# Patient Record
Sex: Male | Born: 1935
Health system: Southern US, Community
[De-identification: ages and names within clinical notes are randomized; demographics above are authoritative.]

## PROBLEM LIST (undated history)

## (undated) DIAGNOSIS — I639 Cerebral infarction, unspecified: Secondary | ICD-10-CM

## (undated) DIAGNOSIS — I10 Essential (primary) hypertension: Secondary | ICD-10-CM

## (undated) DIAGNOSIS — G4730 Sleep apnea, unspecified: Secondary | ICD-10-CM

## (undated) DIAGNOSIS — N189 Chronic kidney disease, unspecified: Secondary | ICD-10-CM

## (undated) DIAGNOSIS — E785 Hyperlipidemia, unspecified: Secondary | ICD-10-CM

## (undated) DIAGNOSIS — M1990 Unspecified osteoarthritis, unspecified site: Secondary | ICD-10-CM

## (undated) DIAGNOSIS — I499 Cardiac arrhythmia, unspecified: Secondary | ICD-10-CM

## (undated) HISTORY — PX: OTHER SURGICAL HISTORY: SHX169

## (undated) HISTORY — DX: Cerebral infarction, unspecified: I63.9

## (undated) HISTORY — PX: BACK SURGERY: SHX140

---

## 2000-04-27 ENCOUNTER — Encounter: Payer: Self-pay | Admitting: Orthopedic Surgery

## 2000-04-27 ENCOUNTER — Ambulatory Visit (HOSPITAL_COMMUNITY): Admission: RE | Admit: 2000-04-27 | Discharge: 2000-04-27 | Payer: Self-pay | Admitting: Orthopedic Surgery

## 2000-06-04 ENCOUNTER — Ambulatory Visit (HOSPITAL_COMMUNITY): Admission: RE | Admit: 2000-06-04 | Discharge: 2000-06-04 | Payer: Self-pay | Admitting: *Deleted

## 2000-06-04 ENCOUNTER — Encounter: Payer: Self-pay | Admitting: *Deleted

## 2000-06-17 ENCOUNTER — Observation Stay (HOSPITAL_COMMUNITY): Admission: RE | Admit: 2000-06-17 | Discharge: 2000-06-18 | Payer: Self-pay | Admitting: Specialist

## 2000-06-17 ENCOUNTER — Encounter (INDEPENDENT_AMBULATORY_CARE_PROVIDER_SITE_OTHER): Payer: Self-pay | Admitting: Specialist

## 2000-06-17 ENCOUNTER — Encounter: Payer: Self-pay | Admitting: Specialist

## 2000-12-14 ENCOUNTER — Encounter: Payer: Self-pay | Admitting: Internal Medicine

## 2000-12-14 ENCOUNTER — Ambulatory Visit (HOSPITAL_COMMUNITY): Admission: RE | Admit: 2000-12-14 | Discharge: 2000-12-14 | Payer: Self-pay | Admitting: Internal Medicine

## 2004-04-30 ENCOUNTER — Ambulatory Visit (HOSPITAL_COMMUNITY): Admission: RE | Admit: 2004-04-30 | Discharge: 2004-04-30 | Payer: Self-pay | Admitting: Family Medicine

## 2004-06-25 ENCOUNTER — Ambulatory Visit: Payer: Self-pay | Admitting: Internal Medicine

## 2004-06-25 ENCOUNTER — Ambulatory Visit (HOSPITAL_COMMUNITY): Admission: RE | Admit: 2004-06-25 | Discharge: 2004-06-25 | Payer: Self-pay | Admitting: Internal Medicine

## 2004-10-29 ENCOUNTER — Emergency Department (HOSPITAL_COMMUNITY): Admission: EM | Admit: 2004-10-29 | Discharge: 2004-10-29 | Payer: Self-pay | Admitting: Emergency Medicine

## 2004-11-21 ENCOUNTER — Ambulatory Visit: Payer: Self-pay | Admitting: Orthopedic Surgery

## 2004-11-29 ENCOUNTER — Ambulatory Visit: Payer: Self-pay | Admitting: Orthopedic Surgery

## 2004-11-29 ENCOUNTER — Ambulatory Visit (HOSPITAL_COMMUNITY): Admission: RE | Admit: 2004-11-29 | Discharge: 2004-11-29 | Payer: Self-pay | Admitting: Orthopedic Surgery

## 2004-12-02 ENCOUNTER — Ambulatory Visit: Payer: Self-pay | Admitting: Orthopedic Surgery

## 2004-12-09 ENCOUNTER — Ambulatory Visit: Payer: Self-pay | Admitting: Orthopedic Surgery

## 2004-12-23 ENCOUNTER — Ambulatory Visit: Payer: Self-pay | Admitting: Orthopedic Surgery

## 2004-12-24 ENCOUNTER — Encounter (HOSPITAL_COMMUNITY): Admission: RE | Admit: 2004-12-24 | Discharge: 2005-01-08 | Payer: Self-pay | Admitting: Orthopedic Surgery

## 2005-01-15 ENCOUNTER — Encounter (HOSPITAL_COMMUNITY): Admission: RE | Admit: 2005-01-15 | Discharge: 2005-02-14 | Payer: Self-pay | Admitting: Orthopedic Surgery

## 2005-02-03 ENCOUNTER — Ambulatory Visit: Payer: Self-pay | Admitting: Orthopedic Surgery

## 2006-01-07 ENCOUNTER — Encounter: Admission: RE | Admit: 2006-01-07 | Discharge: 2006-01-07 | Payer: Self-pay | Admitting: Specialist

## 2006-03-11 ENCOUNTER — Ambulatory Visit (HOSPITAL_COMMUNITY): Admission: RE | Admit: 2006-03-11 | Discharge: 2006-03-12 | Payer: Self-pay | Admitting: Specialist

## 2009-11-15 ENCOUNTER — Ambulatory Visit (HOSPITAL_COMMUNITY): Admission: RE | Admit: 2009-11-15 | Discharge: 2009-11-15 | Payer: Self-pay | Admitting: Nephrology

## 2010-04-11 ENCOUNTER — Emergency Department (HOSPITAL_COMMUNITY): Payer: Medicare Other

## 2010-04-11 ENCOUNTER — Inpatient Hospital Stay (HOSPITAL_COMMUNITY): Payer: Medicare Other

## 2010-04-11 ENCOUNTER — Inpatient Hospital Stay (HOSPITAL_COMMUNITY)
Admission: EM | Admit: 2010-04-11 | Discharge: 2010-04-14 | DRG: 872 | Disposition: A | Discharge status: Home / Self Care | Payer: Medicare Other | Attending: Internal Medicine | Admitting: Internal Medicine

## 2010-04-11 DIAGNOSIS — W19XXXA Unspecified fall, initial encounter: Secondary | ICD-10-CM | POA: Diagnosis present

## 2010-04-11 DIAGNOSIS — E860 Dehydration: Secondary | ICD-10-CM | POA: Diagnosis present

## 2010-04-11 DIAGNOSIS — E039 Hypothyroidism, unspecified: Secondary | ICD-10-CM | POA: Diagnosis present

## 2010-04-11 DIAGNOSIS — S7000XA Contusion of unspecified hip, initial encounter: Secondary | ICD-10-CM | POA: Diagnosis present

## 2010-04-11 DIAGNOSIS — I129 Hypertensive chronic kidney disease with stage 1 through stage 4 chronic kidney disease, or unspecified chronic kidney disease: Secondary | ICD-10-CM | POA: Diagnosis present

## 2010-04-11 DIAGNOSIS — N390 Urinary tract infection, site not specified: Secondary | ICD-10-CM | POA: Diagnosis present

## 2010-04-11 DIAGNOSIS — A419 Sepsis, unspecified organism: Principal | ICD-10-CM | POA: Diagnosis present

## 2010-04-11 DIAGNOSIS — N179 Acute kidney failure, unspecified: Secondary | ICD-10-CM | POA: Diagnosis present

## 2010-04-11 DIAGNOSIS — N400 Enlarged prostate without lower urinary tract symptoms: Secondary | ICD-10-CM | POA: Diagnosis present

## 2010-04-11 DIAGNOSIS — E785 Hyperlipidemia, unspecified: Secondary | ICD-10-CM | POA: Diagnosis present

## 2010-04-11 DIAGNOSIS — G4700 Insomnia, unspecified: Secondary | ICD-10-CM | POA: Diagnosis present

## 2010-04-11 DIAGNOSIS — N189 Chronic kidney disease, unspecified: Secondary | ICD-10-CM | POA: Diagnosis present

## 2010-04-11 DIAGNOSIS — E119 Type 2 diabetes mellitus without complications: Secondary | ICD-10-CM | POA: Diagnosis present

## 2010-04-11 LAB — URINALYSIS, ROUTINE W REFLEX MICROSCOPIC
Bilirubin Urine: NEGATIVE
Glucose, UA: NEGATIVE mg/dL
Ketones, ur: NEGATIVE mg/dL
Nitrite: POSITIVE — AB
Specific Gravity, Urine: 1.01 (ref 1.005–1.030)
Urobilinogen, UA: 0.2 mg/dL (ref 0.0–1.0)

## 2010-04-11 LAB — CBC
HCT: 44.8 % (ref 39.0–52.0)
MCHC: 34.4 g/dL (ref 30.0–36.0)
MCV: 91.4 fL (ref 78.0–100.0)
RDW: 12.5 % (ref 11.5–15.5)

## 2010-04-11 LAB — DIFFERENTIAL
Eosinophils Absolute: 0 10*3/uL (ref 0.0–0.7)
Eosinophils Relative: 0 % (ref 0–5)
Lymphocytes Relative: 3 % — ABNORMAL LOW (ref 12–46)
Lymphs Abs: 0.5 10*3/uL — ABNORMAL LOW (ref 0.7–4.0)
Monocytes Absolute: 1.4 10*3/uL — ABNORMAL HIGH (ref 0.1–1.0)

## 2010-04-11 LAB — URINE MICROSCOPIC-ADD ON

## 2010-04-11 LAB — BASIC METABOLIC PANEL
BUN: 32 mg/dL — ABNORMAL HIGH (ref 6–23)
Calcium: 9.4 mg/dL (ref 8.4–10.5)
Creatinine, Ser: 3.08 mg/dL — ABNORMAL HIGH (ref 0.4–1.5)
GFR calc non Af Amer: 20 mL/min — ABNORMAL LOW (ref >60)
Glucose, Bld: 127 mg/dL — ABNORMAL HIGH (ref 70–99)

## 2010-04-12 LAB — HEMOGLOBIN A1C: Mean Plasma Glucose: 128 mg/dL — ABNORMAL HIGH (ref <117)

## 2010-04-12 LAB — COMPREHENSIVE METABOLIC PANEL
AST: 42 U/L — ABNORMAL HIGH (ref 0–37)
Albumin: 3.2 g/dL — ABNORMAL LOW (ref 3.5–5.2)
Chloride: 105 mEq/L (ref 96–112)
Creatinine, Ser: 2.84 mg/dL — ABNORMAL HIGH (ref 0.4–1.5)
GFR calc Af Amer: 27 mL/min — ABNORMAL LOW (ref >60)
Potassium: 3.7 mEq/L (ref 3.5–5.1)
Total Bilirubin: 1 mg/dL (ref 0.3–1.2)

## 2010-04-12 LAB — GLUCOSE, CAPILLARY: Glucose-Capillary: 44 mg/dL — CL (ref 70–99)

## 2010-04-12 LAB — MRSA PCR SCREENING: MRSA by PCR: NEGATIVE

## 2010-04-12 LAB — LACTIC ACID, PLASMA: Lactic Acid, Venous: 2.1 mmol/L (ref 0.5–2.2)

## 2010-04-12 LAB — TSH: TSH: 0.616 u[IU]/mL (ref 0.350–4.500)

## 2010-04-13 LAB — CBC
HCT: 37.6 % — ABNORMAL LOW (ref 39.0–52.0)
Hemoglobin: 12.7 g/dL — ABNORMAL LOW (ref 13.0–17.0)
MCHC: 33.8 g/dL (ref 30.0–36.0)
MCV: 91.7 fL (ref 78.0–100.0)
RDW: 12.8 % (ref 11.5–15.5)

## 2010-04-13 LAB — BASIC METABOLIC PANEL
CO2: 24 mEq/L (ref 19–32)
Chloride: 105 mEq/L (ref 96–112)
GFR calc Af Amer: 30 mL/min — ABNORMAL LOW (ref >60)
Glucose, Bld: 123 mg/dL — ABNORMAL HIGH (ref 70–99)
Potassium: 3.9 mEq/L (ref 3.5–5.1)
Sodium: 135 mEq/L (ref 135–145)

## 2010-04-13 LAB — GLUCOSE, CAPILLARY: Glucose-Capillary: 127 mg/dL — ABNORMAL HIGH (ref 70–99)

## 2010-04-13 LAB — DIFFERENTIAL
Eosinophils Relative: 0 % (ref 0–5)
Lymphocytes Relative: 7 % — ABNORMAL LOW (ref 12–46)
Lymphs Abs: 1 10*3/uL (ref 0.7–4.0)
Monocytes Absolute: 1.2 10*3/uL — ABNORMAL HIGH (ref 0.1–1.0)
Monocytes Relative: 8 % (ref 3–12)
Neutro Abs: 12.4 10*3/uL — ABNORMAL HIGH (ref 1.7–7.7)

## 2010-04-13 LAB — URINE CULTURE

## 2010-04-14 LAB — BASIC METABOLIC PANEL
CO2: 21 mEq/L (ref 19–32)
Calcium: 8.3 mg/dL — ABNORMAL LOW (ref 8.4–10.5)
Chloride: 104 mEq/L (ref 96–112)
Creatinine, Ser: 2.19 mg/dL — ABNORMAL HIGH (ref 0.4–1.5)
GFR calc Af Amer: 36 mL/min — ABNORMAL LOW (ref >60)
Glucose, Bld: 118 mg/dL — ABNORMAL HIGH (ref 70–99)
Sodium: 134 mEq/L — ABNORMAL LOW (ref 135–145)

## 2010-04-14 LAB — GLUCOSE, CAPILLARY: Glucose-Capillary: 123 mg/dL — ABNORMAL HIGH (ref 70–99)

## 2010-04-16 LAB — GLUCOSE, CAPILLARY
Glucose-Capillary: 130 mg/dL — ABNORMAL HIGH (ref 70–99)
Glucose-Capillary: 151 mg/dL — ABNORMAL HIGH (ref 70–99)
Glucose-Capillary: 115 mg/dL — ABNORMAL HIGH (ref 70–99)

## 2010-04-16 LAB — CULTURE, BLOOD (ROUTINE X 2): Culture: NO GROWTH

## 2010-05-01 NOTE — Discharge Summary (Signed)
NAME:  Curtis Jenkins, Curtis Jenkins              ACCOUNT NO.:  [id number]  MEDICAL RECORD NO.:  [id number]           PATIENT TYPE:  I  LOCATION:  A310                          FACILITY:  [facility]  PHYSICIAN:  Esther Bradstreet L. Conley Canal, MDDATE OF BIRTH:  1935-03-01  DATE OF ADMISSION:  04/11/2010 DATE OF DISCHARGE:  04/01/2012LH                              DISCHARGE SUMMARY   DISCHARGE DIAGNOSES: 1. Escherichia coli urinary tract infection. 2. Septic shock. 3. Hypertension. 4. Acute renal failure with probable chronic kidney disease. 5. Reported history of diabetes. 6. Recently diagnosed hypothyroidism. 7. Hyperlipidemia. 8. Hip contusion. 9. Chronic insomnia. 10.Reported history of benign prostatic hypertrophy. 11.Dehydration.  DISCHARGE MEDICATIONS: 1. Cephalexin 500 mg p.o. b.i.d. until gone to complete a 10-day     course of antibiotics total. 2. Stop Metformin. 3. Stop lisinopril/hydrochlorothiazide. 4. Stop Naprosyn. 5. Tylenol 650 mg p.o. q.4 h. p.r.n. pain or fever. 6. Hydrocodone/APAP 5/325 one p.o. q.6 h. p.r.n. pain, 20 were     dispensed with no refills. 7. Amitriptyline 100 mg p.o. nightly. 8. Lipitor 20 mg nightly. 9. Donepezil 10 mg nightly. 10.Metoprolol tartrate 100 mg a day. 11.Ambien 10 mg nightly. 12.Armour Thyroid 60 mg a day. 13.Fish oil capsules 1 capsule daily. 14.Vitamin D3 10,000 units daily. 15.Magnesium oxide 400 mg nightly. 16.Calcium 600 mg 2 tablets nightly. 17.Vitamin B12 1000 mcg daily. 18.Zinc 15 mg daily. 19.Fish oil daily. 20."Detox OTC" b.i.d. 21."Detox ultra OTC" b.i.d. 22.Probiotic daily. 23.Arimidex 24.Cherry extract OTC 2 tablets daily.  CONDITION:  Stable.  ACTIVITY:  Ad lib.  DIET:  Should be diabetic heart healthy with plenty of fluids.  CONSULTATIONS:  None.  PROCEDURES:  None.  FOLLOWUP:  With Dr. Orson Ape next week to check basic metabolic panel, blood pressure, and blood sugars.  CONSULTATIONS:  None.  PROCEDURES:   None.  LABORATORY DATA:  CBC on admission significant for a white blood cell count of 19,000 with 90% neutrophils, otherwise unremarkable.  Basic metabolic panel on admission significant for a BUN of 32, creatinine of 3.08.  At discharge, his BUN is 23, creatinine is 2.19.  Liver function tests significant for an SGOT of 42, total protein of 5.8, albumin 3.2, magnesium and phosphorus normal.  Hemoglobin A1c is 6.1.  Procalcitonin the day after admission was 8.72.  Lactic acid was ordered on admission, but was drawn after the day after admission which was 2.1.  TSH 0.616. Fractional excretion of sodium was less than 1%.  Urinalysis showed large blood, trace protein, positive nitrite, large leukocyte esterase, too numerous to count white cells, 11-20 red cells, many bacteria. Blood cultures preliminarily are negative.  Urine culture grew out greater than 100,000 colonies of pansensitive E. coli.  DIAGNOSTICS:  Left hip x-ray showed no acute osseous abnormalities.  CT of brain without contrast showed nothing acute.  Chest x-ray 1-view showed suboptimal inspiration, nothing acute.  EKG showed normal sinus rhythm with left axis deviation.  HISTORY AND HOSPITAL COURSE:  Please see H and P for details.  [name] is a 75 year old white male patient of Dr. Orson Ape who also gets some of his primary care from Carroll Kinds, nurse practitioner  in [place].  He presented with weakness.  He had a hard time getting out of the recliner.  He has a history of BPH according to him and he had been on some medication which he stopped on his own.  His temperature was 103.1 in the emergency room.  Initially, he was normotensive, but his blood pressure dropped into the 70s in the emergency room.  He was talkative and nontoxic appearing in the emergency room.  He had dry mucous membranes and an otherwise unremarkable exam.  He was fluid resuscitated and admitted to the emergency room.  He was started  on Rocephin.  Eventually, his blood pressure did improve with several liters of fluid.  His antihypertensives were held.  He was on an ACE inhibitor and thiazide combination prior to admission which was stopped. Also, he was on metformin which was stopped.  His creatinine certainly was elevated during this hospitalization, but in looking through e-chart he had a creatinine of 1.6-1.8 in 2008 and I suspect he has chronic kidney disease and is therefore not a good candidate for chronic metformin therapy.  He denies diabetes in fact, but his daughter reports that he was placed on metformin for what sounds like prediabetes.  He is on multiple herbal supplements and was also started on Armour Thyroid by this nurse practitioner as well.  It is unclear to me whether Ms. Worrell has also been in contact with the patient's primary care physician, Dr. Orson Ape.  The patient apparently had a near syncopal episode and fell and injured his hip.  He was able to bear weight, however, and had a negative x-ray.  His blood pressure improved.  His blood sugars remained in the 90 to low 100 range off any oral hypoglycemic agents.  His renal failure improved, but is not back to normal.  He also reported that he was taking Naprosyn at home which I have asked him to stop and I have given him a few Vicodin.  I have asked him to follow up with his primary care physician to see whether he can be restarted back on his ACE inhibitor, thiazide and/or metformin.  He has been requesting discharge since before he was even admitted and is currently medically stable.  He has stable vital signs and is feeling much better.  His urine culture is back and labs are improving.  Total time on the day of discharge is greater than 30 minutes.     Amariss Detamore L. Conley Canal, MD     CLS/MEDQ  D:  04/14/2010  T:  04/15/2010  Job:  [id number]  cc:   Leonides Grills, M.D. Fax: [phone]  Tammy A. Leota Jacobsen, NP [id number] [place], [place] [id number]  Electronically Signed by Doree Barthel MD on 05/01/2010 09:33:47 PM

## 2010-05-01 NOTE — H&P (Signed)
NAME:  Curtis Jenkins, Curtis Jenkins              ACCOUNT NO.:  [id number]  MEDICAL RECORD NO.:  [id number]           PATIENT TYPE:  I  LOCATION:  IC10                          FACILITY:  [facility]  PHYSICIAN:  Eastin Swing L. Conley Canal, MDDATE OF BIRTH:  1935/11/09  DATE OF ADMISSION:  04/11/2010 DATE OF DISCHARGE:  LH                             HISTORY & PHYSICAL   CHIEF COMPLAINT:  Weakness.  HISTORY OF PRESENT ILLNESS:  Curtis Jenkins is a 75 year old white male who presents to the emergency room with weakness.  He had a very difficult time getting out of his recliner this morning.  When he was urinating, he almost passed out in the bathroom and hit his hip.  The daughter reports that he seemed a little confused as well.  He denies any flank pain, abdominal pain, nausea, vomiting and diarrhea.  He has no cough. He denies fevers or chills, but had a temperature of 103.1 in the emergency room.  He reports chronic urinary incontinence and has a history of an enlarged prostate.  He is complaining of some left hip pain and initially he had a difficult time moving his left hip.  He initially had a blood pressure of 118/61, but it since dropped into the 70s and 80s.  I have asked the nurse to give a liter of saline and his blood pressure is currently 93/40 or so.  He has no other complaints.  PAST MEDICAL HISTORY:  Hyperlipidemia, hypertension, recently diagnosed hypothyroidism, recently diagnosed diabetes, reported elevated blood levels.  MEDICATIONS: 1. Amitriptyline 100 mg nightly. 2. Lipitor 20 mg nightly. 3. Lisinopril/hydrochlorothiazide 20/12.5 mg twice a day. 4. Donepezil 10 mg nightly. 5. Metoprolol 50 mg 2 tablets daily. 6. Ambien 10 mg nightly. 7. Metformin 750 mg nightly. 8. Vitamin D3 10,000 units every morning. 9. Arimidex 1 mg p.o. twice a week. 10.Diflucan. 11.Armour Thyroid 60 mg daily. 12.Cherry extract aloe tablets DHEA 25 mg 2 tablets in the morning. 13.Probiotic citrus  q.10. 14.Calcium 600 mg twice a day. 15.Fish oil capsules 2000 mg a day. 16.Magnesium nightly several other medications and herbal remedies     that I cannot read on the list. 17.Milk thistle sublingual. 18.B12 daily. 19.Zinc daily. 20.Vitamin C 1000 mg a day. 21.Amino acids nightly. 22.L-glutathione plus sublingually daily "chelation detox 2 tablets     with food twice a day "detox ultra between meals.  ALLERGIES:  No known drug allergies.  SOCIAL HISTORY:  The patient is married.  He is here with his daughter. He denies drinking drugs smoking.  FAMILY HISTORY:  His mother had unknown type of cancer.  He does not know his father's history.  REVIEW OF SYSTEMS:  Systems reviewed and as above otherwise negative.  PHYSICAL EXAMINATION:  VITAL SIGNS:  Temperature is 103.1, blood pressure initially 118/61 and dropped into the 70s currently about 93/45, pulse 95, respiratory rate 20, oxygen saturation 97% on room air. GENERAL:  The patient is nontoxic-appearing white male, in no acute distress who is talkative and mentating fine.HEENT:  Normocephalic, atraumatic.  Pupils equal, round, reactive to light.  Dry mucous membranes. NECK:  Supple.  No carotid bruits.  LUNGS:  Clear to auscultation bilaterally without wheezes, rhonchi or rales. CARDIOVASCULAR:  Regular rate and rhythm without murmurs, gallops or rubs. BACK:  No CVA tenderness. ABDOMEN:  Soft, nontender and nondistended. GU:  Deferred. RECTAL:  Deferred. EXTREMITIES:  No clubbing, cyanosis or edema.  His feet are warm. Pulses are intact. NEUROLOGIC:  Alert and oriented.  Cranial nerves and sensorimotor exam are intact. PSYCHIATRIC:  Normal affect.  LABORATORY DATA:  White blood cell count is 19,700 with 90% neutrophils. CBC is otherwise unremarkable.  Basic metabolic panel significant for a BUN of 32, creatinine 3.08.  Urinalysis shows large blood, trace protein, nitrates positive, large leukocyte esterase, too  numerous to count white cells, 11-20 red cells, many bacteria.  Urine cultures and blood cultures are pending.  His creatinine in 2008 was 1.63.  EKG shows normal sinus rhythm with left axis deviation.  CT of the brain shows nothing acute.  Chest x-ray shows suboptimal inspiration nothing acute.  ASSESSMENT AND PLAN: 1. Urinary tract infection with sepsis:  The patient will be admitted     to the Intensive Care Unit.  He is on his third liter of saline and     his blood pressures improved.  I will check a venous lactic acid     and procalcitonin level.  Despite low blood pressure, patient is     rather asymptomatic.  He has received Rocephin which I will     continue.  I will hold his antihypertensives and follow up culture     results. 2. Hypertension.  See above. 3. Acute renal failure with probable chronic kidney disease.  I have     no recent labs for comparison.  Hold his lisinopril,     hydrochlorothiazide and metformin. 4. Diabetes.  Check hemoglobin A1c and monitor blood glucoses off     metformin.  He may need to stay off metformin completely. 5. Recently diagnosed hypothyroidism.  I will check a TSH. 6. Hyperlipidemia. 7. Hip pain after a fall.  I will check an x-ray to rule out fracture.     He has full range of motion and no tenderness to palpation.  Total critical care time is 60 minutes.     Luz Burcher L. Conley Canal, MD     CLS/MEDQ  D:  04/11/2010  T:  04/12/2010  Job:  [id number]  cc:   Leonides Grills, M.D. Fax: [phone]  Carroll Kinds, N.P.  Electronically Signed by Doree Barthel MD on 05/01/2010 09:33:51 PM

## 2010-05-08 ENCOUNTER — Ambulatory Visit (INDEPENDENT_AMBULATORY_CARE_PROVIDER_SITE_OTHER): Payer: Medicare Other | Admitting: Urology

## 2010-05-08 DIAGNOSIS — R972 Elevated prostate specific antigen [PSA]: Secondary | ICD-10-CM

## 2010-05-31 NOTE — Op Note (Signed)
[facility]  Patient:    Curtis Jenkins, Curtis Jenkins                     MRN: [id number] Proc. Date: 06/17/00 Adm. Date:  [id number] Attending:  Tye Savoy                           Operative Report  PREOPERATIVE DIAGNOSES:  Spinal stenosis, herniated nucleus pulposus L5-S1.  POSTOPERATIVE DIAGNOSES:  Spinal stenosis, herniated nucleus pulposus L5-S1.  OPERATION PERFORMED:  Bilateral hemilaminotomy, microdiskectomy, bilateral foraminotomy L5-S1.  ANESTHESIA:  General.  ASSISTANT:  Dr. Gladstone Lighter.  BRIEF HISTORY:  This is a 75 year old male with bilateral radicular pain secondary to large H&P, L5-S1 migrating caudad behind the S1 vertebral body. The patient had a bilateral neuroforaminal stenosis at L5. Operative intervention was indicated for decompression of the S1  and 5 nerve roots bilaterally as well as S2 on the right. The patient had mild lateral recessed stenosis at 4-5 and a grade 1 listhesis at L3-4 which was considered chronic and had minimal symptoms related to that. Operative intervention was indicated for the above decompression. Risks and benefits discussed including bleeding, infection, damage to neurovascular structures, CSF leakage, epidural fibrosis, recurrent disk herniation, need for fusion in the future, etc.  TECHNIQUE:  The patient in supine position after an adequate level of GENERAL: anesthesia and 1 gm of Kefzol IV for antimicrobial prophylaxis. The patient was placed prone on the [place] frame, all bony prominences well padded. The lumbar region was prepped and draped in the usual sterile fashion. Two 18 gauge spinal needles were utilized to localize the L5-S1 interspace confirmed with x-ray. An incision was made from the spinous process of 5 to S1. The subcutaneous tissue was dissected, electrocautery was utilized to achieve hemostasis. The dorsolumbar fascia was identified and divided in line with the skin incision. The  paraspinous muscle was elevated from the lamina of 5 and S1 bilaterally. There was significant central disk herniation noted that was felt to proceed centrally here. The interspinous ligament was removed. Next hemilaminotomy was performed at L5 bilaterally. The ligamentum flavum was detached on the cephalad edge of S1. A wide hemilaminotomy of S1 bilaterally was performed with a 3 and a 4 mm Kerrison and S1 foraminotomies were performed as well. L5 foraminotomies were performed as well as stenosis noted left greater than right. Secondary to the superior articular facet of S1 and ligamentum flavum. The foraminotomies and the central decompression of the thecal sac was gently mobilized medially from the right. This disk herniation was not noted here at the disk space. It had migrated caudad. In the axilla of the S1 nerve root, the large HNP was noted. This was meticulously and gently manipulated from beneath the thecal sac and underneath the axilla the S1 nerve root had migrated down to behind the vertebral body of S1. The dura was not retracted across the midline or significantly at any point. The ligamentum flavum has been removed from the interspace as well. Three to four fragments were then mobilized here from the axilla and retrieved with the micropituitary. Next, on the contralateral side, the S1 nerve root was gently mobilized medially and a fragment was also retrieved from caudad to the disk space on the left side at L5-S1 again behind the vertebral body of S1. Prior to this, there was significant stenosis noted secondary to this large central disk herniation that migrated caudad. It was mainly  though central to the right. Following gentle manipulation of the fragment from the beneath the thecal sac, there was satisfactory decompression on both sides of the 5 and S1 roots. A hockey stick probe placed through the axilla bilaterally at S1 found to be widely patent also in the foramen of  S1 bilaterally and the foramen of 5. Bipolar electrocautery was utilized to achieve meticulous hemostasis. The wound was then copiously irrigated and inspection revealed pulsatile ______. No evidence of CSF leakage or active bleeding. Confirmatory radiograph was also obtained with a ______ on the spinous process of 5. Again the wound was copiously irrigated and this had been performed with the operating microscope which had been draped and brought onto the surgical field. Thrombin soaked Gelfoam was then placed over the laminotomy defect. The McCullough retractor was removed. The paraspinous muscles inspected with no evidence of active bleeding. The wound was copiously irrigated again. The dorsal lumbar fascia reapproximated with #1 Vicryl in figure-of-eight sutures. Subcutaneous tissue reapproximated with 2-0 Vicryl simple sutures, skin is reapproximated with staples. The wound is dressed sterilely. The patient is placed supine on the hospital bed, extubated without difficulty and transported to the recovery room in satisfactory condition.  The patient tolerated the procedure well with no operative complications.  ESTIMATED BLOOD LOSS:  100 cc. DD:  06/17/00 TD:  06/18/00 Job: [id number]

## 2010-05-31 NOTE — H&P (Signed)
[facility]  Patient:    Curtis Jenkins, Curtis Jenkins                       MRN: [id number] Adm. Date:  06/17/00 Attending:  Johnn Hai, M.D. Dictator:   Charlott Rakes, P.A.                         History and Physical  CHIEF COMPLAINT:  Bilateral lower extremity radicular pain.  HISTORY OF PRESENT ILLNESS:  The patient is a 75 year old white male who has had pain radiating into both lower extremities, right greater than left, since March of 2002.  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  He was initially seen by his family physician who obtained an MRI which demonstrated a large disk herniation at L5-S1 to the right of midline extending inferiorly and displacing the right S1 nerve root and abutting the S2 nerve root.  There was also lateral recess stenosis noted to L5-S1 on the left at this level.  Moderate lateral recess stenosis was noted at L4-5 and L3-4 grade 1 listhesis was noted.  The majority of his pain remains in his buttocks and posterior legs and thighs.  He has had no change in his bowel or bladder function.  Due to his continued symptoms of pain and dysfunction as well as his significant findings, it was felt he would require surgical intervention and is being admitted at this time to undergo bilateral microdiskectomy, L5-S1, with foraminotomy at L5.  ALLERGIES:  No known drug allergies.  CURRENT MEDICATIONS: 1. Bextra 10 mg daily. 2. Ambien 10 mg q.h.s. 3. Zestoretic 10/12.5 mg q.d.  PAST MEDICAL HISTORY:  Hypertension and insomnia.  Patient gives a remote history of spinal meningitis in 1957.  PAST SURGICAL HISTORY:  Patient has had no previous surgery.  SOCIAL HISTORY:  Patient is positive for chewing tobacco, however, does not smoke cigarettes.  He has no intake of alcohol.  He currently lives with his wife.  PRIMARY  CARE PHYSICIAN:  His family medical doctor is Dr. Ledon Snare, who has given him surgical clearance for the procedure.  FAMILY HISTORY:  Patients mother died with bone cancer.  Patients fathers medical history was unknown.  REVIEW OF SYSTEMS:  CNS:  Patient denies blurred vision, double vision, seizure disorder, headaches or paralysis.  CARDIORESPIRATORY:  No chest pain, shortness of breath, cough or sputum production or hemoptysis.  GU/GI:  No nausea, vomiting, diarrhea or constipation.  No dysuria, hematuria, melena or bloody stools.  MUSCULOSKELETAL:  As per history of present illness. HEMATOLOGIC:  Patient denies history of jaundice, hepatitis, anemia, bleeding tendencies or blood clots.  PHYSICAL EXAMINATION:  VITAL SIGNS:  Pulse 80 and regular, blood pressure 170/100 and repeated at 150/100.  GENERAL:  Patient is a well-developed, well-nourished white male, alert and oriented x 4, in no acute distress.  HEENT:  Normocephalic, atraumatic.  Pupils equal, round and reactive to light and accommodation.  Extraocular movements intact.  Nose without drainage. Oropharynx without edema or erythema.  NECK:  Supple.  No adenopathy or thyromegaly.  LUNGS:  Clear to auscultation.  HEART:  Regular rate and rhythm.  No murmur heard.  ABDOMEN:  Soft, nontender.  Bowel sounds present.  GENITAL:  Not performed, not pertinent to present illness.  RECTAL:  Not performed, not pertinent  to present illness.  BREASTS:  Not performed, not pertinent to present illness.  EXTREMITIES:  Patient has a slightly antalgic gait.  He is tender in the lumbosacral junction and PSIS.  He has some discomfort at end-extension as well as end-forward flexion.  Straight leg raise is positive on the right, reproducing buttock and posterior thigh and calf pain, exacerbated with dorsal augmentation maneuver.  Straight leg raise on the left produces buttock pain. Distally in the lower extremities,  pulses are +2 at dorsalis pedis bilaterally.  Sensation is intact.  He has diminished repetitive plantar reflex on the right.  Deep tendon reflexes are +1 at the knee and ankle bilaterally.  There is no cyanosis, clubbing or edema noted in the lower extremities.  IMPRESSION: 1. Herniated nucleus pulposus, L5-S1, central, with spinal stenosis. 2. Hypertension. 3. Insomnia.  PLAN:  Patient will be admitted to the hospital to undergo bilateral microdiskectomy, L5-S1, with foraminotomy at L5.  He has been seen by Dr. Maude Leriche of [facility] and cleared for his surgical intervention. DD:  06/16/00 TD:  06/17/00 Job: [id number]

## 2010-05-31 NOTE — Op Note (Signed)
NAME:  Curtis Jenkins, Curtis Jenkins NO.:  [id number]   MEDICAL RECORD NO.:  [id number]          PATIENT TYPE:  AMB   LOCATION:  DAY                          FACILITY:  [facility]   PHYSICIAN:  Susa Day, M.D.    DATE OF BIRTH:  [birth year]   DATE OF PROCEDURE:  03/11/2006  DATE OF DISCHARGE:                               OPERATIVE REPORT   PREOPERATIVE DIAGNOSIS:  Spinal stenosis, L4-5.   POSTOPERATIVE DIAGNOSIS:  Spinal stenosis, L4-5, stenosis at 5-1.   OPERATION PERFORMED:  1. Central decompression L4-5 with bilateral lateral recess      decompression.  2. Hemilaminectomy, L5 right.  3. Redo decompression, L5-S1, right with foraminotomy of S1.   SURGEON:  Susa Day, M.D.   ASSISTANT:  Rometta Emery, P.A.   ANESTHESIA:  General.   INDICATIONS FOR PROCEDURE:  75 year old refractory predominantly right  lower extremity radicular pain, L5 nerve root distribution.  Myelogram  indicating severe spinal stenosis at 3-4.  He had a grade 1  spondylolisthesis at 3-4 without evidence of neural compression.  He had  lateral recess stenosis at 5-1.  Indicated for decompression centrally  at 4-5 with foraminotomy of 5 to decompress the 5 root and possible  adjacent segment.  The risks and benefits discussed including bleeding,  infection, damage to neurovascular structures, CSF leakage, epidural  fibrosis, adjacent segment disease, need for fusion in future,  anesthetic complications, etc.   DESCRIPTION OF PROCEDURE:  Patient supine position after induction of  general anesthesia 1 g Kefzol.  He was placed prone on Andrews frame.  All bony prominences well padded.  Lumbar region was prepped and draped  in sterile fashion, two 18 gauge spinal needles were utilized to  localize the 4-5 interspace and confirmed with x-ray.  Incisions were  made from the spinous process of 4 to S1.  Subcutaneous tissue  dissected.  Electrocautery utilized to achieve hemostasis.  Dorsolumbar  fascia identified and divided in line with skin incision.  Paraspinous  muscle elevated from lamina of 4 and 5 and 5-1 on the right.  Cobra  retractor was placed.  Penfield 4 in the interlaminar space, confirmed  with x-ray.  Operating microscope was draped and brought into the  surgical field.  Performed central decompression with Leksell  removing  the portion of the spinous processes at 4 and 5 with Leksell rongeur and  then performed hemilaminotomy of the caudad edge of 4 utilizing a 2 and  3 mm Kerrisons to detach ligamentum flavum.  Hypertrophic ligamentum  flavum was noted centrally into the lateral recess.  It was detached  from the cephalad edge of 5 as well bilaterally.  With the neural  elements well protected, we decompressed the lateral recess bilaterally  to the medial border of the pedicles.  Significant compression was noted  on the right, particularly at medial and lateral recess compressing the  5 root.  Performed generous foraminotomy.  It was apparent that this  continued into the 5-1 space and therefore had to remove the hemilamina  of 5, encountered fibrosis from previous surgery, skeletonized the  lateral recess with  a curette, freed the S1 nerve root and performed a  foraminotomy of 5 utilizing operating microscope.  There was a hard disk  noted at 5-1.  After performing the foraminotomy of 5 and S1 and  centrally we examined the 4 root. The 4 foramen.  We performed a  foraminotomy of 4 on the right.  It was open on the left.  Following  this, and there was no disk herniation at 4-5 noted.  Bipolar  electrocautery utilized to achieve hemostasis.  Following this, there  was good mobility of the 5 root bilaterally and the S1 root on the  right.  Wound copiously irrigated.  Thrombin soaked Gelfoam placed in  laminotomy defect.  Inspection prior to that revealed no evidence of CSF  leakage or active bleeding.  Felt this was adequately decompressed and  cephalad  underneath the 4 lamina, we could probe up to the pedicle of  above 4 without compression noted.  Cobra retractor was removed.  Paraspinous muscles inspected.  Cautery was utilized to achieve  hemostasis.  Dorsolumbar fascia reapproximated with #1 Vicryl and  interrupted figure-of-eight sutures.  Subcutaneous tissue reapproximated  with 2-0 Vicryl simple sutures.  Skin was reapproximated with staples.  Wound was dressed sterilely.  She was placed supine on her hospital bed,  extubated without difficulty and transported to recovery in satisfactory  condition.   The patient tolerated the procedure well.  No complications.      Susa Day, M.D.  Electronically Signed     JB/MEDQ  D:  03/11/2006  T:  03/11/2006  Job:  [id number]

## 2010-05-31 NOTE — Op Note (Signed)
NAME:  Curtis Jenkins, Curtis Jenkins              ACCOUNT NO.:  [id number]   MEDICAL RECORD NO.:  [id number]          PATIENT TYPE:  AMB   LOCATION:  DAY                           FACILITY:  [facility]   PHYSICIAN:  R. Garfield Cornea, M.D. DATE OF BIRTH:  [birth year]   DATE OF PROCEDURE:  06/25/2004  DATE OF DISCHARGE:                                 OPERATIVE REPORT   PROCEDURE:  Colonoscopy.   INDICATIONS FOR PROCEDURE:  The patient is a 75 year old gentleman sent over  at the courtesy of Dr. Dillard Cannon for colorectal cancer screening. He is  devoid of any lower GI tract symptoms. No family history of colorectal  neoplasia. He has never had a colonoscopy. Colonoscopy is now being done as  a standard screening maneuver. Potential risks, benefits, and alternatives  have been reviewed and questions answered. He is agreeable. Please see  documentation in the medical record for more information.   PROCEDURE NOTE:  O2 saturation, blood pressure, pulse, and respirations were  monitored throughout the entire procedure. Conscious sedation with Versed 4  mg IV and Demerol 100 mg IV in divided doses.   INSTRUMENT:  Olympus video chip system.   FINDINGS:  Digital rectal exam revealed no abnormalities.   ENDOSCOPIC FINDINGS:  Prep was good.   Rectum:  Examination of the rectal mucosa including retroflexed view of the  anal verge revealed no abnormalities.   Colon:  Colonic mucosa was surveyed from the rectosigmoid junction through  the left, transverse, and right colon to the area of the appendiceal  orifice, ileocecal valve, and cecum. These structures were well seen and  photographed for the record. From this level, the scope was slowly  withdrawn, and all previously mentioned mucosal surfaces were again seen.  The colonic mucosa appeared entirely normal. The patient tolerated the  procedure well and was reactive to endoscopy.   IMPRESSION:  1.  Normal rectum.  2.  Normal colon.   RECOMMENDATIONS:   Repeat screening colonoscopy in 10 years.       RMR/MEDQ  D:  06/25/2004  T:  06/25/2004  Job:  [id number]   cc:   Leonides Grills, M.D.  [address] [place] [address]  Fax: [phone]

## 2010-05-31 NOTE — Op Note (Signed)
NAME:  Curtis Jenkins, Curtis Jenkins NO.:  [id number]   MEDICAL RECORD NO.:  [id number]          PATIENT TYPE:  AMB   LOCATION:  DAY                           FACILITY:  [facility]   PHYSICIAN:  Carole Civil, M.D.DATE OF BIRTH:  1935/06/18   DATE OF PROCEDURE:  11/29/2004  DATE OF DISCHARGE:                                 OPERATIVE REPORT   HISTORY:  This is a 75 year old male, injured his knee October of 2006, went  to the emergency room. He was placed in a brace, given some crutches. He did  not think he needed to follow-up and did not until early November. Clinical  exam revealed obvious quadriceps tendon rupture, left knee, and he was  scheduled for surgery.   PREOPERATIVE DIAGNOSIS:  Left quadriceps tendon rupture.   POSTOPERATIVE DIAGNOSIS:  Left quadriceps tendon rupture.   PROCEDURE:  Repair left quadriceps tendon.   OPERATIVE FINDINGS:  Torn left quadriceps tendon.   SURGEON:  Dr. Aline Brochure, no assistants.   ANESTHETIC:  Spinal.   SPECIMENS:  No specimens.   BLOOD LOSS:  Minimal.   COMPLICATIONS:  None. Counts were correct at end of procedure. The patient  went to PACU in stable condition.   This patient was identified as Curtis Jenkins. His left knee was marked as  the surgical site and countersigned by the surgeon. History and physical was  updated, and antibiotics were started. He was taken to the operating room  for spinal anesthetic. His left lower extremity was prepped and draped using  sterile technique using a DuraPrep solution   Time-out was taken and completed as required. Tourniquet was elevated to 300  mmHg where it stayed for 45 minutes. A straight incision was made and  centered over the patella, extended proximally. Subcutaneous tissue was  divided. Quadriceps defect was palpable as I easily visualized, palpated,  and extended from the medial to the lateral retinaculum.   Two #5 Tycron sutures were passed through the tendon and then  passed through  the patella and tied over the patellar tendon. The defect was then over  sewed with a #2 Ethibond suture. This gave excellent reapproximation of the  tendon to the patella, and no stress was placed on the suture line until  approximately 45 degrees of flexion.   The patient's wound was closed with 2-0 Monocryl and staples. Tourniquet was  released. Limb looked very good in terms of capillary refill. Sterile  dressings, CryoCuff and brace were applied. He was taken recovery in stable  condition.   POSTOPERATIVE PLAN:  I like to keep these patients in the brace for  approximately three weeks, then start range of motion 0 to 45, progress as  tolerated in a range of motion brace. He can weight bear as tolerated.  Follow with me on Monday. He is discharged on Lorcet Plus 1 q.4h. p.r.n. for  pain #60.      Carole Civil, M.D.  Electronically Signed     SEH/MEDQ  D:  11/29/2004  T:  11/29/2004  Job:  [id number]

## 2010-05-31 NOTE — H&P (Signed)
NAME:  Curtis Jenkins, Curtis Jenkins NO.:  [id number]   MEDICAL RECORD NO.:  [id number]          PATIENT TYPE:  AMB   LOCATION:  DAY                           FACILITY:  [facility]   PHYSICIAN:  Carole Civil, M.D.DATE OF BIRTH:  11/09/1935   DATE OF ADMISSION:  11/28/2004  DATE OF DISCHARGE:  LH                                HISTORY & PHYSICAL   CHIEF COMPLAINT:  Left knee pain.   HISTORY OF PRESENT ILLNESS:  This is a 75 year old who injured his knee in  October, approximately on the 17th.  He was placed in a brace and on  crutches and had an appointment to follow up here and did not think he  needed it.  Finally, he took his brace off and found that he could not walk  and came to the office on November 21, 2004.  He has a left quadriceps tendon  rupture and he will require surgery.  I explained to him that his surgery is  complicated by his delay in treatment.  He will need at least 12 weeks of  rehabilitation.  He will be in a brace.   REVIEW OF SYSTEMS:  MUSCULOSKELETAL:  Joint swelling, joint pain.  HEENT:  Ears ringing.   ALLERGIES:  No known drug allergies.   PAST MEDICAL HISTORY:  1.  Hypertension.  2.  Arthritis.  3.  Obesity.  4.  Previous surgery on his back.   MEDICATIONS:  Naproxen, Toprol, lisinopril, Lipitor, Aricept, amitriptyline  and Ambien.   FAMILY HISTORY:  Cancer.   PRIMARY CARE PHYSICIAN:  [facility].   SOCIAL HISTORY:  He is married.  He does renal maintenance.  He does not  smoke or drink.  Caffeine use described as 2 cups of coffee, 6-8 sodas.  Highest grade completed 8.   PHYSICAL EXAMINATION:  VITAL SIGNS:  Weight 260, pulse 84, respirations 18.  GENERAL:  Normal.  CARDIAC:  Mild varicose veins.  Good perfusion.  NEUROLOGIC:  Normal sensation.  No radicular symptoms or signs.  Reflexes  good.  Coordination good.  Left knee reflex deferred.  Alert, awake and  oriented x3.  SKIN:  Intact.  EXTREMITIES:  He has a palpable  defect to his patella.  It is tender and  painful.  He cannot do a straight leg raise.  He has painful range of motion  in the left knee.  He has not developed contracture at this point.  He may  have had some decreased motion secondary to osteoarthritis.  His other  extremities are normally developed and aligned.  No contractures,  subluxation, atrophy or tremor.   ASSESSMENT:  Left quadriceps tendon rupture.   PLAN:  Left quadriceps repair.      Carole Civil, M.D.  Electronically Signed     SEH/MEDQ  D:  11/28/2004  T:  11/28/2004  Job:  [id number]   cc:   [facility]

## 2010-11-22 ENCOUNTER — Ambulatory Visit (INDEPENDENT_AMBULATORY_CARE_PROVIDER_SITE_OTHER): Payer: Medicare Other | Admitting: Urology

## 2010-11-22 DIAGNOSIS — N401 Enlarged prostate with lower urinary tract symptoms: Secondary | ICD-10-CM

## 2010-11-22 DIAGNOSIS — R972 Elevated prostate specific antigen [PSA]: Secondary | ICD-10-CM

## 2011-02-28 ENCOUNTER — Ambulatory Visit (INDEPENDENT_AMBULATORY_CARE_PROVIDER_SITE_OTHER): Payer: Medicare Other | Admitting: Urology

## 2011-02-28 DIAGNOSIS — N401 Enlarged prostate with lower urinary tract symptoms: Secondary | ICD-10-CM

## 2011-02-28 DIAGNOSIS — R972 Elevated prostate specific antigen [PSA]: Secondary | ICD-10-CM

## 2011-03-04 ENCOUNTER — Ambulatory Visit: Payer: Medicare Other | Admitting: Urology

## 2013-09-16 ENCOUNTER — Ambulatory Visit (INDEPENDENT_AMBULATORY_CARE_PROVIDER_SITE_OTHER): Payer: Medicare Other | Admitting: Urology

## 2013-09-16 DIAGNOSIS — N401 Enlarged prostate with lower urinary tract symptoms: Secondary | ICD-10-CM

## 2013-09-16 DIAGNOSIS — R972 Elevated prostate specific antigen [PSA]: Secondary | ICD-10-CM

## 2013-10-11 ENCOUNTER — Other Ambulatory Visit: Payer: Self-pay | Admitting: Urology

## 2013-10-11 DIAGNOSIS — R972 Elevated prostate specific antigen [PSA]: Secondary | ICD-10-CM

## 2013-10-25 ENCOUNTER — Other Ambulatory Visit: Payer: Self-pay | Admitting: Urology

## 2013-10-25 DIAGNOSIS — R972 Elevated prostate specific antigen [PSA]: Secondary | ICD-10-CM

## 2013-11-04 ENCOUNTER — Ambulatory Visit (HOSPITAL_COMMUNITY)
Admission: RE | Admit: 2013-11-04 | Discharge: 2013-11-04 | Disposition: A | Discharge status: Home / Self Care | Payer: Medicare Other | Source: Ambulatory Visit | Attending: Urology | Admitting: Urology

## 2013-11-04 VITALS — BP 148/100 | HR 60 | Temp 99.1°F | Resp 16

## 2013-11-04 DIAGNOSIS — R972 Elevated prostate specific antigen [PSA]: Secondary | ICD-10-CM

## 2013-11-04 DIAGNOSIS — N423 Dysplasia of prostate: Secondary | ICD-10-CM | POA: Insufficient documentation

## 2013-11-04 DIAGNOSIS — C61 Malignant neoplasm of prostate: Secondary | ICD-10-CM | POA: Diagnosis not present

## 2013-11-04 MED ORDER — LIDOCAINE HCL (PF) 2 % IJ SOLN
INTRAMUSCULAR | Status: AC
  Administered 2013-11-04: 10 mL
  Filled 2013-11-04: qty 10

## 2013-11-04 MED ORDER — CEFTRIAXONE SODIUM 1 G IJ SOLR
INTRAMUSCULAR | Status: AC
  Filled 2013-11-04: qty 10

## 2013-11-04 MED ORDER — LIDOCAINE HCL (PF) 1 % IJ SOLN
INTRAMUSCULAR | Status: AC
  Administered 2013-11-04: 5 mL
  Filled 2013-11-04: qty 5

## 2013-11-04 MED ORDER — CEFTRIAXONE SODIUM 1 G IJ SOLR
1.0000 g | Freq: Once | INTRAMUSCULAR | Status: AC
  Administered 2013-11-04: 1 g via INTRAMUSCULAR

## 2013-11-04 NOTE — Discharge Instructions (Signed)
Transrectal Ultrasound-Guided Biopsy °A transrectal ultrasound-guided biopsy is a procedure to remove samples of tissue from your prostate using ultrasound images to guide the procedure. The procedure is usually done to evaluate the prostate gland of men who have an elevated prostate-specific antigen (PSA). PSA is a blood test to screen for prostate cancer. The biopsy samples are taken to check for prostate cancer.  °LET YOUR HEALTH CARE PROVIDER KNOW ABOUT: °· Any allergies you have. °· All medicines you are taking, including vitamins, herbs, eye drops, creams, and over-the-counter medicines. °· Previous problems you or members of your family have had with the use of anesthetics. °· Any blood disorders you have. °· Previous surgeries you have had. °· Medical conditions you have. °RISKS AND COMPLICATIONS °Generally, this is a safe procedure. However, as with any procedure, problems can occur. Possible problems include: °· Infection of your prostate. °· Bleeding from your rectum or blood in your urine. °· Difficulty urinating. °· Nerve damage (this is usually temporary). °· Damage to surrounding structures such as blood vessels, organs, and muscles, which would require other procedures. °BEFORE THE PROCEDURE °· Do not eat or drink anything after midnight on the night before the procedure or as directed by your health care provider. °· Take medicines only as directed by your health care provider. °· Your health care provider may have you stop taking certain medicines 5-7 days before the procedure. °· You will be given an enema before the procedure. During an enema, a liquid is injected into your rectum to clear out waste. °· You may have lab tests the day of your procedure.   °· Plan to have someone take you home after the procedure. °PROCEDURE  °· You will be given medicine to help you relax (sedative) before the procedure. An IV tube will be inserted into one of your veins and used to give fluids and  medicine. °· You will be given antibiotic medicine to reduce the risk of an infection. °· You will be placed on your side for the procedure. °· A probe with lubricated gel will be placed into your rectum, and images will be taken of your prostate and surrounding structures. °· Numbing medicine will be injected into the prostate before the biopsy samples are taken. °· A biopsy needle will then be inserted and guided to your prostate with the use of the ultrasound images. °· Samples of prostate tissue will be taken, and the needle will then be removed. °· The biopsy samples will be sent to a lab to be analyzed. Results are usually back in 2-3 days. °AFTER THE PROCEDURE °· You will be taken to a recovery area where you will be monitored. °· You may have some discomfort in the rectal area. You will be given pain medicines to control this. °· You may be allowed to go home the same day, or you may need to stay in the hospital overnight. °Document Released: 05/16/2013 Document Reviewed: 08/18/2012 °[employer]® Patient Information ©2015 [employer]. This information is not intended to replace advice given to you by your health care provider. Make sure you discuss any questions you have with your health care provider. ° °

## 2013-11-04 NOTE — Progress Notes (Deleted)
Paracentesis complete no signs of distress. 3800 ml amber colored ascites removed.

## 2013-11-18 ENCOUNTER — Institutional Professional Consult (permissible substitution) (INDEPENDENT_AMBULATORY_CARE_PROVIDER_SITE_OTHER): Payer: Medicare Other | Admitting: Urology

## 2013-11-18 DIAGNOSIS — R351 Nocturia: Secondary | ICD-10-CM

## 2013-11-18 DIAGNOSIS — C61 Malignant neoplasm of prostate: Secondary | ICD-10-CM

## 2013-11-18 DIAGNOSIS — N401 Enlarged prostate with lower urinary tract symptoms: Secondary | ICD-10-CM

## 2014-02-09 DIAGNOSIS — C61 Malignant neoplasm of prostate: Secondary | ICD-10-CM | POA: Diagnosis not present

## 2014-02-17 ENCOUNTER — Ambulatory Visit (INDEPENDENT_AMBULATORY_CARE_PROVIDER_SITE_OTHER): Payer: Medicare Other | Admitting: Urology

## 2014-02-17 DIAGNOSIS — R351 Nocturia: Secondary | ICD-10-CM | POA: Diagnosis not present

## 2014-02-17 DIAGNOSIS — N401 Enlarged prostate with lower urinary tract symptoms: Secondary | ICD-10-CM | POA: Diagnosis not present

## 2014-02-17 DIAGNOSIS — C61 Malignant neoplasm of prostate: Secondary | ICD-10-CM | POA: Diagnosis not present

## 2014-04-11 DIAGNOSIS — D631 Anemia in chronic kidney disease: Secondary | ICD-10-CM | POA: Diagnosis not present

## 2014-04-11 DIAGNOSIS — N183 Chronic kidney disease, stage 3 (moderate): Secondary | ICD-10-CM | POA: Diagnosis not present

## 2014-04-11 DIAGNOSIS — N2581 Secondary hyperparathyroidism of renal origin: Secondary | ICD-10-CM | POA: Diagnosis not present

## 2014-04-11 DIAGNOSIS — N189 Chronic kidney disease, unspecified: Secondary | ICD-10-CM | POA: Diagnosis not present

## 2014-04-11 DIAGNOSIS — E785 Hyperlipidemia, unspecified: Secondary | ICD-10-CM | POA: Diagnosis not present

## 2014-05-24 DIAGNOSIS — R972 Elevated prostate specific antigen [PSA]: Secondary | ICD-10-CM | POA: Diagnosis not present

## 2014-05-29 ENCOUNTER — Telehealth: Payer: Self-pay | Admitting: Internal Medicine

## 2014-05-29 NOTE — Telephone Encounter (Signed)
ON RECALL FOR TCS °

## 2014-05-30 NOTE — Telephone Encounter (Signed)
Letter mailed to pt.  

## 2014-06-09 ENCOUNTER — Ambulatory Visit (INDEPENDENT_AMBULATORY_CARE_PROVIDER_SITE_OTHER): Payer: Medicare Other | Admitting: Urology

## 2014-06-09 DIAGNOSIS — R972 Elevated prostate specific antigen [PSA]: Secondary | ICD-10-CM

## 2014-06-09 DIAGNOSIS — R351 Nocturia: Secondary | ICD-10-CM | POA: Diagnosis not present

## 2014-06-09 DIAGNOSIS — C61 Malignant neoplasm of prostate: Secondary | ICD-10-CM | POA: Diagnosis not present

## 2014-06-09 DIAGNOSIS — N401 Enlarged prostate with lower urinary tract symptoms: Secondary | ICD-10-CM | POA: Diagnosis not present

## 2014-07-19 DIAGNOSIS — Z1389 Encounter for screening for other disorder: Secondary | ICD-10-CM | POA: Diagnosis not present

## 2014-07-19 DIAGNOSIS — E782 Mixed hyperlipidemia: Secondary | ICD-10-CM | POA: Diagnosis not present

## 2014-07-19 DIAGNOSIS — R739 Hyperglycemia, unspecified: Secondary | ICD-10-CM | POA: Diagnosis not present

## 2014-07-19 DIAGNOSIS — E1129 Type 2 diabetes mellitus with other diabetic kidney complication: Secondary | ICD-10-CM | POA: Diagnosis not present

## 2014-07-19 DIAGNOSIS — Z0000 Encounter for general adult medical examination without abnormal findings: Secondary | ICD-10-CM | POA: Diagnosis not present

## 2014-08-23 DIAGNOSIS — C61 Malignant neoplasm of prostate: Secondary | ICD-10-CM | POA: Diagnosis not present

## 2014-09-01 ENCOUNTER — Ambulatory Visit (INDEPENDENT_AMBULATORY_CARE_PROVIDER_SITE_OTHER): Payer: Medicare Other | Admitting: Urology

## 2014-09-01 DIAGNOSIS — C61 Malignant neoplasm of prostate: Secondary | ICD-10-CM | POA: Diagnosis not present

## 2014-09-01 DIAGNOSIS — N401 Enlarged prostate with lower urinary tract symptoms: Secondary | ICD-10-CM | POA: Diagnosis not present

## 2014-09-01 DIAGNOSIS — R351 Nocturia: Secondary | ICD-10-CM

## 2014-10-25 DIAGNOSIS — Z23 Encounter for immunization: Secondary | ICD-10-CM | POA: Diagnosis not present

## 2014-10-25 DIAGNOSIS — Z1389 Encounter for screening for other disorder: Secondary | ICD-10-CM | POA: Diagnosis not present

## 2014-10-25 DIAGNOSIS — R7309 Other abnormal glucose: Secondary | ICD-10-CM | POA: Diagnosis not present

## 2014-10-25 DIAGNOSIS — I10 Essential (primary) hypertension: Secondary | ICD-10-CM | POA: Diagnosis not present

## 2014-11-22 DIAGNOSIS — C61 Malignant neoplasm of prostate: Secondary | ICD-10-CM | POA: Diagnosis not present

## 2014-12-01 ENCOUNTER — Ambulatory Visit (INDEPENDENT_AMBULATORY_CARE_PROVIDER_SITE_OTHER): Payer: Medicare Other | Admitting: Urology

## 2014-12-01 ENCOUNTER — Other Ambulatory Visit: Payer: Self-pay | Admitting: Urology

## 2014-12-01 DIAGNOSIS — R351 Nocturia: Secondary | ICD-10-CM | POA: Diagnosis not present

## 2014-12-01 DIAGNOSIS — C61 Malignant neoplasm of prostate: Secondary | ICD-10-CM

## 2014-12-01 DIAGNOSIS — R972 Elevated prostate specific antigen [PSA]: Secondary | ICD-10-CM | POA: Diagnosis not present

## 2014-12-01 DIAGNOSIS — N403 Nodular prostate with lower urinary tract symptoms: Secondary | ICD-10-CM | POA: Diagnosis not present

## 2014-12-20 ENCOUNTER — Other Ambulatory Visit: Payer: Self-pay | Admitting: Urology

## 2014-12-20 DIAGNOSIS — C61 Malignant neoplasm of prostate: Secondary | ICD-10-CM

## 2014-12-29 ENCOUNTER — Ambulatory Visit (HOSPITAL_COMMUNITY)
Admission: RE | Admit: 2014-12-29 | Discharge: 2014-12-29 | Disposition: A | Discharge status: Home / Self Care | Payer: Medicare Other | Source: Ambulatory Visit | Attending: Urology | Admitting: Urology

## 2014-12-29 DIAGNOSIS — C61 Malignant neoplasm of prostate: Secondary | ICD-10-CM | POA: Diagnosis not present

## 2014-12-29 MED ORDER — LIDOCAINE HCL (PF) 1 % IJ SOLN
INTRAMUSCULAR | Status: AC
  Administered 2014-12-29: 2.1 mL
  Filled 2014-12-29: qty 5

## 2014-12-29 MED ORDER — GENTAMICIN SULFATE 40 MG/ML IJ SOLN
INTRAMUSCULAR | Status: AC
  Filled 2014-12-29: qty 2

## 2014-12-29 MED ORDER — GENTAMICIN SULFATE 40 MG/ML IJ SOLN
INTRAMUSCULAR | Status: AC
  Filled 2014-12-29: qty 4

## 2014-12-29 MED ORDER — CEFTRIAXONE SODIUM 1 G IJ SOLR
INTRAMUSCULAR | Status: AC
  Administered 2014-12-29: 1000 mg
  Filled 2014-12-29: qty 10

## 2014-12-29 MED ORDER — LIDOCAINE HCL (PF) 2 % IJ SOLN
INTRAMUSCULAR | Status: AC
  Filled 2014-12-29: qty 10

## 2014-12-29 NOTE — Sedation Documentation (Signed)
Pt tolerated procedure without difficulty.

## 2014-12-29 NOTE — Sedation Documentation (Signed)
Dr. Jeffie Pollock aware of bp. Will continue re-assess and will hold pt after to watch bp. Pt denies any pain

## 2014-12-29 NOTE — Sedation Documentation (Signed)
Patient denies pain and is resting comfortably.  

## 2014-12-29 NOTE — Sedation Documentation (Signed)
MD at bedside. 

## 2015-02-02 ENCOUNTER — Institutional Professional Consult (permissible substitution) (INDEPENDENT_AMBULATORY_CARE_PROVIDER_SITE_OTHER): Payer: Medicare Other | Admitting: Urology

## 2015-02-02 DIAGNOSIS — C61 Malignant neoplasm of prostate: Secondary | ICD-10-CM | POA: Diagnosis not present

## 2015-02-14 DIAGNOSIS — E7800 Pure hypercholesterolemia, unspecified: Secondary | ICD-10-CM | POA: Diagnosis not present

## 2015-02-14 DIAGNOSIS — Z8639 Personal history of other endocrine, nutritional and metabolic disease: Secondary | ICD-10-CM | POA: Diagnosis not present

## 2015-02-14 DIAGNOSIS — I10 Essential (primary) hypertension: Secondary | ICD-10-CM | POA: Diagnosis not present

## 2015-02-14 DIAGNOSIS — M1990 Unspecified osteoarthritis, unspecified site: Secondary | ICD-10-CM | POA: Diagnosis not present

## 2015-02-14 DIAGNOSIS — Z79899 Other long term (current) drug therapy: Secondary | ICD-10-CM | POA: Diagnosis not present

## 2015-02-14 DIAGNOSIS — E119 Type 2 diabetes mellitus without complications: Secondary | ICD-10-CM | POA: Diagnosis not present

## 2015-02-14 DIAGNOSIS — Z808 Family history of malignant neoplasm of other organs or systems: Secondary | ICD-10-CM | POA: Diagnosis not present

## 2015-02-14 DIAGNOSIS — C61 Malignant neoplasm of prostate: Secondary | ICD-10-CM | POA: Diagnosis not present

## 2015-02-16 ENCOUNTER — Other Ambulatory Visit: Payer: Self-pay | Admitting: Urology

## 2015-02-16 DIAGNOSIS — C61 Malignant neoplasm of prostate: Secondary | ICD-10-CM

## 2015-02-27 ENCOUNTER — Other Ambulatory Visit: Payer: Self-pay | Admitting: Urology

## 2015-02-27 DIAGNOSIS — C61 Malignant neoplasm of prostate: Secondary | ICD-10-CM

## 2015-03-02 ENCOUNTER — Ambulatory Visit (HOSPITAL_COMMUNITY)
Admission: RE | Admit: 2015-03-02 | Discharge: 2015-03-02 | Disposition: A | Discharge status: Home / Self Care | Payer: Medicare Other | Source: Ambulatory Visit | Attending: Urology | Admitting: Urology

## 2015-03-02 DIAGNOSIS — C61 Malignant neoplasm of prostate: Secondary | ICD-10-CM | POA: Insufficient documentation

## 2015-03-02 MED ORDER — CEFTRIAXONE SODIUM 1 G IJ SOLR
1.0000 g | Freq: Once | INTRAMUSCULAR | Status: AC
  Administered 2015-03-02: 1 g via INTRAMUSCULAR

## 2015-03-02 MED ORDER — LIDOCAINE HCL (PF) 1 % IJ SOLN
INTRAMUSCULAR | Status: AC
  Administered 2015-03-02: 5 mL
  Filled 2015-03-02: qty 5

## 2015-03-02 MED ORDER — CEFTRIAXONE SODIUM 1 G IJ SOLR
INTRAMUSCULAR | Status: AC
  Administered 2015-03-02: 1 g via INTRAMUSCULAR
  Filled 2015-03-02: qty 10

## 2015-03-02 MED ORDER — LIDOCAINE HCL (PF) 1 % IJ SOLN
5.0000 mL | Freq: Once | INTRAMUSCULAR | Status: AC
  Administered 2015-03-02: 5 mL

## 2015-03-02 MED ORDER — LIDOCAINE HCL (PF) 2 % IJ SOLN: 10.0000 mL | Freq: Once | INTRAMUSCULAR | Status: DC

## 2015-03-02 MED ORDER — LIDOCAINE HCL (PF) 2 % IJ SOLN
INTRAMUSCULAR | Status: AC
  Filled 2015-03-02: qty 10

## 2015-03-07 DIAGNOSIS — Z79899 Other long term (current) drug therapy: Secondary | ICD-10-CM | POA: Diagnosis not present

## 2015-03-07 DIAGNOSIS — Z808 Family history of malignant neoplasm of other organs or systems: Secondary | ICD-10-CM | POA: Diagnosis not present

## 2015-03-07 DIAGNOSIS — I10 Essential (primary) hypertension: Secondary | ICD-10-CM | POA: Diagnosis not present

## 2015-03-07 DIAGNOSIS — C61 Malignant neoplasm of prostate: Secondary | ICD-10-CM | POA: Diagnosis not present

## 2015-03-07 DIAGNOSIS — Z8639 Personal history of other endocrine, nutritional and metabolic disease: Secondary | ICD-10-CM | POA: Diagnosis not present

## 2015-03-07 DIAGNOSIS — E7800 Pure hypercholesterolemia, unspecified: Secondary | ICD-10-CM | POA: Diagnosis not present

## 2015-03-07 DIAGNOSIS — E119 Type 2 diabetes mellitus without complications: Secondary | ICD-10-CM | POA: Diagnosis not present

## 2015-03-07 DIAGNOSIS — M1990 Unspecified osteoarthritis, unspecified site: Secondary | ICD-10-CM | POA: Diagnosis not present

## 2015-03-15 DIAGNOSIS — Z510 Encounter for antineoplastic radiation therapy: Secondary | ICD-10-CM | POA: Diagnosis not present

## 2015-03-15 DIAGNOSIS — C61 Malignant neoplasm of prostate: Secondary | ICD-10-CM | POA: Diagnosis not present

## 2015-03-19 DIAGNOSIS — Z510 Encounter for antineoplastic radiation therapy: Secondary | ICD-10-CM | POA: Diagnosis not present

## 2015-03-19 DIAGNOSIS — C61 Malignant neoplasm of prostate: Secondary | ICD-10-CM | POA: Diagnosis not present

## 2015-03-20 DIAGNOSIS — C61 Malignant neoplasm of prostate: Secondary | ICD-10-CM | POA: Diagnosis not present

## 2015-03-20 DIAGNOSIS — Z510 Encounter for antineoplastic radiation therapy: Secondary | ICD-10-CM | POA: Diagnosis not present

## 2015-03-21 DIAGNOSIS — C61 Malignant neoplasm of prostate: Secondary | ICD-10-CM | POA: Diagnosis not present

## 2015-03-21 DIAGNOSIS — Z510 Encounter for antineoplastic radiation therapy: Secondary | ICD-10-CM | POA: Diagnosis not present

## 2015-03-22 DIAGNOSIS — Z510 Encounter for antineoplastic radiation therapy: Secondary | ICD-10-CM | POA: Diagnosis not present

## 2015-03-22 DIAGNOSIS — C61 Malignant neoplasm of prostate: Secondary | ICD-10-CM | POA: Diagnosis not present

## 2015-03-23 DIAGNOSIS — Z510 Encounter for antineoplastic radiation therapy: Secondary | ICD-10-CM | POA: Diagnosis not present

## 2015-03-23 DIAGNOSIS — C61 Malignant neoplasm of prostate: Secondary | ICD-10-CM | POA: Diagnosis not present

## 2015-03-26 DIAGNOSIS — C61 Malignant neoplasm of prostate: Secondary | ICD-10-CM | POA: Diagnosis not present

## 2015-03-26 DIAGNOSIS — Z510 Encounter for antineoplastic radiation therapy: Secondary | ICD-10-CM | POA: Diagnosis not present

## 2015-03-27 DIAGNOSIS — Z510 Encounter for antineoplastic radiation therapy: Secondary | ICD-10-CM | POA: Diagnosis not present

## 2015-03-27 DIAGNOSIS — C61 Malignant neoplasm of prostate: Secondary | ICD-10-CM | POA: Diagnosis not present

## 2015-03-28 DIAGNOSIS — Z510 Encounter for antineoplastic radiation therapy: Secondary | ICD-10-CM | POA: Diagnosis not present

## 2015-03-28 DIAGNOSIS — C61 Malignant neoplasm of prostate: Secondary | ICD-10-CM | POA: Diagnosis not present

## 2015-03-29 DIAGNOSIS — C61 Malignant neoplasm of prostate: Secondary | ICD-10-CM | POA: Diagnosis not present

## 2015-03-29 DIAGNOSIS — Z510 Encounter for antineoplastic radiation therapy: Secondary | ICD-10-CM | POA: Diagnosis not present

## 2015-03-30 DIAGNOSIS — Z510 Encounter for antineoplastic radiation therapy: Secondary | ICD-10-CM | POA: Diagnosis not present

## 2015-03-30 DIAGNOSIS — C61 Malignant neoplasm of prostate: Secondary | ICD-10-CM | POA: Diagnosis not present

## 2015-04-02 DIAGNOSIS — Z510 Encounter for antineoplastic radiation therapy: Secondary | ICD-10-CM | POA: Diagnosis not present

## 2015-04-02 DIAGNOSIS — C61 Malignant neoplasm of prostate: Secondary | ICD-10-CM | POA: Diagnosis not present

## 2015-04-03 DIAGNOSIS — C61 Malignant neoplasm of prostate: Secondary | ICD-10-CM | POA: Diagnosis not present

## 2015-04-03 DIAGNOSIS — Z510 Encounter for antineoplastic radiation therapy: Secondary | ICD-10-CM | POA: Diagnosis not present

## 2015-04-04 DIAGNOSIS — Z510 Encounter for antineoplastic radiation therapy: Secondary | ICD-10-CM | POA: Diagnosis not present

## 2015-04-04 DIAGNOSIS — C61 Malignant neoplasm of prostate: Secondary | ICD-10-CM | POA: Diagnosis not present

## 2015-04-05 DIAGNOSIS — Z510 Encounter for antineoplastic radiation therapy: Secondary | ICD-10-CM | POA: Diagnosis not present

## 2015-04-05 DIAGNOSIS — C61 Malignant neoplasm of prostate: Secondary | ICD-10-CM | POA: Diagnosis not present

## 2015-04-06 DIAGNOSIS — Z510 Encounter for antineoplastic radiation therapy: Secondary | ICD-10-CM | POA: Diagnosis not present

## 2015-04-06 DIAGNOSIS — C61 Malignant neoplasm of prostate: Secondary | ICD-10-CM | POA: Diagnosis not present

## 2015-04-09 DIAGNOSIS — C61 Malignant neoplasm of prostate: Secondary | ICD-10-CM | POA: Diagnosis not present

## 2015-04-09 DIAGNOSIS — Z510 Encounter for antineoplastic radiation therapy: Secondary | ICD-10-CM | POA: Diagnosis not present

## 2015-04-10 DIAGNOSIS — C61 Malignant neoplasm of prostate: Secondary | ICD-10-CM | POA: Diagnosis not present

## 2015-04-10 DIAGNOSIS — Z510 Encounter for antineoplastic radiation therapy: Secondary | ICD-10-CM | POA: Diagnosis not present

## 2015-04-11 DIAGNOSIS — Z510 Encounter for antineoplastic radiation therapy: Secondary | ICD-10-CM | POA: Diagnosis not present

## 2015-04-11 DIAGNOSIS — C61 Malignant neoplasm of prostate: Secondary | ICD-10-CM | POA: Diagnosis not present

## 2015-04-12 DIAGNOSIS — Z510 Encounter for antineoplastic radiation therapy: Secondary | ICD-10-CM | POA: Diagnosis not present

## 2015-04-12 DIAGNOSIS — C61 Malignant neoplasm of prostate: Secondary | ICD-10-CM | POA: Diagnosis not present

## 2015-04-13 DIAGNOSIS — C61 Malignant neoplasm of prostate: Secondary | ICD-10-CM | POA: Diagnosis not present

## 2015-04-13 DIAGNOSIS — Z510 Encounter for antineoplastic radiation therapy: Secondary | ICD-10-CM | POA: Diagnosis not present

## 2015-04-16 DIAGNOSIS — C61 Malignant neoplasm of prostate: Secondary | ICD-10-CM | POA: Diagnosis not present

## 2015-04-16 DIAGNOSIS — Z510 Encounter for antineoplastic radiation therapy: Secondary | ICD-10-CM | POA: Diagnosis not present

## 2015-04-17 DIAGNOSIS — C61 Malignant neoplasm of prostate: Secondary | ICD-10-CM | POA: Diagnosis not present

## 2015-04-17 DIAGNOSIS — Z510 Encounter for antineoplastic radiation therapy: Secondary | ICD-10-CM | POA: Diagnosis not present

## 2015-04-18 DIAGNOSIS — C61 Malignant neoplasm of prostate: Secondary | ICD-10-CM | POA: Diagnosis not present

## 2015-04-18 DIAGNOSIS — Z510 Encounter for antineoplastic radiation therapy: Secondary | ICD-10-CM | POA: Diagnosis not present

## 2015-04-19 DIAGNOSIS — Z510 Encounter for antineoplastic radiation therapy: Secondary | ICD-10-CM | POA: Diagnosis not present

## 2015-04-19 DIAGNOSIS — C61 Malignant neoplasm of prostate: Secondary | ICD-10-CM | POA: Diagnosis not present

## 2015-04-20 DIAGNOSIS — M1990 Unspecified osteoarthritis, unspecified site: Secondary | ICD-10-CM | POA: Diagnosis not present

## 2015-04-20 DIAGNOSIS — Z1389 Encounter for screening for other disorder: Secondary | ICD-10-CM | POA: Diagnosis not present

## 2015-04-20 DIAGNOSIS — Z510 Encounter for antineoplastic radiation therapy: Secondary | ICD-10-CM | POA: Diagnosis not present

## 2015-04-20 DIAGNOSIS — I10 Essential (primary) hypertension: Secondary | ICD-10-CM | POA: Diagnosis not present

## 2015-04-20 DIAGNOSIS — C61 Malignant neoplasm of prostate: Secondary | ICD-10-CM | POA: Diagnosis not present

## 2015-04-23 DIAGNOSIS — C61 Malignant neoplasm of prostate: Secondary | ICD-10-CM | POA: Diagnosis not present

## 2015-04-23 DIAGNOSIS — Z510 Encounter for antineoplastic radiation therapy: Secondary | ICD-10-CM | POA: Diagnosis not present

## 2015-04-24 DIAGNOSIS — Z510 Encounter for antineoplastic radiation therapy: Secondary | ICD-10-CM | POA: Diagnosis not present

## 2015-04-24 DIAGNOSIS — C61 Malignant neoplasm of prostate: Secondary | ICD-10-CM | POA: Diagnosis not present

## 2015-04-25 DIAGNOSIS — C61 Malignant neoplasm of prostate: Secondary | ICD-10-CM | POA: Diagnosis not present

## 2015-04-25 DIAGNOSIS — Z510 Encounter for antineoplastic radiation therapy: Secondary | ICD-10-CM | POA: Diagnosis not present

## 2015-04-26 DIAGNOSIS — Z510 Encounter for antineoplastic radiation therapy: Secondary | ICD-10-CM | POA: Diagnosis not present

## 2015-04-26 DIAGNOSIS — C61 Malignant neoplasm of prostate: Secondary | ICD-10-CM | POA: Diagnosis not present

## 2015-04-30 DIAGNOSIS — Z510 Encounter for antineoplastic radiation therapy: Secondary | ICD-10-CM | POA: Diagnosis not present

## 2015-04-30 DIAGNOSIS — C61 Malignant neoplasm of prostate: Secondary | ICD-10-CM | POA: Diagnosis not present

## 2015-05-01 DIAGNOSIS — Z510 Encounter for antineoplastic radiation therapy: Secondary | ICD-10-CM | POA: Diagnosis not present

## 2015-05-01 DIAGNOSIS — C61 Malignant neoplasm of prostate: Secondary | ICD-10-CM | POA: Diagnosis not present

## 2015-05-02 DIAGNOSIS — C61 Malignant neoplasm of prostate: Secondary | ICD-10-CM | POA: Diagnosis not present

## 2015-05-02 DIAGNOSIS — Z510 Encounter for antineoplastic radiation therapy: Secondary | ICD-10-CM | POA: Diagnosis not present

## 2015-05-03 DIAGNOSIS — C61 Malignant neoplasm of prostate: Secondary | ICD-10-CM | POA: Diagnosis not present

## 2015-05-03 DIAGNOSIS — Z510 Encounter for antineoplastic radiation therapy: Secondary | ICD-10-CM | POA: Diagnosis not present

## 2015-05-04 DIAGNOSIS — C61 Malignant neoplasm of prostate: Secondary | ICD-10-CM | POA: Diagnosis not present

## 2015-05-04 DIAGNOSIS — Z510 Encounter for antineoplastic radiation therapy: Secondary | ICD-10-CM | POA: Diagnosis not present

## 2015-05-07 DIAGNOSIS — Z510 Encounter for antineoplastic radiation therapy: Secondary | ICD-10-CM | POA: Diagnosis not present

## 2015-05-07 DIAGNOSIS — C61 Malignant neoplasm of prostate: Secondary | ICD-10-CM | POA: Diagnosis not present

## 2015-05-08 DIAGNOSIS — C61 Malignant neoplasm of prostate: Secondary | ICD-10-CM | POA: Diagnosis not present

## 2015-05-08 DIAGNOSIS — Z510 Encounter for antineoplastic radiation therapy: Secondary | ICD-10-CM | POA: Diagnosis not present

## 2015-05-09 DIAGNOSIS — Z510 Encounter for antineoplastic radiation therapy: Secondary | ICD-10-CM | POA: Diagnosis not present

## 2015-05-09 DIAGNOSIS — C61 Malignant neoplasm of prostate: Secondary | ICD-10-CM | POA: Diagnosis not present

## 2015-05-10 DIAGNOSIS — Z510 Encounter for antineoplastic radiation therapy: Secondary | ICD-10-CM | POA: Diagnosis not present

## 2015-05-10 DIAGNOSIS — C61 Malignant neoplasm of prostate: Secondary | ICD-10-CM | POA: Diagnosis not present

## 2015-05-11 DIAGNOSIS — Z510 Encounter for antineoplastic radiation therapy: Secondary | ICD-10-CM | POA: Diagnosis not present

## 2015-05-11 DIAGNOSIS — C61 Malignant neoplasm of prostate: Secondary | ICD-10-CM | POA: Diagnosis not present

## 2015-05-14 DIAGNOSIS — Z510 Encounter for antineoplastic radiation therapy: Secondary | ICD-10-CM | POA: Diagnosis not present

## 2015-05-14 DIAGNOSIS — C61 Malignant neoplasm of prostate: Secondary | ICD-10-CM | POA: Diagnosis not present

## 2015-05-22 DIAGNOSIS — L570 Actinic keratosis: Secondary | ICD-10-CM | POA: Diagnosis not present

## 2015-05-22 DIAGNOSIS — C44319 Basal cell carcinoma of skin of other parts of face: Secondary | ICD-10-CM | POA: Diagnosis not present

## 2015-05-22 DIAGNOSIS — D485 Neoplasm of uncertain behavior of skin: Secondary | ICD-10-CM | POA: Diagnosis not present

## 2015-05-22 DIAGNOSIS — C44622 Squamous cell carcinoma of skin of right upper limb, including shoulder: Secondary | ICD-10-CM | POA: Diagnosis not present

## 2015-05-22 DIAGNOSIS — Z85828 Personal history of other malignant neoplasm of skin: Secondary | ICD-10-CM | POA: Diagnosis not present

## 2015-05-22 DIAGNOSIS — D0462 Carcinoma in situ of skin of left upper limb, including shoulder: Secondary | ICD-10-CM | POA: Diagnosis not present

## 2015-06-14 DIAGNOSIS — C44319 Basal cell carcinoma of skin of other parts of face: Secondary | ICD-10-CM | POA: Diagnosis not present

## 2015-06-14 DIAGNOSIS — C44629 Squamous cell carcinoma of skin of left upper limb, including shoulder: Secondary | ICD-10-CM | POA: Diagnosis not present

## 2015-06-20 DIAGNOSIS — C61 Malignant neoplasm of prostate: Secondary | ICD-10-CM | POA: Diagnosis not present

## 2015-06-20 DIAGNOSIS — Z923 Personal history of irradiation: Secondary | ICD-10-CM | POA: Diagnosis not present

## 2015-06-28 DIAGNOSIS — C44629 Squamous cell carcinoma of skin of left upper limb, including shoulder: Secondary | ICD-10-CM | POA: Diagnosis not present

## 2015-08-03 DIAGNOSIS — Z0001 Encounter for general adult medical examination with abnormal findings: Secondary | ICD-10-CM | POA: Diagnosis not present

## 2015-08-03 DIAGNOSIS — I10 Essential (primary) hypertension: Secondary | ICD-10-CM | POA: Diagnosis not present

## 2015-08-03 DIAGNOSIS — G4700 Insomnia, unspecified: Secondary | ICD-10-CM | POA: Diagnosis not present

## 2015-08-03 DIAGNOSIS — Z1389 Encounter for screening for other disorder: Secondary | ICD-10-CM | POA: Diagnosis not present

## 2015-08-07 DIAGNOSIS — C61 Malignant neoplasm of prostate: Secondary | ICD-10-CM | POA: Diagnosis not present

## 2015-08-10 ENCOUNTER — Ambulatory Visit (INDEPENDENT_AMBULATORY_CARE_PROVIDER_SITE_OTHER): Payer: Medicare Other | Admitting: Urology

## 2015-08-10 DIAGNOSIS — C61 Malignant neoplasm of prostate: Secondary | ICD-10-CM | POA: Diagnosis not present

## 2015-08-10 DIAGNOSIS — R351 Nocturia: Secondary | ICD-10-CM

## 2015-08-10 DIAGNOSIS — R350 Frequency of micturition: Secondary | ICD-10-CM

## 2015-10-23 DIAGNOSIS — Z85828 Personal history of other malignant neoplasm of skin: Secondary | ICD-10-CM | POA: Diagnosis not present

## 2015-10-23 DIAGNOSIS — L570 Actinic keratosis: Secondary | ICD-10-CM | POA: Diagnosis not present

## 2016-01-28 DIAGNOSIS — C61 Malignant neoplasm of prostate: Secondary | ICD-10-CM | POA: Diagnosis not present

## 2016-02-08 ENCOUNTER — Ambulatory Visit (INDEPENDENT_AMBULATORY_CARE_PROVIDER_SITE_OTHER): Payer: Medicare Other | Admitting: Urology

## 2016-02-08 DIAGNOSIS — Z8546 Personal history of malignant neoplasm of prostate: Secondary | ICD-10-CM

## 2016-02-08 DIAGNOSIS — R351 Nocturia: Secondary | ICD-10-CM

## 2016-02-08 DIAGNOSIS — N403 Nodular prostate with lower urinary tract symptoms: Secondary | ICD-10-CM | POA: Diagnosis not present

## 2016-02-12 DIAGNOSIS — E782 Mixed hyperlipidemia: Secondary | ICD-10-CM | POA: Diagnosis not present

## 2016-02-12 DIAGNOSIS — G4700 Insomnia, unspecified: Secondary | ICD-10-CM | POA: Diagnosis not present

## 2016-02-12 DIAGNOSIS — Z1389 Encounter for screening for other disorder: Secondary | ICD-10-CM | POA: Diagnosis not present

## 2016-02-12 DIAGNOSIS — E1129 Type 2 diabetes mellitus with other diabetic kidney complication: Secondary | ICD-10-CM | POA: Diagnosis not present

## 2016-02-12 DIAGNOSIS — I10 Essential (primary) hypertension: Secondary | ICD-10-CM | POA: Diagnosis not present

## 2016-04-22 DIAGNOSIS — L570 Actinic keratosis: Secondary | ICD-10-CM | POA: Diagnosis not present

## 2016-05-09 DIAGNOSIS — G4700 Insomnia, unspecified: Secondary | ICD-10-CM | POA: Diagnosis not present

## 2016-05-09 DIAGNOSIS — I10 Essential (primary) hypertension: Secondary | ICD-10-CM | POA: Diagnosis not present

## 2016-05-09 DIAGNOSIS — M1991 Primary osteoarthritis, unspecified site: Secondary | ICD-10-CM | POA: Diagnosis not present

## 2016-05-09 DIAGNOSIS — E119 Type 2 diabetes mellitus without complications: Secondary | ICD-10-CM | POA: Diagnosis not present

## 2016-05-09 DIAGNOSIS — E782 Mixed hyperlipidemia: Secondary | ICD-10-CM | POA: Diagnosis not present

## 2016-05-09 DIAGNOSIS — G894 Chronic pain syndrome: Secondary | ICD-10-CM | POA: Diagnosis not present

## 2016-09-05 ENCOUNTER — Ambulatory Visit (INDEPENDENT_AMBULATORY_CARE_PROVIDER_SITE_OTHER): Payer: Medicare Other | Admitting: Urology

## 2016-09-05 DIAGNOSIS — Z8546 Personal history of malignant neoplasm of prostate: Secondary | ICD-10-CM

## 2016-09-05 DIAGNOSIS — N403 Nodular prostate with lower urinary tract symptoms: Secondary | ICD-10-CM

## 2016-09-05 DIAGNOSIS — N3941 Urge incontinence: Secondary | ICD-10-CM | POA: Diagnosis not present

## 2016-09-08 DIAGNOSIS — M1990 Unspecified osteoarthritis, unspecified site: Secondary | ICD-10-CM | POA: Diagnosis not present

## 2016-09-08 DIAGNOSIS — Z79891 Long term (current) use of opiate analgesic: Secondary | ICD-10-CM | POA: Diagnosis not present

## 2016-09-08 DIAGNOSIS — G4700 Insomnia, unspecified: Secondary | ICD-10-CM | POA: Diagnosis not present

## 2016-10-20 DIAGNOSIS — D0461 Carcinoma in situ of skin of right upper limb, including shoulder: Secondary | ICD-10-CM | POA: Diagnosis not present

## 2016-10-20 DIAGNOSIS — L570 Actinic keratosis: Secondary | ICD-10-CM | POA: Diagnosis not present

## 2016-10-20 DIAGNOSIS — Z85828 Personal history of other malignant neoplasm of skin: Secondary | ICD-10-CM | POA: Diagnosis not present

## 2016-10-20 DIAGNOSIS — L309 Dermatitis, unspecified: Secondary | ICD-10-CM | POA: Diagnosis not present

## 2016-10-20 DIAGNOSIS — D485 Neoplasm of uncertain behavior of skin: Secondary | ICD-10-CM | POA: Diagnosis not present

## 2016-10-20 DIAGNOSIS — L820 Inflamed seborrheic keratosis: Secondary | ICD-10-CM | POA: Diagnosis not present

## 2016-11-13 DIAGNOSIS — C44622 Squamous cell carcinoma of skin of right upper limb, including shoulder: Secondary | ICD-10-CM | POA: Diagnosis not present

## 2016-12-10 DIAGNOSIS — H524 Presbyopia: Secondary | ICD-10-CM | POA: Diagnosis not present

## 2017-01-12 DIAGNOSIS — Z1389 Encounter for screening for other disorder: Secondary | ICD-10-CM | POA: Diagnosis not present

## 2017-01-12 DIAGNOSIS — G4700 Insomnia, unspecified: Secondary | ICD-10-CM | POA: Diagnosis not present

## 2017-03-20 ENCOUNTER — Ambulatory Visit: Payer: Medicare Other | Admitting: Urology

## 2017-03-20 DIAGNOSIS — N3941 Urge incontinence: Secondary | ICD-10-CM

## 2017-03-20 DIAGNOSIS — Z8546 Personal history of malignant neoplasm of prostate: Secondary | ICD-10-CM | POA: Diagnosis not present

## 2017-03-20 DIAGNOSIS — R351 Nocturia: Secondary | ICD-10-CM

## 2017-04-20 DIAGNOSIS — D485 Neoplasm of uncertain behavior of skin: Secondary | ICD-10-CM | POA: Diagnosis not present

## 2017-04-20 DIAGNOSIS — Z85828 Personal history of other malignant neoplasm of skin: Secondary | ICD-10-CM | POA: Diagnosis not present

## 2017-04-20 DIAGNOSIS — L570 Actinic keratosis: Secondary | ICD-10-CM | POA: Diagnosis not present

## 2017-05-08 DIAGNOSIS — I10 Essential (primary) hypertension: Secondary | ICD-10-CM | POA: Diagnosis not present

## 2017-05-08 DIAGNOSIS — G894 Chronic pain syndrome: Secondary | ICD-10-CM | POA: Diagnosis not present

## 2017-05-08 DIAGNOSIS — Z1389 Encounter for screening for other disorder: Secondary | ICD-10-CM | POA: Diagnosis not present

## 2017-05-08 DIAGNOSIS — N184 Chronic kidney disease, stage 4 (severe): Secondary | ICD-10-CM | POA: Diagnosis not present

## 2017-05-08 DIAGNOSIS — R7309 Other abnormal glucose: Secondary | ICD-10-CM | POA: Diagnosis not present

## 2017-05-08 DIAGNOSIS — E782 Mixed hyperlipidemia: Secondary | ICD-10-CM | POA: Diagnosis not present

## 2017-07-03 DIAGNOSIS — G4700 Insomnia, unspecified: Secondary | ICD-10-CM | POA: Diagnosis not present

## 2017-07-03 DIAGNOSIS — B356 Tinea cruris: Secondary | ICD-10-CM | POA: Diagnosis not present

## 2017-07-03 DIAGNOSIS — R7309 Other abnormal glucose: Secondary | ICD-10-CM | POA: Diagnosis not present

## 2017-07-03 DIAGNOSIS — Z1389 Encounter for screening for other disorder: Secondary | ICD-10-CM | POA: Diagnosis not present

## 2017-07-03 DIAGNOSIS — Z0001 Encounter for general adult medical examination with abnormal findings: Secondary | ICD-10-CM | POA: Diagnosis not present

## 2017-07-03 DIAGNOSIS — I10 Essential (primary) hypertension: Secondary | ICD-10-CM | POA: Diagnosis not present

## 2017-09-18 ENCOUNTER — Ambulatory Visit: Payer: Medicare Other | Admitting: Urology

## 2017-09-18 DIAGNOSIS — Z8546 Personal history of malignant neoplasm of prostate: Secondary | ICD-10-CM

## 2017-09-18 DIAGNOSIS — R351 Nocturia: Secondary | ICD-10-CM

## 2017-10-20 DIAGNOSIS — D485 Neoplasm of uncertain behavior of skin: Secondary | ICD-10-CM | POA: Diagnosis not present

## 2017-10-20 DIAGNOSIS — L570 Actinic keratosis: Secondary | ICD-10-CM | POA: Diagnosis not present

## 2017-12-03 DIAGNOSIS — E119 Type 2 diabetes mellitus without complications: Secondary | ICD-10-CM | POA: Diagnosis not present

## 2017-12-03 DIAGNOSIS — M1991 Primary osteoarthritis, unspecified site: Secondary | ICD-10-CM | POA: Diagnosis not present

## 2017-12-03 DIAGNOSIS — G894 Chronic pain syndrome: Secondary | ICD-10-CM | POA: Diagnosis not present

## 2017-12-03 DIAGNOSIS — G4709 Other insomnia: Secondary | ICD-10-CM | POA: Diagnosis not present

## 2018-03-26 ENCOUNTER — Ambulatory Visit: Payer: Medicare Other | Admitting: Urology

## 2018-03-26 DIAGNOSIS — C61 Malignant neoplasm of prostate: Secondary | ICD-10-CM

## 2018-03-26 DIAGNOSIS — R351 Nocturia: Secondary | ICD-10-CM | POA: Diagnosis not present

## 2018-03-26 DIAGNOSIS — R9721 Rising PSA following treatment for malignant neoplasm of prostate: Secondary | ICD-10-CM

## 2018-04-13 DIAGNOSIS — M1991 Primary osteoarthritis, unspecified site: Secondary | ICD-10-CM | POA: Diagnosis not present

## 2018-04-13 DIAGNOSIS — G4700 Insomnia, unspecified: Secondary | ICD-10-CM | POA: Diagnosis not present

## 2018-04-13 DIAGNOSIS — E119 Type 2 diabetes mellitus without complications: Secondary | ICD-10-CM | POA: Diagnosis not present

## 2018-05-14 DIAGNOSIS — Z1389 Encounter for screening for other disorder: Secondary | ICD-10-CM | POA: Diagnosis not present

## 2018-05-14 DIAGNOSIS — Z0001 Encounter for general adult medical examination with abnormal findings: Secondary | ICD-10-CM | POA: Diagnosis not present

## 2018-05-14 DIAGNOSIS — E119 Type 2 diabetes mellitus without complications: Secondary | ICD-10-CM | POA: Diagnosis not present

## 2018-05-14 DIAGNOSIS — N184 Chronic kidney disease, stage 4 (severe): Secondary | ICD-10-CM | POA: Diagnosis not present

## 2018-05-14 DIAGNOSIS — I10 Essential (primary) hypertension: Secondary | ICD-10-CM | POA: Diagnosis not present

## 2018-06-09 DIAGNOSIS — N184 Chronic kidney disease, stage 4 (severe): Secondary | ICD-10-CM | POA: Diagnosis not present

## 2018-06-09 DIAGNOSIS — Z139 Encounter for screening, unspecified: Secondary | ICD-10-CM | POA: Diagnosis not present

## 2018-06-09 DIAGNOSIS — R7309 Other abnormal glucose: Secondary | ICD-10-CM | POA: Diagnosis not present

## 2018-06-09 DIAGNOSIS — E7849 Other hyperlipidemia: Secondary | ICD-10-CM | POA: Diagnosis not present

## 2018-06-09 DIAGNOSIS — E119 Type 2 diabetes mellitus without complications: Secondary | ICD-10-CM | POA: Diagnosis not present

## 2018-06-09 DIAGNOSIS — E782 Mixed hyperlipidemia: Secondary | ICD-10-CM | POA: Diagnosis not present

## 2018-07-12 DIAGNOSIS — L570 Actinic keratosis: Secondary | ICD-10-CM | POA: Diagnosis not present

## 2018-08-10 DIAGNOSIS — G894 Chronic pain syndrome: Secondary | ICD-10-CM | POA: Diagnosis not present

## 2018-08-10 DIAGNOSIS — G4700 Insomnia, unspecified: Secondary | ICD-10-CM | POA: Diagnosis not present

## 2018-08-10 DIAGNOSIS — M1711 Unilateral primary osteoarthritis, right knee: Secondary | ICD-10-CM | POA: Diagnosis not present

## 2018-08-10 DIAGNOSIS — Z1389 Encounter for screening for other disorder: Secondary | ICD-10-CM | POA: Diagnosis not present

## 2018-10-01 ENCOUNTER — Ambulatory Visit: Payer: Medicare Other | Admitting: Urology

## 2018-10-01 ENCOUNTER — Other Ambulatory Visit: Payer: Self-pay

## 2018-10-01 DIAGNOSIS — Z8546 Personal history of malignant neoplasm of prostate: Secondary | ICD-10-CM

## 2018-12-13 DIAGNOSIS — I129 Hypertensive chronic kidney disease with stage 1 through stage 4 chronic kidney disease, or unspecified chronic kidney disease: Secondary | ICD-10-CM | POA: Diagnosis not present

## 2018-12-13 DIAGNOSIS — E1122 Type 2 diabetes mellitus with diabetic chronic kidney disease: Secondary | ICD-10-CM | POA: Diagnosis not present

## 2018-12-13 DIAGNOSIS — N184 Chronic kidney disease, stage 4 (severe): Secondary | ICD-10-CM | POA: Diagnosis not present

## 2018-12-13 DIAGNOSIS — E7849 Other hyperlipidemia: Secondary | ICD-10-CM | POA: Diagnosis not present

## 2018-12-31 ENCOUNTER — Other Ambulatory Visit: Payer: Self-pay

## 2018-12-31 DIAGNOSIS — Z8546 Personal history of malignant neoplasm of prostate: Secondary | ICD-10-CM

## 2019-02-22 DIAGNOSIS — M1711 Unilateral primary osteoarthritis, right knee: Secondary | ICD-10-CM | POA: Diagnosis not present

## 2019-02-22 DIAGNOSIS — G894 Chronic pain syndrome: Secondary | ICD-10-CM | POA: Diagnosis not present

## 2019-02-22 DIAGNOSIS — M2391 Unspecified internal derangement of right knee: Secondary | ICD-10-CM | POA: Diagnosis not present

## 2019-03-14 DIAGNOSIS — I4891 Unspecified atrial fibrillation: Secondary | ICD-10-CM

## 2019-03-14 DIAGNOSIS — I639 Cerebral infarction, unspecified: Secondary | ICD-10-CM

## 2019-03-14 HISTORY — DX: Unspecified atrial fibrillation: I48.91

## 2019-03-14 HISTORY — DX: Cerebral infarction, unspecified: I63.9

## 2019-04-03 ENCOUNTER — Encounter (HOSPITAL_COMMUNITY): Payer: Self-pay

## 2019-04-03 ENCOUNTER — Emergency Department (HOSPITAL_COMMUNITY): Payer: Medicare Other

## 2019-04-03 ENCOUNTER — Other Ambulatory Visit: Payer: Self-pay

## 2019-04-03 ENCOUNTER — Observation Stay (HOSPITAL_COMMUNITY)
Admission: EM | Admit: 2019-04-03 | Discharge: 2019-04-04 | Disposition: A | Discharge status: Home / Self Care | Payer: Medicare Other | Attending: Internal Medicine | Admitting: Internal Medicine

## 2019-04-03 DIAGNOSIS — R4182 Altered mental status, unspecified: Secondary | ICD-10-CM | POA: Diagnosis present

## 2019-04-03 DIAGNOSIS — Z79899 Other long term (current) drug therapy: Secondary | ICD-10-CM | POA: Diagnosis not present

## 2019-04-03 DIAGNOSIS — R9431 Abnormal electrocardiogram [ECG] [EKG]: Secondary | ICD-10-CM

## 2019-04-03 DIAGNOSIS — E785 Hyperlipidemia, unspecified: Secondary | ICD-10-CM | POA: Diagnosis not present

## 2019-04-03 DIAGNOSIS — Z20822 Contact with and (suspected) exposure to covid-19: Secondary | ICD-10-CM | POA: Diagnosis not present

## 2019-04-03 DIAGNOSIS — R202 Paresthesia of skin: Secondary | ICD-10-CM

## 2019-04-03 DIAGNOSIS — I4581 Long QT syndrome: Secondary | ICD-10-CM | POA: Diagnosis not present

## 2019-04-03 DIAGNOSIS — R531 Weakness: Secondary | ICD-10-CM | POA: Diagnosis not present

## 2019-04-03 DIAGNOSIS — Z03818 Encounter for observation for suspected exposure to other biological agents ruled out: Secondary | ICD-10-CM | POA: Diagnosis not present

## 2019-04-03 DIAGNOSIS — R29818 Other symptoms and signs involving the nervous system: Secondary | ICD-10-CM | POA: Diagnosis not present

## 2019-04-03 DIAGNOSIS — R42 Dizziness and giddiness: Secondary | ICD-10-CM | POA: Diagnosis not present

## 2019-04-03 DIAGNOSIS — I10 Essential (primary) hypertension: Secondary | ICD-10-CM | POA: Diagnosis not present

## 2019-04-03 DIAGNOSIS — R079 Chest pain, unspecified: Secondary | ICD-10-CM | POA: Diagnosis not present

## 2019-04-03 HISTORY — DX: Unspecified osteoarthritis, unspecified site: M19.90

## 2019-04-03 HISTORY — DX: Essential (primary) hypertension: I10

## 2019-04-03 HISTORY — DX: Hyperlipidemia, unspecified: E78.5

## 2019-04-03 LAB — URINALYSIS, ROUTINE W REFLEX MICROSCOPIC
Bacteria, UA: NONE SEEN
Bilirubin Urine: NEGATIVE
Glucose, UA: NEGATIVE mg/dL
Hgb urine dipstick: NEGATIVE
Ketones, ur: NEGATIVE mg/dL
Leukocytes,Ua: NEGATIVE
Nitrite: NEGATIVE
Protein, ur: 30 mg/dL — AB
Specific Gravity, Urine: 1.014 (ref 1.005–1.030)
pH: 7 (ref 5.0–8.0)

## 2019-04-03 LAB — CBC WITH DIFFERENTIAL/PLATELET
Abs Immature Granulocytes: 0.04 10*3/uL (ref 0.00–0.07)
Basophils Absolute: 0.1 10*3/uL (ref 0.0–0.1)
Basophils Relative: 1 %
Eosinophils Absolute: 0.2 10*3/uL (ref 0.0–0.5)
Eosinophils Relative: 2 %
HCT: 45.1 % (ref 39.0–52.0)
Hemoglobin: 14.7 g/dL (ref 13.0–17.0)
Immature Granulocytes: 0 %
Lymphocytes Relative: 15 %
Lymphs Abs: 1.6 10*3/uL (ref 0.7–4.0)
MCH: 31 pg (ref 26.0–34.0)
MCHC: 32.6 g/dL (ref 30.0–36.0)
MCV: 95.1 fL (ref 80.0–100.0)
Monocytes Absolute: 1.2 10*3/uL — ABNORMAL HIGH (ref 0.1–1.0)
Monocytes Relative: 12 %
Neutro Abs: 7.3 10*3/uL (ref 1.7–7.7)
Neutrophils Relative %: 70 %
Platelets: 242 10*3/uL (ref 150–400)
RBC: 4.74 MIL/uL (ref 4.22–5.81)
RDW: 12.6 % (ref 11.5–15.5)
WBC: 10.5 10*3/uL (ref 4.0–10.5)
nRBC: 0 % (ref 0.0–0.2)

## 2019-04-03 LAB — COMPREHENSIVE METABOLIC PANEL
ALT: 12 U/L (ref 0–44)
AST: 17 U/L (ref 15–41)
Albumin: 4.2 g/dL (ref 3.5–5.0)
Alkaline Phosphatase: 99 U/L (ref 38–126)
Anion gap: 13 (ref 5–15)
BUN: 19 mg/dL (ref 8–23)
CO2: 23 mmol/L (ref 22–32)
Calcium: 6.1 mg/dL — CL (ref 8.9–10.3)
Chloride: 101 mmol/L (ref 98–111)
Creatinine, Ser: 1.36 mg/dL — ABNORMAL HIGH (ref 0.61–1.24)
GFR calc Af Amer: 56 mL/min — ABNORMAL LOW (ref >60)
GFR calc non Af Amer: 48 mL/min — ABNORMAL LOW (ref >60)
Glucose, Bld: 150 mg/dL — ABNORMAL HIGH (ref 70–99)
Potassium: 4.2 mmol/L (ref 3.5–5.1)
Sodium: 137 mmol/L (ref 135–145)
Total Bilirubin: 1.1 mg/dL (ref 0.3–1.2)
Total Protein: 7.5 g/dL (ref 6.5–8.1)

## 2019-04-03 LAB — MAGNESIUM: Magnesium: 1.9 mg/dL (ref 1.7–2.4)

## 2019-04-03 LAB — TSH: TSH: 3.013 u[IU]/mL (ref 0.350–4.500)

## 2019-04-03 LAB — HEMOGLOBIN A1C
Hgb A1c MFr Bld: 6.3 % — ABNORMAL HIGH (ref 4.8–5.6)
Mean Plasma Glucose: 134.11 mg/dL

## 2019-04-03 LAB — VITAMIN D 25 HYDROXY (VIT D DEFICIENCY, FRACTURES): Vit D, 25-Hydroxy: 9.49 ng/mL — ABNORMAL LOW (ref 30–100)

## 2019-04-03 LAB — CBG MONITORING, ED: Glucose-Capillary: 143 mg/dL — ABNORMAL HIGH (ref 70–99)

## 2019-04-03 LAB — PHOSPHORUS: Phosphorus: 3.6 mg/dL (ref 2.5–4.6)

## 2019-04-03 LAB — TROPONIN I (HIGH SENSITIVITY)
Troponin I (High Sensitivity): 12 ng/L (ref <18)
Troponin I (High Sensitivity): 12 ng/L (ref <18)

## 2019-04-03 MED ORDER — CALCIUM GLUCONATE-NACL 2-0.675 GM/100ML-% IV SOLN: 2.0000 g | Freq: Once | INTRAVENOUS | Status: DC

## 2019-04-03 MED ORDER — AMLODIPINE BESYLATE 5 MG PO TABS
5.0000 mg | ORAL_TABLET | Freq: Every day | ORAL | Status: DC
  Administered 2019-04-04: 09:00:00 5 mg via ORAL
  Filled 2019-04-03: qty 1

## 2019-04-03 MED ORDER — ENOXAPARIN SODIUM 40 MG/0.4ML ~~LOC~~ SOLN
40.0000 mg | SUBCUTANEOUS | Status: DC
  Administered 2019-04-03 – 2019-04-04 (×2): 40 mg via SUBCUTANEOUS
  Filled 2019-04-03 (×2): qty 0.4

## 2019-04-03 MED ORDER — PROCHLORPERAZINE EDISYLATE 10 MG/2ML IJ SOLN: 10.0000 mg | Freq: Four times a day (QID) | INTRAMUSCULAR | Status: DC | PRN

## 2019-04-03 MED ORDER — ATORVASTATIN CALCIUM 20 MG PO TABS
20.0000 mg | ORAL_TABLET | Freq: Every day | ORAL | Status: DC
  Administered 2019-04-03: 20 mg via ORAL
  Filled 2019-04-03: qty 1

## 2019-04-03 MED ORDER — CALCIUM GLUCONATE-NACL 1-0.675 GM/50ML-% IV SOLN
1.0000 g | Freq: Once | INTRAVENOUS | Status: AC
  Administered 2019-04-03: 1000 mg via INTRAVENOUS
  Filled 2019-04-03: qty 50

## 2019-04-03 MED ORDER — SODIUM CHLORIDE 0.9 % IV SOLN: INTRAVENOUS | Status: AC

## 2019-04-03 MED ORDER — TRAMADOL HCL 50 MG PO TABS
50.0000 mg | ORAL_TABLET | Freq: Two times a day (BID) | ORAL | Status: DC | PRN
  Administered 2019-04-04: 50 mg via ORAL
  Filled 2019-04-03 (×2): qty 1

## 2019-04-03 MED ORDER — ZOLPIDEM TARTRATE 5 MG PO TABS
5.0000 mg | ORAL_TABLET | Freq: Every evening | ORAL | Status: DC | PRN
  Filled 2019-04-03: qty 1

## 2019-04-03 MED ORDER — ACETAMINOPHEN 650 MG RE SUPP: 650.0000 mg | Freq: Four times a day (QID) | RECTAL | Status: DC | PRN

## 2019-04-03 MED ORDER — CALCIUM CARBONATE-VITAMIN D 500-200 MG-UNIT PO TABS
1.0000 | ORAL_TABLET | Freq: Two times a day (BID) | ORAL | Status: DC
  Administered 2019-04-03 (×2): 1 via ORAL
  Filled 2019-04-03 (×6): qty 1

## 2019-04-03 MED ORDER — ACETAMINOPHEN 325 MG PO TABS: 650.0000 mg | ORAL_TABLET | Freq: Four times a day (QID) | ORAL | Status: DC | PRN

## 2019-04-03 NOTE — H&P (Signed)
History and Physical    Curtis Jenkins ION:[id number] DOB: [birth year] DOA: 04/03/2019  Referring MD/NP/PA: Dr. Langston Masker PCP: Redmond School, MD  Patient coming from: Home  Chief Complaint: Paresthesia, weakness, numbness/tingling  HPI: Curtis Jenkins is a 84 y.o. male with a past medical history significant for arthritis, hyperlipidemia, hypertension and insomnia; who presented to the hospital secondary to weakness, off balance, heavy/numbness in his feet and hands and facial paresthesia.  Patient reports symptoms have been present for the last 24 hours and worsening.  They were worse after waking up this morning and trigger taken patient to the hospital for further evaluation and management.  He reports no focal weakness, no chest pain, no shortness of breath, no nausea, no vomiting, no abdominal pain, no dysuria, no hematuria, no melena, no hematochezia, no headaches, no blurred vision, no cough, no rhinorrhea or any other complaints.  In the hospital extensive work-up demonstrated no acute or normalities on chest x-ray or CT head; normal potassium, renal function, magnesium and phosphorus level.  Significant abnormalities appreciated on his calcium level of 6.1 with EKG demonstrating prolongation of QT and telemetry in the ED with PVCs.  [facility] has been consulted to admit patient for further relation and management of symptomatic hypocalcemia.  Past Medical/Surgical History: Past Medical History:  Diagnosis Date  . Arthritis   . Hyperlipidemia   . Hypertension     History reviewed. No pertinent surgical history.  Social History:  reports that he has never smoked. His smokeless tobacco use includes chew. He reports that he does not drink alcohol. No history on file for drug.  Allergies: No Known Allergies  Family History:  Significant for hypertension; otherwise noncontributory as per patient reports.  Prior to Admission medications   Medication Sig Start Date End Date Taking?  Authorizing Provider  amLODipine (NORVASC) 5 MG tablet Take 5 mg by mouth daily. 01/11/19  Yes [provider]  atorvastatin (LIPITOR) 20 MG tablet Take 20 mg by mouth at bedtime. 01/25/19  Yes [provider]  traMADol (ULTRAM) 50 MG tablet Take 50 mg by mouth 3 (three) times daily as needed. 02/21/19  Yes [provider]  zolpidem (AMBIEN) 10 MG tablet Take 10 mg by mouth at bedtime as needed. 03/28/19  Yes [provider]    Review of Systems:  Negative except as otherwise mentioned in HPI.   Physical Exam: Vitals:   04/03/19 0953 04/03/19 0954 04/03/19 1000 04/03/19 1343  BP: (!) 177/114  (!) 154/90 139/82  Pulse: 87  80 86  Resp: (!) 21  (!) 21 18  Temp: 98.2 F (36.8 C)   98.8 F (37.1 C)  TempSrc: Oral   Oral  SpO2: 97%  97% 98%  Weight:  108.9 kg  101 kg  Height:  6' (1.829 m)  6' (1.829 m)    Constitutional: Reporting paresthesia and numbness in his face; no chest pain, no shortness of breath, no nausea or vomiting.  Cooperative with evaluation. Eyes: PERRL, lids and conjunctivae normal, no icterus, no nystagmus. ENMT: Mucous membranes are moist. Posterior pharynx clear of any exudate or lesions.Normal dentition.  Neck: normal, supple, no masses, no thyromegaly Respiratory: clear to auscultation bilaterally, no wheezing, no crackles. Normal respiratory effort. No accessory muscle use.  Cardiovascular: Regular rate and rhythm, no murmurs / rubs / gallops. No extremity edema. 2+ pedal pulses. No carotid bruits.  Abdomen: no tenderness, no masses palpated. No hepatosplenomegaly. Bowel sounds positive.  Musculoskeletal: no clubbing / cyanosis. No joint  deformity upper and lower extremities. Good ROM, no contractures. Normal muscle tone.  Skin: no rashes, lesions, ulcers. No induration Neurologic: CN 2-12 grossly intact. Sensation intact, DTR normal. Strength 4/5 in all 4 limbs secondary to poor effort.  Psychiatric: Normal judgment and insight.  Alert and oriented x 3. Normal mood.    Labs on Admission: I have personally reviewed the following labs and imaging studies  CBC: Recent Labs  Lab 04/03/19 0953  WBC 10.5  NEUTROABS 7.3  HGB 14.7  HCT 45.1  MCV 95.1  PLT 423   Basic Metabolic Panel: Recent Labs  Lab 04/03/19 0953 04/03/19 1133  NA 137  --   K 4.2  --   CL 101  --   CO2 23  --   GLUCOSE 150*  --   BUN 19  --   CREATININE 1.36*  --   CALCIUM 6.1*  --   MG  --  1.9  PHOS  --  3.6   GFR: Estimated Creatinine Clearance: 51.5 mL/min (A) (by C-G formula based on SCr of 1.36 mg/dL (H)).   Liver Function Tests: Recent Labs  Lab 04/03/19 0953  AST 17  ALT 12  ALKPHOS 99  BILITOT 1.1  PROT 7.5  ALBUMIN 4.2   CBG: Recent Labs  Lab 04/03/19 0947  GLUCAP 143*    Thyroid Function Tests: Recent Labs    04/03/19 1138  TSH 3.013   Urine analysis:    Component Value Date/Time   COLORURINE YELLOW 04/03/2019 1106   APPEARANCEUR CLEAR 04/03/2019 1106   LABSPEC 1.014 04/03/2019 1106   PHURINE 7.0 04/03/2019 1106   GLUCOSEU NEGATIVE 04/03/2019 1106   HGBUR NEGATIVE 04/03/2019 1106   [place] 04/03/2019 1106   KETONESUR NEGATIVE 04/03/2019 1106   PROTEINUR 30 (A) 04/03/2019 1106   UROBILINOGEN 0.2 04/11/2010 1329   NITRITE NEGATIVE 04/03/2019 1106   LEUKOCYTESUR NEGATIVE 04/03/2019 1106   Radiological Exams on Admission: CT Head Wo Contrast  Result Date: 04/03/2019 CLINICAL DATA:  Neuro deficits.  Subacute left hand tingling. EXAM: CT HEAD WITHOUT CONTRAST TECHNIQUE: Contiguous axial images were obtained from the base of the skull through the vertex without intravenous contrast. COMPARISON:  April 11, 2010 FINDINGS: Brain: No subdural, epidural, or subarachnoid hemorrhage. Cerebellum, brainstem, and basal cisterns are normal. Ventricles and sulci are mildly prominent. White matter changes are identified. No acute cortical ischemia or infarct. No mass effect or midline shift. Vascular:  Calcified atherosclerosis is seen in the intracranial carotids. Skull: Normal. Negative for fracture or focal lesion. Sinuses/Orbits: No acute finding. Other: None. IMPRESSION: 1. Chronic white matter changes. No acute intracranial abnormalities. Electronically Signed   By: Dorise Bullion III M.D   On: 04/03/2019 10:41   DG Chest Portable 1 View  Result Date: 04/03/2019 CLINICAL DATA:  Atypical chest pain eval EXAM: PORTABLE CHEST 1 VIEW COMPARISON:  Chest radiograph 04/11/2010 FINDINGS: The cardiac silhouette appears enlarged which may be accentuated by AP technique. Mediastinal contours likely within normal limits given patient rotation. The lungs are clear. No pneumothorax or significant pleural effusion. No acute finding in the visualized skeleton. IMPRESSION: Enlarged cardiac silhouette which may be accentuated by AP technique. No acute pulmonary finding. Electronically Signed   By: Audie Pinto M.D.   On: 04/03/2019 10:39    EKG: Independently reviewed.  Prolonged QT appreciated; normal axis and sinus rhythm.  No acute ischemic changes.  Assessment/Plan 1-symptomatic hypocalcemia -Patient with characteristic tingling, numbness and paresthesia symptoms affecting both feet hands face and  perioral area. -Also complaining of weakness and per family reports off balance. -CT head negative for acute abnormalities -Calcium of 6.1 found on work-up -Electrocardiogram demonstrated prolonged QT and telemetry has shown some PVCs with sinus rhythm. -Patient will be admitted to telemetry bed under observation for electrolyte repletion and to try to find the cause for his abnormal calcium level. -PTH, TSH, vitamin D level, phosphorus, magnesium and urine calcium will be checked. -Physical therapy has been contacted to assist patient ability and safety for position transition/ambulation. -No medications on his home regimen list associated with low calcium.  2-Prolonged QT interval -Most likely  associated with electrolyte abnormalities -Will minimize medications that can prolong QT -Monitor on telemetry.  3-HTN (hypertension) -Resume the use of amlodipine -Follow vital signs and adjust antihypertensive regimen as needed -Heart healthy diet has been ordered.  4-HLD (hyperlipidemia) -Continue Lipitor -Check lipid panel.  5-Weakness -As mentioned above physical therapy has been consulted for evaluation.  6-insomnia -Continue the use of Ambien nightly as needed.  7-arthritis -As needed Tylenol will be used. -Tramadol chronically prescribed will be placed on hold secondary to prolongation of QT.  DVT prophylaxis:  Lovenox Code Status: Full code. Family Communication: Daughter updated on admission. Disposition Plan: To be determined.  Anticipate discharge back home on 04/04/2019, if his abnormal electrolytes has been corrected. Consults called: None Admission status: Length of stay less than 2 midnights; telemetry bed, observation status.   Time Spent: 60 minutes.  Barton Dubois MD [facility] Pager [phone]  04/03/2019, 3:32 PM

## 2019-04-03 NOTE — ED Notes (Signed)
Date and time results received: 04/03/19 1047 (use smartphrase ".now" to insert current time)  Test: calcium Critical Value: 6.1  Name of Provider Notified: Dr. Langston Masker  Orders Received? Or Actions Taken?: n/a

## 2019-04-03 NOTE — ED Provider Notes (Signed)
[facility] EMERGENCY DEPARTMENT Provider Note   CSN: [id number] Arrival date & time: 04/03/19  4008     History CC: Numbness around mouth, left hand  Curtis Jenkins is a 84 y.o. male history of hypertension, high cholesterol, anxiety, presenting to the emergency department with lightheadedness and left hand tingling.  Patient reports he went to bed his usual state of health yesterday evening around 8 or 9 PM.  He woke up this morning around 8 or 9 AM and felt like he was lightheaded when standing up.  He said he felt like he was off balance.  He also reports that he had some tingling in his left hand and forearm.  He reports he felt some numbness and tingling around his lips.  He also reports a sensation of heaviness in both of his feet.  He has chronic peripheral neuropathy in his feet with poor sensation at baseline, but feels like they are "more numb and heavy" than usual.  He denies fevers, chills.  He denies CP or SOB.  He denies hx of MI or cardiac stents.  He denies hx of stroke.  He denies headache.    He does not smoke but chews tobacco.   HPI     Past Medical History:  Diagnosis Date  . Arthritis   . Hyperlipidemia   . Hypertension     Patient Active Problem List   Diagnosis Date Noted  . Hypocalcemia 04/03/2019  . Prolonged QT interval 04/03/2019  . HTN (hypertension) 04/03/2019  . HLD (hyperlipidemia) 04/03/2019  . Weakness 04/03/2019    History reviewed. No pertinent family history.  Social History   Tobacco Use  . Smoking status: Never Smoker  . Smokeless tobacco: Current User    Types: Chew  Substance Use Topics  . Alcohol use: Never  . Drug use: Not on file    Home Medications Prior to Admission medications   Medication Sig Start Date End Date Taking? Authorizing Provider  amLODipine (NORVASC) 5 MG tablet Take 5 mg by mouth daily. 01/11/19  Yes [provider]  atorvastatin (LIPITOR) 20 MG tablet Take 20 mg by mouth at bedtime. 01/25/19   Yes [provider]  traMADol (ULTRAM) 50 MG tablet Take 50 mg by mouth 3 (three) times daily as needed. 02/21/19  Yes [provider]  zolpidem (AMBIEN) 10 MG tablet Take 10 mg by mouth at bedtime as needed. 03/28/19  Yes [provider]    Allergies    Patient has no known allergies.  Review of Systems   Review of Systems  Constitutional: Negative for chills and fever.  Eyes: Negative for photophobia and visual disturbance.  Respiratory: Negative for cough and shortness of breath.   Cardiovascular: Negative for chest pain and palpitations.  Gastrointestinal: Negative for abdominal pain, nausea and vomiting.  Musculoskeletal: Negative for arthralgias and myalgias.  Neurological: Positive for weakness, light-headedness and numbness. Negative for syncope, facial asymmetry, speech difficulty and headaches.  All other systems reviewed and are negative.   Physical Exam Updated Vital Signs BP (!) 156/80 (BP Location: Left Arm)   Pulse 62   Temp 97.9 F (36.6 C) (Oral)   Resp 20   Ht 6' (1.829 m)   Wt 101 kg   SpO2 97%   BMI 30.20 kg/m   Physical Exam Vitals and nursing note reviewed.  Constitutional:      Appearance: He is well-developed.  HENT:     Head: Normocephalic and atraumatic.  Eyes:  Conjunctiva/sclera: Conjunctivae normal.  Cardiovascular:     Rate and Rhythm: Normal rate and regular rhythm.     Pulses: Normal pulses.  Pulmonary:     Effort: Pulmonary effort is normal. No respiratory distress.  Abdominal:     Palpations: Abdomen is soft.     Tenderness: There is no abdominal tenderness.  Musculoskeletal:     Cervical back: Neck supple.  Skin:    General: Skin is warm and dry.  Neurological:     Mental Status: He is alert.     GCS: GCS eye subscore is 4. GCS verbal subscore is 5. GCS motor subscore is 6.     Cranial Nerves: Cranial nerves are intact.     Sensory: Sensation is intact.     Motor: Motor function is intact. No  pronator drift.     Coordination: Coordination is intact.     Comments: NIHSS 0 Requires assistance to stand and transfer to bed  Psychiatric:        Mood and Affect: Mood normal.        Behavior: Behavior normal.     ED Results / Procedures / Treatments   Labs (all labs ordered are listed, but only abnormal results are displayed) Labs Reviewed  COMPREHENSIVE METABOLIC PANEL - Abnormal; Notable for the following components:      Result Value   Glucose, Bld 150 (*)    Creatinine, Ser 1.36 (*)    Calcium 6.1 (*)    GFR calc non Af Amer 48 (*)    GFR calc Af Amer 56 (*)    All other components within normal limits  CBC WITH DIFFERENTIAL/PLATELET - Abnormal; Notable for the following components:   Monocytes Absolute 1.2 (*)    All other components within normal limits  URINALYSIS, ROUTINE W REFLEX MICROSCOPIC - Abnormal; Notable for the following components:   Protein, ur 30 (*)    All other components within normal limits  CBG MONITORING, ED - Abnormal; Notable for the following components:   Glucose-Capillary 143 (*)    All other components within normal limits  SARS CORONAVIRUS 2 (TAT 6-24 HRS)  MAGNESIUM  TSH  PHOSPHORUS  PTH, INTACT AND CALCIUM  CALCIUM, IONIZED  VITAMIN D 25 HYDROXY (VIT D DEFICIENCY, FRACTURES)  HEMOGLOBIN A1C  LIPID PANEL  BASIC METABOLIC PANEL  CBC  TROPONIN I (HIGH SENSITIVITY)  TROPONIN I (HIGH SENSITIVITY)    EKG EKG Interpretation  Date/Time:  Sunday April 03 2019 10:09:48 EDT Ventricular Rate:  79 PR Interval:    QRS Duration: 113 QT Interval:  458 QTC Calculation: 526 R Axis:   -8 Text Interpretation: AV dissociation Incomplete left bundle branch block Prolonged QT interval No STEMI Confirmed by Octaviano Glow [phone]) on 04/03/2019 10:29:31 AM   Radiology CT Head Wo Contrast  Result Date: 04/03/2019 CLINICAL DATA:  Neuro deficits.  Subacute left hand tingling. EXAM: CT HEAD WITHOUT CONTRAST TECHNIQUE: Contiguous axial images  were obtained from the base of the skull through the vertex without intravenous contrast. COMPARISON:  April 11, 2010 FINDINGS: Brain: No subdural, epidural, or subarachnoid hemorrhage. Cerebellum, brainstem, and basal cisterns are normal. Ventricles and sulci are mildly prominent. White matter changes are identified. No acute cortical ischemia or infarct. No mass effect or midline shift. Vascular: Calcified atherosclerosis is seen in the intracranial carotids. Skull: Normal. Negative for fracture or focal lesion. Sinuses/Orbits: No acute finding. Other: None. IMPRESSION: 1. Chronic white matter changes. No acute intracranial abnormalities. Electronically Signed   By: Shanon Brow  Mee Hives M.D   On: 04/03/2019 10:41   DG Chest Portable 1 View  Result Date: 04/03/2019 CLINICAL DATA:  Atypical chest pain eval EXAM: PORTABLE CHEST 1 VIEW COMPARISON:  Chest radiograph 04/11/2010 FINDINGS: The cardiac silhouette appears enlarged which may be accentuated by AP technique. Mediastinal contours likely within normal limits given patient rotation. The lungs are clear. No pneumothorax or significant pleural effusion. No acute finding in the visualized skeleton. IMPRESSION: Enlarged cardiac silhouette which may be accentuated by AP technique. No acute pulmonary finding. Electronically Signed   By: Audie Pinto M.D.   On: 04/03/2019 10:39    Procedures Procedures (including critical care time)  Medications Ordered in ED Medications  calcium-vitamin D (OSCAL WITH D) 500-200 MG-UNIT per tablet 1 tablet (1 tablet Oral Given 04/03/19 1412)  enoxaparin (LOVENOX) injection 40 mg (40 mg Subcutaneous Given 04/03/19 1412)  0.9 %  sodium chloride infusion ( Intravenous New Bag/Given 04/03/19 1403)  amLODipine (NORVASC) tablet 5 mg (has no administration in time range)  atorvastatin (LIPITOR) tablet 20 mg (has no administration in time range)  acetaminophen (TYLENOL) tablet 650 mg (has no administration in time range)     Or  acetaminophen (TYLENOL) suppository 650 mg (has no administration in time range)  prochlorperazine (COMPAZINE) injection 10 mg (has no administration in time range)  zolpidem (AMBIEN) tablet 5 mg (has no administration in time range)  calcium gluconate 1 g/ 50 mL sodium chloride IVPB (0 g Intravenous Stopped 04/03/19 1311)  calcium gluconate 1 g/ 50 mL sodium chloride IVPB (1,000 mg Intravenous New Bag/Given 04/03/19 1638)    ED Course  I have reviewed the triage vital signs and the nursing notes.  Pertinent labs & imaging results that were available during my care of the patient were reviewed by me and considered in my medical decision making (see chart for details).  84 yo male presenting with 1 day of perioral tingling, left hand paresthesia, bilateral leg "heaviness" and lightheadedness this morning.  Benign presentation & neuro exam on arrival, with NIHSS 0.  Outside tpa window and no evidence of LVO - no stroke alert initiated.  ECG showed no ACS, and intial trop was 12, the patient had no chest pain.  Workup notable for hypocalcemia in the abscess of hypoalbuminemia or clear cause.  Patient recalling medical list from memory and tells me he is NOT on digoxin.    ECG with prolonged QTc ~ 520 with PVC's visible on telemetry monitor in the room.  He will need telemetry admission for calcium repletion and further diagnostic workup for underlying cause of hypocalcemia.  No constipation (he has regular BM), abdominal pain, or confusion at home per his daughter's report.  Clinical Course as of Apr 03 1850  Sun Apr 03, 2019  1025 Patient assessed on arrival with reports of left han paresthesia, bilateral leg heaviness, perioral tingling, and transient lightheadedness this morning.  He has an NIHSS 0 (controlling for his baseline foot neuropathy) with no evidence of LVO.  No stroke alert at this time.  We'll perform a cardio-metabolic workup in addition to [facility].   [MT]  1050 Calcium(!!): 6.1  [MT]  1050 Symptoms could certainly be consistent with hypocalcemia, particularly numbness around mouth.  Albumin normal here and Cr within baseline limits for him. Will add on magnesium, parathyroid levels.   No evidence of acute stroke on CTH, I am less suspicious of stroke at this time.   [MT]  1140 Signed out to hospitalist   [MT]  Clinical Course User Index [MT] Anahli Arvanitis, Carola Rhine, MD    Final Clinical Impression(s) / ED Diagnoses Final diagnoses:  Hypocalcemia  Paresthesia  Prolonged QT interval    Rx / DC Orders ED Discharge Orders    None       Wyvonnia Dusky, MD 04/03/19 [phone]

## 2019-04-03 NOTE — ED Triage Notes (Addendum)
Patient brought to ED by daughter. Patient c/o heaviness in feet bilaterally with tingling in left hand and face. Patient also reported to daughter this morning some dizziness. Patient has new onset of confusion/disorientation per daughter. Patient last well known at 9pm yesterday. Per daughter she was notified by her mother at around 8:45 of patient's complaints. Per patient all symptoms started this morning upon waking. Patient woke approx 8am. EDP in room to assess patient.

## 2019-04-04 DIAGNOSIS — R531 Weakness: Secondary | ICD-10-CM | POA: Diagnosis not present

## 2019-04-04 DIAGNOSIS — R9431 Abnormal electrocardiogram [ECG] [EKG]: Secondary | ICD-10-CM

## 2019-04-04 DIAGNOSIS — I10 Essential (primary) hypertension: Secondary | ICD-10-CM | POA: Diagnosis not present

## 2019-04-04 DIAGNOSIS — Z20822 Contact with and (suspected) exposure to covid-19: Secondary | ICD-10-CM | POA: Diagnosis not present

## 2019-04-04 DIAGNOSIS — E785 Hyperlipidemia, unspecified: Secondary | ICD-10-CM | POA: Diagnosis not present

## 2019-04-04 DIAGNOSIS — R202 Paresthesia of skin: Secondary | ICD-10-CM | POA: Diagnosis not present

## 2019-04-04 LAB — CBC
HCT: 42.8 % (ref 39.0–52.0)
Hemoglobin: 13.9 g/dL (ref 13.0–17.0)
MCH: 30.8 pg (ref 26.0–34.0)
MCHC: 32.5 g/dL (ref 30.0–36.0)
MCV: 94.7 fL (ref 80.0–100.0)
Platelets: 251 10*3/uL (ref 150–400)
RBC: 4.52 MIL/uL (ref 4.22–5.81)
RDW: 12.8 % (ref 11.5–15.5)
WBC: 9.8 10*3/uL (ref 4.0–10.5)
nRBC: 0 % (ref 0.0–0.2)

## 2019-04-04 LAB — LIPID PANEL
Cholesterol: 129 mg/dL (ref 0–200)
HDL: 36 mg/dL — ABNORMAL LOW (ref >40)
LDL Cholesterol: 77 mg/dL (ref 0–99)
Total CHOL/HDL Ratio: 3.6 RATIO
Triglycerides: 79 mg/dL (ref <150)
VLDL: 16 mg/dL (ref 0–40)

## 2019-04-04 LAB — PTH, INTACT AND CALCIUM
Calcium, Total (PTH): 6.1 mg/dL — CL (ref 8.6–10.2)
PTH: 31 pg/mL (ref 15–65)

## 2019-04-04 LAB — BASIC METABOLIC PANEL
Anion gap: 10 (ref 5–15)
BUN: 17 mg/dL (ref 8–23)
CO2: 27 mmol/L (ref 22–32)
Calcium: 7 mg/dL — ABNORMAL LOW (ref 8.9–10.3)
Chloride: 102 mmol/L (ref 98–111)
Creatinine, Ser: 1.19 mg/dL (ref 0.61–1.24)
GFR calc Af Amer: >60 mL/min (ref >60)
GFR calc non Af Amer: 57 mL/min — ABNORMAL LOW (ref >60)
Glucose, Bld: 123 mg/dL — ABNORMAL HIGH (ref 70–99)
Potassium: 3.9 mmol/L (ref 3.5–5.1)
Sodium: 139 mmol/L (ref 135–145)

## 2019-04-04 LAB — SARS CORONAVIRUS 2 (TAT 6-24 HRS): SARS Coronavirus 2: NEGATIVE

## 2019-04-04 MED ORDER — CALCIUM CARBONATE-VITAMIN D 500-200 MG-UNIT PO TABS: 1.0000 | ORAL_TABLET | Freq: Two times a day (BID) | ORAL | 2 refills | Status: DC

## 2019-04-04 MED ORDER — VITAMIN D (ERGOCALCIFEROL) 1.25 MG (50000 UNIT) PO CAPS
50000.0000 [IU] | ORAL_CAPSULE | ORAL | Status: DC
  Administered 2019-04-04: 09:00:00 50000 [IU] via ORAL
  Filled 2019-04-04: qty 1

## 2019-04-04 MED ORDER — CALCIUM CARBONATE-VITAMIN D 500-200 MG-UNIT PO TABS
2.0000 | ORAL_TABLET | Freq: Two times a day (BID) | ORAL | Status: DC
  Administered 2019-04-04: 2 via ORAL
  Filled 2019-04-04: qty 2

## 2019-04-04 MED ORDER — MAGNESIUM SULFATE 2 GM/50ML IV SOLN
2.0000 g | Freq: Once | INTRAVENOUS | Status: AC
  Administered 2019-04-04: 05:00:00 2 g via INTRAVENOUS
  Filled 2019-04-04: qty 50

## 2019-04-04 MED ORDER — CALCIUM GLUCONATE-NACL 1-0.675 GM/50ML-% IV SOLN
1.0000 g | Freq: Once | INTRAVENOUS | Status: AC
  Administered 2019-04-04: 1000 mg via INTRAVENOUS
  Filled 2019-04-04: qty 50

## 2019-04-04 MED ORDER — VITAMIN D (ERGOCALCIFEROL) 1.25 MG (50000 UNIT) PO CAPS: 50000.0000 [IU] | ORAL_CAPSULE | ORAL | 0 refills | Status: DC

## 2019-04-04 MED ORDER — METOPROLOL TARTRATE 5 MG/5ML IV SOLN
5.0000 mg | Freq: Once | INTRAVENOUS | Status: AC
  Administered 2019-04-04: 5 mg via INTRAVENOUS
  Filled 2019-04-04: qty 5

## 2019-04-04 NOTE — Clinical Social Work Note (Signed)
Spoke with granddaughter, Curtis Jenkins, she is agreeable to HHPT. Did not have [place] preference. [name] with [place] accepts referral for HHPT. Advised of discharge today.    [name], Clydene Pugh, LCSW

## 2019-04-04 NOTE — Progress Notes (Signed)
Received call from telemetry. Patient had 8 beat run of NSVT. Was in the room with patient during the episode. Denies SOB or chest pain. MD Olevia Bowens made aware via [place].

## 2019-04-04 NOTE — Progress Notes (Signed)
Discharge instructions given.. patient verbalized understanding for f/u appts and new medications. Verbalized understanding of getting new prescriptions at [facility]. No acute distress noted. Left unit via w/c.

## 2019-04-04 NOTE — Plan of Care (Signed)
  Problem: Acute Rehab PT Goals(only PT should resolve) Goal: Pt Will Go Supine/Side To Sit Outcome: Progressing Flowsheets (Taken 04/04/2019 1028) Pt will go Supine/Side to Sit:  Independently  with modified independence Goal: Patient Will Transfer Sit To/From Stand Outcome: Progressing Flowsheets (Taken 04/04/2019 1028) Patient will transfer sit to/from stand:  with min guard assist  with supervision Goal: Pt Will Transfer Bed To Chair/Chair To Bed Outcome: Progressing Flowsheets (Taken 04/04/2019 1028) Pt will Transfer Bed to Chair/Chair to Bed: min guard assist Goal: Pt Will Ambulate Outcome: Progressing Flowsheets (Taken 04/04/2019 1028) Pt will Ambulate:  50 feet  with min guard assist  with minimal assist Note: Pick up walker   10:29 AM, 04/04/19 Lonell Grandchild, MPT Physical Therapist with [facility] [phone] office [phone] mobile phone

## 2019-04-04 NOTE — Evaluation (Signed)
Physical Therapy Evaluation Patient Details Name: [name] MRN: [id number] DOB: Oct 08, 1935 Today's Date: 04/04/2019   History of Present Illness  Curtis Jenkins is a 84 y.o. male with a past medical history significant for arthritis, hyperlipidemia, hypertension and insomnia; who presented to the hospital secondary to weakness, off balance, heavy/numbness in his feet and hands and facial paresthesia.  Patient reports symptoms have been present for the last 24 hours and worsening.  They were worse after waking up this morning and trigger taken patient to the hospital for further evaluation and management.  He reports no focal weakness, no chest pain, no shortness of breath, no nausea, no vomiting, no abdominal pain, no dysuria, no hematuria, no melena, no hematochezia, no headaches, no blurred vision, no cough, no rhinorrhea or any other complaints.    Clinical Impression  Patient limited for functional mobility as stated below secondary to BLE weakness, fatigue and poor standing balance.  Patient unable to sit to stand without AD due to weakness, required use of RW, but tends to push RW to far in front requiring verbal cues to step closer to RW with fair carryover, no loss of balance and limited secondary to c/o fatigue.  Patient continued sitting up in chair with his daughters present after therapy.  Patient will benefit from continued physical therapy in hospital and recommended venue below to increase strength, balance, endurance for safe ADLs and gait.     Follow Up Recommendations Home health PT;Supervision for mobility/OOB;Supervision - Intermittent    Equipment Recommendations  None recommended by PT    Recommendations for Other Services       Precautions / Restrictions Precautions Precautions: Fall Restrictions Weight Bearing Restrictions: No      Mobility  Bed Mobility Overal bed mobility: Modified Independent             General bed mobility comments: increased  time  Transfers Overall transfer level: Needs assistance Equipment used: Rolling walker (2 wheeled) Transfers: Sit to/from [facility] Sit to Stand: Min guard;Min assist Stand pivot transfers: Min assist       General transfer comment: unable to complete sit to stand without AD secondary to weakness, required use of RW  Ambulation/Gait Ambulation/Gait assistance: Min assist Gait Distance (Feet): 40 Feet Assistive device: Rolling walker (2 wheeled) Gait Pattern/deviations: Decreased step length - right;Decreased step length - left;Decreased stride length Gait velocity: decreased   General Gait Details: labored cadence with tendency to push RW to fair in front requiring verbal/tactile cue to step closer to RW with fair carryover, limited secondary to c/o fatigue  Stairs            Wheelchair Mobility    Modified Rankin (Stroke Patients Only)       Balance Overall balance assessment: Needs assistance Sitting-balance support: Feet supported;No upper extremity supported Sitting balance-Leahy Scale: Good Sitting balance - Comments: seated at EOB   Standing balance support: During functional activity;No upper extremity supported Standing balance-Leahy Scale: Poor Standing balance comment: fair using RW                             Pertinent Vitals/Pain Pain Assessment: 0-10 Pain Score: 2  Pain Location: chronic low back pain, numbness in feet and lips Pain Descriptors / Indicators: Aching;Discomfort;Numbness Pain Intervention(s): Limited activity within patient's tolerance;Monitored during session    Home Living Family/patient expects to be discharged to:: Private residence Living Arrangements: Spouse/significant other Available Help at  Discharge: Family;Available PRN/intermittently Type of Home: House Home Access: Level entry     Home Layout: Multi-level;Able to live on main level with bedroom/bathroom Home Equipment: Walker -  standard;Bedside commode      Prior Function Level of Independence: Independent         Comments: household ambulator without AD, uses walking stick PRN when walking outside     Hand Dominance        Extremity/Trunk Assessment   Upper Extremity Assessment Upper Extremity Assessment: Overall WFL for tasks assessed    Lower Extremity Assessment Lower Extremity Assessment: Generalized weakness    Cervical / Trunk Assessment Cervical / Trunk Assessment: Kyphotic  Communication   Communication: No difficulties  Cognition Arousal/Alertness: Awake/alert Behavior During Therapy: WFL for tasks assessed/performed Overall Cognitive Status: Within Functional Limits for tasks assessed                                        General Comments      Exercises     Assessment/Plan    PT Assessment Patient needs continued PT services  PT Problem List Decreased strength;Decreased activity tolerance;Decreased balance;Decreased mobility       PT Treatment Interventions Gait training;Stair training;Functional mobility training;Therapeutic activities;Therapeutic exercise;Patient/family education;Balance training    PT Goals (Current goals can be found in the Care Plan section)  Acute Rehab PT Goals Patient Stated Goal: return home with family to assist PT Goal Formulation: With patient/family Time For Goal Achievement: 04/08/19 Potential to Achieve Goals: Good    Frequency Min 3X/week   Barriers to discharge        Co-evaluation               AM-PAC PT "6 Clicks" Mobility  Outcome Measure Help needed turning from your back to your side while in a flat bed without using bedrails?: None Help needed moving from lying on your back to sitting on the side of a flat bed without using bedrails?: None Help needed moving to and from a bed to a chair (including a wheelchair)?: A Little Help needed standing up from a chair using your arms (e.g., wheelchair or  bedside chair)?: A Little Help needed to walk in hospital room?: A Lot Help needed climbing 3-5 steps with a railing? : A Lot 6 Click Score: 18    End of Session   Activity Tolerance: Patient tolerated treatment well;Patient limited by fatigue Patient left: in chair;with call bell/phone within reach;with family/visitor present Nurse Communication: Mobility status PT Visit Diagnosis: Other abnormalities of gait and mobility (R26.89);Unsteadiness on feet (R26.81);Muscle weakness (generalized) (M62.81)    Time: 8916-9450 PT Time Calculation (min) (ACUTE ONLY): 20 min   Charges:   PT Evaluation $PT Eval Moderate Complexity: 1 Mod PT Treatments $Therapeutic Activity: 8-22 mins        10:26 AM, 04/04/19 Lonell Grandchild, MPT Physical Therapist with [facility] [phone] office [phone] mobile phone

## 2019-04-04 NOTE — Care Management Obs Status (Signed)
[facility] NOTIFICATION   Patient Details  Name: Curtis Jenkins MRN: [id number] Date of Birth: 04-09-1935   Medicare Observation Status Notification Given:  Yes    Tommy Medal 04/04/2019, 1:36 PM

## 2019-04-04 NOTE — Progress Notes (Signed)
[facility] night shift.  The staff reports that the patient had an 8 beat NSVT run.  He has been treated for hypocalcemia. His most recent vital signs temperature 98.3 F, pulse 78, respiration 19, blood pressure 165/85 mmHg and O2 sat 98% on room air.  His BP is trending high her during the shift. He takes amlodipine at home for hypertension.  I will order 5 mg of metoprolol IVP x1 and magnesium sulfate 2 g IVPB.  Tennis Must, MD

## 2019-04-04 NOTE — Discharge Summary (Signed)
Physician Discharge Summary  Curtis Jenkins AGT:[id number] DOB: 09-03-1935 DOA: 04/03/2019  PCP: Redmond School, MD  Admit date: 04/03/2019 Discharge date: 04/04/2019  Time spent: 35 minutes  Recommendations for Outpatient Follow-up:  1. Repeat basic metabolic panel to follow electrolytes trend 2. Repeat vitamin D level in 8-10 weeks.   Discharge Diagnoses:  Principal Problem:   Hypocalcemia Active Problems:   Prolonged QT interval   HTN (hypertension)   HLD (hyperlipidemia)   Weakness   Paresthesia   Discharge Condition: Stable and improved.  Discharged home with instruction to follow-up with PCP.  Home health PT arranged at time of discharge.  CODE STATUS: Full code  Diet recommendation: Healthy diet.  Filed Weights   04/03/19 0954 04/03/19 1343  Weight: 108.9 kg 101 kg    History of present illness:  84 y.o. male with a past medical history significant for arthritis, hyperlipidemia, hypertension and insomnia; who presented to the hospital secondary to weakness, off balance, heavy/numbness in his feet and hands and facial paresthesia.  Patient reports symptoms have been present for the last 24 hours and worsening.  They were worse after waking up this morning and trigger taken patient to the hospital for further evaluation and management.  He reports no focal weakness, no chest pain, no shortness of breath, no nausea, no vomiting, no abdominal pain, no dysuria, no hematuria, no melena, no hematochezia, no headaches, no blurred vision, no cough, no rhinorrhea or any other complaints.  In the hospital extensive work-up demonstrated no acute or normalities on chest x-ray or CT head; normal potassium, renal function, magnesium and phosphorus level.  Significant abnormalities appreciated on his calcium level of 6.1 with EKG demonstrating prolongation of QT and telemetry in the ED with PVCs.  [facility] has been consulted to admit patient for further relation and management of symptomatic  hypocalcemia.  Hospital Course:  1-symptomatic hypocalcemia/vitamin D deficiency. -Patient with characteristic tingling, numbness and paresthesia symptoms affecting both feet hands face and perioral area. -Also complaining of weakness and per family reports off balance. -CT head negative for acute abnormalities -Electrocardiogram demonstrated prolonged QT and telemetry has shown some PVCs with sinus rhythm. -Acute repletion with oral and IV calcium provided and patient discharged on Os-Cal 500 mg twice a day. -PTH, TSH phosphorus and magnesium within normal limits; vitamin D found to be low in the 9 range. -Vitamin D 50,000 units weekly has been prescribed at discharge. -No medications on his home regimen list associated with low calcium.  2-Prolonged QT interval -Most likely associated with electrolyte abnormalities -Medications that can prolonged QT were hold and avoided during hospitalization  3-HTN (hypertension) -Continue use of amlodipine -Heart healthy diet has been ordered. -Blood pressure stable and well controlled.  4-HLD (hyperlipidemia) -Continue Lipitor -Heart healthy diet has been encouraged.  5-Weakness -Physical therapy has recommended home health PT for conditioning and supervision  6-insomnia -Continue the use of Ambien nightly as needed.  7-arthritis -QT corrected at discharge in the 500 range; was around 526 on presentation. -No abnormalities on telemetry. -Will resume the use of as needed tramadol.    Procedures: See below for x-ray reports.  Consultations:  None  Discharge Exam: Vitals:   04/04/19 0747 04/04/19 0852  BP:  118/74  Pulse:  70  Resp:  20  Temp:    SpO2: 96% 99%    General: Afebrile, no chest pain, no nausea, no vomiting.  Still reporting some numbness in his feet and intermittent paresthesia; but no focal deficits and asking to  go home. Cardiovascular: S1 and S2, no rubs, no gallops, no JVD on exam. Respiratory: Clear  to auscultation bilaterally; good oxygen saturation on room air. Normal respiratory effort. Abdomen: Soft, nontender, nondistended, positive bowel sounds Extremities: No cyanosis, no clubbing.  Discharge Instructions   Discharge Instructions    Diet - low sodium heart healthy   Complete by: As directed    Discharge instructions   Complete by: As directed    Take medications as prescribed Maintain adequate hydration Arrange follow-up with PCP in 10 days Increase outdoor activities and sunshine exposure.     Allergies as of 04/04/2019   No Known Allergies     Medication List    TAKE these medications   amLODipine 5 MG tablet Commonly known as: NORVASC Take 5 mg by mouth daily.   atorvastatin 20 MG tablet Commonly known as: LIPITOR Take 20 mg by mouth at bedtime.   calcium-vitamin D 500-200 MG-UNIT tablet Commonly known as: OSCAL WITH D Take 1 tablet by mouth 2 (two) times daily.   traMADol 50 MG tablet Commonly known as: ULTRAM Take 50 mg by mouth 3 (three) times daily as needed.   Vitamin D (Ergocalciferol) 1.25 MG (50000 UNIT) Caps capsule Commonly known as: DRISDOL Take 1 capsule (50,000 Units total) by mouth every 7 (seven) days. Start taking on: April 11, 2019   zolpidem 10 MG tablet Commonly known as: AMBIEN Take 10 mg by mouth at bedtime as needed.      No Known Allergies Follow-up Information    Redmond School, MD. Schedule an appointment as soon as possible for a visit in 10 day(s).   Specialty: Internal Medicine Contact information: [address] [place] [address] [phone]           The results of significant diagnostics from this hospitalization (including imaging, microbiology, ancillary and laboratory) are listed below for reference.    Significant Diagnostic Studies: CT Head Wo Contrast  Result Date: 04/03/2019 CLINICAL DATA:  Neuro deficits.  Subacute left hand tingling. EXAM: CT HEAD WITHOUT CONTRAST TECHNIQUE:  Contiguous axial images were obtained from the base of the skull through the vertex without intravenous contrast. COMPARISON:  April 11, 2010 FINDINGS: Brain: No subdural, epidural, or subarachnoid hemorrhage. Cerebellum, brainstem, and basal cisterns are normal. Ventricles and sulci are mildly prominent. White matter changes are identified. No acute cortical ischemia or infarct. No mass effect or midline shift. Vascular: Calcified atherosclerosis is seen in the intracranial carotids. Skull: Normal. Negative for fracture or focal lesion. Sinuses/Orbits: No acute finding. Other: None. IMPRESSION: 1. Chronic white matter changes. No acute intracranial abnormalities. Electronically Signed   By: Dorise Bullion III M.D   On: 04/03/2019 10:41   DG Chest Portable 1 View  Result Date: 04/03/2019 CLINICAL DATA:  Atypical chest pain eval EXAM: PORTABLE CHEST 1 VIEW COMPARISON:  Chest radiograph 04/11/2010 FINDINGS: The cardiac silhouette appears enlarged which may be accentuated by AP technique. Mediastinal contours likely within normal limits given patient rotation. The lungs are clear. No pneumothorax or significant pleural effusion. No acute finding in the visualized skeleton. IMPRESSION: Enlarged cardiac silhouette which may be accentuated by AP technique. No acute pulmonary finding. Electronically Signed   By: Audie Pinto M.D.   On: 04/03/2019 10:39    Microbiology: Recent Results (from the past 240 hour(s))  SARS CORONAVIRUS 2 (TAT 6-24 HRS) Nasopharyngeal Nasopharyngeal Swab     Status: None   Collection Time: 04/03/19 11:42 AM   Specimen: Nasopharyngeal Swab  Result Value Ref  Range Status   SARS Coronavirus 2 NEGATIVE NEGATIVE Final    Comment: (NOTE) SARS-CoV-2 target nucleic acids are NOT DETECTED. The SARS-CoV-2 RNA is generally detectable in upper and lower respiratory specimens during the acute phase of infection. Negative results do not preclude SARS-CoV-2 infection, do not rule  out co-infections with other pathogens, and should not be used as the sole basis for treatment or other patient management decisions. Negative results must be combined with clinical observations, patient history, and epidemiological information. The expected result is Negative. Fact Sheet for Patients: SugarRoll.be Fact Sheet for Healthcare Providers: https://www.woods-mathews.com/ This test is not yet approved or cleared by the [place] FDA and  has been authorized for detection and/or diagnosis of SARS-CoV-2 by FDA under an Emergency Use Authorization (EUA). This EUA will remain  in effect (meaning this test can be used) for the duration of the COVID-19 declaration under Section 56 4(b)(1) of the Act, 21 U.S.C. section 360bbb-3(b)(1), unless the authorization is terminated or revoked sooner. Performed at [facility], [place] [address], [place], [place] [address]      Labs: Basic Metabolic Panel: Recent Labs  Lab 04/03/19 0953 04/03/19 1133 04/04/19 0501  NA 137  --  139  K 4.2  --  3.9  CL 101  --  102  CO2 23  --  27  GLUCOSE 150*  --  123*  BUN 19  --  17  CREATININE 1.36*  --  1.19  CALCIUM 6.1* 6.1* 7.0*  MG  --  1.9  --   PHOS  --  3.6  --    Liver Function Tests: Recent Labs  Lab 04/03/19 0953  AST 17  ALT 12  ALKPHOS 99  BILITOT 1.1  PROT 7.5  ALBUMIN 4.2   CBC: Recent Labs  Lab 04/03/19 0953 04/04/19 0501  WBC 10.5 9.8  NEUTROABS 7.3  --   HGB 14.7 13.9  HCT 45.1 42.8  MCV 95.1 94.7  PLT 242 251    CBG: Recent Labs  Lab 04/03/19 0947  GLUCAP 143*    Signed:  Barton Dubois MD.  [facility] 04/04/2019, 12:55 PM

## 2019-04-05 LAB — CALCIUM, IONIZED: Calcium, Ionized, Serum: 3.3 mg/dL — ABNORMAL LOW (ref 4.5–5.6)

## 2019-04-09 ENCOUNTER — Inpatient Hospital Stay (HOSPITAL_COMMUNITY)
Admission: EM | Admit: 2019-04-09 | Discharge: 2019-04-13 | DRG: 041 | Disposition: A | Discharge status: Home Health | Payer: Medicare Other | Attending: Internal Medicine | Admitting: Internal Medicine

## 2019-04-09 ENCOUNTER — Inpatient Hospital Stay (HOSPITAL_COMMUNITY): Payer: Medicare Other

## 2019-04-09 ENCOUNTER — Encounter (HOSPITAL_COMMUNITY): Payer: Self-pay | Admitting: Emergency Medicine

## 2019-04-09 ENCOUNTER — Other Ambulatory Visit (HOSPITAL_COMMUNITY): Payer: Medicare Other

## 2019-04-09 ENCOUNTER — Other Ambulatory Visit: Payer: Self-pay

## 2019-04-09 ENCOUNTER — Emergency Department (HOSPITAL_COMMUNITY): Payer: Medicare Other

## 2019-04-09 DIAGNOSIS — I639 Cerebral infarction, unspecified: Secondary | ICD-10-CM | POA: Diagnosis not present

## 2019-04-09 DIAGNOSIS — Z743 Need for continuous supervision: Secondary | ICD-10-CM | POA: Diagnosis not present

## 2019-04-09 DIAGNOSIS — M25461 Effusion, right knee: Secondary | ICD-10-CM | POA: Diagnosis not present

## 2019-04-09 DIAGNOSIS — Z79891 Long term (current) use of opiate analgesic: Secondary | ICD-10-CM

## 2019-04-09 DIAGNOSIS — F17200 Nicotine dependence, unspecified, uncomplicated: Secondary | ICD-10-CM | POA: Diagnosis present

## 2019-04-09 DIAGNOSIS — I480 Paroxysmal atrial fibrillation: Secondary | ICD-10-CM | POA: Diagnosis not present

## 2019-04-09 DIAGNOSIS — H53462 Homonymous bilateral field defects, left side: Secondary | ICD-10-CM | POA: Diagnosis not present

## 2019-04-09 DIAGNOSIS — E785 Hyperlipidemia, unspecified: Secondary | ICD-10-CM | POA: Diagnosis not present

## 2019-04-09 DIAGNOSIS — I129 Hypertensive chronic kidney disease with stage 1 through stage 4 chronic kidney disease, or unspecified chronic kidney disease: Secondary | ICD-10-CM | POA: Diagnosis not present

## 2019-04-09 DIAGNOSIS — I491 Atrial premature depolarization: Secondary | ICD-10-CM | POA: Diagnosis not present

## 2019-04-09 DIAGNOSIS — I63531 Cerebral infarction due to unspecified occlusion or stenosis of right posterior cerebral artery: Secondary | ICD-10-CM | POA: Diagnosis not present

## 2019-04-09 DIAGNOSIS — I63411 Cerebral infarction due to embolism of right middle cerebral artery: Principal | ICD-10-CM | POA: Diagnosis present

## 2019-04-09 DIAGNOSIS — G8929 Other chronic pain: Secondary | ICD-10-CM | POA: Diagnosis not present

## 2019-04-09 DIAGNOSIS — M549 Dorsalgia, unspecified: Secondary | ICD-10-CM | POA: Diagnosis present

## 2019-04-09 DIAGNOSIS — M25412 Effusion, left shoulder: Secondary | ICD-10-CM | POA: Diagnosis not present

## 2019-04-09 DIAGNOSIS — I4891 Unspecified atrial fibrillation: Secondary | ICD-10-CM | POA: Diagnosis not present

## 2019-04-09 DIAGNOSIS — S46012A Strain of muscle(s) and tendon(s) of the rotator cuff of left shoulder, initial encounter: Secondary | ICD-10-CM | POA: Diagnosis not present

## 2019-04-09 DIAGNOSIS — G8194 Hemiplegia, unspecified affecting left nondominant side: Secondary | ICD-10-CM | POA: Diagnosis not present

## 2019-04-09 DIAGNOSIS — R627 Adult failure to thrive: Secondary | ICD-10-CM | POA: Diagnosis not present

## 2019-04-09 DIAGNOSIS — M19012 Primary osteoarthritis, left shoulder: Secondary | ICD-10-CM | POA: Diagnosis not present

## 2019-04-09 DIAGNOSIS — R2681 Unsteadiness on feet: Secondary | ICD-10-CM | POA: Diagnosis present

## 2019-04-09 DIAGNOSIS — M170 Bilateral primary osteoarthritis of knee: Secondary | ICD-10-CM | POA: Diagnosis present

## 2019-04-09 DIAGNOSIS — R29710 NIHSS score 10: Secondary | ICD-10-CM | POA: Diagnosis present

## 2019-04-09 DIAGNOSIS — R1310 Dysphagia, unspecified: Secondary | ICD-10-CM | POA: Diagnosis not present

## 2019-04-09 DIAGNOSIS — E1122 Type 2 diabetes mellitus with diabetic chronic kidney disease: Secondary | ICD-10-CM | POA: Diagnosis not present

## 2019-04-09 DIAGNOSIS — Z0189 Encounter for other specified special examinations: Secondary | ICD-10-CM

## 2019-04-09 DIAGNOSIS — M1711 Unilateral primary osteoarthritis, right knee: Secondary | ICD-10-CM | POA: Diagnosis not present

## 2019-04-09 DIAGNOSIS — H547 Unspecified visual loss: Secondary | ICD-10-CM | POA: Diagnosis not present

## 2019-04-09 DIAGNOSIS — M25422 Effusion, left elbow: Secondary | ICD-10-CM | POA: Diagnosis present

## 2019-04-09 DIAGNOSIS — M25512 Pain in left shoulder: Secondary | ICD-10-CM | POA: Diagnosis present

## 2019-04-09 DIAGNOSIS — Z4659 Encounter for fitting and adjustment of other gastrointestinal appliance and device: Secondary | ICD-10-CM

## 2019-04-09 DIAGNOSIS — M2550 Pain in unspecified joint: Secondary | ICD-10-CM | POA: Diagnosis not present

## 2019-04-09 DIAGNOSIS — Z4682 Encounter for fitting and adjustment of non-vascular catheter: Secondary | ICD-10-CM | POA: Diagnosis not present

## 2019-04-09 DIAGNOSIS — Z720 Tobacco use: Secondary | ICD-10-CM | POA: Diagnosis present

## 2019-04-09 DIAGNOSIS — I499 Cardiac arrhythmia, unspecified: Secondary | ICD-10-CM | POA: Diagnosis not present

## 2019-04-09 DIAGNOSIS — Z9119 Patient's noncompliance with other medical treatment and regimen: Secondary | ICD-10-CM | POA: Diagnosis not present

## 2019-04-09 DIAGNOSIS — M25462 Effusion, left knee: Secondary | ICD-10-CM | POA: Diagnosis not present

## 2019-04-09 DIAGNOSIS — Z8249 Family history of ischemic heart disease and other diseases of the circulatory system: Secondary | ICD-10-CM

## 2019-04-09 DIAGNOSIS — I6389 Other cerebral infarction: Secondary | ICD-10-CM

## 2019-04-09 DIAGNOSIS — R29810 Facial weakness: Secondary | ICD-10-CM | POA: Diagnosis not present

## 2019-04-09 DIAGNOSIS — G4733 Obstructive sleep apnea (adult) (pediatric): Secondary | ICD-10-CM | POA: Diagnosis present

## 2019-04-09 DIAGNOSIS — Z20822 Contact with and (suspected) exposure to covid-19: Secondary | ICD-10-CM | POA: Diagnosis present

## 2019-04-09 DIAGNOSIS — N182 Chronic kidney disease, stage 2 (mild): Secondary | ICD-10-CM | POA: Diagnosis present

## 2019-04-09 DIAGNOSIS — N184 Chronic kidney disease, stage 4 (severe): Secondary | ICD-10-CM | POA: Diagnosis not present

## 2019-04-09 DIAGNOSIS — M25522 Pain in left elbow: Secondary | ICD-10-CM | POA: Diagnosis not present

## 2019-04-09 DIAGNOSIS — Z79899 Other long term (current) drug therapy: Secondary | ICD-10-CM

## 2019-04-09 DIAGNOSIS — R479 Unspecified speech disturbances: Secondary | ICD-10-CM | POA: Diagnosis present

## 2019-04-09 DIAGNOSIS — R296 Repeated falls: Secondary | ICD-10-CM | POA: Diagnosis not present

## 2019-04-09 DIAGNOSIS — R5381 Other malaise: Secondary | ICD-10-CM | POA: Diagnosis not present

## 2019-04-09 DIAGNOSIS — I10 Essential (primary) hypertension: Secondary | ICD-10-CM | POA: Diagnosis present

## 2019-04-09 DIAGNOSIS — Z6832 Body mass index (BMI) 32.0-32.9, adult: Secondary | ICD-10-CM

## 2019-04-09 DIAGNOSIS — R531 Weakness: Secondary | ICD-10-CM | POA: Diagnosis not present

## 2019-04-09 DIAGNOSIS — I69391 Dysphagia following cerebral infarction: Secondary | ICD-10-CM | POA: Diagnosis not present

## 2019-04-09 DIAGNOSIS — R471 Dysarthria and anarthria: Secondary | ICD-10-CM | POA: Diagnosis not present

## 2019-04-09 DIAGNOSIS — W19XXXA Unspecified fall, initial encounter: Secondary | ICD-10-CM

## 2019-04-09 DIAGNOSIS — Z7401 Bed confinement status: Secondary | ICD-10-CM | POA: Diagnosis not present

## 2019-04-09 LAB — DIFFERENTIAL
Abs Immature Granulocytes: 0.02 10*3/uL (ref 0.00–0.07)
Basophils Absolute: 0.1 10*3/uL (ref 0.0–0.1)
Basophils Relative: 1 %
Eosinophils Absolute: 0.3 10*3/uL (ref 0.0–0.5)
Eosinophils Relative: 3 %
Immature Granulocytes: 0 %
Lymphocytes Relative: 20 %
Lymphs Abs: 2.1 10*3/uL (ref 0.7–4.0)
Monocytes Absolute: 1.3 10*3/uL — ABNORMAL HIGH (ref 0.1–1.0)
Monocytes Relative: 12 %
Neutro Abs: 6.7 10*3/uL (ref 1.7–7.7)
Neutrophils Relative %: 64 %

## 2019-04-09 LAB — HEPATIC FUNCTION PANEL
ALT: 18 U/L (ref 0–44)
AST: 24 U/L (ref 15–41)
Albumin: 4 g/dL (ref 3.5–5.0)
Alkaline Phosphatase: 99 U/L (ref 38–126)
Bilirubin, Direct: 0.2 mg/dL (ref 0.0–0.2)
Indirect Bilirubin: 1 mg/dL — ABNORMAL HIGH (ref 0.3–0.9)
Total Bilirubin: 1.2 mg/dL (ref 0.3–1.2)
Total Protein: 7.3 g/dL (ref 6.5–8.1)

## 2019-04-09 LAB — URINALYSIS, ROUTINE W REFLEX MICROSCOPIC
Bacteria, UA: NONE SEEN
Bilirubin Urine: NEGATIVE
Glucose, UA: NEGATIVE mg/dL
Hgb urine dipstick: NEGATIVE
Ketones, ur: NEGATIVE mg/dL
Leukocytes,Ua: NEGATIVE
Nitrite: NEGATIVE
Protein, ur: 100 mg/dL — AB
Specific Gravity, Urine: 1.016 (ref 1.005–1.030)
pH: 7 (ref 5.0–8.0)

## 2019-04-09 LAB — ETHANOL: Alcohol, Ethyl (B): <10 mg/dL (ref <10)

## 2019-04-09 LAB — BASIC METABOLIC PANEL
Anion gap: 9 (ref 5–15)
BUN: 22 mg/dL (ref 8–23)
CO2: 25 mmol/L (ref 22–32)
Calcium: 7.3 mg/dL — ABNORMAL LOW (ref 8.9–10.3)
Chloride: 101 mmol/L (ref 98–111)
Creatinine, Ser: 1.38 mg/dL — ABNORMAL HIGH (ref 0.61–1.24)
GFR calc Af Amer: 55 mL/min — ABNORMAL LOW (ref >60)
GFR calc non Af Amer: 47 mL/min — ABNORMAL LOW (ref >60)
Glucose, Bld: 122 mg/dL — ABNORMAL HIGH (ref 70–99)
Potassium: 3.6 mmol/L (ref 3.5–5.1)
Sodium: 135 mmol/L (ref 135–145)

## 2019-04-09 LAB — CBC
HCT: 43.4 % (ref 39.0–52.0)
Hemoglobin: 14.5 g/dL (ref 13.0–17.0)
MCH: 31 pg (ref 26.0–34.0)
MCHC: 33.4 g/dL (ref 30.0–36.0)
MCV: 92.7 fL (ref 80.0–100.0)
Platelets: 245 10*3/uL (ref 150–400)
RBC: 4.68 MIL/uL (ref 4.22–5.81)
RDW: 12.4 % (ref 11.5–15.5)
WBC: 10.5 10*3/uL (ref 4.0–10.5)
nRBC: 0 % (ref 0.0–0.2)

## 2019-04-09 LAB — CBG MONITORING, ED: Glucose-Capillary: 104 mg/dL — ABNORMAL HIGH (ref 70–99)

## 2019-04-09 LAB — SARS CORONAVIRUS 2 (TAT 6-24 HRS): SARS Coronavirus 2: NEGATIVE

## 2019-04-09 LAB — RAPID URINE DRUG SCREEN, HOSP PERFORMED
Amphetamines: NOT DETECTED
Barbiturates: NOT DETECTED
Benzodiazepines: NOT DETECTED
Cocaine: NOT DETECTED
Opiates: NOT DETECTED
Tetrahydrocannabinol: NOT DETECTED

## 2019-04-09 LAB — LIPASE, BLOOD: Lipase: 25 U/L (ref 11–51)

## 2019-04-09 LAB — TROPONIN I (HIGH SENSITIVITY)
Troponin I (High Sensitivity): 19 ng/L — ABNORMAL HIGH (ref <18)
Troponin I (High Sensitivity): 19 ng/L — ABNORMAL HIGH (ref <18)

## 2019-04-09 LAB — PROTIME-INR
INR: 1.2 (ref 0.8–1.2)
Prothrombin Time: 14.7 seconds (ref 11.4–15.2)

## 2019-04-09 LAB — APTT: aPTT: 34 seconds (ref 24–36)

## 2019-04-09 MED ORDER — ASPIRIN 300 MG RE SUPP
300.0000 mg | Freq: Every day | RECTAL | Status: DC
  Administered 2019-04-10: 300 mg via RECTAL
  Filled 2019-04-09 (×2): qty 1

## 2019-04-09 MED ORDER — SODIUM CHLORIDE 0.9 % IV SOLN: 1.0000 g | Freq: Once | INTRAVENOUS | Status: DC

## 2019-04-09 MED ORDER — LABETALOL HCL 5 MG/ML IV SOLN: 10.0000 mg | INTRAVENOUS | Status: DC | PRN

## 2019-04-09 MED ORDER — ASPIRIN 325 MG PO TABS
325.0000 mg | ORAL_TABLET | Freq: Once | ORAL | Status: AC
  Administered 2019-04-09: 325 mg via ORAL
  Filled 2019-04-09: qty 1

## 2019-04-09 MED ORDER — SODIUM CHLORIDE 0.9% FLUSH: 3.0000 mL | INTRAVENOUS | Status: DC | PRN

## 2019-04-09 MED ORDER — ATORVASTATIN CALCIUM 40 MG PO TABS
80.0000 mg | ORAL_TABLET | Freq: Once | ORAL | Status: AC
  Administered 2019-04-09: 40 mg via ORAL
  Filled 2019-04-09: qty 2

## 2019-04-09 MED ORDER — VITAMIN D (ERGOCALCIFEROL) 1.25 MG (50000 UNIT) PO CAPS
50000.0000 [IU] | ORAL_CAPSULE | ORAL | Status: DC
  Administered 2019-04-11: 50000 [IU] via ORAL
  Filled 2019-04-09 (×2): qty 1

## 2019-04-09 MED ORDER — ENSURE ENLIVE PO LIQD
237.0000 mL | Freq: Three times a day (TID) | ORAL | Status: DC
  Administered 2019-04-10 – 2019-04-13 (×7): 237 mL via ORAL
  Filled 2019-04-09 (×7): qty 237

## 2019-04-09 MED ORDER — POLYETHYLENE GLYCOL 3350 17 G PO PACK: 17.0000 g | PACK | Freq: Every day | ORAL | Status: DC | PRN

## 2019-04-09 MED ORDER — ATORVASTATIN CALCIUM 80 MG PO TABS
80.0000 mg | ORAL_TABLET | Freq: Every day | ORAL | Status: DC
  Administered 2019-04-10 – 2019-04-12 (×3): 80 mg via ORAL
  Filled 2019-04-09 (×4): qty 1

## 2019-04-09 MED ORDER — IOHEXOL 350 MG/ML SOLN
75.0000 mL | Freq: Once | INTRAVENOUS | Status: AC | PRN
  Administered 2019-04-09: 75 mL via INTRAVENOUS

## 2019-04-09 MED ORDER — SODIUM CHLORIDE 0.9 % IV SOLN: 250.0000 mL | INTRAVENOUS | Status: DC | PRN

## 2019-04-09 MED ORDER — ACETAMINOPHEN 325 MG PO TABS
650.0000 mg | ORAL_TABLET | Freq: Four times a day (QID) | ORAL | Status: DC | PRN
  Administered 2019-04-11: 650 mg via ORAL
  Filled 2019-04-09: qty 2

## 2019-04-09 MED ORDER — HEPARIN SODIUM (PORCINE) 5000 UNIT/ML IJ SOLN
5000.0000 [IU] | Freq: Three times a day (TID) | INTRAMUSCULAR | Status: DC
  Administered 2019-04-09 – 2019-04-13 (×13): 5000 [IU] via SUBCUTANEOUS
  Filled 2019-04-09 (×13): qty 1

## 2019-04-09 MED ORDER — SODIUM CHLORIDE 0.9 % IV SOLN: INTRAVENOUS | Status: DC

## 2019-04-09 MED ORDER — TRAMADOL HCL 50 MG PO TABS
50.0000 mg | ORAL_TABLET | Freq: Three times a day (TID) | ORAL | Status: DC | PRN
  Administered 2019-04-10 – 2019-04-13 (×8): 50 mg via ORAL
  Filled 2019-04-09 (×8): qty 1

## 2019-04-09 MED ORDER — CALCIUM CARBONATE-VITAMIN D 500-200 MG-UNIT PO TABS
1.0000 | ORAL_TABLET | Freq: Two times a day (BID) | ORAL | Status: DC
  Administered 2019-04-10 – 2019-04-13 (×6): 1 via ORAL
  Filled 2019-04-09 (×10): qty 1

## 2019-04-09 MED ORDER — SODIUM CHLORIDE 0.9% FLUSH
3.0000 mL | Freq: Two times a day (BID) | INTRAVENOUS | Status: DC
  Administered 2019-04-09 – 2019-04-13 (×8): 3 mL via INTRAVENOUS

## 2019-04-09 MED ORDER — CALCIUM GLUCONATE-NACL 1-0.675 GM/50ML-% IV SOLN
1.0000 g | Freq: Once | INTRAVENOUS | Status: AC
  Administered 2019-04-09: 1000 mg via INTRAVENOUS
  Filled 2019-04-09: qty 50

## 2019-04-09 MED ORDER — ACETAMINOPHEN 650 MG RE SUPP: 650.0000 mg | Freq: Four times a day (QID) | RECTAL | Status: DC | PRN

## 2019-04-09 NOTE — ED Notes (Signed)
Dr Joesph Fillers. Hospitalist, in room assessing patient and talking to daughter about transfer.

## 2019-04-09 NOTE — ED Notes (Signed)
Spouse to bedside   She reports her husband has had swallowing issues "for months"  She also reports she wonders when her husband will be transferred  She is advised of awaiting bed availability and assignment   She reports she does not mind if we place an NG tube is necessary

## 2019-04-09 NOTE — ED Notes (Signed)
Provider requested status on CT Scan. RN was informed CT was running QA on the scanner and this is reason for delay in Scan on Pt at this time.

## 2019-04-09 NOTE — ED Provider Notes (Signed)
[facility] Provider Note   CSN: [id number] Arrival date & time: 04/09/19  0321     History Chief Complaint  Patient presents with  . Weakness    Curtis Jenkins is a 84 y.o. male.  84 yo M with a chief complaint of left-sided weakness.  Family states that he was in the hospital for hypocalcemia with some paresthesias and his face in the left hand.  States he went home and had some improvement initially and then started having worsening.  Got to the point where he is now much more weak on the left side.  The history is provided by the patient.  Weakness Severity:  Moderate Onset quality:  Gradual Duration:  1 week Timing:  Constant Progression:  Worsening Chronicity:  New Relieved by:  Nothing Worsened by:  Nothing Ineffective treatments:  None tried Associated symptoms: no abdominal pain, no arthralgias, no chest pain, no diarrhea, no fever, no headaches, no myalgias, no shortness of breath and no vomiting        Past Medical History:  Diagnosis Date  . Arthritis   . Hyperlipidemia   . Hypertension     Patient Active Problem List   Diagnosis Date Noted  . Paresthesia   . Hypocalcemia 04/03/2019  . Prolonged QT interval 04/03/2019  . HTN (hypertension) 04/03/2019  . HLD (hyperlipidemia) 04/03/2019  . Weakness 04/03/2019    History reviewed. No pertinent surgical history.     History reviewed. No pertinent family history.  Social History   Tobacco Use  . Smoking status: Never Smoker  . Smokeless tobacco: Current User    Types: Chew  Substance Use Topics  . Alcohol use: Never  . Drug use: Not on file    Home Medications Prior to Admission medications   Medication Sig Start Date End Date Taking? Authorizing Provider  amLODipine (NORVASC) 5 MG tablet Take 5 mg by mouth daily. 01/11/19   [provider]  atorvastatin (LIPITOR) 20 MG tablet Take 20 mg by mouth at bedtime. 01/25/19   [provider]    calcium-vitamin D (OSCAL WITH D) 500-200 MG-UNIT tablet Take 1 tablet by mouth 2 (two) times daily. 04/04/19   Barton Dubois, MD  traMADol (ULTRAM) 50 MG tablet Take 50 mg by mouth 3 (three) times daily as needed. 02/21/19   [provider]  Vitamin D, Ergocalciferol, (DRISDOL) 1.25 MG (50000 UNIT) CAPS capsule Take 1 capsule (50,000 Units total) by mouth every 7 (seven) days. 04/11/19   Barton Dubois, MD  zolpidem (AMBIEN) 10 MG tablet Take 10 mg by mouth at bedtime as needed. 03/28/19   [provider]    Allergies    Patient has no known allergies.  Review of Systems   Review of Systems  Constitutional: Negative for chills and fever.  HENT: Negative for congestion and facial swelling.   Eyes: Negative for discharge and visual disturbance.  Respiratory: Negative for shortness of breath.   Cardiovascular: Negative for chest pain and palpitations.  Gastrointestinal: Negative for abdominal pain, diarrhea and vomiting.  Musculoskeletal: Negative for arthralgias and myalgias.  Skin: Negative for color change and rash.  Neurological: Positive for weakness. Negative for tremors, syncope and headaches.  Psychiatric/Behavioral: Negative for confusion and dysphoric mood.    Physical Exam Updated Vital Signs BP (!) 147/86   Pulse (!) 46   Temp 98.8 F (37.1 C) (Oral)   Resp 20   Ht 6' (1.829 m)   Wt 108.9 kg   SpO2 97%  BMI 32.55 kg/m   Physical Exam Vitals and nursing note reviewed.  Constitutional:      Appearance: He is well-developed.  HENT:     Head: Normocephalic and atraumatic.  Eyes:     Pupils: Pupils are equal, round, and reactive to light.  Neck:     Vascular: No JVD.  Cardiovascular:     Rate and Rhythm: Normal rate and regular rhythm.     Heart sounds: No murmur. No friction rub. No gallop.   Pulmonary:     Effort: No respiratory distress.     Breath sounds: No wheezing.  Abdominal:     General: There is no distension.     Tenderness: There  is no guarding or rebound.  Musculoskeletal:        General: Normal range of motion.     Cervical back: Normal range of motion and neck supple.  Skin:    Coloration: Skin is not pale.     Findings: No rash.  Neurological:     Mental Status: He is alert and oriented to person, place, and time.     Comments: 3-5 strength the left upper extremity.  Trivial right-sided facial droop.  4 out of 5 strength of the left lower extremity.  Psychiatric:        Behavior: Behavior normal.     ED Results / Procedures / Treatments   Labs (all labs ordered are listed, but only abnormal results are displayed) Labs Reviewed  BASIC METABOLIC PANEL - Abnormal; Notable for the following components:      Result Value   Glucose, Bld 122 (*)    Creatinine, Ser 1.38 (*)    Calcium 7.3 (*)    GFR calc non Af Amer 47 (*)    GFR calc Af Amer 55 (*)    All other components within normal limits  DIFFERENTIAL - Abnormal; Notable for the following components:   Monocytes Absolute 1.3 (*)    All other components within normal limits  HEPATIC FUNCTION PANEL - Abnormal; Notable for the following components:   Indirect Bilirubin 1.0 (*)    All other components within normal limits  TROPONIN I (HIGH SENSITIVITY) - Abnormal; Notable for the following components:   Troponin I (High Sensitivity) 19 (*)    All other components within normal limits  TROPONIN I (HIGH SENSITIVITY) - Abnormal; Notable for the following components:   Troponin I (High Sensitivity) 19 (*)    All other components within normal limits  SARS CORONAVIRUS 2 (TAT 6-24 HRS)  CBC  LIPASE, BLOOD  ETHANOL  PROTIME-INR  APTT  RAPID URINE DRUG SCREEN, HOSP PERFORMED  URINALYSIS, ROUTINE W REFLEX MICROSCOPIC    EKG EKG Interpretation  Date/Time:  Saturday April 09 2019 03:52:11 EDT Ventricular Rate:  87 PR Interval:    QRS Duration: 117 QT Interval:  440 QTC Calculation: 472 R Axis:   -17 Text Interpretation: Normal sinus rhythm Paired  ventricular premature complexes Incomplete left bundle branch block Low voltage, precordial leads frequent pvcs Otherwise no significant change Confirmed by Deno Etienne [phone]) on 04/09/2019 4:31:31 AM   Radiology No results found.  Procedures Procedures (including critical care time)  Medications Ordered in ED Medications  calcium gluconate 1 g/ 50 mL sodium chloride IVPB (0 g Intravenous Stopped 04/09/19 0539)    ED Course  I have reviewed the triage vital signs and the nursing notes.  Pertinent labs & imaging results that were available during my care of the patient were reviewed by  me and considered in my medical decision making (see chart for details).    MDM Rules/Calculators/A&P                      84 yo M with a chief complaint of left-sided weakness.  Patient was just in the hospital a week ago for paresthesias it was thought to be due to hypocalcemia.  Has had worsening unilaterality over the week.  Most consistent with a stroke at this point.  Will obtain a CT. CT scan with a new subacute stroke as viewed by me.  I discussed this with the neurologist at [facility], Dr. Lorraine Lax.  He recommended hospitalist admission to [facility].  The patients results and plan were reviewed and discussed.   Any x-rays performed were independently reviewed by myself.   Differential diagnosis were considered with the presenting HPI.  Medications  calcium gluconate 1 g/ 50 mL sodium chloride IVPB (0 g Intravenous Stopped 04/09/19 0539)    Vitals:   04/09/19 0500 04/09/19 0515 04/09/19 0530 04/09/19 0630  BP: (!) 143/89  (!) 145/86 (!) 147/86  Pulse: 61 71 (!) 51 (!) 46  Resp:  18 17 20   Temp:      TempSrc:      SpO2: 98%  93% 97%  Weight:      Height:        Final diagnoses:  Stroke due to embolism of right middle cerebral artery (HCC)    Admission/ observation were discussed with the admitting physician, patient and/or family and they are comfortable with the plan.   Final Clinical  Impression(s) / ED Diagnoses Final diagnoses:  Stroke due to embolism of right middle cerebral artery [facility])    Rx / DC Orders ED Discharge Orders    None       Deno Etienne, DO 04/09/19 [phone]

## 2019-04-09 NOTE — ED Notes (Signed)
Call to [phone]  To give reports   Receiving RN will call me back

## 2019-04-09 NOTE — ED Notes (Signed)
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

## 2019-04-09 NOTE — Progress Notes (Signed)
Pt had NG present upon assessment. X-ray recommendation was to further advance. Nurse and Lannette Donath RN, advanced NG but was unsuccessful. Patient constantly coughs, was uncooperative with swallowing. Nurse tried again with a new NG tube, which was unsuccessful as well. NG goes down fine until patient starts coughing, tube ends up in patients mouth. NG tube pulled, MD notified.

## 2019-04-09 NOTE — Consult Note (Addendum)
NEURO HOSPITALIST  CONSULT   Requesting Physician: Dr. Denton Brick    Chief Complaint: left side weakness/ speech problems  History obtained from:  Patient  / Chart review  HPI:                                                                                                                                         Curtis Jenkins is an 84 y.o. male  With PMH HTN, HLD who presented to [facility] for left side weakness, speech problems and dysphagia. transferred to [facility]  Per patient he stated that he had some weakness and problems walking for about 1 week. Uses a walker to ambulate at baseline due to arthritis. Denies ETOH, smoking,drug use, vision changes. Does chew tobacco daily.  Endorses that he became weaker and that he had increased difficulty walking. Per chart review patient was seen 3/21 for unsteady gait, weakness, had tingling and numbness. CTH at that time showed no acute intracranial abnormalities. He was treated for hypocalcemia at that time and symptoms improved with administration of calcium.   Continued to have difficulty with left side of the body - with both sensory and motor system. Came in today to [facility].  CT head was done that showed a large right PCA territory infarct.  [facility]  Hospital course:  CTH: hypodensity of right occipital lobe. Acute/subacute infarct. BP: 138/82 BG: 104 creatinine 1.34 Transferred to [facility] ED for neurology   Modified Rankin: 3 NIHSS:10  Past Medical History:  Diagnosis Date  . Arthritis   . Hyperlipidemia   . Hypertension     History reviewed. No pertinent surgical history.  History reviewed. No pertinent family history.       Social History:  reports that he has never smoked. His smokeless tobacco use includes chew. He reports that he does not drink alcohol. No history on file for drug.  Allergies: No Known Allergies  Medications:  Scheduled: . aspirin  300 mg Rectal Daily  . atorvastatin  80 mg Oral QHS  . calcium-vitamin D  1 tablet Oral BID WC  . feeding supplement (ENSURE ENLIVE)  237 mL Oral TID PC  . heparin  5,000 Units Subcutaneous Q8H  . sodium chloride flush  3 mL Intravenous Q12H  . [START ON 04/11/2019] Vitamin D (Ergocalciferol)  50,000 Units Oral Q7 days   Continuous: . sodium chloride     QIW:LNLGXQ chloride, acetaminophen **OR** acetaminophen, labetalol, polyethylene glycol, sodium chloride flush   ROS:                                                                                                                                       ROS was performed and is negative except as noted in HPI    General Examination:                                                                                                      Blood pressure 101/88, pulse 76, temperature 98.6 F (37 C), temperature source Oral, resp. rate 17, height 6' (1.829 m), weight 108.9 kg, SpO2 98 %.  Physical Exam  Constitutional: Appears well-developed and well-nourished.  Psych: Affect appropriate to situation Eyes: Normal external eye and conjunctiva. HENT: Normocephalic, no lesions, without obvious abnormality.   Musculoskeletal- left foot with contractures Cardiovascular: Normal rate and regular rhythm.  Respiratory: Effort normal, non-labored breathing saturations WNL on RA GI: Soft.  No distension. There is no tenderness.  Skin: WDI  Neurological Examination Mental Status: Alert, oriented name/age/month/year, thought content appropriate.  Severely dysarthric. Speech fluent without evidence of aphasia.  Able to follow commands without difficulty. Cranial Nerves: JJ:HERD hemianopsia. III,IV, VI: ptosis not present, extra-ocular motions intact bilaterally, pupils equal, round, reactive to light and  accommodation V,VII: smile asymmetric left facial droop, facial light touch sensation normal bilaterally VIII: hearing normal bilaterally IX,X: uvula rises midline XI: bilateral shoulder shrug XII: midline tongue extension Motor: Right : Upper extremity   5/5 Left:     Upper extremity   1/5  Lower extremity   5/5   Lower extremity   1/5 Tone and bulk: Increased tone all over the left side Sensory:  light touch intact throughout, bilaterally Plantars: Right: chronically upgoing Left: chronically upgoing Cerebella Normal on the right Gait: deferred   Lab Results: Basic Metabolic Panel: Recent Labs  Lab 04/03/19 0953 04/03/19 1133 04/04/19 0501 04/09/19 0401  NA 137  --  139 135  K 4.2  --  3.9 3.6  CL 101  --  102 101  CO2 23  --  27 25  GLUCOSE 150*  --  123* 122*  BUN 19  --  17 22  CREATININE 1.36*  --  1.19 1.38*  CALCIUM 6.1* 6.1* 7.0* 7.3*  MG  --  1.9  --   --   PHOS  --  3.6  --   --     CBC: Recent Labs  Lab 04/03/19 0953 04/04/19 0501 04/09/19 0401  WBC 10.5 9.8 10.5  NEUTROABS 7.3  --  6.7  HGB 14.7 13.9 14.5  HCT 45.1 42.8 43.4  MCV 95.1 94.7 92.7  PLT 242 251 245    Lipid Panel: Recent Labs  Lab 04/04/19 0500  CHOL 129  TRIG 79  HDL 36*  CHOLHDL 3.6  VLDL 16  LDLCALC 77    CBG: Recent Labs  Lab 04/03/19 0947 04/09/19 1530  GLUCAP 143* 104*    Imaging: CT Head Wo Contrast  Result Date: 04/09/2019 CLINICAL DATA:  Altered mental status with left-sided weakness over 24 hours. EXAM: CT HEAD WITHOUT CONTRAST TECHNIQUE: Contiguous axial images were obtained from the base of the skull through the vertex without intravenous contrast. COMPARISON:  CT head 04/03/2019 FINDINGS: Brain: New area of hypodensity in the right occipital lobe compatible with acute to subacute infarct. This was not seen previously. No associated hemorrhage. Mild atrophy. Mild chronic microvascular ischemic changes in the white matter. Vascular: Negative for hyperdense  vessel Skull: The negative Sinuses/Orbits: Paranasal sinuses clear.  Bilateral ocular surgery. Other: None IMPRESSION: New area of hypodensity right occipital lobe compatible with acute/subacute PCA infarct. No associated hemorrhage Mild atrophy and mild chronic microvascular ischemia. These results were called by telephone at the time of interpretation on 04/09/2019 at 9:12 am to provider Sabra Heck, who verbally acknowledged these results. Electronically Signed   By: Franchot Gallo M.D.   On: 04/09/2019 09:13   DG Chest Portable 1 View  Result Date: 04/09/2019 CLINICAL DATA:  NG tube placement. EXAM: PORTABLE CHEST 1 VIEW COMPARISON:  April 09, 2019 FINDINGS: The heart size is mildly enlarged. The enteric tube tip projects over the gastric body. There is no pneumothorax. No large pleural effusion. No evidence for focal infiltrate. IMPRESSION: No significant interval change in positioning of the NG tube. Consider further advancement with repeat radiograph to follow. Electronically Signed   By: Constance Holster M.D.   On: 04/09/2019 15:34   DG Chest Portable 1 View  Result Date: 04/09/2019 CLINICAL DATA:  Nasogastric tube placement EXAM: PORTABLE CHEST 1 VIEW COMPARISON:  April 03, 2019 FINDINGS: Feeding tube is identified with distal tip probably at the GE junction. Advancement is recommended. The mediastinal contour and cardiac silhouette are stable. The lungs are clear. IMPRESSION: Feeding tube is identified with distal tip probably at the GE junction. Advancement is recommended. Electronically Signed   By: Abelardo Diesel M.D.   On: 04/09/2019 13:44       Laurey Morale, MSN, NP-C [facility] [phone]  04/09/2019, 4:39 PM   Attending physician note to follow with Assessment and plan .   Assessment: 84 y.o. male with   With PMH HTN, HLD who presented to [facility] for increased left side weakness, speech problems and dysphagia. transferred to [facility]. [place] revealed a right occipital lobe  hypodensity.MRI shows a right PCA infarct.. Initial NIHSS: 10. TPA was not offered d/t patient presenting outside of the window. Full stroke work up recommended.  Stroke Risk Factors - hyperlipidemia and hypertension   Recommendations: -- BP goal : normotensive --MRI Brain  --CTA head and neck --Echocardiogram -- Prophylactic therapy-Antiplatelet med asa 325 -- High intensity Statin if LDL > 70 -- HgbA1c, fasting lipid panel -- PT consult, OT consult, Speech consult --Telemetry monitoring --Frequent neuro checks --NPO until passes  swallow screen --continue NGT for medications  Discussed my plan in detail with the daughter and granddaughter at bedside.  --please page stroke NP  Or  PA  Or MD from 8am -4 pm  as this patient from this time will be  followed by the stroke.   You can look them up on [email or web address]  [place]  Attending Neurohospitalist Addendum Patient seen and examined with APP/Resident. Agree with the history and physical as documented above. Agree with the plan as documented, which I helped formulate. I have independently reviewed the chart, obtained history, review of systems and examined the patient.I have personally reviewed pertinent head/neck/spine imaging (CT/MRI).  MRI with right PCA stroke involving thalamus. Please feel free to call with any questions. --- Amie Portland, MD [facility] Pager: [phone]  If 7pm to 7am, please call on call as listed on AMION.

## 2019-04-09 NOTE — ED Notes (Signed)
Called [facility] with bed assignment and for transport to [facility]. 

## 2019-04-09 NOTE — ED Notes (Signed)
Report to [employer]

## 2019-04-09 NOTE — H&P (Addendum)
Patient Demographics:    Curtis Jenkins, is a 84 y.o. 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 Active Problems:   Hypocalcemia   HTN (hypertension)   Dysphagia following cerebrovascular accident   Tobacco abuse-Chews Tobacco   HLD (hyperlipidemia)   Speech disturbance due to Acute CVA  1)Right Occipital Lobe Acute/Subacute Stroke with left-sided hemiplegia as well as speech and swallowing disturbance-- - place on telemetry monitored unit, treat empirically with aspirin and  atorvastatin Rx, MRI of the brain pending, echocardiogram to rule out intracardiac thrombus  and to evaluate EF is pending, defer to in house neurohospitalist the decision on choice of CTA  Versus MRA Head/Neck to rule out or large Vessel occlusion or hemodynamically significant stenosis. Neurology consult pending.  PT/OT eval pending, speech eval pending, We will allow some permissive hypertension in view of  acute stroke, avoid precipitous drop in blood pressure. Use  Labetalol 10 mg iv every 4 hrs as needed for systolic blood pressure over 210 mmhg ,  -Hold amlodipine - #Please maintain euthermia. Tylenol prn for temp >100.4 # Frequent neuro checks # NPO until passes stroke swallow screen --On 04/03/2019 TSH was 3.0, A1c 6.3, LDL 77, HDL 36 -EDP discussed case with in-house neuro hospitalist at [facility] --Official neurohospitalist consult to be obtained when patient arrived at [facility]  2) hypocalcemia--- continue to replace calcium and vitamin D, recheck renal panel in a.m.  3)Dysphagia--- due to acute stroke, however patient's wife states that swallowing  difficulties have been present for a while and just got worse recently in the setting of acute stroke --Speech and swallow eval pending --Okay to give Ensure supplements and medications via NG tube  4)Social/Ethics--- discussed with patient's daughter [name] and patient's wife at bedside, patient is a full code  5) nicotine dependence----patient chews tobacco daily, after Tobacco strongly advised especially given stroke finding  With History of - Reviewed by me  Past Medical History:  Diagnosis Date  . Arthritis   . Hyperlipidemia   . Hypertension        Chief Complaint  Patient presents with  . Weakness      HPI:    Curtis Jenkins  is a 84 y.o. male  with a past medical history significant for HTN, HLD, and DJD as well as insomnia who presents to the ED on 04/09/2019 with worsening left-sided weakness, speech problems and worsening swallowing difficulties  --No falls, no chest pains no palpitations no dizziness -Additional hx obtained from family---discussed with the patient and daughter [name] at bedside and pt's wife by phone  -Patient was admitted on 04/03/2019 with unsteady gait, weakness, heavy/numbness in his feet and hands and facial paresthesia--- he was found to have hypocalcemia (had tingling, numbness  and paresthesia symptoms affecting both feet hands face and perioral area.) --His perioral numbness/tingling improved after placement of calcium at that time -His CT head was nonacute on 04/03/2019  -He was discharged on 04/04/2019 significantly improved after replacement of calcium -- He returns today with worsening neuro symptoms as outlined above--- with CT head showing acute/subacute right occipital lobe stroke-  In ED----chest x-ray without acute findings -Creatinine is 1.38 up from 1.2 about 5 days ago -UDS is negative and troponin is flat -EKG is sinus with QTC of 470 -LFTs WNL  --EDP discussed case with in-house neuro hospitalist at [facility] --Official  neurohospitalist consult to be obtained when patient arrived at [place] of systems:    In addition to the HPI above,   A full Review of  Systems was done, all other systems reviewed are negative except as noted above in HPI , .    Social History:  Reviewed by me   Social History   Tobacco Use  . Smoking status: Never Smoker  . Smokeless tobacco: Current User    Types: Chew  Substance Use Topics  . Alcohol use: Never     Family History :  Reviewed by me  HTN   Home Medications:   Prior to Admission medications   Medication Sig Start Date End Date Taking? Authorizing Provider  amLODipine (NORVASC) 5 MG tablet Take 5 mg by mouth daily. 01/11/19  Yes [provider]  calcium-vitamin D (OSCAL WITH D) 500-200 MG-UNIT tablet Take 1 tablet by mouth 2 (two) times daily. 04/04/19  Yes Barton Dubois, MD  traMADol (ULTRAM) 50 MG tablet Take 50 mg by mouth 3 (three) times daily as needed. 02/21/19  Yes [provider]  atorvastatin (LIPITOR) 20 MG tablet Take 20 mg by mouth at bedtime. 01/25/19   [provider]  Vitamin D, Ergocalciferol, (DRISDOL) 1.25 MG (50000 UNIT) CAPS capsule Take 1 capsule (50,000 Units total) by mouth every 7 (seven) days. 04/11/19   Barton Dubois, MD  zolpidem (AMBIEN) 10 MG tablet Take 10 mg by mouth at bedtime as needed. 03/28/19   [provider]     Allergies:    No Known Allergies   Physical Exam:   Vitals  Blood pressure 101/88, pulse 76, temperature 98.6 F (37 C), temperature source Oral, resp. rate 17, height 6' (1.829 m), weight 108.9 kg, SpO2 98 %.  Physical Examination: General appearance - alert, well appearing, and in no distress  Mental status - alert, oriented to person, place, and time,  Eyes - sclera anicteric Neck - supple, no JVD elevation , Chest - clear  to auscultation bilaterally, symmetrical air movement,  Heart - S1 and S2 normal, regular  Abdomen - soft, nontender,  nondistended, no masses or organomegaly Neurological - screening mental status exam normal, neck supple without rigidity, cranial nerves II through XII intact, DTR's normal and symmetric Extremities - no pedal edema noted, intact peripheral pulses Skin - warm, dry Neuro---- Neurologial Exam: Mental Status: Patient is less alert than usual, oriented to person, place, month, year, and situation. Patient is able to give a clear and coherent history.  No signs of  Neglect (garbled speech/speech disturbance noted) Cranial Nerves: II: Visual Fields are full. Pupils are equal, round, and reactive to light.   III,IV, VI: EOMI without ptosis or diploplia.  V: Facial sensation is diminished on the left VII: Facial movement is asymmetric (lt sided weakness)  VIII: hearing is intact to voice  X: Uvula elevates symmetrically XI: Shoulder shrug is symmetric. XII: tongue is midline without atrophy or fasciculations.  Motor: Tone is normal. Bulk is normal. 5/5 strength was present on the Right --- Patient with left-sided hemiparesis (LUE and LLE) with mild sensory deficits Sensory: Sensation -on the Lt with mild sensory deficits to pinprick and to touch Cerebellar: FNF without ataxia --I did not ambulate the patient due to significant left-sided weakness/hemiplegia    Data Review:    CBC Recent Labs  Lab 04/03/19 0953 04/04/19 0501 04/09/19 0401  WBC 10.5 9.8 10.5  HGB 14.7 13.9 14.5  HCT 45.1 42.8 43.4  PLT 242 251 245  MCV 95.1 94.7 92.7  MCH 31.0 30.8 31.0  MCHC 32.6 32.5 33.4  RDW 12.6 12.8 12.4  LYMPHSABS 1.6  --  2.1  MONOABS 1.2*  --  1.3*  EOSABS 0.2  --  0.3  BASOSABS 0.1  --  0.1   Chemistries  Recent Labs  Lab 04/03/19 0953 04/03/19 1133 04/04/19 0501 04/09/19 0401  NA 137  --  139 135  K 4.2  --  3.9 3.6  CL 101  --  102 101  CO2 23  --  27 25  GLUCOSE 150*  --  123* 122*  BUN 19  --  17 22  CREATININE 1.36*  --  1.19 1.38*  CALCIUM 6.1* 6.1* 7.0* 7.3*  MG   --  1.9  --   --   AST 17  --   --  24  ALT 12  --   --  18  ALKPHOS 99  --   --  99  BILITOT 1.1  --   --  1.2   ------------------------------------------------------------------------------------------------------------------ estimated creatinine clearance is 52.6 mL/min (A) (by C-G formula based on SCr of 1.38 mg/dL (H)). ------------------------------------------------------------------------------------------------------------------ No results for input(s): TSH, T4TOTAL, T3FREE, THYROIDAB in the last 72 hours.  Invalid input(s): FREET3   Coagulation profile Recent Labs  Lab 04/09/19 0401  INR 1.2   ------------------------------------------------------------------------------------------------------------------- No results for input(s): DDIMER in the last 72 hours. -------------------------------------------------------------------------------------------------------------------  Cardiac Enzymes No results for input(s): CKMB, TROPONINI, MYOGLOBIN in the last 168 hours.  Invalid input(s): CK ------------------------------------------------------------------------------------------------------------------ No results found for: BNP  Urinalysis    Component Value Date/Time   COLORURINE YELLOW 04/09/2019 0404   APPEARANCEUR CLEAR 04/09/2019 0404   LABSPEC 1.016 04/09/2019 0404   PHURINE 7.0 04/09/2019 0404   GLUCOSEU NEGATIVE 04/09/2019 0404   HGBUR NEGATIVE 04/09/2019 0404   BILIRUBINUR NEGATIVE 04/09/2019 0404   KETONESUR NEGATIVE 04/09/2019 0404   PROTEINUR 100 (A) 04/09/2019 0404   UROBILINOGEN 0.2 04/11/2010 1329   NITRITE NEGATIVE 04/09/2019 0404   LEUKOCYTESUR NEGATIVE 04/09/2019 0404    ----------------------------------------------------------------------------------------------------------------   Imaging Results:    CT Head Wo Contrast  Result Date: 04/09/2019 CLINICAL DATA:  Altered mental status with left-sided weakness over 24 hours. EXAM: CT HEAD  WITHOUT CONTRAST TECHNIQUE: Contiguous axial images were obtained from the base of the skull through the vertex without intravenous contrast. COMPARISON:  CT head 04/03/2019 FINDINGS: Brain: New area of hypodensity in the right occipital lobe compatible with acute to subacute infarct. This was not seen previously. No associated hemorrhage. Mild atrophy. Mild chronic microvascular ischemic changes in the white matter. Vascular: Negative for hyperdense vessel Skull: The negative Sinuses/Orbits: Paranasal sinuses clear.  Bilateral ocular surgery. Other: None IMPRESSION: New area of hypodensity right occipital lobe compatible with acute/subacute PCA infarct. No associated hemorrhage Mild atrophy and mild chronic microvascular ischemia. These  results were called by telephone at the time of interpretation on 04/09/2019 at 9:12 am to provider Sabra Heck, who verbally acknowledged these results. Electronically Signed   By: Franchot Gallo M.D.   On: 04/09/2019 09:13   DG Chest Portable 1 View  Result Date: 04/09/2019 CLINICAL DATA:  NG tube placement. EXAM: PORTABLE CHEST 1 VIEW COMPARISON:  April 09, 2019 FINDINGS: The heart size is mildly enlarged. The enteric tube tip projects over the gastric body. There is no pneumothorax. No large pleural effusion. No evidence for focal infiltrate. IMPRESSION: No significant interval change in positioning of the NG tube. Consider further advancement with repeat radiograph to follow. Electronically Signed   By: Constance Holster M.D.   On: 04/09/2019 15:34   DG Chest Portable 1 View  Result Date: 04/09/2019 CLINICAL DATA:  Nasogastric tube placement EXAM: PORTABLE CHEST 1 VIEW COMPARISON:  April 03, 2019 FINDINGS: Feeding tube is identified with distal tip probably at the GE junction. Advancement is recommended. The mediastinal contour and cardiac silhouette are stable. The lungs are clear. IMPRESSION: Feeding tube is identified with distal tip probably at the GE junction.  Advancement is recommended. Electronically Signed   By: Abelardo Diesel M.D.   On: 04/09/2019 13:44    Radiological Exams on Admission: CT Head Wo Contrast  Result Date: 04/09/2019 CLINICAL DATA:  Altered mental status with left-sided weakness over 24 hours. EXAM: CT HEAD WITHOUT CONTRAST TECHNIQUE: Contiguous axial images were obtained from the base of the skull through the vertex without intravenous contrast. COMPARISON:  CT head 04/03/2019 FINDINGS: Brain: New area of hypodensity in the right occipital lobe compatible with acute to subacute infarct. This was not seen previously. No associated hemorrhage. Mild atrophy. Mild chronic microvascular ischemic changes in the white matter. Vascular: Negative for hyperdense vessel Skull: The negative Sinuses/Orbits: Paranasal sinuses clear.  Bilateral ocular surgery. Other: None IMPRESSION: New area of hypodensity right occipital lobe compatible with acute/subacute PCA infarct. No associated hemorrhage Mild atrophy and mild chronic microvascular ischemia. These results were called by telephone at the time of interpretation on 04/09/2019 at 9:12 am to provider Sabra Heck, who verbally acknowledged these results. Electronically Signed   By: Franchot Gallo M.D.   On: 04/09/2019 09:13   DG Chest Portable 1 View  Result Date: 04/09/2019 CLINICAL DATA:  NG tube placement. EXAM: PORTABLE CHEST 1 VIEW COMPARISON:  April 09, 2019 FINDINGS: The heart size is mildly enlarged. The enteric tube tip projects over the gastric body. There is no pneumothorax. No large pleural effusion. No evidence for focal infiltrate. IMPRESSION: No significant interval change in positioning of the NG tube. Consider further advancement with repeat radiograph to follow. Electronically Signed   By: Constance Holster M.D.   On: 04/09/2019 15:34   DG Chest Portable 1 View  Result Date: 04/09/2019 CLINICAL DATA:  Nasogastric tube placement EXAM: PORTABLE CHEST 1 VIEW COMPARISON:  April 03, 2019  FINDINGS: Feeding tube is identified with distal tip probably at the GE junction. Advancement is recommended. The mediastinal contour and cardiac silhouette are stable. The lungs are clear. IMPRESSION: Feeding tube is identified with distal tip probably at the GE junction. Advancement is recommended. Electronically Signed   By: Abelardo Diesel M.D.   On: 04/09/2019 13:44    DVT Prophylaxis -SCD /Heparin AM Labs Ordered, also please review Full Orders  Family Communication: Admission, patients condition and plan of care including tests being ordered have been discussed with the patient and daughter [name] at bedside and pt's wife  by phone who indicate understanding and agree with the plan   Code Status - Full Code  Likely----anticipate likely DC  to SNF rehab  Condition stable  Roxan Hockey M.D on 04/09/2019 at 3:44 PM Go to [email or web address] -  for contact info  [facility] - Office  [phone]

## 2019-04-09 NOTE — ED Notes (Signed)
Patient given aspirin and 1/2 of Lipitor. Patient did not tolerate well. 2nd half of Lipitor held. Dr Joesph Fillers made aware. Patient to remain NPO.

## 2019-04-09 NOTE — ED Triage Notes (Signed)
Patient brought in by [facility]. Patient has had facial droop and left sided weakness for 24 plus hours according to family on scene. Patient was seen here about 1 week ago and diagnosed with calcium issues. Patient had COVID vaccine x 2 weeks ago.

## 2019-04-09 NOTE — ED Notes (Signed)
Pt and spouse given room number and informed [facility] is enroute   Pt appears to understand all information and speaks slowly Appears to have a bit of expressive aphasia versus speech impairment from deficit

## 2019-04-09 NOTE — ED Provider Notes (Signed)
Pt has been admitted to Dr. Denton Brick - for stroke w/u   Noemi Chapel, MD 04/09/19 [phone]

## 2019-04-09 NOTE — ED Notes (Signed)
Pt transported to CT Scan

## 2019-04-09 NOTE — ED Notes (Signed)
Awaiting bed assignment and transfere

## 2019-04-10 ENCOUNTER — Inpatient Hospital Stay (HOSPITAL_COMMUNITY): Payer: Medicare Other

## 2019-04-10 DIAGNOSIS — E785 Hyperlipidemia, unspecified: Secondary | ICD-10-CM

## 2019-04-10 DIAGNOSIS — I6389 Other cerebral infarction: Secondary | ICD-10-CM

## 2019-04-10 LAB — ECHOCARDIOGRAM COMPLETE
Height: 72 in
Weight: 3840 oz

## 2019-04-10 LAB — CBC
HCT: 45.8 % (ref 39.0–52.0)
Hemoglobin: 15.4 g/dL (ref 13.0–17.0)
MCH: 31.1 pg (ref 26.0–34.0)
MCHC: 33.6 g/dL (ref 30.0–36.0)
MCV: 92.5 fL (ref 80.0–100.0)
Platelets: 246 10*3/uL (ref 150–400)
RBC: 4.95 MIL/uL (ref 4.22–5.81)
RDW: 12.3 % (ref 11.5–15.5)
WBC: 12.6 10*3/uL — ABNORMAL HIGH (ref 4.0–10.5)
nRBC: 0 % (ref 0.0–0.2)

## 2019-04-10 LAB — RENAL FUNCTION PANEL
Albumin: 3.6 g/dL (ref 3.5–5.0)
Anion gap: 15 (ref 5–15)
BUN: 21 mg/dL (ref 8–23)
CO2: 22 mmol/L (ref 22–32)
Calcium: 7.5 mg/dL — ABNORMAL LOW (ref 8.9–10.3)
Chloride: 103 mmol/L (ref 98–111)
Creatinine, Ser: 1.35 mg/dL — ABNORMAL HIGH (ref 0.61–1.24)
GFR calc Af Amer: 56 mL/min — ABNORMAL LOW (ref >60)
GFR calc non Af Amer: 49 mL/min — ABNORMAL LOW (ref >60)
Glucose, Bld: 121 mg/dL — ABNORMAL HIGH (ref 70–99)
Phosphorus: 3.3 mg/dL (ref 2.5–4.6)
Potassium: 3.6 mmol/L (ref 3.5–5.1)
Sodium: 140 mmol/L (ref 135–145)

## 2019-04-10 MED ORDER — RESOURCE THICKENUP CLEAR PO POWD
ORAL | Status: DC | PRN
  Filled 2019-04-10: qty 125

## 2019-04-10 MED ORDER — MORPHINE SULFATE (PF) 2 MG/ML IV SOLN
2.0000 mg | Freq: Once | INTRAVENOUS | Status: AC
  Administered 2019-04-10: 2 mg via INTRAVENOUS
  Filled 2019-04-10: qty 1

## 2019-04-10 NOTE — Progress Notes (Signed)
PROGRESS NOTE    Curtis Jenkins  MOL:[id number] DOB: August 12, 1935 DOA: 04/09/2019 PCP: Redmond School, MD  Brief Narrative:  84 year old gentleman with prior history of hypertension, hyperlipidemia presented to ED at [facility] for left-sided PE weakness, dysphagia and speech abnormalities going on for about a week.  He was transferred to [facility] for further evaluation of stroke and MRI of the brain.  Neurology was consulted.   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 No associated hemorrhage.  MRI of the brain without contrast shows Large acute to early subacute right PCA infarct. Moderate chronic small vessel ischemic disease.  CT angiogram of the head and neck showed Proximal right PCA occlusion.  Widely patent carotid and vertebral arteries.  LDL, hemoglobin A1c is pending. Therapy evaluations are pending Neurology on board, recommending echocardiogram and to start the patient on aspirin 300 mg per rectal/325 mg orally.    Essential hypertension Blood pressure parameters suboptimally controlled, Permissive hypertension up to 220/120 mmHg  Stage II CKD Creatinine appears to be at baseline at 1.3.   Hyperlipidemia  Patient is currently on Lipitor 80 mg daily.  Dysphagia and speech abnormality SLP eval/cognitive language evaluation ordered.   DVT prophylaxis: (Heparin Code Status: Full code  family Communication: None at bedside Disposition Plan:  . Patient came from: Home           . Anticipated d/c place: CIR . Barriers to d/c OR  conditions which need to be met to effect a safe d/c: CIR evaluation pending   Consultants:   Neurology  Procedures: MRI of the brain  Antimicrobials: None   Subjective: Patient denies any chest pain or shortness of breath, no nausea or vomiting.  He denies any headache or dizziness.  Patient thinks that he is in his house  Objective: Vitals:   04/09/19 2030 04/09/19 2339 04/10/19 0300 04/10/19 0810  BP: (!) 158/97 139/87 (!) 141/87 (!) 148/83  Pulse: 78 (!) 42 79 67  Resp: _0 Temp: 98.3 F (36.8 C) 98.3 F (36.8 C) 98.6 F (37 C) 99 F (37.2 C)  TempSrc: Oral Oral Oral Oral  SpO2: 95% 98% 96% 99%  Weight:      Height:        Intake/Output Summary (Last 24 hours) at 04/10/2019 1115 Last data filed at 04/10/2019 1000 Gross per 24 hour  Intake 225.05 ml  Output 500 ml  Net -274.95 ml   Filed Weights   04/09/19 0330  Weight: 108.9 kg    Examination:  General exam: Appears calm and comfortable  Respiratory system: Clear to auscultation. Respiratory effort normal. Cardiovascular system: S1 & S2 heard, RRR. No JVD,No pedal edema. Gastrointestinal system: Abdomen is nondistended, soft and nontender. Normal bowel sounds heard. Central nervous system: Alert and confused Extremities: No pedal edema, cyanosis Skin: No rashes, lesions or ulcers Psychiatry:  Mood is appropriate   Data Reviewed: I have personally reviewed following labs and imaging studies  CBC: Recent Labs  Lab 04/04/19 0501 04/09/19 0401 04/10/19 0320  WBC 9.8 10.5 12.6*  NEUTROABS  --  6.7  --   HGB 13.9 14.5 15.4  HCT 42.8 43.4 45.8  MCV 94.7 92.7 92.5  PLT 251 245 233   Basic Metabolic Panel: Recent Labs  Lab 04/03/19 1133 04/04/19 0501 04/09/19 0401 04/10/19 0320  NA  --  139 135 140  K  --  3.9 3.6 3.6  CL  --  102 101 103  CO2  --  _0 GLUCOSE  --  123* 122* 121*  BUN  --  _1 CREATININE  --  1.19 1.38* 1.35*  CALCIUM 6.1* 7.0* 7.3* 7.5*  MG 1.9  --    --   --   PHOS 3.6  --   --  3.3   GFR: Estimated Creatinine Clearance: 53.8 mL/min (A) (by C-G formula based on SCr of 1.35 mg/dL (H)). Liver Function Tests: Recent Labs  Lab 04/09/19 0401 04/10/19 0320  AST 24  --   ALT 18  --   ALKPHOS 99  --   BILITOT 1.2  --   PROT 7.3  --   ALBUMIN 4.0 3.6   Recent Labs  Lab 04/09/19 0401  LIPASE 25   No results for input(s): AMMONIA in the last 168 hours. Coagulation Profile: Recent Labs  Lab 04/09/19 0401  INR 1.2   Cardiac Enzymes: No results for input(s): CKTOTAL, CKMB, CKMBINDEX, TROPONINI in the last 168 hours. BNP (last 3 results) No results for input(s): PROBNP in the last 8760 hours. HbA1C: No results for input(s): HGBA1C in the last 72 hours. CBG: Recent Labs  Lab 04/09/19 1530  GLUCAP 104*   Lipid Profile: No results for input(s): CHOL, HDL, LDLCALC, TRIG, CHOLHDL, LDLDIRECT in the last 72 hours. Thyroid Function Tests: No results for input(s): TSH, T4TOTAL, FREET4, T3FREE, THYROIDAB in the last 72 hours. Anemia Panel: No results for input(s): VITAMINB12, FOLATE, FERRITIN, TIBC, IRON, RETICCTPCT in the last 72 hours. Sepsis Labs: No results for input(s): PROCALCITON, LATICACIDVEN in the last 168 hours.  Recent Results (from the past 240 hour(s))  SARS CORONAVIRUS 2 (TAT 6-24 HRS) Nasopharyngeal Nasopharyngeal Swab     Status: None   Collection Time: 04/03/19 11:42 AM   Specimen: Nasopharyngeal Swab  Result Value Ref Range Status   SARS Coronavirus 2 NEGATIVE NEGATIVE Final    Comment: (NOTE) SARS-CoV-2 target nucleic acids are NOT DETECTED. The SARS-CoV-2 RNA is generally detectable in upper and lower respiratory specimens during the acute phase of infection. Negative results do not preclude SARS-CoV-2 infection, do not rule out co-infections with other pathogens, and should not be used as the sole basis for treatment or other patient management decisions. Negative results must be combined with clinical  observations, patient history, and epidemiological information. The expected result is Negative. Fact Sheet for Patients: SugarRoll.be Fact Sheet for Healthcare Providers: https://www.woods-mathews.com/ This test is not yet approved or cleared by the [place] FDA and  has been authorized for detection and/or diagnosis of SARS-CoV-2 by FDA under an Emergency Use Authorization (EUA). This EUA will remain  in effect (meaning this test can be used) for the duration of the COVID-19 declaration under Section 56 4(b)(1) of the Act, 21 U.S.C. section 360bbb-3(b)(1), unless the authorization is terminated or revoked sooner. Performed at [facility], [place] [address] [place], [place] [address]   SARS CORONAVIRUS 2 (TAT 6-24 HRS) Nasopharyngeal Nasopharyngeal Swab     Status: None   Collection Time: 04/09/19  7:25 AM   Specimen: Nasopharyngeal Swab  Result Value  Ref Range Status   SARS Coronavirus 2 NEGATIVE NEGATIVE Final    Comment: (NOTE) SARS-CoV-2 target nucleic acids are NOT DETECTED. The SARS-CoV-2 RNA is generally detectable in upper and lower respiratory specimens during the acute phase of infection. Negative results do not preclude SARS-CoV-2 infection, do not rule out co-infections with other pathogens, and should not be used as the sole basis for treatment or other patient management decisions. Negative results must be combined with clinical observations, patient history, and epidemiological information. The expected result is Negative. Fact Sheet for Patients: SugarRoll.be Fact Sheet for Healthcare Providers: https://www.woods-mathews.com/ This test is not yet approved or cleared by the [place] FDA and  has been authorized for detection and/or diagnosis of SARS-CoV-2 by FDA under an Emergency Use Authorization (EUA). This EUA will remain  in effect (meaning this test can be used)  for the duration of the COVID-19 declaration under Section 56 4(b)(1) of the Act, 21 U.S.C. section 360bbb-3(b)(1), unless the authorization is terminated or revoked sooner. Performed at [facility], [place] [address], [place], [place] [address]          Radiology Studies: CT ANGIO HEAD W OR WO CONTRAST  Result Date: 04/09/2019 CLINICAL DATA:  Stroke follow-up. Large right PCA infarct. EXAM: CT ANGIOGRAPHY HEAD AND NECK TECHNIQUE: Multidetector CT imaging of the head and neck was performed using the standard protocol during bolus administration of intravenous contrast. Multiplanar CT image reconstructions and MIPs were obtained to evaluate the vascular anatomy. Carotid stenosis measurements (when applicable) are obtained utilizing NASCET criteria, using the distal internal carotid diameter as the denominator. CONTRAST:  26m OMNIPAQUE IOHEXOL 350 MG/ML SOLN COMPARISON:  None. FINDINGS: CTA NECK FINDINGS Aortic arch: Standard 3 vessel aortic arch with mild atherosclerotic plaque. No arch vessel origin stenosis. Right carotid system: Patent with small volume soft plaque at the carotid bifurcation. No evidence of stenosis or dissection. Left carotid system: Patent with small volume soft plaque at the common carotid artery origin and carotid bifurcation. No evidence of stenosis or dissection. Tortuous mid cervical ICA. Vertebral arteries: Patent and codominant without evidence of significant stenosis or dissection. Skeleton: Advanced C1-2 arthropathy with multiple erosions in the dens. Advanced spondylosis with bulky anterior vertebral ossification extending from C2 into the included thoracic spine. Severe facet arthrosis at C7-T1 with grade 1 anterolisthesis. Severe multilevel neural foraminal stenosis. Other neck: No evidence of cervical lymphadenopathy or mass. Nasogastric tube forms a loop in the oropharynx. Upper chest: Clear lung apices. Review of the MIP images confirms the above findings CTA  HEAD FINDINGS Anterior circulation: The internal carotid arteries are patent from skull base to carotid termini with mild nonstenotic plaque bilaterally. ACAs and MCAs are patent without evidence of proximal branch occlusion or significant proximal stenosis. The left ACA is dominant. No aneurysm is identified. Posterior circulation: The intracranial vertebral arteries are patent to the basilar with atherosclerotic irregularity bilaterally but no significant stenosis. Patent left PICA, right AICA, and bilateral SCAs are visualized. The basilar artery is widely patent. There is occlusion of the right PCA at the P1 level. The left PCA is patent. No aneurysm is identified. Venous sinuses: As permitted by contrast timing, patent. Anatomic variants: None of significance. Review of the MIP images confirms the above findings IMPRESSION: 1. Proximal right PCA occlusion. 2. Widely patent carotid and vertebral arteries. 3. Aortic Atherosclerosis (ICD10-I70.0). Electronically Signed   By: ALogan BoresM.D.   On: 04/09/2019 21:47   DG Abd 1 View  Result Date:  04/09/2019 CLINICAL DATA:  NG tube placement EXAM: ABDOMEN - 1 VIEW COMPARISON:  April 09, 2019 FINDINGS: The NG tube again projects over the left upper quadrant with the side hole near the GE junction. The bowel gas pattern is nonobstructive and nonspecific. There are degenerative changes of the lumbar spine. IMPRESSION: NG tube is unchanged in positioning. If possible, further advancement into the stomach is recommended. Electronically Signed   By: Constance Holster M.D.   On: 04/09/2019 19:11   CT Head Wo Contrast  Result Date: 04/09/2019 CLINICAL DATA:  Altered mental status with left-sided weakness over 24 hours. EXAM: CT HEAD WITHOUT CONTRAST TECHNIQUE: Contiguous axial images were obtained from the base of the skull through the vertex without intravenous contrast. COMPARISON:  CT head 04/03/2019 FINDINGS: Brain: New area of hypodensity in the right occipital  lobe compatible with acute to subacute infarct. This was not seen previously. No associated hemorrhage. Mild atrophy. Mild chronic microvascular ischemic changes in the white matter. Vascular: Negative for hyperdense vessel Skull: The negative Sinuses/Orbits: Paranasal sinuses clear.  Bilateral ocular surgery. Other: None IMPRESSION: New area of hypodensity right occipital lobe compatible with acute/subacute PCA infarct. No associated hemorrhage Mild atrophy and mild chronic microvascular ischemia. These results were called by telephone at the time of interpretation on 04/09/2019 at 9:12 am to provider Sabra Heck, who verbally acknowledged these results. Electronically Signed   By: Franchot Gallo M.D.   On: 04/09/2019 09:13   CT ANGIO NECK W OR WO CONTRAST  Result Date: 04/09/2019 CLINICAL DATA:  Stroke follow-up. Large right PCA infarct. EXAM: CT ANGIOGRAPHY HEAD AND NECK TECHNIQUE: Multidetector CT imaging of the head and neck was performed using the standard protocol during bolus administration of intravenous contrast. Multiplanar CT image reconstructions and MIPs were obtained to evaluate the vascular anatomy. Carotid stenosis measurements (when applicable) are obtained utilizing NASCET criteria, using the distal internal carotid diameter as the denominator. CONTRAST:  71m OMNIPAQUE IOHEXOL 350 MG/ML SOLN COMPARISON:  None. FINDINGS: CTA NECK FINDINGS Aortic arch: Standard 3 vessel aortic arch with mild atherosclerotic plaque. No arch vessel origin stenosis. Right carotid system: Patent with small volume soft plaque at the carotid bifurcation. No evidence of stenosis or dissection. Left carotid system: Patent with small volume soft plaque at the common carotid artery origin and carotid bifurcation. No evidence of stenosis or dissection. Tortuous mid cervical ICA. Vertebral arteries: Patent and codominant without evidence of significant stenosis or dissection. Skeleton: Advanced C1-2 arthropathy with multiple  erosions in the dens. Advanced spondylosis with bulky anterior vertebral ossification extending from C2 into the included thoracic spine. Severe facet arthrosis at C7-T1 with grade 1 anterolisthesis. Severe multilevel neural foraminal stenosis. Other neck: No evidence of cervical lymphadenopathy or mass. Nasogastric tube forms a loop in the oropharynx. Upper chest: Clear lung apices. Review of the MIP images confirms the above findings CTA HEAD FINDINGS Anterior circulation: The internal carotid arteries are patent from skull base to carotid termini with mild nonstenotic plaque bilaterally. ACAs and MCAs are patent without evidence of proximal branch occlusion or significant proximal stenosis. The left ACA is dominant. No aneurysm is identified. Posterior circulation: The intracranial vertebral arteries are patent to the basilar with atherosclerotic irregularity bilaterally but no significant stenosis. Patent left PICA, right AICA, and bilateral SCAs are visualized. The basilar artery is widely patent. There is occlusion of the right PCA at the P1 level. The left PCA is patent. No aneurysm is identified. Venous sinuses: As permitted by contrast timing, patent. Anatomic  variants: None of significance. Review of the MIP images confirms the above findings IMPRESSION: 1. Proximal right PCA occlusion. 2. Widely patent carotid and vertebral arteries. 3. Aortic Atherosclerosis (ICD10-I70.0). Electronically Signed   By: Logan Bores M.D.   On: 04/09/2019 21:47   MR BRAIN WO CONTRAST  Result Date: 04/09/2019 CLINICAL DATA:  Right occipital infarct. EXAM: MRI HEAD WITHOUT CONTRAST TECHNIQUE: Multiplanar, multiecho pulse sequences of the brain and surrounding structures were obtained without intravenous contrast. COMPARISON:  Head CT 04/09/2019 FINDINGS: Multiple sequences are up to moderately motion degraded. Brain: As seen on today's earlier CT, there is a large acute to early subacute right PCA infarct involving much of  the right occipital lobe and portions of the posteromedial right temporal lobe and thalamus. There is cytotoxic edema with regional sulcal effacement and mild mass effect on the right lateral ventricle. No midline shift, intracranial hemorrhage, or extra-axial fluid collection is identified. Patchy T2 hyperintensities elsewhere in the cerebral white matter bilaterally are nonspecific but compatible with moderate chronic small vessel ischemic disease. Mild cerebral atrophy is within normal limits for age. Vascular: Major intracranial vascular flow voids are preserved. Skull and upper cervical spine: No suspicious marrow lesion. Cervical spondylosis. Sinuses/Orbits: Right cataract extraction. Paranasal sinuses and mastoid air cells are clear. Other: None. IMPRESSION: 1. Large acute to early subacute right PCA infarct. 2. Moderate chronic small vessel ischemic disease. Electronically Signed   By: Logan Bores M.D.   On: 04/09/2019 17:50   DG Chest Portable 1 View  Result Date: 04/09/2019 CLINICAL DATA:  NG tube placement. EXAM: PORTABLE CHEST 1 VIEW COMPARISON:  April 09, 2019 FINDINGS: The heart size is mildly enlarged. The enteric tube tip projects over the gastric body. There is no pneumothorax. No large pleural effusion. No evidence for focal infiltrate. IMPRESSION: No significant interval change in positioning of the NG tube. Consider further advancement with repeat radiograph to follow. Electronically Signed   By: Constance Holster M.D.   On: 04/09/2019 15:34   DG Chest Portable 1 View  Result Date: 04/09/2019 CLINICAL DATA:  Nasogastric tube placement EXAM: PORTABLE CHEST 1 VIEW COMPARISON:  April 03, 2019 FINDINGS: Feeding tube is identified with distal tip probably at the GE junction. Advancement is recommended. The mediastinal contour and cardiac silhouette are stable. The lungs are clear. IMPRESSION: Feeding tube is identified with distal tip probably at the GE junction. Advancement is recommended.  Electronically Signed   By: Abelardo Diesel M.D.   On: 04/09/2019 13:44        Scheduled Meds: . aspirin  300 mg Rectal Daily  . atorvastatin  80 mg Oral QHS  . calcium-vitamin D  1 tablet Oral BID WC  . feeding supplement (ENSURE ENLIVE)  237 mL Oral TID PC  . heparin  5,000 Units Subcutaneous Q8H  . sodium chloride flush  3 mL Intravenous Q12H  . [START ON 04/11/2019] Vitamin D (Ergocalciferol)  50,000 Units Oral Q7 days   Continuous Infusions: . sodium chloride    . sodium chloride 75 mL/hr at 04/10/19 0250     LOS: 1 day        Hosie Poisson, MD [facility]   To contact the attending provider between 7A-7P or the covering provider during after hours 7P-7A, please log into the web site [email or web address] and access using universal [place] password for that web site. If you do not have the password, please call the hospital operator.  04/10/2019, 11:15 AM

## 2019-04-10 NOTE — Evaluation (Signed)
Speech Language Pathology Evaluation Patient Details Name: Curtis Jenkins MRN: [id number] DOB: 1935-07-21 Today's Date: 04/10/2019 Time: 0383-3383 SLP Time Calculation (min) (ACUTE ONLY): 9 min  Problem List:  Patient Active Problem List   Diagnosis Date Noted  . Rt Occipital Lobe Stroke with Lt hemiparesis 04/09/2019  . Dysphagia following cerebrovascular accident 04/09/2019  . Speech disturbance due to Acute CVA 04/09/2019  . Tobacco abuse-Chews Tobacco 04/09/2019  . Paresthesia   . Hypocalcemia 04/03/2019  . Prolonged QT interval 04/03/2019  . HTN (hypertension) 04/03/2019  . HLD (hyperlipidemia) 04/03/2019  . Weakness 04/03/2019   Past Medical History:  Past Medical History:  Diagnosis Date  . Arthritis   . Hyperlipidemia   . Hypertension    Past Surgical History: History reviewed. No pertinent surgical history. HPI:  Pt is an 84 yo male who presented to [facility] for worsening L sided weakness and speech/swallowing problems. He was transferred to [facility] for further management. MRI shows a large R PCA infarct. PMH includes: HTN, HLD, and per esophagram in 2002 [facility] and schiatzki's ring   Assessment / Plan / Recommendation Clinical Impression  Pt has a moderate dysarthria as well as significant cognitive deficits. He is inattentive to the left side of his body, acknowledging with prompting that he has weakness in his LUE only but attributing this to a prior shoulder injury and insisting that he is at his baseline. He cannot problem solve how to self-feed when he can't hold items in his L hand and even when his RLE had fallen off the chair he could not problem solve how to get it back in place. Pt can recall the name of his physical therapist from earlier today but thinks that I am her despite repeated reminders and he is not able to tell me anything they did together. His carryover of new information within this evaluation is also very limited despite Max cues and repetitions. He needs  frequent cues to sustain his attention to simple tasks.     SLP Assessment  SLP Recommendation/Assessment: Patient needs continued Speech Lanaguage Pathology Services SLP Visit Diagnosis: Dysarthria and anarthria (R47.1);Cognitive communication deficit (R41.841)    Follow Up Recommendations  Inpatient Rehab    Frequency and Duration min 2x/week  2 weeks      SLP Evaluation Cognition  Overall Cognitive Status: Impaired/Different from baseline Arousal/Alertness: Awake/alert Attention: Sustained Sustained Attention: Impaired Sustained Attention Impairment: Verbal basic;Functional basic Memory: Impaired Memory Impairment: Retrieval deficit;Decreased recall of new information;Decreased short term memory Decreased Short Term Memory: Verbal basic;Functional basic Awareness: Impaired Awareness Impairment: Intellectual impairment;Emergent impairment;Anticipatory impairment Problem Solving: Impaired Problem Solving Impairment: Functional basic;Verbal basic Behaviors: Impulsive Safety/Judgment: Impaired       Comprehension  Auditory Comprehension Overall Auditory Comprehension: Appears within functional limits for tasks assessed    Expression Expression Primary Mode of Expression: Verbal Verbal Expression Overall Verbal Expression: Appears within functional limits for tasks assessed Written Expression Dominant Hand: Right   Oral / Motor  Oral Motor/Sensory Function Overall Oral Motor/Sensory Function: Mild impairment(mild-mod) Facial ROM: Reduced left;Suspected CN VII (facial) dysfunction Facial Symmetry: Abnormal symmetry left;Suspected CN VII (facial) dysfunction Facial Strength: Reduced left;Suspected CN VII (facial) dysfunction Lingual ROM: Within Functional Limits Lingual Symmetry: Within Functional Limits Lingual Strength: Within Functional Limits Motor Speech Overall Motor Speech: Impaired Respiration: Within functional limits Phonation: Normal Resonance: Within  functional limits Articulation: Impaired Level of Impairment: Phrase Intelligibility: Intelligibility reduced Phrase: 75-100% accurate   GO  Osie Bond., M.A. [facility] Pager [phone] Office [phone]  04/10/2019, 12:42 PM

## 2019-04-10 NOTE — Evaluation (Signed)
Physical Therapy Evaluation Patient Details Name: Curtis Jenkins MRN: [id number] DOB: 1935/06/09 Today's Date: 04/10/2019   History of Present Illness  84 y.o. male with   With PMH HTN, HLD who presented to [facility] for increased left side weakness, speech problems and dysphagia. transferred to [facility]. Coleman revealed a right occipital lobe hypodensity.MRI shows a right PCA infarct.  Clinical Impression   Pt admitted with above diagnosis. Comes from home; noted per previous PT eval done 3/22, pt was a roughly Modified independent and Supervision functional levels; Mr. Dougher tells [place] he lives with his wife in a split level home, with no difficulty managing walking and ADLs; Presents to PT with L hemiparesis/weakness, impaired tone LLE, impaired balance, and overall decr functional mobility, requiring +2 Max assist for mobility and transfers;  Pt currently with functional limitations due to the deficits listed below (see PT Problem List). Pt will benefit from skilled PT to increase their independence and safety with mobility to allow discharge to the venue listed below.       Follow Up Recommendations CIR    Equipment Recommendations  Rolling walker with 5" wheels;3in1 (PT);Wheelchair (measurements PT);Wheelchair cushion (measurements PT);Other (comment)(to be determined at next venue of care)    Recommendations for Other Services       Precautions / Restrictions Precautions Precautions: Fall Precaution Comments: L hemiparesis Restrictions Weight Bearing Restrictions: No      Mobility  Bed Mobility Overal bed mobility: Needs Assistance Bed Mobility: Supine to Sit     Supine to sit: Max assist;+2 for physical assistance;+2 for safety/equipment     General bed mobility comments: Multimodal cueing to initiate; heavy mod assist to help LEs clear EOB; Max assist t oelevate trunk to sit and stabilize  Transfers Overall transfer level: Needs assistance Equipment used: 2 person hand held  assist(and gait belt/bed pad) Transfers: Sit to/from [employer] Transfers Sit to Stand: Max assist;+2 physical assistance Stand pivot transfers: Max assist;+2 physical assistance       General transfer comment: Multimodal cues to initiate with anterior weight shift; Max assist to stand with close guard/block of L knee  Ambulation/Gait                Stairs            Wheelchair Mobility    Modified Rankin (Stroke Patients Only) Modified Rankin (Stroke Patients Only) Pre-Morbid Rankin Score: No significant disability Modified Rankin: Severe disability     Balance Overall balance assessment: Needs assistance Sitting-balance support: Feet supported;No upper extremity supported Sitting balance-Leahy Scale: Poor     Standing balance support: During functional activity Standing balance-Leahy Scale: Zero Standing balance comment: Heavy L lean in standing                             Pertinent Vitals/Pain Pain Assessment: Faces Faces Pain Scale: Hurts even more Pain Location: Pain in bilateral ankles/feet; also decr sensation bil feet; particularly notable grimace and possible flexor withdrawal with attemtps at ROM LLE Pain Descriptors / Indicators: Aching;Grimacing;Guarding;Spasm Pain Intervention(s): Monitored during session;Repositioned    Home Living Family/patient expects to be discharged to:: Private residence Living Arrangements: Spouse/significant other Available Help at Discharge: Family;Available PRN/intermittently Type of Home: House       Home Layout: Multi-level;Able to live on main level with bedroom/bathroom Home Equipment: Walker - standard;Bedside commode;Grab bars - tub/shower      Prior Function Level of Independence: Independent  Comments: Pt ambulates without an assistive device at baseline     Hand Dominance   Dominant Hand: Right    Extremity/Trunk Assessment   Upper Extremity Assessment Upper  Extremity Assessment: Defer to OT evaluation    Lower Extremity Assessment Lower Extremity Assessment: LLE deficits/detail LLE Deficits / Details: Tends to hold LLE in hip external rotation and knee flexion; noting spasm-like pull into hip and knee flexion with attemtps at ROM into hip and knee extension; resembles involuntary movement related to flexor tone and spasm, but hard to discern if involunatry or if a voluntary response; the movement is similatr to flexor withdrawal; Able to get to near hip and knee extension with slower movement and the cue to "push"    Cervical / Trunk Assessment Cervical / Trunk Assessment: Kyphotic  Communication   Communication: Other (comment)(at times, difficult to understand)  Cognition Arousal/Alertness: Awake/alert Behavior During Therapy: WFL for tasks assessed/performed Overall Cognitive Status: Impaired/Different from baseline Area of Impairment: Orientation;Attention;Safety/judgement;Awareness;Problem solving;Following commands                 Orientation Level: Disoriented to;Time Current Attention Level: Sustained   Following Commands: Follows one step commands with increased time Safety/Judgement: Decreased awareness of safety;Decreased awareness of deficits   Problem Solving: Slow processing;Difficulty sequencing;Requires verbal cues;Requires tactile cues General Comments: Noting L Inattention/neglect; moves RLE when cued to move LLE      General Comments General comments (skin integrity, edema, etc.): VSS on Room Air    Exercises     Assessment/Plan    PT Assessment Patient needs continued PT services  PT Problem List Decreased strength;Decreased range of motion;Decreased activity tolerance;Decreased balance;Decreased mobility;Decreased coordination;Decreased cognition;Decreased knowledge of use of DME;Decreased safety awareness;Decreased knowledge of precautions;Impaired sensation;Impaired tone;Pain       PT Treatment  Interventions DME instruction;Gait training;Functional mobility training;Therapeutic activities;Therapeutic exercise;Balance training;Neuromuscular re-education;Cognitive remediation;Patient/family education;Wheelchair mobility training    PT Goals (Current goals can be found in the Care Plan section)  Acute Rehab PT Goals Patient Stated Goal: to drink some orange juice PT Goal Formulation: Patient unable to participate in goal setting Time For Goal Achievement: 04/24/19 Potential to Achieve Goals: Fair    Frequency Min 4X/week   Barriers to discharge        Co-evaluation               AM-PAC PT "6 Clicks" Mobility  Outcome Measure Help needed turning from your back to your side while in a flat bed without using bedrails?: A Lot Help needed moving from lying on your back to sitting on the side of a flat bed without using bedrails?: A Lot Help needed moving to and from a bed to a chair (including a wheelchair)?: A Lot Help needed standing up from a chair using your arms (e.g., wheelchair or bedside chair)?: A Lot Help needed to walk in hospital room?: Total Help needed climbing 3-5 steps with a railing? : Total 6 Click Score: 10    End of Session Equipment Utilized During Treatment: Gait belt Activity Tolerance: Patient tolerated treatment well Patient left: in chair;with call bell/phone within reach;with chair alarm set Nurse Communication: Mobility status PT Visit Diagnosis: Other abnormalities of gait and mobility (R26.89);Hemiplegia and hemiparesis Hemiplegia - Right/Left: Left Hemiplegia - dominant/non-dominant: Non-dominant Hemiplegia - caused by: Cerebral infarction    Time: 0923-1000 PT Time Calculation (min) (ACUTE ONLY): 37 min   Charges:   PT Evaluation $PT Eval Moderate Complexity: 1 Mod  Roney [place], [place]  Acute Rehabilitation Services Pager [phone] Office [phone]   Colletta [place] 04/10/2019, 10:31 AM

## 2019-04-10 NOTE — Progress Notes (Signed)
  Echocardiogram 2D Echocardiogram has been performed.  Curtis Jenkins 04/10/2019, 2:47 PM

## 2019-04-10 NOTE — Progress Notes (Signed)
STROKE TEAM PROGRESS NOTE   HISTORY OF PRESENT ILLNESS (per record) Curtis Jenkins is an 84 y.o. male  With PMH HTN, HLD who presented to [facility] for left side weakness, speech problems and dysphagia. transferred to [facility] Per patient he stated that he had some weakness and problems walking for about 1 week. Uses a walker to ambulate at baseline due to arthritis. Denies ETOH, smoking,drug use, vision changes. Does chew tobacco daily.  Endorses that he became weaker and that he had increased difficulty walking. Per chart review patient was seen 3/21 for unsteady gait, weakness, had tingling and numbness. CTH at that time showed no acute intracranial abnormalities. He was treated for hypocalcemia at that time and symptoms improved with administration of calcium.  Continued to have difficulty with left side of the body - with both sensory and motor system. Came in today to [facility].  CT head was done that showed a large right PCA territory infarct.  [facility] Hospital course:  CTH: hypodensity of right occipital lobe. Acute/subacute infarct. BP: 138/82 BG: 104 creatinine 1.34 Transferred to [facility] ED for neurology  Modified Rankin: 3 NIHSS:10   INTERVAL HISTORY Prior to hospitalization he had a fall at home a couple of weeks ago causing left rotator cuff injury and difficulty and pain with moving the left arm.    He has some numbness in the left arm and leg.  No weakness.   He does not recognize visual disturbances.    MRI - acute right PCA infarct. CT angio - right proximal PCA stenosis  BP is slightly elevated.  LDL 77.  A1c 6.3.  TTE - EF 40-45% Tele NSR with PVCs so far.        OBJECTIVE Vitals:   04/09/19 1633 04/09/19 2030 04/09/19 2339 04/10/19 0300  BP: 138/82 (!) 158/97 139/87 (!) 141/87  Pulse: 72 78 (!) 42 79  Resp: 19 18 19 17   Temp: 98.2 F (36.8 C) 98.3 F (36.8 C) 98.3 F (36.8 C) 98.6 F (37 C)  TempSrc: Oral Oral Oral  Oral  SpO2: 99% 95% 98% 96%  Weight:      Height:        CBC:  Recent Labs  Lab 04/03/19 0953 04/04/19 0501 04/09/19 0401 04/10/19 0320  WBC 10.5   < > 10.5 12.6*  NEUTROABS 7.3  --  6.7  --   HGB 14.7   < > 14.5 15.4  HCT 45.1   < > 43.4 45.8  MCV 95.1   < > 92.7 92.5  PLT 242   < > 245 246   < > = values in this interval not displayed.    Basic Metabolic Panel:  Recent Labs  Lab 04/03/19 1133 04/04/19 0501 04/09/19 0401 04/10/19 0320  NA  --    < > 135 140  K  --    < > 3.6 3.6  CL  --    < > 101 103  CO2  --    < > 25 22  GLUCOSE  --    < > 122* 121*  BUN  --    < > 22 21  CREATININE  --    < > 1.38* 1.35*  CALCIUM 6.1*   < > 7.3* 7.5*  MG 1.9  --   --   --   PHOS 3.6  --   --  3.3   < > = values in this interval not displayed.  Lipid Panel:     Component Value Date/Time   CHOL 129 04/04/2019 0500   TRIG 79 04/04/2019 0500   HDL 36 (L) 04/04/2019 0500   CHOLHDL 3.6 04/04/2019 0500   VLDL 16 04/04/2019 0500   LDLCALC 77 04/04/2019 0500   HgbA1c:  Lab Results  Component Value Date   HGBA1C 6.3 (H) 04/03/2019   Urine Drug Screen:     Component Value Date/Time   LABOPIA NONE DETECTED 04/09/2019 0404   COCAINSCRNUR NONE DETECTED 04/09/2019 0404   LABBENZ NONE DETECTED 04/09/2019 0404   AMPHETMU NONE DETECTED 04/09/2019 0404   THCU NONE DETECTED 04/09/2019 0404   LABBARB NONE DETECTED 04/09/2019 0404    Alcohol Level     Component Value Date/Time   ETH <10 04/09/2019 0404    IMAGING  CT ANGIO HEAD W OR WO CONTRAST CT ANGIO NECK W OR WO CONTRAST 04/09/2019 IMPRESSION:  1. Proximal right PCA occlusion.  2. Widely patent carotid and vertebral arteries.  3. Aortic Atherosclerosis (ICD10-I70.0).   DG Abd 1 View 04/09/2019 IMPRESSION:  NG tube is unchanged in positioning. If possible, further advancement into the stomach is recommended.   CT Head Wo Contrast 04/09/2019 IMPRESSION:  New area of hypodensity right occipital lobe compatible  with acute/subacute PCA infarct. No associated hemorrhage Mild atrophy and mild chronic microvascular ischemia.   MR BRAIN WO CONTRAST 04/09/2019 IMPRESSION:  1. Large acute to early subacute right PCA infarct.  2. Moderate chronic small vessel ischemic disease.   DG Chest Portable 1 View 04/09/2019 IMPRESSION:  No significant interval change in positioning of the NG tube. Consider further advancement with repeat radiograph to follow.    DG Chest Portable 1 View  04/09/2019 IMPRESSION:  Feeding tube is identified with distal tip probably at the GE junction. Advancement is recommended.   Transthoracic Echocardiogram  00/00/2021 Pending  ECG - SR rate 87 BPM. Frequent PVCs (See cardiology reading for complete details)  PHYSICAL EXAM Blood pressure (!) 141/87, pulse 79, temperature 98.6 F (37 C), temperature source Oral, resp. rate 17, height 6' (1.829 m), weight 108.9 kg, SpO2 96 %.  Awake, alert, fully oriented. PERL. There is a left homonymous hemianopsia. Strength 5/5 BUE and BLE, although will not move LUE due to shoulder pain from rotator cuff. Sensory - decreased pinprick on the LUE and LLE.   Coord- intact on RUE FTN; unable to LUE due to pain.    ASSESSMENT/PLAN [name] is a 84 y.o. male with history of HTN, arthtritis and HLD who presented to [facility] for left side weakness, speech problems and dysphagia. He did not receive IV t-PA due to late presentation (>4.5 hours from time of onset)  Stroke: Large acute to early subacute right PCA infarct - embolic - source unknown.  Resultant  Left HHA and left numbness  Code Stroke CT Head - not ordered  CT head - New area of hypodensity right occipital lobe compatible with acute/subacute PCA infarct. No associated hemorrhage Mild atrophy and mild chronic microvascular ischemia.   MRI head - Large acute to early subacute right PCA infarct. Moderate chronic small vessel ischemic disease  MRA head - not  ordered  CTA H&N - Proximal right PCA occlusion. Widely patent carotid and vertebral arteries.   CT Perfusion - not ordered  Carotid Doppler - CTA neck performed - carotid dopplers not indicated.  2D Echo - pending  Lacey Jensen Virus 2 - negative  LDL - 77  HgbA1c - 6.3  UDS -  negative  VTE prophylaxis - [place]er Heparin Diet  Diet Order            Diet NPO time specified  Diet effective now               No antithrombotic prior to admission, now on aspirin 300 mg suppository daily  Patient counseled to be compliant with his antithrombotic medications  Ongoing aggressive stroke risk factor management  Therapy recommendations:  pending  Disposition:  Pending  Hypertension  Home BP meds: Norvasc  Current BP meds: none  Stable . Permissive hypertension (OK if < 220/120) but gradually normalize in 5-7 days . Long-term BP goal normotensive  Hyperlipidemia  Home Lipid lowering medication: Lipitor 20 mg daily  LDL 77, goal < 70  Current lipid lowering medication: Lipitor 80 mg daily   Continue statin at discharge  Other Stroke Risk Factors  Advanced age  Chews tobacco - advised to stop smoking  Obesity, Body mass index is 32.55 kg/m., recommend weight loss, diet and exercise as appropriate   Other Active Problems  Code status - Full code.  Aortic Atherosclerosis (ICD10-I70.0)  Leukocytosis - 12.6 (afebrile)  CKD - stage 3a - creatinine - 1.35  Hypocalcemia - 7.5  Hospital day # 1  Impression:  Acute right PCA territory ischemic infarct causing right thalamic injury and right medial temporal lobe injury resulting in left homonymous hemianopsia and left sided numbness.    A proximal right PCA stenosis has been identified.  This may have occurred as a result of localized atherothrombosis secondary to hypertension or it may have resulted from cardioembolism to the right PCA secondary  to mild cardiomyopathy related paroxysmal atrial fibrillation.    For now, he is on ASA for secondary stroke prevention and statin for goal LDL<70.  I recommend 30 day Holter monitor and possible implanted loop recorder as outpatient if atrial fibrillation is not detected while in the hospital as that will change management to anticoagulation.  Starting tomorrow, we can start being more aggressive with normalizing his BP.   He does not smoke and is non-diabetic.  Rogue Jury, MS, MD  To contact Stroke Continuity provider, please refer to [email or web address] After hours, contact General Neurology

## 2019-04-10 NOTE — Progress Notes (Signed)
Occupational Therapy Evaluation  Clinical Impression: PTA pt lived with his wife, independent in ADLs and mobility. Pt currently requires supervision to max assist x 2 for self-care and transfer tasks. Pt tolerated sitting EOB ~5 min with max assist, noting right lateral and retro lean unable to self-correct. Pt transferred to bedside chair with max assist x 2 and heavy left lateral lean. Pt engaged in self-care tasks while seated in bedside chair. Pt able to recognize and utilize grooming equipment appropriately. Pt required max cues to incorporate LUE into all tasks, noting left inattention with right eye gaze preference. Provided multimodal cues to encourage visual scanning and attention to left side throughout session. Pt demonstrates decreased cognition, ROM, FMC, GMC, strength, endurance, balance, sitting/standing tolerance, activity tolerance, and safety awareness impacting ability to complete self-care and functional transfer tasks. Recommend skilled OT services to address above deficits in order to promote function and prevent further decline. Recommend CIR placement for additional rehab prior to discharge home.       04/10/19 0929  OT Visit Information  Last OT Received On 04/10/19  Assistance Needed +2  PT/OT/SLP Co-Evaluation/Treatment Yes  Reason for Co-Treatment Complexity of the patient's impairments (multi-system involvement);For patient/therapist safety;To address functional/ADL transfers  OT goals addressed during session ADL's and self-care;Strengthening/ROM  History of Present Illness 84 y.o. male with   With PMH HTN, HLD who presented to [facility] for increased left side weakness, speech problems and dysphagia. transferred to [facility]. [place] revealed a right occipital lobe hypodensity.MRI shows a right PCA infarct.  Precautions  Precautions Fall;Other (comment)  Precaution Comments left hemiparesis  Restrictions  Weight Bearing Restrictions No  Home Living  Family/patient expects to be  discharged to: Private residence  Living Arrangements Spouse/significant other  Available Help at Discharge Family;Available PRN/intermittently  Type of Home House  Home Layout Multi-level;Able to live on main level with bedroom/bathroom  Alternate Level Stairs-Number of Steps 6 steps going up, 9 steps going down when inside house. 9 steps to enter back door.   Alternate Level Stairs-Rails Right (going down stairs into basement)  Bathroom Shower/Tub Walk-in shower  Home Equipment Walker - standard;BSC;Grab bars - tub/shower  Prior Function  Level of Independence Independent  Comments Pt ambulates without an assistive device at baseline. Pt independent with ADLs.   Communication  Communication Other (comment) (Difficult to understand at times. Mumbled and slurred.)  Pain Assessment  Pain Assessment Faces  Faces Pain Scale 6  Pain Location Pain in bilateral ankles and feet. Pt reports numbness in bilateral feet. Grimacing and quick withdrawal of LLE when attempting ROM.  Pain Descriptors / Indicators Aching;Discomfort;Numbness  Pain Intervention(s) Monitored during session;Repositioned  Cognition  Arousal/Alertness Awake/alert  Behavior During Therapy WFL for tasks assessed/performed  Overall Cognitive Status Impaired/Different from baseline  Area of Impairment Orientation;Attention;Following commands;Safety/judgement;Awareness;Problem solving  Orientation Level Disoriented to;Time (Able to state month and year)  Current Attention Level Sustained  Following Commands Follows one step commands inconsistently;Follows one step commands with increased time (Pt easily distracted)  Safety/Judgement Decreased awareness of safety;Decreased awareness of deficits  Awareness Intellectual  Problem Solving Slow processing;Difficulty sequencing;Requires verbal cues;Requires tactile cues  General Comments Pt pleasant and willing to participate in therapy tasks. Pt noted to have left inattention with  right eye gaze preference. Pt continually perseverated on wanting a drink, however pt had not been evaluated by SLP to clear pt for a diet. Pt required mod redirection back to tasks as pt is easily distracted by environment and personal conversation.  Upper Extremity Assessment  Upper Extremity Assessment LUE deficits/detail;Generalized weakness (RUE ROM WFL)  LUE Deficits / Details PROM WFL. Contraction noted in LUE with some movement. Unable to initiate or complete shld flex, however pt able to hold shld in ~70 degrees flex for ~3 sec. Pt initiates minimal elbow flex able to move ~5-10 degrees. Pt unable to complete supination/pronation or wrist flex/ext. Pt able to make fist, however unable to extend digits.   LUE Sensation WNL (per pt report)  LUE Coordination decreased fine motor;decreased gross motor  Lower Extremity Assessment  Lower Extremity Assessment Defer to PT evaluation  ADL  Overall ADL's  Needs assistance/impaired  Eating/Feeding NPO  Grooming Moderate assistance;Sitting  Grooming Details (indicate cue type and reason) While seated in bedside chair. Completes with RUE only at supervision level. When attempting to use left hand pt requires max hand over hand assist.  Upper Body Bathing Moderate assistance;Sitting  Lower Body Bathing Maximal assistance;Sitting/lateral leans  Upper Body Dressing  Moderate assistance;Sitting  Lower Body Dressing Total assistance;+2 for physical assistance;Sit to/from Control and instrumentation engineer for physical assistance;BSC;Stand-pivot  Toileting- Clothing Manipulation and Hygiene Maximal assistance;+2 for safety/equipment;Sit to/from stand  Functional mobility during ADLs Maximal assistance;+2 for physical assistance (stand pivot to bedside chair)  General ADL Comments Pt tolerated sitting EOB ~5 min with max assist. Pt required max assist x 2 to stand pivot to bedside chair  Vision- Assessment  Vision Assessment? Vision  impaired- to be further tested in functional context  Additional Comments Pt unable to follow visual testing instructions. Pt with left inattention.   Bed Mobility  Overal bed mobility Needs Assistance  Bed Mobility Supine to Sit  Supine to sit Max assist;+2 for physical assistance  General bed mobility comments Assist for BLEs and trunk. Cues for hand placement, body positioning, and safety.   Transfers  Overall transfer level Needs assistance  Equipment used 2 person hand held assist (gait belt)  Transfers Sit to/from Stand;Stand Pivot Transfers  Sit to Stand Max assist;+2 physical assistance  Stand pivot transfers Max assist;+2 physical assistance  General transfer comment Heavy left lateral lean in standing. Max assist x 2 to stand with left knee blocked to prevent buckling.  Balance  Overall balance assessment Needs assistance  Sitting-balance support Feet supported;No upper extremity supported  Sitting balance-Leahy Scale Poor  Sitting balance - Comments Pt tolerated sitting EOB ~5 min with max assist to maintain balance.  Postural control Right lateral lean;Posterior lean  Standing balance support During functional activity  Standing balance-Leahy Scale Zero  Standing balance comment Heavy left lateral lean in standing  General Comments  General comments (skin integrity, edema, etc.) VSS on room air  OT - End of Session  Equipment Utilized During Treatment Gait belt  Activity Tolerance Patient limited by pain  Patient left in chair;with call bell/phone within reach;with chair alarm set  Nurse Communication Mobility status  OT Assessment  OT Recommendation/Assessment Patient needs continued OT Services  OT Visit Diagnosis Unsteadiness on feet (R26.81);Other abnormalities of gait and mobility (R26.89);Muscle weakness (generalized) (M62.81);Hemiplegia and hemiparesis;Pain  Hemiplegia - Right/Left Left  Hemiplegia - dominant/non-dominant  (unknown)  Hemiplegia - caused by  Cerebral infarction  Pain - Right/Left  (bilateral)  Pain - part of body Knee;Ankle and joints of foot  OT Problem List Decreased strength;Decreased range of motion;Decreased activity tolerance;Impaired balance (sitting and/or standing);Impaired vision/perception;Decreased coordination;Decreased cognition;Decreased safety awareness;Pain;Impaired UE functional use  OT Plan  OT Frequency (ACUTE ONLY) Min 3X/week  OT Treatment/Interventions (  ACUTE ONLY) Self-care/ADL training;Therapeutic exercise;Neuromuscular education;Energy conservation;DME and/or AE instruction;Therapeutic activities;Patient/family education;Balance training;Visual/perceptual remediation/compensation;Cognitive remediation/compensation  AM-PAC OT "6 Clicks" Daily Activity Outcome Measure (Version 2)  Help from another person eating meals? 1  Help from another person taking care of personal grooming? 2  Help from another person toileting, which includes using toliet, bedpan, or urinal? 2  Help from another person bathing (including washing, rinsing, drying)? 2  Help from another person to put on and taking off regular upper body clothing? 2  Help from another person to put on and taking off regular lower body clothing? 2  6 Click Score 11  OT Recommendation  Follow Up Recommendations CIR;Supervision/Assistance - 24 hour  OT Equipment Other (comment) (TBD at next venue of care)  Acute Rehab OT Goals  Patient Stated Goal Pt agreeable to working with therapy  Time For Goal Achievement 04/24/19  Potential to Achieve Goals Good  OT Time Calculation  OT Start Time (ACUTE ONLY) 0923  OT Stop Time (ACUTE ONLY) 1000  OT Time Calculation (min) 37 min  OT General Charges  $OT Visit 1 Visit  OT Evaluation  $OT Eval Moderate Complexity 1 Mod  Written Expression  Dominant Hand Right    Mauri Brooklyn OTR/L [phone]

## 2019-04-10 NOTE — Progress Notes (Signed)
Rehab Admissions Coordinator Note:  Patient was screened by Michel Santee for appropriateness for an Inpatient Acute Rehab Consult.  At this time, we are recommending Inpatient Rehab consult. I will place an order per our protocol.   Michel Santee 04/10/2019, 11:02 AM  I can be reached at [phone].

## 2019-04-10 NOTE — Evaluation (Signed)
Clinical/Bedside Swallow Evaluation Patient Details  Name: Curtis Jenkins MRN: [id number] Date of Birth: Mar 22, 1935  Today's Date: 04/10/2019 Time: SLP Start Time (ACUTE ONLY): 7782 SLP Stop Time (ACUTE ONLY): 1059 SLP Time Calculation (min) (ACUTE ONLY): 10 min  Past Medical History:  Past Medical History:  Diagnosis Date  . Arthritis   . Hyperlipidemia   . Hypertension    Past Surgical History: History reviewed. No pertinent surgical history. HPI:  Pt is an 84 yo male who presented to [facility] for worsening L sided weakness and speech/swallowing problems. He was transferred to [facility] for further management. MRI shows a large R PCA infarct. PMH includes: HTN, HLD, and per esophagram in 2002 [facility] and schiatzki's ring   Assessment / Plan / Recommendation Clinical Impression  Pt presents with left-sided facial weakness and difficulty with mastication, noting that he does not have his dentures at the moment but that he says he does not wear them for eating at home. Coughing is noted frequently with thin liquids, mostly delayed after the swallow and increasing in frequency when trying to use thin liquids as a liquid wash to clear solids from his oral cavity. Nectar thick liquids are consumed without any overt s/s of aspiration. Recommend starting Dys 2 diet and nectar thick liquids for today with potential need for MBS if signs of dysphagia persist. SLP Visit Diagnosis: Dysphagia, oropharyngeal phase (R13.12)    Aspiration Risk  Moderate aspiration risk    Diet Recommendation Dysphagia 2 (Fine chop);Nectar-thick liquid   Liquid Administration via: Cup;Straw Medication Administration: Whole meds with puree Supervision: Staff to assist with self feeding;Full supervision/cueing for compensatory strategies Compensations: Slow rate;Small sips/bites;Minimize environmental distractions Postural Changes: Seated upright at 90 degrees;Remain upright for at least 30 minutes after po intake    Other   Recommendations Oral Care Recommendations: Oral care BID Other Recommendations: Order thickener from pharmacy;Prohibited food (jello, ice cream, thin soups);Remove water pitcher   Follow up Recommendations Inpatient Rehab      Frequency and Duration min 2x/week  2 weeks       Prognosis Prognosis for Safe Diet Advancement: Good Barriers to Reach Goals: Cognitive deficits      Swallow Study   General HPI: Pt is an 84 yo male who presented to [facility] for worsening L sided weakness and speech/swallowing problems. He was transferred to [facility] for further management. MRI shows a large R PCA infarct. PMH includes: HTN, HLD, and per esophagram in 2002 [facility] and schiatzki's ring Type of Study: Bedside Swallow Evaluation Previous Swallow Assessment: none in chart Diet Prior to this Study: NPO Temperature Spikes Noted: No Respiratory Status: Room air History of Recent Intubation: No Behavior/Cognition: Alert;Cooperative;Impulsive;Distractible;Requires cueing Oral Cavity Assessment: Within Functional Limits Oral Care Completed by SLP: No Oral Cavity - Dentition: Edentulous;Dentures, not available Vision: Functional for self-feeding Self-Feeding Abilities: Needs assist Patient Positioning: Upright in chair Baseline Vocal Quality: Normal Volitional Cough: Strong Volitional Swallow: Able to elicit    Oral/Motor/Sensory Function Overall Oral Motor/Sensory Function: Mild impairment Facial ROM: Reduced left;Suspected CN VII (facial) dysfunction Facial Symmetry: Abnormal symmetry left;Suspected CN VII (facial) dysfunction Facial Strength: Reduced left;Suspected CN VII (facial) dysfunction Lingual ROM: Within Functional Limits Lingual Symmetry: Within Functional Limits Lingual Strength: Within Functional Limits   Ice Chips Ice chips: Within functional limits Presentation: Spoon   Thin Liquid Thin Liquid: Impaired Presentation: Cup;Self Fed;Spoon;Straw Pharyngeal  Phase Impairments: Cough -  Immediate;Cough - Delayed    Nectar Thick Nectar Thick Liquid: Within functional limits Presentation: Cup;Self Fed;Straw  Honey Thick Honey Thick Liquid: Not tested   Puree Puree: Within functional limits Presentation: Self Fed;Spoon   Solid     Solid: Impaired Presentation: Self Fed Oral Phase Impairments: Impaired mastication       Osie Bond., M.A. [place] Pager [phone] Office [phone]  04/10/2019,12:21 PM

## 2019-04-11 ENCOUNTER — Inpatient Hospital Stay (HOSPITAL_COMMUNITY): Payer: Medicare Other

## 2019-04-11 DIAGNOSIS — I69391 Dysphagia following cerebral infarction: Secondary | ICD-10-CM

## 2019-04-11 DIAGNOSIS — I63531 Cerebral infarction due to unspecified occlusion or stenosis of right posterior cerebral artery: Secondary | ICD-10-CM

## 2019-04-11 MED ORDER — ASPIRIN EC 81 MG PO TBEC
81.0000 mg | DELAYED_RELEASE_TABLET | Freq: Every day | ORAL | Status: DC
  Administered 2019-04-11 – 2019-04-13 (×3): 81 mg via ORAL
  Filled 2019-04-11 (×3): qty 1

## 2019-04-11 MED ORDER — ZOLPIDEM TARTRATE 5 MG PO TABS
5.0000 mg | ORAL_TABLET | Freq: Every evening | ORAL | Status: DC | PRN
  Administered 2019-04-12: 5 mg via ORAL
  Filled 2019-04-11: qty 1

## 2019-04-11 MED ORDER — MORPHINE SULFATE (PF) 2 MG/ML IV SOLN
2.0000 mg | Freq: Once | INTRAVENOUS | Status: AC
  Administered 2019-04-11: 2 mg via INTRAVENOUS
  Filled 2019-04-11: qty 1

## 2019-04-11 MED ORDER — NAPHAZOLINE-GLYCERIN 0.012-0.2 % OP SOLN
1.0000 [drp] | Freq: Four times a day (QID) | OPHTHALMIC | Status: DC | PRN
  Administered 2019-04-12: 2 [drp] via OPHTHALMIC
  Filled 2019-04-11: qty 15

## 2019-04-11 MED ORDER — CLOPIDOGREL BISULFATE 75 MG PO TABS
75.0000 mg | ORAL_TABLET | Freq: Every day | ORAL | Status: DC
  Administered 2019-04-11 – 2019-04-13 (×3): 75 mg via ORAL
  Filled 2019-04-11 (×3): qty 1

## 2019-04-11 NOTE — Progress Notes (Signed)
PROGRESS NOTE    Curtis Jenkins  ERX:[id number] DOB: 06/17/1935 DOA: 04/09/2019 PCP: Redmond School, MD  Brief Narrative:  84 year old gentleman with prior history of hypertension, hyperlipidemia presented to ED at [facility] for left-sided PE weakness, dysphagia and speech abnormalities going on for about a week.  He was transferred to [facility] for further evaluation of stroke and MRI of the brain.  Neurology was consulted.   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 No associated hemorrhage.  MRI of the brain without contrast shows Large acute to early subacute right PCA infarct. Moderate chronic small vessel ischemic disease.  CT angiogram of the head and neck showed Proximal right PCA occlusion.  Widely patent carotid and vertebral arteries.  LDL IS 77, hemoglobin A1c is 6.3, patient was started on Lipitor 80 mg daily. Therapy evaluations recommending CIR, but pt is deconditioned and overall rehab prognosis is poor hence plan for SNF on discharge Neurology on board,   Echocardiogram ordered.  Started the patient on aspirin 81 mg and Plavix 75 mg, plan to continue both for 3 weeks followed by aspirin alone.  Patient is advised not to drive till his peripheral vision improves. Plan for loop recorder placement for evaluation of paroxysmal atrial fibrillation.    Essential hypertension Started the patient on Norvasc continue the same.  Stage II CKD Creatinine appears to be at baseline at 1.3.   Hyperlipidemia Patient is currently on Lipitor 80 mg  daily.  Dysphagia and speech abnormality SLP eval/cognitive language evaluation ordered. SLP eval recommending dysphagia 2 diet with nectar thick liquids.   History of chronic back pain as per history from the daughter at bedside Patient is on tramadol.  Patient had recent falls on to  the left side and currently is unable to move left arm. X-rays of bilateral knees and CT of the shoulder ordered for further evaluation. Pain control and physical therapy   DVT prophylaxis: Heparin Code Status: Full code  family Communication: None at bedside Disposition Plan:  . Patient came from: Home           . Anticipated d/c place: SNF  . Barriers to d/c OR conditions which need to be met to effect a safe d/c: TOC on board for SNF placement.    Consultants:   Neurology  Procedures: MRI of the brain  Antimicrobials: None   Subjective: Patient denies any chest pain or shortness of breath, no nausea or vomiting.  He denies any headache or dizziness.  Patient thinks that he is in his house  Objective: Vitals:   04/10/19 2346 04/11/19 0339 04/11/19 0739 04/11/19 1209  BP: 134/71 (!) 147/79 (!) 149/89 128/74  Pulse: 68 69 75 100  Resp: (!) 22 18 18 18   Temp: 98.5 F (36.9 C) 98.3 F (36.8 C) 98.7 F (37.1 C) 98.2 F (36.8 C)  TempSrc: Oral Oral Oral Oral  SpO2: 97% 97%  97%  Weight:      Height:        Intake/Output Summary (Last 24 hours) at 04/11/2019 1505 Last data filed at 04/11/2019 0900 Gross  per 24 hour  Intake 703.37 ml  Output --  Net 703.37 ml   Filed Weights   04/09/19 0330  Weight: 108.9 kg    Examination:  General exam: Alert and comfortable, not in any kind of distress Respiratory system: Clear to auscultation bilaterally, no wheezing or rhonchi. Cardiovascular system: S1-S2 heard, regular rate rhythm, no JVD Gastrointestinal system: Abdomen is soft, nontender nondistended bowel sounds normal. Central nervous system: Alert, able to answer questions  appropriately today , following commands Extremities: No cyanosis or clubbing, unable to move left upper extremity from pain Skin: No rashes seen Psychiatry: Mood is appropriate   Data Reviewed: I have personally reviewed following labs and imaging studies  CBC: Recent Labs  Lab 04/09/19 0401 04/10/19 0320  WBC 10.5 12.6*  NEUTROABS 6.7  --   HGB 14.5 15.4  HCT 43.4 45.8  MCV 92.7 92.5  PLT 245 342   Basic Metabolic Panel: Recent Labs  Lab 04/09/19 0401 04/10/19 0320  NA 135 140  K 3.6 3.6  CL 101 103  CO2 25 22  GLUCOSE 122* 121*  BUN 22 21  CREATININE 1.38* 1.35*  CALCIUM 7.3* 7.5*  PHOS  --  3.3   GFR: Estimated Creatinine Clearance: 53.8 mL/min (A) (by C-G formula based on SCr of 1.35 mg/dL (H)). Liver Function Tests: Recent Labs  Lab 04/09/19 0401 04/10/19 0320  AST 24  --   ALT 18  --   ALKPHOS 99  --   BILITOT 1.2  --   PROT 7.3  --   ALBUMIN 4.0 3.6   Recent Labs  Lab 04/09/19 0401  LIPASE 25   No results for input(s): AMMONIA in the last 168 hours. Coagulation Profile: Recent Labs  Lab 04/09/19 0401  INR 1.2   Cardiac Enzymes: No results for input(s): CKTOTAL, CKMB, CKMBINDEX, TROPONINI in the last 168 hours. BNP (last 3 results) No results for input(s): PROBNP in the last 8760 hours. HbA1C: No results for input(s): HGBA1C in the last 72 hours. CBG: Recent Labs  Lab 04/09/19 1530  GLUCAP 104*   Lipid Profile: No results for input(s): CHOL, HDL, LDLCALC, TRIG, CHOLHDL, LDLDIRECT in the last 72 hours. Thyroid Function Tests: No results for input(s): TSH, T4TOTAL, FREET4, T3FREE, THYROIDAB in the last 72 hours. Anemia Panel: No results for input(s): VITAMINB12, FOLATE, FERRITIN, TIBC, IRON, RETICCTPCT in the last 72 hours. Sepsis Labs: No results for input(s): PROCALCITON, LATICACIDVEN in the last 168 hours.  Recent Results (from the past 240 hour(s))  SARS CORONAVIRUS 2 (TAT 6-24 HRS) Nasopharyngeal Nasopharyngeal Swab      Status: None   Collection Time: 04/03/19 11:42 AM   Specimen: Nasopharyngeal Swab  Result Value Ref Range Status   SARS Coronavirus 2 NEGATIVE NEGATIVE Final    Comment: (NOTE) SARS-CoV-2 target nucleic acids are NOT DETECTED. The SARS-CoV-2 RNA is generally detectable in upper and lower respiratory specimens during the acute phase of infection. Negative results do not preclude SARS-CoV-2 infection, do not rule out co-infections with other pathogens, and should not be used as the sole basis for treatment or other patient management decisions. Negative results must be combined with clinical observations, patient history, and epidemiological information. The expected result is Negative. Fact Sheet for Patients: SugarRoll.be Fact Sheet for Healthcare Providers: https://www.woods-mathews.com/ This test is not yet approved or cleared by the [place] FDA and  has been authorized for detection and/or diagnosis of SARS-CoV-2 by FDA under an Emergency Use Authorization (EUA). This EUA will remain  in effect (meaning this test can be used) for the duration of the COVID-19 declaration under Section 56 4(b)(1) of the Act, 21 U.S.C. section 360bbb-3(b)(1), unless the authorization is terminated or revoked sooner. Performed at [facility], [place] [address], [place], [place] [address]   SARS CORONAVIRUS 2 (TAT 6-24 HRS) Nasopharyngeal Nasopharyngeal Swab     Status: None   Collection Time: 04/09/19  7:25 AM   Specimen: Nasopharyngeal Swab  Result Value Ref Range Status   SARS Coronavirus 2 NEGATIVE NEGATIVE Final    Comment: (NOTE) SARS-CoV-2 target nucleic acids are NOT DETECTED. The SARS-CoV-2 RNA is generally detectable in upper and lower respiratory specimens during the acute phase of infection. Negative results do not preclude SARS-CoV-2 infection, do not rule out co-infections with other pathogens, and should not be used as the sole  basis for treatment or other patient management decisions. Negative results must be combined with clinical observations, patient history, and epidemiological information. The expected result is Negative. Fact Sheet for Patients: SugarRoll.be Fact Sheet for Healthcare Providers: https://www.woods-mathews.com/ This test is not yet approved or cleared by the [place] FDA and  has been authorized for detection and/or diagnosis of SARS-CoV-2 by FDA under an Emergency Use Authorization (EUA). This EUA will remain  in effect (meaning this test can be used) for the duration of the COVID-19 declaration under Section 56 4(b)(1) of the Act, 21 U.S.C. section 360bbb-3(b)(1), unless the authorization is terminated or revoked sooner. Performed at [facility], [place] [address], [place], [place] [address]          Radiology Studies: CT ANGIO HEAD W OR WO CONTRAST  Result Date: 04/09/2019 CLINICAL DATA:  Stroke follow-up. Large right PCA infarct. EXAM: CT ANGIOGRAPHY HEAD AND NECK TECHNIQUE: Multidetector CT imaging of the head and neck was performed using the standard protocol during bolus administration of intravenous contrast. Multiplanar CT image reconstructions and MIPs were obtained to evaluate the vascular anatomy. Carotid stenosis measurements (when applicable) are obtained utilizing NASCET criteria, using the distal internal carotid diameter as the denominator. CONTRAST:  17m OMNIPAQUE IOHEXOL 350 MG/ML SOLN COMPARISON:  None. FINDINGS: CTA NECK FINDINGS Aortic arch: Standard 3 vessel aortic arch with mild atherosclerotic plaque. No arch vessel origin stenosis. Right carotid system: Patent with small volume soft plaque at the carotid bifurcation. No evidence of stenosis or dissection. Left carotid system: Patent with small volume soft plaque at the common carotid artery origin and carotid bifurcation. No evidence of stenosis or dissection.  Tortuous mid cervical ICA. Vertebral arteries: Patent and codominant without evidence of significant stenosis or dissection. Skeleton: Advanced C1-2 arthropathy with multiple erosions in the dens. Advanced spondylosis with bulky anterior vertebral ossification extending from C2 into the included thoracic spine. Severe facet arthrosis at C7-T1 with grade 1 anterolisthesis. Severe multilevel neural foraminal stenosis. Other neck: No evidence of cervical lymphadenopathy or mass. Nasogastric tube forms a loop in the oropharynx. Upper chest: Clear lung apices. Review of the MIP images confirms the above findings CTA HEAD FINDINGS Anterior circulation: The internal carotid arteries are patent from skull base to carotid termini with mild nonstenotic plaque bilaterally. ACAs and MCAs are patent without evidence of proximal branch occlusion or significant proximal stenosis. The left ACA is dominant. No aneurysm is identified. Posterior circulation: The intracranial vertebral arteries are patent to the basilar with atherosclerotic irregularity bilaterally but no significant stenosis. Patent left PICA, right AICA, and bilateral SCAs are visualized. The basilar artery is widely patent. There is  occlusion of the right PCA at the P1 level. The left PCA is patent. No aneurysm is identified. Venous sinuses: As permitted by contrast timing, patent. Anatomic variants: None of significance. Review of the MIP images confirms the above findings IMPRESSION: 1. Proximal right PCA occlusion. 2. Widely patent carotid and vertebral arteries. 3. Aortic Atherosclerosis (ICD10-I70.0). Electronically Signed   By: Logan Bores M.D.   On: 04/09/2019 21:47   DG Abd 1 View  Result Date: 04/09/2019 CLINICAL DATA:  NG tube placement EXAM: ABDOMEN - 1 VIEW COMPARISON:  April 09, 2019 FINDINGS: The NG tube again projects over the left upper quadrant with the side hole near the GE junction. The bowel gas pattern is nonobstructive and nonspecific.  There are degenerative changes of the lumbar spine. IMPRESSION: NG tube is unchanged in positioning. If possible, further advancement into the stomach is recommended. Electronically Signed   By: Constance Holster M.D.   On: 04/09/2019 19:11   CT ANGIO NECK W OR WO CONTRAST  Result Date: 04/09/2019 CLINICAL DATA:  Stroke follow-up. Large right PCA infarct. EXAM: CT ANGIOGRAPHY HEAD AND NECK TECHNIQUE: Multidetector CT imaging of the head and neck was performed using the standard protocol during bolus administration of intravenous contrast. Multiplanar CT image reconstructions and MIPs were obtained to evaluate the vascular anatomy. Carotid stenosis measurements (when applicable) are obtained utilizing NASCET criteria, using the distal internal carotid diameter as the denominator. CONTRAST:  46m OMNIPAQUE IOHEXOL 350 MG/ML SOLN COMPARISON:  None. FINDINGS: CTA NECK FINDINGS Aortic arch: Standard 3 vessel aortic arch with mild atherosclerotic plaque. No arch vessel origin stenosis. Right carotid system: Patent with small volume soft plaque at the carotid bifurcation. No evidence of stenosis or dissection. Left carotid system: Patent with small volume soft plaque at the common carotid artery origin and carotid bifurcation. No evidence of stenosis or dissection. Tortuous mid cervical ICA. Vertebral arteries: Patent and codominant without evidence of significant stenosis or dissection. Skeleton: Advanced C1-2 arthropathy with multiple erosions in the dens. Advanced spondylosis with bulky anterior vertebral ossification extending from C2 into the included thoracic spine. Severe facet arthrosis at C7-T1 with grade 1 anterolisthesis. Severe multilevel neural foraminal stenosis. Other neck: No evidence of cervical lymphadenopathy or mass. Nasogastric tube forms a loop in the oropharynx. Upper chest: Clear lung apices. Review of the MIP images confirms the above findings CTA HEAD FINDINGS Anterior circulation: The  internal carotid arteries are patent from skull base to carotid termini with mild nonstenotic plaque bilaterally. ACAs and MCAs are patent without evidence of proximal branch occlusion or significant proximal stenosis. The left ACA is dominant. No aneurysm is identified. Posterior circulation: The intracranial vertebral arteries are patent to the basilar with atherosclerotic irregularity bilaterally but no significant stenosis. Patent left PICA, right AICA, and bilateral SCAs are visualized. The basilar artery is widely patent. There is occlusion of the right PCA at the P1 level. The left PCA is patent. No aneurysm is identified. Venous sinuses: As permitted by contrast timing, patent. Anatomic variants: None of significance. Review of the MIP images confirms the above findings IMPRESSION: 1. Proximal right PCA occlusion. 2. Widely patent carotid and vertebral arteries. 3. Aortic Atherosclerosis (ICD10-I70.0). Electronically Signed   By: ALogan BoresM.D.   On: 04/09/2019 21:47   MR BRAIN WO CONTRAST  Result Date: 04/09/2019 CLINICAL DATA:  Right occipital infarct. EXAM: MRI HEAD WITHOUT CONTRAST TECHNIQUE: Multiplanar, multiecho pulse sequences of the brain and surrounding structures were obtained without intravenous contrast. COMPARISON:  Head CT  04/09/2019 FINDINGS: Multiple sequences are up to moderately motion degraded. Brain: As seen on today's earlier CT, there is a large acute to early subacute right PCA infarct involving much of the right occipital lobe and portions of the posteromedial right temporal lobe and thalamus. There is cytotoxic edema with regional sulcal effacement and mild mass effect on the right lateral ventricle. No midline shift, intracranial hemorrhage, or extra-axial fluid collection is identified. Patchy T2 hyperintensities elsewhere in the cerebral white matter bilaterally are nonspecific but compatible with moderate chronic small vessel ischemic disease. Mild cerebral atrophy is  within normal limits for age. Vascular: Major intracranial vascular flow voids are preserved. Skull and upper cervical spine: No suspicious marrow lesion. Cervical spondylosis. Sinuses/Orbits: Right cataract extraction. Paranasal sinuses and mastoid air cells are clear. Other: None. IMPRESSION: 1. Large acute to early subacute right PCA infarct. 2. Moderate chronic small vessel ischemic disease. Electronically Signed   By: Logan Bores M.D.   On: 04/09/2019 17:50   DG Chest Portable 1 View  Result Date: 04/09/2019 CLINICAL DATA:  NG tube placement. EXAM: PORTABLE CHEST 1 VIEW COMPARISON:  April 09, 2019 FINDINGS: The heart size is mildly enlarged. The enteric tube tip projects over the gastric body. There is no pneumothorax. No large pleural effusion. No evidence for focal infiltrate. IMPRESSION: No significant interval change in positioning of the NG tube. Consider further advancement with repeat radiograph to follow. Electronically Signed   By: Constance Holster M.D.   On: 04/09/2019 15:34   ECHOCARDIOGRAM COMPLETE  Result Date: 04/10/2019    ECHOCARDIOGRAM REPORT   Patient Name:   Curtis Jenkins Date of Exam: 04/10/2019 Medical Rec #:  [id number]        Height:       72.0 in Accession #:    [id number]       Weight:       240.0 lb Date of Birth:  Nov 10, 1935         BSA:          2.302 m Patient Age:    80 years         BP:           152/68 mmHg Patient Gender: M                HR:           68 bpm. Exam Location:  Inpatient Procedure: 2D Echo Indications:    stroke  History:        Patient has no prior history of Echocardiogram examinations.                 Risk Factors:Dyslipidemia and Hypertension.  Sonographer:    Johny Chess Referring Phys: [id number] COURAGE EMOKPAE  Sonographer Comments: Image acquisition challenging due to uncooperative patient. IMPRESSIONS  1. Left ventricular ejection fraction, by estimation, is 40 to 45%. The left ventricle has mildly decreased function. The left ventricle  demonstrates global hypokinesis. The left ventricular internal cavity size was mildly dilated. Left ventricular diastolic parameters are consistent with Grade I diastolic dysfunction (impaired relaxation).  2. Right ventricular systolic function is normal. The right ventricular size is normal.  3. The mitral valve is grossly normal. Trivial mitral valve regurgitation. No evidence of mitral stenosis.  4. The aortic valve is normal in structure. Aortic valve regurgitation is not visualized. No aortic stenosis is present. FINDINGS  Left Ventricle: Left ventricular ejection fraction, by estimation, is 40 to 45%. The left ventricle has mildly decreased  function. The left ventricle demonstrates global hypokinesis. The left ventricular internal cavity size was mildly dilated. There is  no left ventricular hypertrophy. Left ventricular diastolic parameters are consistent with Grade I diastolic dysfunction (impaired relaxation). Right Ventricle: The right ventricular size is normal. No increase in right ventricular wall thickness. Right ventricular systolic function is normal. Left Atrium: Left atrial size was normal in size. Right Atrium: Right atrial size was normal in size. Pericardium: There is no evidence of pericardial effusion. Mitral Valve: The mitral valve is grossly normal. Trivial mitral valve regurgitation. No evidence of mitral valve stenosis. Tricuspid Valve: The tricuspid valve is grossly normal. Tricuspid valve regurgitation is not demonstrated. No evidence of tricuspid stenosis. Aortic Valve: The aortic valve is normal in structure. Aortic valve regurgitation is not visualized. No aortic stenosis is present. Pulmonic Valve: The pulmonic valve was grossly normal. Pulmonic valve regurgitation is not visualized. No evidence of pulmonic stenosis. Aorta: The aortic root and ascending aorta are structurally normal, with no evidence of dilitation. IAS/Shunts: The atrial septum is grossly normal.  LEFT VENTRICLE  PLAX 2D LVIDd:         5.40 cm      Diastology LVIDs:         3.90 cm      LV e' lateral:   6.42 cm/s LV PW:         0.90 cm      LV E/e' lateral: 10.0 LV IVS:        1.00 cm      LV e' medial:    4.68 cm/s LVOT diam:     2.20 cm      LV E/e' medial:  13.7 LV SV:         67 LV SV Index:   29 LVOT Area:     3.80 cm  LV Volumes (MOD) LV vol d, MOD A2C: 129.0 ml LV vol d, MOD A4C: 122.0 ml LV vol s, MOD A2C: 69.2 ml LV vol s, MOD A4C: 73.6 ml LV SV MOD A2C:     59.8 ml LV SV MOD A4C:     122.0 ml LV SV MOD BP:      55.7 ml RIGHT VENTRICLE RV S prime:     16.20 cm/s TAPSE (M-mode): 2.2 cm LEFT ATRIUM             Index       RIGHT ATRIUM           Index LA diam:        3.10 cm 1.35 cm/m  RA Area:     14.20 cm LA Vol (A2C):   73.6 ml 31.97 ml/m RA Volume:   32.30 ml  14.03 ml/m LA Vol (A4C):   60.8 ml 26.41 ml/m LA Biplane Vol: 70.2 ml 30.49 ml/m  AORTIC VALVE LVOT Vmax:   92.70 cm/s LVOT Vmean:  60.200 cm/s LVOT VTI:    0.176 m MITRAL VALVE MV Area (PHT): 2.66 cm    SHUNTS MV Decel Time: 285 msec    Systemic VTI:  0.18 m MV E velocity: 64.30 cm/s  Systemic Diam: 2.20 cm MV A velocity: 87.40 cm/s MV E/A ratio:  0.74 Mertie Moores MD Electronically signed by Mertie Moores MD Signature Date/Time: 04/10/2019/3:12:04 PM    Final         Scheduled Meds: . aspirin EC  81 mg Oral Daily  . atorvastatin  80 mg Oral QHS  . calcium-vitamin D  1 tablet Oral BID  WC  . clopidogrel  75 mg Oral Daily  . feeding supplement (ENSURE ENLIVE)  237 mL Oral TID PC  . heparin  5,000 Units Subcutaneous Q8H  . sodium chloride flush  3 mL Intravenous Q12H  . Vitamin D (Ergocalciferol)  50,000 Units Oral Q7 days   Continuous Infusions: . sodium chloride    . sodium chloride 75 mL/hr at 04/11/19 0523     LOS: 2 days        Hosie Poisson, MD [facility]   To contact the attending provider between 7A-7P or the covering provider during after hours 7P-7A, please log into the web site [email or web address] and access  using universal [facility] password for that web site. If you do not have the password, please call the hospital operator.  04/11/2019, 3:05 PM

## 2019-04-11 NOTE — Progress Notes (Signed)
Inpatient Rehab Admissions:  Inpatient Rehab Consult received.  I met with patient and his grand daughter at the bedside for rehabilitation assessment and to discuss goals and expectations of an inpatient rehab admission, pt's daughter on the phone.  Discussed CIR MD's recommendation for SNF.  Family would like to discuss options SNF versus HHPT.  I let TOC team know to f/u.  Will sign off for CIR at this time.   Signed: Shann Medal, PT, DPT Admissions Coordinator [phone] 04/11/19  3:06 PM

## 2019-04-11 NOTE — Progress Notes (Addendum)
Physical Therapy Treatment Patient Details Name: Curtis Jenkins MRN: [id number] DOB: 07-17-1935 Today's Date: 04/11/2019    History of Present Illness 84 y.o. male with   With PMH HTN, HLD who presented to [facility] for increased left side weakness, speech problems and dysphagia. transferred to [facility]. Hope revealed a right occipital lobe hypodensity.MRI shows a right PCA infarct.    PT Comments    Pt supine in bed on arrival.  He is reluctant to mobilize due to fear of pain.  His L side is very sensitive to touch.  He is also concerned with arthritic pain throughout his joints.  He was able to mobilize to edge of bed and maintain sitting edge of bed.  He continues to have difficulty mobilizing into standing.  He currently requires +2 max assistance.  Pt remains appropriate for CIR therapies to improve strength and function before returning home.    Follow Up Recommendations  CIR     Equipment Recommendations  Rolling walker with 5" wheels;3in1 (PT);Wheelchair (measurements PT);Wheelchair cushion (measurements PT);Other (comment)    Recommendations for Other Services       Precautions / Restrictions Precautions Precautions: Fall Precaution Comments: L hemiparesis Restrictions Weight Bearing Restrictions: No    Mobility  Bed Mobility Overal bed mobility: Needs Assistance Bed Mobility: Supine to Sit;Sit to Supine     Supine to sit: Max assist;+2 for physical assistance;+2 for safety/equipment Sit to supine: Total assist;+2 for physical assistance   General bed mobility comments: Multimodal cueing to initiate; heavy mod assist to help LEs clear EOB; Max assist to elevate trunk to sit and stabilize.  Pt able to sit for nursing to wash his back and perform assessment.  Transfers Overall transfer level: Needs assistance Equipment used: None(Face to face to clear buttocks for linen change) Transfers: Sit to/from Stand Sit to Stand: Max assist;+2 physical assistance         General  transfer comment: Face to face with anterior weight shifting and blocking of B knees.  Pt unable to achieve extension into standing.  Ambulation/Gait Ambulation/Gait assistance: (NT)               Stairs             Wheelchair Mobility    Modified Rankin (Stroke Patients Only)       Balance Overall balance assessment: Needs assistance Sitting-balance support: Feet supported;No upper extremity supported Sitting balance-Leahy Scale: Poor Sitting balance - Comments: Pt tolerated sitting EOB ~5 min with max assist to maintain balance.     Standing balance-Leahy Scale: Zero                              Cognition Arousal/Alertness: Awake/alert Behavior During Therapy: WFL for tasks assessed/performed Overall Cognitive Status: Impaired/Different from baseline Area of Impairment: Orientation;Attention;Safety/judgement;Awareness;Problem solving;Following commands                 Orientation Level: Disoriented to;Time Current Attention Level: Sustained   Following Commands: Follows one step commands with increased time Safety/Judgement: Decreased awareness of safety;Decreased awareness of deficits Awareness: Intellectual Problem Solving: Slow processing;Difficulty sequencing;Requires verbal cues;Requires tactile cues        Exercises      General Comments        Pertinent Vitals/Pain Pain Assessment: Faces Faces Pain Scale: Hurts little more Pain Location: Back and LUE and LLE, also noted with cervical pain. Pain Descriptors / Indicators: Discomfort;Sore(arthritic pain in neck, back and  knees, nerve pain in LUE/LLE) Pain Intervention(s): Monitored during session;Repositioned    Home Living                      Prior Function            PT Goals (current goals can now be found in the care plan section) Acute Rehab PT Goals Patient Stated Goal: To get his bed changed and reduce pain Potential to Achieve Goals: Fair Progress  towards PT goals: Progressing toward goals    Frequency    Min 4X/week      PT Plan Current plan remains appropriate    Co-evaluation              AM-PAC PT "6 Clicks" Mobility   Outcome Measure  Help needed turning from your back to your side while in a flat bed without using bedrails?: Total Help needed moving from lying on your back to sitting on the side of a flat bed without using bedrails?: Total Help needed moving to and from a bed to a chair (including a wheelchair)?: Total Help needed standing up from a chair using your arms (e.g., wheelchair or bedside chair)?: Total Help needed to walk in hospital room?: Total Help needed climbing 3-5 steps with a railing? : Total 6 Click Score: 6    End of Session Equipment Utilized During Treatment: Gait belt Activity Tolerance: Patient tolerated treatment well Patient left: ;in bed;with bed alarm set(in chair position.)needs in reach Nurse Communication: Mobility status PT Visit Diagnosis: Other abnormalities of gait and mobility (R26.89);Hemiplegia and hemiparesis Hemiplegia - Right/Left: Left Hemiplegia - dominant/non-dominant: Non-dominant Hemiplegia - caused by: Cerebral infarction     Time: 9518-8416 PT Time Calculation (min) (ACUTE ONLY): 27 min  Charges:  $Therapeutic Activity: 23-37 mins                     Erasmo Leventhal , PTA Acute Rehabilitation Services Pager [phone] Office [phone]     Calistro Rauf Eli Hose 04/11/2019, 3:08 PM

## 2019-04-11 NOTE — Plan of Care (Signed)
  Problem: Activity: Goal: Risk for activity intolerance will decrease Outcome: Progressing  Patient able to participate in physical therapy today.  Problem: Education: Goal: Knowledge of General Education information will improve Description: Including pain rating scale, medication(s)/side effects and non-pharmacologic comfort measures Outcome: Not Progressing  Patient attempted to refuse food and fluid in order to avoid urination, refuses condom cath.  Problem: Clinical Measurements: Goal: Will remain free from infection Outcome: Not Progressing  Patient afebrile this shift.  Problem: Clinical Measurements: Goal: Respiratory complications will improve Outcome: Not Progressing  Patient without desaturations or dyspnea on getting up with PT to edge of bed.

## 2019-04-11 NOTE — Progress Notes (Signed)
STROKE TEAM PROGRESS NOTE      INTERVAL HISTORY I have personally reviewed history of presenting illness with the patient, electronic medical records and imaging films in PACS.  Presented initially last Sunday with left-sided numbness and some gait ataxia and subsequently got worse last Friday with left-sided weakness and suspect peripheral vision loss.  MRI confirmed right PCA infarct.  Echocardiogram shows ejection fraction of 40 to 45% but without definite clot.  Urine drug screen is negative.  LDL cholesterol 77 mg percent and hemoglobin A1c 6.3.  Patient does have obstructive sleep apnea but admits he is not compliant with using his CPAP every night      OBJECTIVE Vitals:   04/10/19 2346 04/11/19 0339 04/11/19 0739 04/11/19 1209  BP: 134/71 (!) 147/79 (!) 149/89 128/74  Pulse: 68 69 75 100  Resp: (!) 22 18 18 18   Temp: 98.5 F (36.9 C) 98.3 F (36.8 C) 98.7 F (37.1 C) 98.2 F (36.8 C)  TempSrc: Oral Oral Oral Oral  SpO2: 97% 97%  97%  Weight:      Height:        CBC:  Recent Labs  Lab 04/09/19 0401 04/10/19 0320  WBC 10.5 12.6*  NEUTROABS 6.7  --   HGB 14.5 15.4  HCT 43.4 45.8  MCV 92.7 92.5  PLT 245 299    Basic Metabolic Panel:  Recent Labs  Lab 04/09/19 0401 04/10/19 0320  NA 135 140  K 3.6 3.6  CL 101 103  CO2 25 22  GLUCOSE 122* 121*  BUN 22 21  CREATININE 1.38* 1.35*  CALCIUM 7.3* 7.5*  PHOS  --  3.3    Lipid Panel:     Component Value Date/Time   CHOL 129 04/04/2019 0500   TRIG 79 04/04/2019 0500   HDL 36 (L) 04/04/2019 0500   CHOLHDL 3.6 04/04/2019 0500   VLDL 16 04/04/2019 0500   LDLCALC 77 04/04/2019 0500   HgbA1c:  Lab Results  Component Value Date   HGBA1C 6.3 (H) 04/03/2019   Urine Drug Screen:     Component Value Date/Time   LABOPIA NONE DETECTED 04/09/2019 0404   COCAINSCRNUR NONE DETECTED 04/09/2019 0404   LABBENZ NONE DETECTED 04/09/2019 0404   AMPHETMU NONE DETECTED 04/09/2019 0404   THCU NONE DETECTED 04/09/2019  0404   LABBARB NONE DETECTED 04/09/2019 0404    Alcohol Level     Component Value Date/Time   ETH <10 04/09/2019 0404    IMAGING  CT ANGIO HEAD W OR WO CONTRAST CT ANGIO NECK W OR WO CONTRAST 04/09/2019 IMPRESSION:  1. Proximal right PCA occlusion.  2. Widely patent carotid and vertebral arteries.  3. Aortic Atherosclerosis (ICD10-I70.0).   DG Abd 1 View 04/09/2019 IMPRESSION:  NG tube is unchanged in positioning. If possible, further advancement into the stomach is recommended.   CT Head Wo Contrast 04/09/2019 IMPRESSION:  New area of hypodensity right occipital lobe compatible with acute/subacute PCA infarct. No associated hemorrhage Mild atrophy and mild chronic microvascular ischemia.   MR BRAIN WO CONTRAST 04/09/2019 IMPRESSION:  1. Large acute to early subacute right PCA infarct.  2. Moderate chronic small vessel ischemic disease.   DG Chest Portable 1 View 04/09/2019 IMPRESSION:  No significant interval change in positioning of the NG tube. Consider further advancement with repeat radiograph to follow.    DG Chest Portable 1 View  04/09/2019 IMPRESSION:  Feeding tube is identified with distal tip probably at the GE junction. Advancement is recommended.   Transthoracic Echocardiogram  00/00/2021 Pending  ECG - SR rate 87 BPM. Frequent PVCs (See cardiology reading for complete details)  PHYSICAL EXAM Blood pressure 128/74, pulse 100, temperature 98.2 F (36.8 C), temperature source Oral, resp. rate 18, height 6' (1.829 m), weight 108.9 kg, SpO2 97 %. Pleasant mildly obese elderly Caucasian male not in distress. . Afebrile. Head is nontraumatic. Neck is supple without bruit.    Cardiac exam no murmur or gallop. Lungs are clear to auscultation. Distal pulses are well felt. Neurological Exam : Awake alert oriented to time place and person.  Speech and language appear normal.  Extraocular movements are full range no nystagmus.  Left dense homonymous hemianopsia.   Face is symmetric without weakness.  Tongue midline.  Mild left upper extremity weakness secondary to shoulder pain.  Left body subjective diminished touch pinprick sensation.  Deep tendon reflexes are symmetric.  Plantars downgoing.  Gait not tested. ASSESSMENT/PLAN Mr. TITO AUSMUS is a 84 y.o. male with history of HTN, arthtritis and HLD who presented to [facility] for left side weakness, speech problems and dysphagia. He did not receive IV t-PA due to late presentation (>4.5 hours from time of onset)  Stroke: Large acute to early subacute right PCA infarct - embolic - source unknown.  Resultant  Left HHA and left numbness  Code Stroke CT Head - not ordered  CT head - New area of hypodensity right occipital lobe compatible with acute/subacute PCA infarct. No associated hemorrhage Mild atrophy and mild chronic microvascular ischemia.   MRI head - Large acute to early subacute right PCA infarct. Moderate chronic small vessel ischemic disease  MRA head - not ordered  CTA H&N - Proximal right PCA occlusion. Widely patent carotid and vertebral arteries.   CT Perfusion - not ordered  Carotid Doppler - CTA neck performed - carotid dopplers not indicated.  2D Echo - pending  Lacey Jensen Virus 2 - negative  LDL - 77  HgbA1c - 6.3  UDS - negative  VTE prophylaxis - [place] Heparin Diet  Diet Order            DIET DYS 2 Room service appropriate? Yes; Fluid consistency: Nectar Thick  Diet effective now              No antithrombotic prior to admission, now on aspirin 81 mg daily and Plavix 75 mg daily into 3 weeks followed by aspirin alone  Patient counseled to be compliant with his antithrombotic medications  Ongoing aggressive stroke risk factor management  Therapy recommendations: Inpatient rehab disposition:  Pending  Hypertension  Home BP meds: Norvasc  Current BP meds: none  Stable . Permissive hypertension (OK if < 220/120) but gradually normalize in 5-7  days . Long-term BP goal normotensive  Hyperlipidemia  Home Lipid lowering medication: Lipitor 20 mg daily  LDL 77, goal < 70  Current lipid lowering medication: Lipitor 80 mg daily   Continue statin at discharge  Other Stroke Risk Factors  Advanced age  Chews tobacco - advised to stop smoking  Obesity, Body mass index is 32.55 kg/m., recommend weight loss, diet and exercise as appropriate   Other Active Problems  Code status - Full code.  Aortic Atherosclerosis (ICD10-I70.0)  Leukocytosis - 12.6 (afebrile)  CKD - stage 3a - creatinine - 1.35  Hypocalcemia - 7.5  Hospital day # 2  He presented with subacute right PCA infarct likely of cryptogenic etiology.  Recommend further evaluation by checking loop recorder for paroxysmal A. fib.  Start aspirin 81 and  Plavix 75 mg daily for 3 weeks followed by aspirin alone.  Patient advised not to drive till his peripheral vision loss improves.  Aggressive risk factor modification.  Patient counseled to be compliant with using his CPAP. Greater than 50% time during this 25-minute visit was spent on counseling and coordination of care about his embolic stroke and answering questions. Antony Contras, MD To contact Stroke Continuity provider, please refer to [email or web address] After hours, contact General Neurology

## 2019-04-11 NOTE — Consult Note (Signed)
Physical Medicine and Rehabilitation Consult Reason for Consult: Left side weakness and dysarthria Referring Physician: [facility]   HPI: Curtis Jenkins is a 84 y.o. right-handed male with history of hypertension hyperlipidemia as well as report a recent fall several weeks ago with left shoulder pain..  Per chart review patient lives with spouse independent prior to admission.  Patient with recent admission to [facility] 04/04/2019 with weakness numbness in his feet and hands and facial paresthesia.  Cranial CT scan at that time was negative.  He did have some hypocalcemia provided with supplement.  EKG showed some prolonged QT interval felt to be related to electrolyte abnormality and patient was discharged home.  Presented 04/09/2019 with left-sided weakness and slurred speech.  Admission chemistries with glucose 122, creatinine 1.38, calcium 7.3 troponin high-sensitivity 19.  CT/MRI showed large acute to early subacute right PCA infarction.  CT angiogram of head and neck proximal right PCA occlusion.  Widely patent carotid and vertebral arteries.  Echocardiogram with ejection fraction of 45%.  Maintained on aspirin for CVA prophylaxis.  Subcutaneous heparin for DVT prophylaxis.  Dysphagia #2 nectar thick liquids.  Therapy evaluations completed with recommendations of physical medicine rehab consult.  Patient with severe knee osteoarthritis bilaterally with limited ambulation prior to CVA patient complains of a lot of pain in both knees, right greater than the left.   Review of Systems  Constitutional: Negative for chills and fever.  HENT: Negative for hearing loss.   Eyes: Negative for blurred vision and double vision.  Respiratory: Negative for shortness of breath.   Cardiovascular: Negative for chest pain and palpitations.  Gastrointestinal: Positive for constipation. Negative for heartburn, nausea and vomiting.  Genitourinary: Positive for urgency. Negative for dysuria, flank  pain and hematuria.  Musculoskeletal: Positive for myalgias.  Skin: Negative for rash.  All other systems reviewed and are negative.  Past Medical History:  Diagnosis Date  . Arthritis   . Hyperlipidemia   . Hypertension    History reviewed. No pertinent surgical history. History reviewed. No pertinent family history. Social History:  reports that he has never smoked. His smokeless tobacco use includes chew. He reports that he does not drink alcohol. No history on file for drug. Allergies: No Known Allergies Medications Prior to Admission  Medication Sig Dispense Refill  . amLODipine (NORVASC) 5 MG tablet Take 5 mg by mouth daily.    . calcium-vitamin D (OSCAL WITH D) 500-200 MG-UNIT tablet Take 1 tablet by mouth 2 (two) times daily. 60 tablet 2  . traMADol (ULTRAM) 50 MG tablet Take 50 mg by mouth 3 (three) times daily as needed.    Marland Kitchen atorvastatin (LIPITOR) 20 MG tablet Take 20 mg by mouth at bedtime.    . Vitamin D, Ergocalciferol, (DRISDOL) 1.25 MG (50000 UNIT) CAPS capsule Take 1 capsule (50,000 Units total) by mouth every 7 (seven) days. 12 capsule 0  . zolpidem (AMBIEN) 10 MG tablet Take 10 mg by mouth at bedtime as needed.      Home: Home Living Family/patient expects to be discharged to:: Private residence Living Arrangements: Spouse/significant other Available Help at Discharge: Family, Available PRN/intermittently Type of Home: House Home Layout: Multi-level, Able to live on main level with bedroom/bathroom Alternate Level Stairs-Number of Steps: 6 steps going up, 9 steps going down when inside house. 9 steps to enter back door.  Alternate Level Stairs-Rails: Right(going down stairs into basement) Bathroom Shower/Tub: Walk-in shower Home Equipment: Walker - standard, Bedside commode, Grab bars -  tub/shower  Functional History: Prior Function Level of Independence: Independent Comments: Pt ambulates without an assistive device at baseline Functional Status:    Mobility: Bed Mobility Overal bed mobility: Needs Assistance Bed Mobility: Supine to Sit Supine to sit: Max assist, +2 for physical assistance, +2 for safety/equipment General bed mobility comments: Multimodal cueing to initiate; heavy mod assist to help LEs clear EOB; Max assist t oelevate trunk to sit and stabilize Transfers Overall transfer level: Needs assistance Equipment used: 2 person hand held assist(and gait belt/bed pad) Transfers: Sit to/from Stand, Stand Pivot Transfers Sit to Stand: Max assist, +2 physical assistance Stand pivot transfers: Max assist, +2 physical assistance General transfer comment: Multimodal cues to initiate with anterior weight shift; Max assist to stand with close guard/block of L knee      ADL: ADL Overall ADL's : Needs assistance/impaired Eating/Feeding: NPO Grooming: Moderate assistance, Sitting Grooming Details (indicate cue type and reason): While seated in bedside chair. Completes with RUE only at supervision level. When attempting to use left hand pt requires max hand over hand assist. Upper Body Bathing: Moderate assistance, Sitting Lower Body Bathing: Maximal assistance, Sitting/lateral leans Upper Body Dressing : Moderate assistance, Sitting Lower Body Dressing: Total assistance, +2 for physical assistance, Sit to/from stand Toilet Transfer: Maximal assistance, +2 for physical assistance, BSC, Stand-pivot Toileting- Clothing Manipulation and Hygiene: Maximal assistance, +2 for safety/equipment, Sit to/from stand Functional mobility during ADLs: Maximal assistance, +2 for physical assistance(stand pivot to bedside chair) General ADL Comments: Pt tolerated sitting EOB ~5 min with max assist. Pt required max assist x 2 to stand pivot to bedside chair  Cognition: Cognition Overall Cognitive Status: Impaired/Different from baseline Arousal/Alertness: Awake/alert Orientation Level: Oriented X4 Attention: Sustained Sustained Attention:  Impaired Sustained Attention Impairment: Verbal basic, Functional basic Memory: Impaired Memory Impairment: Retrieval deficit, Decreased recall of new information, Decreased short term memory Decreased Short Term Memory: Verbal basic, Functional basic Awareness: Impaired Awareness Impairment: Intellectual impairment, Emergent impairment, Anticipatory impairment Problem Solving: Impaired Problem Solving Impairment: Functional basic, Verbal basic Behaviors: Impulsive Safety/Judgment: Impaired Cognition Arousal/Alertness: Awake/alert Behavior During Therapy: WFL for tasks assessed/performed Overall Cognitive Status: Impaired/Different from baseline Area of Impairment: Orientation, Attention, Safety/judgement, Awareness, Problem solving, Following commands Orientation Level: Disoriented to, Time Current Attention Level: Sustained Following Commands: Follows one step commands with increased time Safety/Judgement: Decreased awareness of safety, Decreased awareness of deficits Awareness: Intellectual Problem Solving: Slow processing, Difficulty sequencing, Requires verbal cues, Requires tactile cues General Comments: Noting L Inattention/neglect; moves RLE when cued to move LLE  Blood pressure (!) 147/79, pulse 69, temperature 98.3 F (36.8 C), temperature source Oral, resp. rate 18, height 6' (1.829 m), weight 108.9 kg, SpO2 97 %. Physical Exam  Nursing note and vitals reviewed. Constitutional: He is oriented to person, place, and time. He appears well-developed and well-nourished.  HENT:  Head: Normocephalic and atraumatic.  Eyes: Pupils are equal, round, and reactive to light. Conjunctivae and EOM are normal.  Neurological: He is alert and oriented to person, place, and time.  Patient is alert in no acute distress.  Made eye contact with examiner.  His speech was a bit dysarthric but intelligible.  Provides his name and age.  Psychiatric: He has a normal mood and affect.  Motor  strength is 5/5 in the right deltoid bicep tricep grip 3 - at the left t deltoid 2 minus at the elbow, 3 - at the left grip Lower extremity trace bilateral hip flexors and knee extensors limited by pain ankle dorsiflexors to  minus Musculoskeletal he has flexion contractures bilateral knees and also lacks full extension due to pain plus minus bony abnormalities.  He does have enlarged joints with some varus deformity Left elbow limited range of motion can flex around 20 degrees and extend around 10 degrees from a 90 degree positioning.  He has swelling on the lateral and posterior aspects of the left  of the left elbow  No results found for this or any previous visit (from the past 24 hour(s)). CT ANGIO HEAD W OR WO CONTRAST  Result Date: 04/09/2019 CLINICAL DATA:  Stroke follow-up. Large right PCA infarct. EXAM: CT ANGIOGRAPHY HEAD AND NECK TECHNIQUE: Multidetector CT imaging of the head and neck was performed using the standard protocol during bolus administration of intravenous contrast. Multiplanar CT image reconstructions and MIPs were obtained to evaluate the vascular anatomy. Carotid stenosis measurements (when applicable) are obtained utilizing NASCET criteria, using the distal internal carotid diameter as the denominator. CONTRAST:  31mL OMNIPAQUE IOHEXOL 350 MG/ML SOLN COMPARISON:  None. FINDINGS: CTA NECK FINDINGS Aortic arch: Standard 3 vessel aortic arch with mild atherosclerotic plaque. No arch vessel origin stenosis. Right carotid system: Patent with small volume soft plaque at the carotid bifurcation. No evidence of stenosis or dissection. Left carotid system: Patent with small volume soft plaque at the common carotid artery origin and carotid bifurcation. No evidence of stenosis or dissection. Tortuous mid cervical ICA. Vertebral arteries: Patent and codominant without evidence of significant stenosis or dissection. Skeleton: Advanced C1-2 arthropathy with multiple erosions in the dens.  Advanced spondylosis with bulky anterior vertebral ossification extending from C2 into the included thoracic spine. Severe facet arthrosis at C7-T1 with grade 1 anterolisthesis. Severe multilevel neural foraminal stenosis. Other neck: No evidence of cervical lymphadenopathy or mass. Nasogastric tube forms a loop in the oropharynx. Upper chest: Clear lung apices. Review of the MIP images confirms the above findings CTA HEAD FINDINGS Anterior circulation: The internal carotid arteries are patent from skull base to carotid termini with mild nonstenotic plaque bilaterally. ACAs and MCAs are patent without evidence of proximal branch occlusion or significant proximal stenosis. The left ACA is dominant. No aneurysm is identified. Posterior circulation: The intracranial vertebral arteries are patent to the basilar with atherosclerotic irregularity bilaterally but no significant stenosis. Patent left PICA, right AICA, and bilateral SCAs are visualized. The basilar artery is widely patent. There is occlusion of the right PCA at the P1 level. The left PCA is patent. No aneurysm is identified. Venous sinuses: As permitted by contrast timing, patent. Anatomic variants: None of significance. Review of the MIP images confirms the above findings IMPRESSION: 1. Proximal right PCA occlusion. 2. Widely patent carotid and vertebral arteries. 3. Aortic Atherosclerosis (ICD10-I70.0). Electronically Signed   By: Logan Bores M.D.   On: 04/09/2019 21:47   DG Abd 1 View  Result Date: 04/09/2019 CLINICAL DATA:  NG tube placement EXAM: ABDOMEN - 1 VIEW COMPARISON:  April 09, 2019 FINDINGS: The NG tube again projects over the left upper quadrant with the side hole near the GE junction. The bowel gas pattern is nonobstructive and nonspecific. There are degenerative changes of the lumbar spine. IMPRESSION: NG tube is unchanged in positioning. If possible, further advancement into the stomach is recommended. Electronically Signed   By:  Constance Holster M.D.   On: 04/09/2019 19:11   CT Head Wo Contrast  Result Date: 04/09/2019 CLINICAL DATA:  Altered mental status with left-sided weakness over 24 hours. EXAM: CT HEAD WITHOUT CONTRAST TECHNIQUE: Contiguous axial  images were obtained from the base of the skull through the vertex without intravenous contrast. COMPARISON:  CT head 04/03/2019 FINDINGS: Brain: New area of hypodensity in the right occipital lobe compatible with acute to subacute infarct. This was not seen previously. No associated hemorrhage. Mild atrophy. Mild chronic microvascular ischemic changes in the white matter. Vascular: Negative for hyperdense vessel Skull: The negative Sinuses/Orbits: Paranasal sinuses clear.  Bilateral ocular surgery. Other: None IMPRESSION: New area of hypodensity right occipital lobe compatible with acute/subacute PCA infarct. No associated hemorrhage Mild atrophy and mild chronic microvascular ischemia. These results were called by telephone at the time of interpretation on 04/09/2019 at 9:12 am to provider Sabra Heck, who verbally acknowledged these results. Electronically Signed   By: Franchot Gallo M.D.   On: 04/09/2019 09:13   CT ANGIO NECK W OR WO CONTRAST  Result Date: 04/09/2019 CLINICAL DATA:  Stroke follow-up. Large right PCA infarct. EXAM: CT ANGIOGRAPHY HEAD AND NECK TECHNIQUE: Multidetector CT imaging of the head and neck was performed using the standard protocol during bolus administration of intravenous contrast. Multiplanar CT image reconstructions and MIPs were obtained to evaluate the vascular anatomy. Carotid stenosis measurements (when applicable) are obtained utilizing NASCET criteria, using the distal internal carotid diameter as the denominator. CONTRAST:  42mL OMNIPAQUE IOHEXOL 350 MG/ML SOLN COMPARISON:  None. FINDINGS: CTA NECK FINDINGS Aortic arch: Standard 3 vessel aortic arch with mild atherosclerotic plaque. No arch vessel origin stenosis. Right carotid system: Patent with  small volume soft plaque at the carotid bifurcation. No evidence of stenosis or dissection. Left carotid system: Patent with small volume soft plaque at the common carotid artery origin and carotid bifurcation. No evidence of stenosis or dissection. Tortuous mid cervical ICA. Vertebral arteries: Patent and codominant without evidence of significant stenosis or dissection. Skeleton: Advanced C1-2 arthropathy with multiple erosions in the dens. Advanced spondylosis with bulky anterior vertebral ossification extending from C2 into the included thoracic spine. Severe facet arthrosis at C7-T1 with grade 1 anterolisthesis. Severe multilevel neural foraminal stenosis. Other neck: No evidence of cervical lymphadenopathy or mass. Nasogastric tube forms a loop in the oropharynx. Upper chest: Clear lung apices. Review of the MIP images confirms the above findings CTA HEAD FINDINGS Anterior circulation: The internal carotid arteries are patent from skull base to carotid termini with mild nonstenotic plaque bilaterally. ACAs and MCAs are patent without evidence of proximal branch occlusion or significant proximal stenosis. The left ACA is dominant. No aneurysm is identified. Posterior circulation: The intracranial vertebral arteries are patent to the basilar with atherosclerotic irregularity bilaterally but no significant stenosis. Patent left PICA, right AICA, and bilateral SCAs are visualized. The basilar artery is widely patent. There is occlusion of the right PCA at the P1 level. The left PCA is patent. No aneurysm is identified. Venous sinuses: As permitted by contrast timing, patent. Anatomic variants: None of significance. Review of the MIP images confirms the above findings IMPRESSION: 1. Proximal right PCA occlusion. 2. Widely patent carotid and vertebral arteries. 3. Aortic Atherosclerosis (ICD10-I70.0). Electronically Signed   By: Logan Bores M.D.   On: 04/09/2019 21:47   MR BRAIN WO CONTRAST  Result Date:  04/09/2019 CLINICAL DATA:  Right occipital infarct. EXAM: MRI HEAD WITHOUT CONTRAST TECHNIQUE: Multiplanar, multiecho pulse sequences of the brain and surrounding structures were obtained without intravenous contrast. COMPARISON:  Head CT 04/09/2019 FINDINGS: Multiple sequences are up to moderately motion degraded. Brain: As seen on today's earlier CT, there is a large acute to early subacute right PCA  infarct involving much of the right occipital lobe and portions of the posteromedial right temporal lobe and thalamus. There is cytotoxic edema with regional sulcal effacement and mild mass effect on the right lateral ventricle. No midline shift, intracranial hemorrhage, or extra-axial fluid collection is identified. Patchy T2 hyperintensities elsewhere in the cerebral white matter bilaterally are nonspecific but compatible with moderate chronic small vessel ischemic disease. Mild cerebral atrophy is within normal limits for age. Vascular: Major intracranial vascular flow voids are preserved. Skull and upper cervical spine: No suspicious marrow lesion. Cervical spondylosis. Sinuses/Orbits: Right cataract extraction. Paranasal sinuses and mastoid air cells are clear. Other: None. IMPRESSION: 1. Large acute to early subacute right PCA infarct. 2. Moderate chronic small vessel ischemic disease. Electronically Signed   By: Logan Bores M.D.   On: 04/09/2019 17:50   DG Chest Portable 1 View  Result Date: 04/09/2019 CLINICAL DATA:  NG tube placement. EXAM: PORTABLE CHEST 1 VIEW COMPARISON:  April 09, 2019 FINDINGS: The heart size is mildly enlarged. The enteric tube tip projects over the gastric body. There is no pneumothorax. No large pleural effusion. No evidence for focal infiltrate. IMPRESSION: No significant interval change in positioning of the NG tube. Consider further advancement with repeat radiograph to follow. Electronically Signed   By: Constance Holster M.D.   On: 04/09/2019 15:34   DG Chest Portable 1  View  Result Date: 04/09/2019 CLINICAL DATA:  Nasogastric tube placement EXAM: PORTABLE CHEST 1 VIEW COMPARISON:  April 03, 2019 FINDINGS: Feeding tube is identified with distal tip probably at the GE junction. Advancement is recommended. The mediastinal contour and cardiac silhouette are stable. The lungs are clear. IMPRESSION: Feeding tube is identified with distal tip probably at the GE junction. Advancement is recommended. Electronically Signed   By: Abelardo Diesel M.D.   On: 04/09/2019 13:44   ECHOCARDIOGRAM COMPLETE  Result Date: 04/10/2019    ECHOCARDIOGRAM REPORT   Patient Name:   TRELL SECRIST Date of Exam: 04/10/2019 Medical Rec #:  [id number]        Height:       72.0 in Accession #:    [id number]       Weight:       240.0 lb Date of Birth:  March 21, 1935         BSA:          2.302 m Patient Age:    74 years         BP:           152/68 mmHg Patient Gender: M                HR:           68 bpm. Exam Location:  Inpatient Procedure: 2D Echo Indications:    stroke  History:        Patient has no prior history of Echocardiogram examinations.                 Risk Factors:Dyslipidemia and Hypertension.  Sonographer:    Johny Chess Referring Phys: [id number] COURAGE EMOKPAE  Sonographer Comments: Image acquisition challenging due to uncooperative patient. IMPRESSIONS  1. Left ventricular ejection fraction, by estimation, is 40 to 45%. The left ventricle has mildly decreased function. The left ventricle demonstrates global hypokinesis. The left ventricular internal cavity size was mildly dilated. Left ventricular diastolic parameters are consistent with Grade I diastolic dysfunction (impaired relaxation).  2. Right ventricular systolic function is normal. The right ventricular size is normal.  3. The mitral valve is grossly normal. Trivial mitral valve regurgitation. No evidence of mitral stenosis.  4. The aortic valve is normal in structure. Aortic valve regurgitation is not visualized. No aortic stenosis  is present. FINDINGS  Left Ventricle: Left ventricular ejection fraction, by estimation, is 40 to 45%. The left ventricle has mildly decreased function. The left ventricle demonstrates global hypokinesis. The left ventricular internal cavity size was mildly dilated. There is  no left ventricular hypertrophy. Left ventricular diastolic parameters are consistent with Grade I diastolic dysfunction (impaired relaxation). Right Ventricle: The right ventricular size is normal. No increase in right ventricular wall thickness. Right ventricular systolic function is normal. Left Atrium: Left atrial size was normal in size. Right Atrium: Right atrial size was normal in size. Pericardium: There is no evidence of pericardial effusion. Mitral Valve: The mitral valve is grossly normal. Trivial mitral valve regurgitation. No evidence of mitral valve stenosis. Tricuspid Valve: The tricuspid valve is grossly normal. Tricuspid valve regurgitation is not demonstrated. No evidence of tricuspid stenosis. Aortic Valve: The aortic valve is normal in structure. Aortic valve regurgitation is not visualized. No aortic stenosis is present. Pulmonic Valve: The pulmonic valve was grossly normal. Pulmonic valve regurgitation is not visualized. No evidence of pulmonic stenosis. Aorta: The aortic root and ascending aorta are structurally normal, with no evidence of dilitation. IAS/Shunts: The atrial septum is grossly normal.  LEFT VENTRICLE PLAX 2D LVIDd:         5.40 cm      Diastology LVIDs:         3.90 cm      LV e' lateral:   6.42 cm/s LV PW:         0.90 cm      LV E/e' lateral: 10.0 LV IVS:        1.00 cm      LV e' medial:    4.68 cm/s LVOT diam:     2.20 cm      LV E/e' medial:  13.7 LV SV:         67 LV SV Index:   29 LVOT Area:     3.80 cm  LV Volumes (MOD) LV vol d, MOD A2C: 129.0 ml LV vol d, MOD A4C: 122.0 ml LV vol s, MOD A2C: 69.2 ml LV vol s, MOD A4C: 73.6 ml LV SV MOD A2C:     59.8 ml LV SV MOD A4C:     122.0 ml LV SV MOD BP:       55.7 ml RIGHT VENTRICLE RV S prime:     16.20 cm/s TAPSE (M-mode): 2.2 cm LEFT ATRIUM             Index       RIGHT ATRIUM           Index LA diam:        3.10 cm 1.35 cm/m  RA Area:     14.20 cm LA Vol (A2C):   73.6 ml 31.97 ml/m RA Volume:   32.30 ml  14.03 ml/m LA Vol (A4C):   60.8 ml 26.41 ml/m LA Biplane Vol: 70.2 ml 30.49 ml/m  AORTIC VALVE LVOT Vmax:   92.70 cm/s LVOT Vmean:  60.200 cm/s LVOT VTI:    0.176 m MITRAL VALVE MV Area (PHT): 2.66 cm    SHUNTS MV Decel Time: 285 msec    Systemic VTI:  0.18 m MV E velocity: 64.30 cm/s  Systemic Diam: 2.20 cm MV A velocity: 87.40 cm/s  MV E/A ratio:  0.74 Mertie Moores MD Electronically signed by Mertie Moores MD Signature Date/Time: 04/10/2019/3:12:04 PM    Final      Assessment/Plan: Diagnosis: Right PCA infarct with left homonymous hemianopsia and left hemiparesis. 1. Does the need for close, 24 hr/day medical supervision in concert with the patient's rehab needs make it unreasonable for this patient to be served in a less intensive setting? Potentially 2. Co-Morbidities requiring supervision/potential complications: Severe osteoarthritis of the knees, left elbow pain and swelling post fall 3. Due to bladder management, bowel management, safety, skin/wound care, disease management, medication administration, pain management and patient education, does the patient require 24 hr/day rehab nursing? Potentially 4. Does the patient require coordinated care of a physician, rehab nurse, therapy disciplines of PT, OT, speech to address physical and functional deficits in the context of the above medical diagnosis(es)? Potentially Addressing deficits in the following areas: balance, endurance, locomotion, strength, transferring, bowel/bladder control, bathing, dressing, toileting, swallowing and psychosocial support 5. Can the patient actively participate in an intensive therapy program of at least 3 hrs of therapy per day at least 5 days per week?  No 6. The potential for patient to make measurable gains while on inpatient rehab is poor 7. Anticipated functional outcomes upon discharge from inpatient rehab are n/a  with PT, n/a with OT, n/a with SLP. 8. Estimated rehab length of stay to reach the above functional goals is: NA 9. Anticipated discharge destination: SNF 10. Overall Rehab/Functional Prognosis: poor  RECOMMENDATIONS: This patient's condition is appropriate for continued rehabilitative care in the following setting: SNF Patient has agreed to participate in recommended program. N/A Note that insurance prior authorization may be required for reimbursement for recommended care.  Comment: Severe bilateral knee pain this is most likely due to his severe osteoarthritis of the knees however he did have a recent fall and x-rays may be helpful.  Also does have left elbow pain and swelling, would recommend left elbow x-rays.  Do not think patient can tolerate intensive rehab due to his chronic knee contractures, as well as his left elbow pain and left hemiparesis from his stroke  Cathlyn Parsons, PA-C 04/11/2019   "I have personally performed a face to face diagnostic evaluation of this patient.  Additionally, I have reviewed and concur with the physician assistant's documentation above." Charlett Blake M.D. [facility] FAAPM&R (Neuromuscular Med) Diplomate Am Board of Electrodiagnostic Med Fellow Am Board of Interventional Pain

## 2019-04-11 NOTE — Progress Notes (Signed)
  Speech Language Pathology Treatment: Dysphagia;Cognitive-Linquistic  Patient Details Name: Curtis Jenkins MRN: [id number] DOB: 08-25-1935 Today's Date: 04/11/2019 Time: 2094-7096 SLP Time Calculation (min) (ACUTE ONLY): 15 min  Assessment / Plan / Recommendation Clinical Impression  Pt was seen in bed today with daughter present. Cognitively he remains distractible, needing Min cues for working memory and sustained attention to verbal sequencing task.  He had declined his breakfast tray, saying he is particular about what he eats, and did not want to sit up in bed due to soreness in his back. Reverse trendelenburg was used to raise his [facility] for safety reasons and pt was agreeable to sips of thickened Coke and bites of applesauce only, and even still in small quantities. He had a single throat clear but otherwise no overt signs of difficulty. After initial education about positioning and Min cues for safety awareness, no further cues were needed during PO intake (although it should be noted that his daughter was assisting with feeding and therefore pacing). Will continue current diet for now but will try to provide skilled observation with more POs on next visit to see if he can advance or if instrumental testing is needed.    HPI HPI: Pt is an 84 yo male who presented to [facility] for worsening L sided weakness and speech/swallowing problems. He was transferred to [facility] for further management. MRI shows a large R PCA infarct. PMH includes: HTN, HLD, and per esophagram in 2002 [facility] and schiatzki's ring      SLP Plan  Continue with current plan of care       Recommendations  Diet recommendations: Dysphagia 2 (fine chop);Nectar-thick liquid Liquids provided via: Cup;Straw Medication Administration: Whole meds with puree Supervision: Staff to assist with self feeding;Full supervision/cueing for compensatory strategies Compensations: Slow rate;Small sips/bites;Minimize environmental distractions Postural  Changes and/or Swallow Maneuvers: Seated upright 90 degrees;Upright 30-60 min after meal                Oral Care Recommendations: Oral care BID Follow up Recommendations: Inpatient Rehab SLP Visit Diagnosis: Cognitive communication deficit (R41.841);Dysphagia, unspecified (R13.10) Plan: Continue with current plan of care       GO                 Osie Bond., M.A. [facility] Pager [phone] Office [phone]  04/11/2019, 9:50 AM

## 2019-04-12 ENCOUNTER — Encounter (HOSPITAL_COMMUNITY)
Admission: EM | Disposition: A | Discharge status: Home Health | Payer: Self-pay | Source: Home / Self Care | Attending: Internal Medicine

## 2019-04-12 ENCOUNTER — Encounter: Payer: Self-pay | Admitting: Cardiology

## 2019-04-12 DIAGNOSIS — G8929 Other chronic pain: Secondary | ICD-10-CM | POA: Diagnosis present

## 2019-04-12 DIAGNOSIS — I6389 Other cerebral infarction: Secondary | ICD-10-CM

## 2019-04-12 DIAGNOSIS — M25422 Effusion, left elbow: Secondary | ICD-10-CM | POA: Diagnosis present

## 2019-04-12 HISTORY — PX: LOOP RECORDER INSERTION: EP1214

## 2019-04-12 LAB — SYNOVIAL CELL COUNT + DIFF, W/ CRYSTALS
Crystals, Fluid: NONE SEEN
Eosinophils-Synovial: 0 % (ref 0–1)
Lymphocytes-Synovial Fld: 0 % (ref 0–20)
Monocyte-Macrophage-Synovial Fluid: 8 % — ABNORMAL LOW (ref 50–90)
Neutrophil, Synovial: 92 % — ABNORMAL HIGH (ref 0–25)
WBC, Synovial: 20625 /mm3 — ABNORMAL HIGH (ref 0–200)

## 2019-04-12 SURGERY — LOOP RECORDER INSERTION

## 2019-04-12 MED ORDER — METHYLPREDNISOLONE ACETATE 40 MG/ML IJ SUSP
40.0000 mg | Freq: Once | INTRAMUSCULAR | Status: AC
  Administered 2019-04-12: 40 mg via INTRA_ARTICULAR
  Filled 2019-04-12: qty 1

## 2019-04-12 MED ORDER — ACETAMINOPHEN 325 MG PO TABS: 325.0000 mg | ORAL_TABLET | ORAL | Status: DC | PRN

## 2019-04-12 MED ORDER — LIDOCAINE-EPINEPHRINE 1 %-1:100000 IJ SOLN
INTRAMUSCULAR | Status: AC
  Filled 2019-04-12: qty 1

## 2019-04-12 MED ORDER — BUPIVACAINE HCL (PF) 0.5 % IJ SOLN
20.0000 mL | Freq: Once | INTRAMUSCULAR | Status: AC
  Administered 2019-04-12: 20 mL
  Filled 2019-04-12: qty 20

## 2019-04-12 SURGICAL SUPPLY — 2 items
MONITOR REVEAL LINQ II (Prosthesis & Implant Heart) ×1 IMPLANT
PACK LOOP INSERTION (CUSTOM PROCEDURE TRAY) ×2 IMPLANT

## 2019-04-12 NOTE — Procedures (Signed)
Procedure: Right knee aspiration and injection and left shoulder injection  Indication: Right knee effusion(s) and left shoulder pain  Surgeon: Silvestre Gunner, PA-C  Assist: None  Anesthesia: Topical refrigerant  EBL: None  Complications: None  Findings: After risks/benefits explained patient desires to undergo procedure. Consent obtained and time out performed. The right knee was sterilely prepped and aspirated. 33ml clear yellow fluid obtained. 101ml 0.5% Marcaine and 40mg  depomedrol instilled. Pt repositioned and left shoulder was sterilely prepped and entered. Position confirmed by aspiration of synovial fluid. 3ml 0.5% Marcaine and 40mg  depomedrol instilled. Pt tolerated both procedures well.    Lisette Abu, PA-C Orthopedic Surgery [phone]

## 2019-04-12 NOTE — Progress Notes (Addendum)
Hospital bed:  Length of Need Lifetime   The above medical condition requires: Patient requires the ability to reposition frequently   Head must be elevated greater than: 30 degrees   Bed type Semi-electric   Hoyer Lift Yes   Support Surface: Low Air loss Mattress    Patient spends 80% of the time in bed. Problems with dysphagia causes frequent choking. Torso is required to be elevated at least 30 degrees to prevent aspiration. Bed wedges do not provide the adequate elevation to resolve the issues with aspiration. Choking requires frequent and immediate changes in body position which cannot be achieved with a normal bed.   Wheelchair:   Patient suffers from chronic Full-thickness retracted supraspinatus tendon tear which impairs their ability to perform daily activities in the home. A walking aid will not resolve  issue with performing activities of daily living. A wheelchair will allow patient to safely perform daily activities. Patient is not able to propel themselves in the home using a standard weight wheelchair due to weakness. Patient can self propel in the lightweight wheelchair. Length of need indefinite .  Accessories: elevating leg rests (ELRs), wheel locks, extensions and anti-tippers.

## 2019-04-12 NOTE — Progress Notes (Addendum)
PROGRESS NOTE    Curtis Jenkins  MWN:[id number] DOB: [birth year] DOA: 04/09/2019 PCP: Redmond School, MD  Brief Narrative:  84 year old gentleman with prior history of hypertension, hyperlipidemia presented to ED at [facility] for left-sided PE weakness, dysphagia and speech abnormalities going on for about a week.  He was transferred to [facility] for further evaluation of stroke and MRI of the brain.  Neurology was consulted recommended 3 weeks of aspirin and plavix followed by aspirin alone. Pt appears very deconditioned and PT evaluation recommended cir/ SNF, family wants to take the patient home with home health PT, OT. As per the daughter at bedside, pt had fallen about 4 to 6 weeks ago on to the left side and currently has pain in the left elbow and left shoulder. X rays of the elbow and CT of the shoulder revealed effusion of the left elbow and Full-thickness retracted supraspinatus tendon tear . Orthopedics consulted for further recommendations to see if he needs intra articular steroid injection and /or aspiration of the left knee.    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 No associated hemorrhage.  MRI of the brain without contrast shows Large acute to early subacute right PCA infarct. Moderate chronic small vessel ischemic disease.  CT angiogram of the head and neck showed Proximal right PCA occlusion.  Widely patent carotid and vertebral arteries.  LDL IS 77, hemoglobin A1c is 6.3, patient was started on Lipitor 80 mg daily. Therapy evaluations  recommending CIR, but pt is deconditioned and overall rehab prognosis is poor hence plan for SNF on discharge vs home health PT/OT, . Family is inclining towards home health.  Neurology on board, recommending aspirin and plavid for 3 weeks followed by aspirin alone.  Echocardiogram ordered showed Left ventricular ejection fraction, by estimation, is 40 to 45%. The left ventricle has mildly decreased function. The left ventricle demonstrates global hypokinesis. The left ventricular internal cavity size  was mildly dilated. Left ventricular diastolic parameters are consistent with Grade I diastolic dysfunction  (impaired relaxation). Started the patient on aspirin 81 mg and Plavix 75 mg, plan to continue both for 3 weeks followed by aspirin alone.  Patient is advised not to drive till his peripheral vision improves. EP consult loop recorder placement today  for evaluation of paroxysmal atrial fibrillation.    Essential hypertension Well controlled. Resume home meds.   Stage II CKD Creatinine appears to be at baseline at 1.3.   Hyperlipidemia Patient is currently on Lipitor 80 mg daily.  Dysphagia and speech abnormality SLP eval/cognitive language evaluation ordered. SLP eval recommending dysphagia 2 diet with nectar thick liquids.   History of chronic back pain as per history from the daughter at bedside Patient is on tramadol.  Patient had recent falls on to  the left side and currently is unable to move left arm. X-rays of bilateral knees , x rays of the elbow and CT of the shoulder ordered for further evaluation. CT of the shoulder Moderate glenohumeral and AC joint degenerative changes. Full-thickness retracted supraspinatus tendon tear, likely chronic.  x rays of the left  elbow Suspected elbow effusion with possible calcified loose body anteriorly. Prominent enthesophytes at the elbow joint raise possibility of idiopathic skeletal hyperostosis. X rays of the left knee Advanced  arthritis of the knee. Suprapatellar joint effusion with multiple calcified loose bodies Pain control and Physical Therapy.  Orthopedics consulted,  Plan for aspiration right knee and then inject same and left shoulder.   DVT prophylaxis: Heparin Code Status: Full code  family Communication: None at bedside, daughter at bedside.  Disposition Plan:  . Patient came from: Home           . Anticipated d/c place: Home health PT/OT.  Marland Kitchen Barriers to d/c OR conditions which need to be met to effect a safe d/c: TOC on board for DME and home health. Pending ortho work up.    Consultants:   Neurology  Orthopedics .  Procedures: MRI of the brain Echocardiogram.   Antimicrobials: None   Subjective: Pt reports persistent pain int he left elbow. No dizziness or chest pain.   Objective: Vitals:   04/11/19 1948 04/11/19 2355 04/12/19 0327 04/12/19 0742  BP: (!) 140/57 122/73 135/77 140/77  Pulse: (!) 58 67 71 (!) 56  Resp: 18 18 18 16   Temp: 98.7 F (37.1 C) 98.5 F (36.9 C) 98.3 F (36.8 C) 98.5 F (36.9 C)  TempSrc: Oral Oral Oral Oral  SpO2: 95% 96% 98% 99%  Weight:      Height:        Intake/Output Summary (Last 24 hours) at 04/12/2019 1123 Last data filed at 04/12/2019 0700 Gross per 24 hour  Intake --  Output 400 ml  Net -400 ml   Filed Weights   04/09/19 0330  Weight: 108.9 kg    Examination:  General exam: alert and comfortable, no distress noted Respiratory system: clear to auscultation, no wheezing or rhonchi.  Cardiovascular system: S1S2 heard, RRR, no JVD,  Gastrointestinal system: abdomen is soft, non tender non distended bowel sounds wnl.  Central nervous system: alert, answering questions appropriately, following commands.  Extremities: left elbow joint swollen, unable to move the left shoulder, left knee swollen and tender.  Skin: no ulcers seen.  Psychiatry: mood is appropriate.    Data Reviewed: I have personally reviewed following labs and imaging  studies  CBC: Recent Labs  Lab 04/09/19 0401 04/10/19 0320  WBC 10.5 12.6*  NEUTROABS 6.7  --   HGB 14.5 15.4  HCT 43.4 45.8  MCV 92.7 92.5  PLT 245 240   Basic Metabolic Panel: Recent Labs  Lab 04/09/19 0401 04/10/19 0320  NA 135 140  K 3.6 3.6  CL 101 103  CO2 25 22  GLUCOSE 122* 121*  BUN 22 21  CREATININE 1.38* 1.35*  CALCIUM 7.3* 7.5*  PHOS  --  3.3   GFR: Estimated Creatinine Clearance: 53.8 mL/min (A) (by C-G formula based on SCr of 1.35 mg/dL (H)). Liver Function Tests: Recent Labs  Lab 04/09/19 0401 04/10/19 0320  AST 24  --   ALT 18  --   ALKPHOS 99  --   BILITOT 1.2  --   PROT 7.3  --   ALBUMIN 4.0 3.6   Recent Labs  Lab 04/09/19 0401  LIPASE 25   No results for input(s): AMMONIA in the last 168 hours. Coagulation Profile: Recent Labs  Lab 04/09/19 0401  INR 1.2   Cardiac Enzymes: No results for input(s): CKTOTAL, CKMB, CKMBINDEX, TROPONINI in the last 168 hours. BNP (last 3 results) No results for input(s): PROBNP  in the last 8760 hours. HbA1C: No results for input(s): HGBA1C in the last 72 hours. CBG: Recent Labs  Lab 04/09/19 1530  GLUCAP 104*   Lipid Profile: No results for input(s): CHOL, HDL, LDLCALC, TRIG, CHOLHDL, LDLDIRECT in the last 72 hours. Thyroid Function Tests: No results for input(s): TSH, T4TOTAL, FREET4, T3FREE, THYROIDAB in the last 72 hours. Anemia Panel: No results for input(s): VITAMINB12, FOLATE, FERRITIN, TIBC, IRON, RETICCTPCT in the last 72 hours. Sepsis Labs: No results for input(s): PROCALCITON, LATICACIDVEN in the last 168 hours.  Recent Results (from the past 240 hour(s))  SARS CORONAVIRUS 2 (TAT 6-24 HRS) Nasopharyngeal Nasopharyngeal Swab     Status: None   Collection Time: 04/03/19 11:42 AM   Specimen: Nasopharyngeal Swab  Result Value Ref Range Status   SARS Coronavirus 2 NEGATIVE NEGATIVE Final    Comment: (NOTE) SARS-CoV-2 target nucleic acids are NOT DETECTED. The SARS-CoV-2 RNA is  generally detectable in upper and lower respiratory specimens during the acute phase of infection. Negative results do not preclude SARS-CoV-2 infection, do not rule out co-infections with other pathogens, and should not be used as the sole basis for treatment or other patient management decisions. Negative results must be combined with clinical observations, patient history, and epidemiological information. The expected result is Negative. Fact Sheet for Patients: SugarRoll.be Fact Sheet for Healthcare Providers: https://www.woods-mathews.com/ This test is not yet approved or cleared by the [place] FDA and  has been authorized for detection and/or diagnosis of SARS-CoV-2 by FDA under an Emergency Use Authorization (EUA). This EUA will remain  in effect (meaning this test can be used) for the duration of the COVID-19 declaration under Section 56 4(b)(1) of the Act, 21 U.S.C. section 360bbb-3(b)(1), unless the authorization is terminated or revoked sooner. Performed at [facility], [place] [address] [place], [place] [address]   SARS CORONAVIRUS 2 (TAT 6-24 HRS) Nasopharyngeal Nasopharyngeal Swab     Status: None   Collection Time: 04/09/19  7:25 AM   Specimen: Nasopharyngeal Swab  Result Value Ref Range Status   SARS Coronavirus 2 NEGATIVE NEGATIVE Final    Comment: (NOTE) SARS-CoV-2 target nucleic acids are NOT DETECTED. The SARS-CoV-2 RNA is generally detectable in upper and lower respiratory specimens during the acute phase of infection. Negative results do not preclude SARS-CoV-2 infection, do not rule out co-infections with other pathogens, and should not be used as the sole basis for treatment or other patient management decisions. Negative results must be combined with clinical observations, patient history, and epidemiological information. The expected result is Negative. Fact Sheet for  Patients: SugarRoll.be Fact Sheet for Healthcare Providers: https://www.woods-mathews.com/ This test is not yet approved or cleared by the [place] FDA and  has been authorized for detection and/or diagnosis of SARS-CoV-2 by FDA under an Emergency Use Authorization (EUA). This EUA will remain  in effect (meaning this test can be used) for the duration of the COVID-19 declaration under Section 56 4(b)(1) of the Act, 21 U.S.C. section 360bbb-3(b)(1), unless the authorization is terminated or revoked sooner. Performed at [facility], [place] [address], [place], [place] [address]          Radiology Studies: DG Elbow 2 Views Left  Result Date: 04/11/2019 CLINICAL DATA:  Pain with limited range of motion EXAM: LEFT ELBOW - 2 VIEW COMPARISON:  None. FINDINGS: No acute displaced fracture or malalignment. Prominent enthesophytes at the olecranon coronoid and trochlea. Prominent osteophytes at the radial head and neck. Suspected elbow effusion.  Possible calcified loose body at the anterior joint space. Bony protuberance at the distal metaphysis of the humerus on the ulnar side, questionable for osteochondroma. IMPRESSION: 1. No acute osseous abnormality. 2. Suspected elbow effusion with possible calcified loose body anteriorly. Prominent enthesophytes at the elbow joint raise possibility of idiopathic skeletal hyperostosis. 3. Bony protuberance on the ulnar side of distal humerus, question osteochondroma. Electronically Signed   By: Donavan Foil M.D.   On: 04/11/2019 16:14   DG Knee 1-2 Views Left  Result Date: 04/11/2019 CLINICAL DATA:  Pain with limited range of motion EXAM: LEFT KNEE - 1-2 VIEW COMPARISON:  None. FINDINGS: No fracture or malalignment. Mild vascular calcifications. Small knee effusion with multiple calcified suprapatellar loose bodies. Punctate opacity overlying the suprapatellar soft tissues may reflect a small foreign body.  Advanced arthritis involving the medial and patellofemoral compartments with joint space narrowing and prominent osteophytosis. Degenerative changes of the lateral joint space with compensatory widening and osteophyte. Mild joint space calcification. IMPRESSION: 1. No acute osseous abnormality. 2. Advanced arthritis of the knee. Suprapatellar joint effusion with multiple calcified loose bodies 3. Possible small foreign body overlying the suprapatellar soft tissues Electronically Signed   By: Donavan Foil M.D.   On: 04/11/2019 16:16   DG Knee 1-2 Views Right  Result Date: 04/11/2019 CLINICAL DATA:  Knee pain EXAM: RIGHT KNEE - 1-2 VIEW COMPARISON:  None. FINDINGS: No fracture or malalignment. Tricompartment arthritis of the knee, severe involving the patellofemoral and medial joint spaces. Mild joint space calcification. Small knee effusion with small calcified suprapatellar loose bodies. Vascular calcifications. IMPRESSION: 1. No acute osseous abnormality. 2. Advanced tricompartment arthritis of the knee. Small suprapatellar effusion with small calcified loose bodies 3. Joint space calcifications as may be seen with chondrocalcinosis. Electronically Signed   By: Donavan Foil M.D.   On: 04/11/2019 16:17   CT SHOULDER LEFT WO CONTRAST  Result Date: 04/12/2019 CLINICAL DATA:  [place]. Left shoulder pain. EXAM: CT OF THE UPPER LEFT EXTREMITY WITHOUT CONTRAST TECHNIQUE: Multidetector CT imaging of the upper left extremity was performed according to the standard protocol. COMPARISON:  None. FINDINGS: Moderate glenohumeral and AC joint degenerative changes. No acute fracture of the humeral head or neck. No dislocation. Marked narrowing of the humeroacromial space most consistent with a full-thickness retracted supraspinatus tendon tear, likely chronic. The visualized left ribs are intact. The visualized left lung is grossly clear. IMPRESSION: 1. No acute fracture or dislocation. 2. Moderate glenohumeral and AC joint  degenerative changes. 3. Full-thickness retracted supraspinatus tendon tear, likely chronic. Electronically Signed   By: Marijo Sanes M.D.   On: 04/12/2019 05:31   ECHOCARDIOGRAM COMPLETE  Result Date: 04/10/2019    ECHOCARDIOGRAM REPORT   Patient Name:   BARTT GONZAGA Date of Exam: 04/10/2019 Medical Rec #:  [id number]        Height:       72.0 in Accession #:    [id number]       Weight:       240.0 lb Date of Birth:  [birth year]         BSA:          2.302 m Patient Age:    10 years         BP:           152/68 mmHg Patient Gender: M                HR:  68 bpm. Exam Location:  Inpatient Procedure: 2D Echo Indications:    stroke  History:        Patient has no prior history of Echocardiogram examinations.                 Risk Factors:Dyslipidemia and Hypertension.  Sonographer:    Johny Chess Referring Phys: [id number] COURAGE EMOKPAE  Sonographer Comments: Image acquisition challenging due to uncooperative patient. IMPRESSIONS  1. Left ventricular ejection fraction, by estimation, is 40 to 45%. The left ventricle has mildly decreased function. The left ventricle demonstrates global hypokinesis. The left ventricular internal cavity size was mildly dilated. Left ventricular diastolic parameters are consistent with Grade I diastolic dysfunction (impaired relaxation).  2. Right ventricular systolic function is normal. The right ventricular size is normal.  3. The mitral valve is grossly normal. Trivial mitral valve regurgitation. No evidence of mitral stenosis.  4. The aortic valve is normal in structure. Aortic valve regurgitation is not visualized. No aortic stenosis is present. FINDINGS  Left Ventricle: Left ventricular ejection fraction, by estimation, is 40 to 45%. The left ventricle has mildly decreased function. The left ventricle demonstrates global hypokinesis. The left ventricular internal cavity size was mildly dilated. There is  no left ventricular hypertrophy. Left ventricular diastolic  parameters are consistent with Grade I diastolic dysfunction (impaired relaxation). Right Ventricle: The right ventricular size is normal. No increase in right ventricular wall thickness. Right ventricular systolic function is normal. Left Atrium: Left atrial size was normal in size. Right Atrium: Right atrial size was normal in size. Pericardium: There is no evidence of pericardial effusion. Mitral Valve: The mitral valve is grossly normal. Trivial mitral valve regurgitation. No evidence of mitral valve stenosis. Tricuspid Valve: The tricuspid valve is grossly normal. Tricuspid valve regurgitation is not demonstrated. No evidence of tricuspid stenosis. Aortic Valve: The aortic valve is normal in structure. Aortic valve regurgitation is not visualized. No aortic stenosis is present. Pulmonic Valve: The pulmonic valve was grossly normal. Pulmonic valve regurgitation is not visualized. No evidence of pulmonic stenosis. Aorta: The aortic root and ascending aorta are structurally normal, with no evidence of dilitation. IAS/Shunts: The atrial septum is grossly normal.  LEFT VENTRICLE PLAX 2D LVIDd:         5.40 cm      Diastology LVIDs:         3.90 cm      LV e' lateral:   6.42 cm/s LV PW:         0.90 cm      LV E/e' lateral: 10.0 LV IVS:        1.00 cm      LV e' medial:    4.68 cm/s LVOT diam:     2.20 cm      LV E/e' medial:  13.7 LV SV:         67 LV SV Index:   29 LVOT Area:     3.80 cm  LV Volumes (MOD) LV vol d, MOD A2C: 129.0 ml LV vol d, MOD A4C: 122.0 ml LV vol s, MOD A2C: 69.2 ml LV vol s, MOD A4C: 73.6 ml LV SV MOD A2C:     59.8 ml LV SV MOD A4C:     122.0 ml LV SV MOD BP:      55.7 ml RIGHT VENTRICLE RV S prime:     16.20 cm/s TAPSE (M-mode): 2.2 cm LEFT ATRIUM             Index  RIGHT ATRIUM           Index LA diam:        3.10 cm 1.35 cm/m  RA Area:     14.20 cm LA Vol (A2C):   73.6 ml 31.97 ml/m RA Volume:   32.30 ml  14.03 ml/m LA Vol (A4C):   60.8 ml 26.41 ml/m LA Biplane Vol: 70.2 ml 30.49  ml/m  AORTIC VALVE LVOT Vmax:   92.70 cm/s LVOT Vmean:  60.200 cm/s LVOT VTI:    0.176 m MITRAL VALVE MV Area (PHT): 2.66 cm    SHUNTS MV Decel Time: 285 msec    Systemic VTI:  0.18 m MV E velocity: 64.30 cm/s  Systemic Diam: 2.20 cm MV A velocity: 87.40 cm/s MV E/A ratio:  0.74 Mertie Moores MD Electronically signed by Mertie Moores MD Signature Date/Time: 04/10/2019/3:12:04 PM    Final         Scheduled Meds: . aspirin EC  81 mg Oral Daily  . atorvastatin  80 mg Oral QHS  . bupivacaine  20 mL Infiltration Once  . calcium-vitamin D  1 tablet Oral BID WC  . clopidogrel  75 mg Oral Daily  . feeding supplement (ENSURE ENLIVE)  237 mL Oral TID PC  . heparin  5,000 Units Subcutaneous Q8H  . methylPREDNISolone acetate  40 mg Intra-articular Once  . methylPREDNISolone acetate  40 mg Intra-articular Once  . sodium chloride flush  3 mL Intravenous Q12H  . Vitamin D (Ergocalciferol)  50,000 Units Oral Q7 days   Continuous Infusions: . sodium chloride    . sodium chloride 75 mL/hr at 04/12/19 0521     LOS: 3 days        Hosie Poisson, MD [facility]   To contact the attending provider between 7A-7P or the covering provider during after hours 7P-7A, please log into the web site [email or web address] and access using universal [place] password for that web site. If you do not have the password, please call the hospital operator.  04/12/2019, 11:23 AM

## 2019-04-12 NOTE — TOC Initial Note (Signed)
Transition of Care [facility]) - Initial/Assessment Note    Patient Details  Name: Curtis Jenkins MRN: [id number] Date of Birth: May 28, 1935  Transition of Care [facility]) CM/SW Contact:    Pollie Friar, RN Phone Number: 04/12/2019, 1:27 PM  Clinical Narrative:                 Family has decided they want to take the patient home with [facility] services when he is medically ready.  CM met with the patient and his daughter at the bedside. Daughter requesting: bed, hoyer and wheelchair for home. She would like to use [place] for the DME. CM has called [employer] and faxed them the DME orders.  Daughter provided choice for [facility] and [facility] selected. Cassie with [facility] accepted the referral.  Will f/u with delivery of DME and hopeful d/c tomorrow.  Expected Discharge Plan: [place] Barriers to Discharge: Continued Medical Work up   Patient Goals and CMS Choice   CMS Medicare.gov Compare Post Acute Care list provided to:: Patient Represenative (must comment) Choice offered to / list presented to : Adult Children  Expected Discharge Plan and Services Expected Discharge Plan: [place]   Discharge Planning Services: CM Consult Post Acute Care Choice: Home Health, Durable Medical Equipment Living arrangements for the past 2 months: [place]                 DME Arranged: Hospital bed, Wheelchair manual, Other see comment(hoyer) DME Agency: [facility] Date DME Agency Contacted: 04/12/19   Representative spoke with at DME Agency: Information faxed to [facility] for them to review.   [place] Agency: [facility] Date HH Agency Contacted: 04/12/19   Representative spoke with at [place] Arrangements/Services Living arrangements for the past 2 months: [place] with:: Adult Children Patient language and need for interpreter reviewed:: Yes Do you feel safe going back to  the place where you live?: Yes      Need for Family Participation in Patient Care: Yes (Comment) Care giver support system in place?: Yes (comment)(family)   Criminal Activity/Legal Involvement Pertinent to Current Situation/Hospitalization: No - Comment as needed  Activities of Daily Living Home Assistive Devices/Equipment: Gilford Rile (specify type) ADL Screening (condition at time of admission) Patient's cognitive ability adequate to safely complete daily activities?: Yes Is the patient deaf or have difficulty hearing?: No Does the patient have difficulty seeing, even when wearing glasses/contacts?: No Does the patient have difficulty concentrating, remembering, or making decisions?: No Patient able to express need for assistance with ADLs?: Yes Does the patient have difficulty dressing or bathing?: No Independently performs ADLs?: Yes (appropriate for developmental age) Does the patient have difficulty walking or climbing stairs?: No Weakness of Legs: Left Weakness of Arms/Hands: Left  Permission Sought/Granted                  Emotional Assessment Appearance:: Appears stated age   Affect (typically observed): Accepting Orientation: : Oriented to Self   Psych Involvement: No (comment)  Admission diagnosis:  Stroke (cerebrum) ([place]) [I63.9] Encounter for imaging study to confirm nasogastric (NG) tube placement [Z01.89] Stroke due to embolism of right middle cerebral artery ([place]) [I63.411] Patient Active Problem List   Diagnosis Date Noted  . Effusion of left elbow 04/12/2019  . Chronic left shoulder pain 04/12/2019  . Rt Occipital Lobe Stroke with Lt hemiparesis 04/09/2019  . Dysphagia following cerebrovascular accident 04/09/2019  .  Speech disturbance due to Acute CVA 04/09/2019  . Tobacco abuse-Chews Tobacco 04/09/2019  . Paresthesia   . Hypocalcemia 04/03/2019  . Prolonged QT interval 04/03/2019  . HTN (hypertension) 04/03/2019  . HLD (hyperlipidemia) 04/03/2019  .  Weakness 04/03/2019   PCP:  Redmond School, MD Pharmacy:   [facility] [address] [place] [address] Phone: [phone] Fax: [phone]     Social Determinants of Health (SDOH) Interventions    Readmission Risk Interventions No flowsheet data found.

## 2019-04-12 NOTE — Consult Note (Addendum)
ELECTROPHYSIOLOGY CONSULT NOTE  Patient ID: Curtis Jenkins MRN: [id number], DOB/AGE: June 20, 1935   Admit date: 04/09/2019 Date of Consult: 04/12/2019  Primary Physician: Redmond School, MD Primary Cardiologist: none Reason for Consultation: Cryptogenic stroke ; recommendations regarding Implantable Loop Recorder, requested by Dr. Leonie Man  History of Present Illness Curtis Jenkins was admitted on 04/09/2019 initially to [facility] with L sided weakness, difficult speech, dysphagia, found with stroke.    PMHx noted for HTN, HLD, arthritis  Neurology notes:  Large acute to early subacute right PCA infarct - embolic - source unknown.  he has undergone workup for stroke including echocardiogram and carotid dopplers.  The patient has been monitored on telemetry which has demonstrated sinus rhythm with no arrhythmias.  Inpatient stroke work-up is to be completed with a TEE.   Echocardiogram this admission demonstrated    IMPRESSIONS  1. Left ventricular ejection fraction, by estimation, is 40 to 45%. The  left ventricle has mildly decreased function. The left ventricle  demonstrates global hypokinesis. The left ventricular internal cavity size  was mildly dilated. Left ventricular  diastolic parameters are consistent with Grade I diastolic dysfunction  (impaired relaxation).  2. Right ventricular systolic function is normal. The right ventricular  size is normal.  3. The mitral valve is grossly normal. Trivial mitral valve  regurgitation. No evidence of mitral stenosis.  4. The aortic valve is normal in structure. Aortic valve regurgitation is  not visualized. No aortic stenosis is present.     Lab work is reviewed WBC 12.6, afebrile    Prior to admission, the patient denies chest pain, shortness of breath, dizziness, palpitations, or syncope.  They are recovering from their stroke with plans to rehab at discharge.   Past Medical History:  Diagnosis Date  . Arthritis   .  Hyperlipidemia   . Hypertension      Surgical History: History reviewed. No pertinent surgical history.   Medications Prior to Admission  Medication Sig Dispense Refill Last Dose  . amLODipine (NORVASC) 5 MG tablet Take 5 mg by mouth daily.   04/08/2019 at Unknown time  . calcium-vitamin D (OSCAL WITH D) 500-200 MG-UNIT tablet Take 1 tablet by mouth 2 (two) times daily. 60 tablet 2 04/08/2019 at Unknown time  . traMADol (ULTRAM) 50 MG tablet Take 50 mg by mouth 3 (three) times daily as needed.   04/08/2019 at Unknown time  . atorvastatin (LIPITOR) 20 MG tablet Take 20 mg by mouth at bedtime.   04/08/2019  . Vitamin D, Ergocalciferol, (DRISDOL) 1.25 MG (50000 UNIT) CAPS capsule Take 1 capsule (50,000 Units total) by mouth every 7 (seven) days. 12 capsule 0 04/04/2019  . zolpidem (AMBIEN) 10 MG tablet Take 10 mg by mouth at bedtime as needed.   04/08/2019    Inpatient Medications:  . aspirin EC  81 mg Oral Daily  . atorvastatin  80 mg Oral QHS  . bupivacaine  20 mL Infiltration Once  . calcium-vitamin D  1 tablet Oral BID WC  . clopidogrel  75 mg Oral Daily  . feeding supplement (ENSURE ENLIVE)  237 mL Oral TID PC  . heparin  5,000 Units Subcutaneous Q8H  . methylPREDNISolone acetate  40 mg Intra-articular Once  . methylPREDNISolone acetate  40 mg Intra-articular Once  . sodium chloride flush  3 mL Intravenous Q12H  . Vitamin D (Ergocalciferol)  50,000 Units Oral Q7 days    Allergies: No Known Allergies  Social History   Socioeconomic History  . Marital status:  Married    Spouse name: Not on file  . Number of children: Not on file  . Years of education: Not on file  . Highest education level: Not on file  Occupational History  . Not on file  Tobacco Use  . Smoking status: Never Smoker  . Smokeless tobacco: Current User    Types: Chew  Substance and Sexual Activity  . Alcohol use: Never  . Drug use: Not on file  . Sexual activity: Not on file  Other Topics Concern  . Not on  file  Social History Narrative  . Not on file   Social Determinants of Health   Financial Resource Strain:   . Difficulty of Paying Living Expenses:   Food Insecurity:   . Worried About Charity fundraiser in the Last Year:   . Arboriculturist in the Last Year:   Transportation Needs:   . Film/video editor (Medical):   Marland Kitchen Lack of Transportation (Non-Medical):   Physical Activity:   . Days of Exercise per Week:   . Minutes of Exercise per Session:   Stress:   . Feeling of Stress :   Social Connections:   . Frequency of Communication with Friends and Family:   . Frequency of Social Gatherings with Friends and Family:   . Attends Religious Services:   . Active Member of Clubs or Organizations:   . Attends Archivist Meetings:   Marland Kitchen Marital Status:   Intimate Partner Violence:   . Fear of Current or Ex-Partner:   . Emotionally Abused:   Marland Kitchen Physically Abused:   . Sexually Abused:      History reviewed. His Mom had bone cancer.   Review of Systems: All other systems reviewed and are otherwise negative except as noted above.  Physical Exam: Vitals:   04/11/19 1948 04/11/19 2355 04/12/19 0327 04/12/19 0742  BP: (!) 140/57 122/73 135/77 140/77  Pulse: (!) 58 67 71 (!) 56  Resp: 18 18 18 16   Temp: 98.7 F (37.1 C) 98.5 F (36.9 C) 98.3 F (36.8 C) 98.5 F (36.9 C)  TempSrc: Oral Oral Oral Oral  SpO2: 95% 96% 98% 99%  Weight:      Height:        GEN- The patient is well appearing, alert and oriented x 3 today.   Head- normocephalic, atraumatic Eyes-  Sclera clear, conjunctiva pink Ears- hearing intact Oropharynx- clear Neck- supple Lungs- CTA b/l, normal work of breathing Heart- RRR, no murmurs, rubs or gallops  GI- soft, NT, ND Extremities- no clubbing, cyanosis, or edema MS- no significant deformity or atrophy Skin- no rash or lesion Psych- euthymic mood, full affect   Labs:   Lab Results  Component Value Date   WBC 12.6 (H) 04/10/2019    HGB 15.4 04/10/2019   HCT 45.8 04/10/2019   MCV 92.5 04/10/2019   PLT 246 04/10/2019    Recent Labs  Lab 04/09/19 0401 04/09/19 0401 04/10/19 0320  NA 135   < > 140  K 3.6   < > 3.6  CL 101   < > 103  CO2 25   < > 22  BUN 22   < > 21  CREATININE 1.38*   < > 1.35*  CALCIUM 7.3*   < > 7.5*  PROT 7.3  --   --   BILITOT 1.2  --   --   ALKPHOS 99  --   --   ALT 18  --   --  AST 24  --   --   GLUCOSE 122*   < > 121*   < > = values in this interval not displayed.   No results found for: CKTOTAL, CKMB, CKMBINDEX, TROPONINI Lab Results  Component Value Date   CHOL 129 04/04/2019   Lab Results  Component Value Date   HDL 36 (L) 04/04/2019   Lab Results  Component Value Date   LDLCALC 77 04/04/2019   Lab Results  Component Value Date   TRIG 79 04/04/2019   Lab Results  Component Value Date   CHOLHDL 3.6 04/04/2019   No results found for: LDLDIRECT  No results found for: DDIMER   Radiology/Studies:   CT ANGIO HEAD W OR WO CONTRAST Result Date: 04/09/2019 CLINICAL DATA:  Stroke follow-up. Large right PCA infarct. EXAM: CT ANGIOGRAPHY HEAD AND NECK TECHNIQUE: Multidetector CT imaging of the head and neck was performed using the standard protocol during bolus administration of intravenous contrast. Multiplanar CT image reconstructions and MIPs were obtained to evaluate the vascular anatomy. Carotid stenosis measurements (when applicable) are obtained utilizing NASCET criteria, using the distal internal carotid diameter as the denominator. CONTRAST:  72mL OMNIPAQUE IOHEXOL 350 MG/ML SOLN COMPARISON:  None. FINDINGS: CTA NECK FINDINGS Aortic arch: Standard 3 vessel aortic arch with mild atherosclerotic plaque. No arch vessel origin stenosis. Right carotid system: Patent with small volume soft plaque at the carotid bifurcation. No evidence of stenosis or dissection. Left carotid system: Patent with small volume soft plaque at the common carotid artery origin and carotid  bifurcation. No evidence of stenosis or dissection. Tortuous mid cervical ICA. Vertebral arteries: Patent and codominant without evidence of significant stenosis or dissection. Skeleton: Advanced C1-2 arthropathy with multiple erosions in the dens. Advanced spondylosis with bulky anterior vertebral ossification extending from C2 into the included thoracic spine. Severe facet arthrosis at C7-T1 with grade 1 anterolisthesis. Severe multilevel neural foraminal stenosis. Other neck: No evidence of cervical lymphadenopathy or mass. Nasogastric tube forms a loop in the oropharynx. Upper chest: Clear lung apices. Review of the MIP images confirms the above findings CTA HEAD FINDINGS Anterior circulation: The internal carotid arteries are patent from skull base to carotid termini with mild nonstenotic plaque bilaterally. ACAs and MCAs are patent without evidence of proximal branch occlusion or significant proximal stenosis. The left ACA is dominant. No aneurysm is identified. Posterior circulation: The intracranial vertebral arteries are patent to the basilar with atherosclerotic irregularity bilaterally but no significant stenosis. Patent left PICA, right AICA, and bilateral SCAs are visualized. The basilar artery is widely patent. There is occlusion of the right PCA at the P1 level. The left PCA is patent. No aneurysm is identified. Venous sinuses: As permitted by contrast timing, patent. Anatomic variants: None of significance. Review of the MIP images confirms the above findings IMPRESSION: 1. Proximal right PCA occlusion. 2. Widely patent carotid and vertebral arteries. 3. Aortic Atherosclerosis (ICD10-I70.0). Electronically Signed   By: Logan Bores M.D.   On: 04/09/2019 21:47    DG Elbow 2 Views Left Result Date: 04/11/2019 CLINICAL DATA:  Pain with limited range of motion EXAM: LEFT ELBOW - 2 VIEW COMPARISON:  None. FINDINGS: No acute displaced fracture or malalignment. Prominent enthesophytes at the olecranon  coronoid and trochlea. Prominent osteophytes at the radial head and neck. Suspected elbow effusion. Possible calcified loose body at the anterior joint space. Bony protuberance at the distal metaphysis of the humerus on the ulnar side, questionable for osteochondroma. IMPRESSION: 1. No acute osseous abnormality. 2.  Suspected elbow effusion with possible calcified loose body anteriorly. Prominent enthesophytes at the elbow joint raise possibility of idiopathic skeletal hyperostosis. 3. Bony protuberance on the ulnar side of distal humerus, question osteochondroma. Electronically Signed   By: Donavan Foil M.D.   On: 04/11/2019 16:14    DG Knee 1-2 Views Left Result Date: 04/11/2019 CLINICAL DATA:  Pain with limited range of motion EXAM: LEFT KNEE - 1-2 VIEW COMPARISON:  None. FINDINGS: No fracture or malalignment. Mild vascular calcifications. Small knee effusion with multiple calcified suprapatellar loose bodies. Punctate opacity overlying the suprapatellar soft tissues may reflect a small foreign body. Advanced arthritis involving the medial and patellofemoral compartments with joint space narrowing and prominent osteophytosis. Degenerative changes of the lateral joint space with compensatory widening and osteophyte. Mild joint space calcification. IMPRESSION: 1. No acute osseous abnormality. 2. Advanced arthritis of the knee. Suprapatellar joint effusion with multiple calcified loose bodies 3. Possible small foreign body overlying the suprapatellar soft tissues Electronically Signed   By: Donavan Foil M.D.   On: 04/11/2019 16:16    CT Head Wo Contrast Result Date: 04/09/2019 CLINICAL DATA:  Altered mental status with left-sided weakness over 24 hours. EXAM: CT HEAD WITHOUT CONTRAST TECHNIQUE: Contiguous axial images were obtained from the base of the skull through the vertex without intravenous contrast. COMPARISON:  CT head 04/03/2019 FINDINGS: Brain: New area of hypodensity in the right occipital lobe  compatible with acute to subacute infarct. This was not seen previously. No associated hemorrhage. Mild atrophy. Mild chronic microvascular ischemic changes in the white matter. Vascular: Negative for hyperdense vessel Skull: The negative Sinuses/Orbits: Paranasal sinuses clear.  Bilateral ocular surgery. Other: None IMPRESSION: New area of hypodensity right occipital lobe compatible with acute/subacute PCA infarct. No associated hemorrhage Mild atrophy and mild chronic microvascular ischemia. These results were called by telephone at the time of interpretation on 04/09/2019 at 9:12 am to provider Sabra Heck, who verbally acknowledged these results. Electronically Signed   By: Franchot Gallo M.D.   On: 04/09/2019 09:13      MR BRAIN WO CONTRAST Result Date: 04/09/2019 CLINICAL DATA:  Right occipital infarct. EXAM: MRI HEAD WITHOUT CONTRAST TECHNIQUE: Multiplanar, multiecho pulse sequences of the brain and surrounding structures were obtained without intravenous contrast. COMPARISON:  Head CT 04/09/2019 FINDINGS: Multiple sequences are up to moderately motion degraded. Brain: As seen on today's earlier CT, there is a large acute to early subacute right PCA infarct involving much of the right occipital lobe and portions of the posteromedial right temporal lobe and thalamus. There is cytotoxic edema with regional sulcal effacement and mild mass effect on the right lateral ventricle. No midline shift, intracranial hemorrhage, or extra-axial fluid collection is identified. Patchy T2 hyperintensities elsewhere in the cerebral white matter bilaterally are nonspecific but compatible with moderate chronic small vessel ischemic disease. Mild cerebral atrophy is within normal limits for age. Vascular: Major intracranial vascular flow voids are preserved. Skull and upper cervical spine: No suspicious marrow lesion. Cervical spondylosis. Sinuses/Orbits: Right cataract extraction. Paranasal sinuses and mastoid air cells are  clear. Other: None. IMPRESSION: 1. Large acute to early subacute right PCA infarct. 2. Moderate chronic small vessel ischemic disease. Electronically Signed   By: Logan Bores M.D.   On: 04/09/2019 17:50     CT SHOULDER LEFT WO CONTRAST Result Date: 04/12/2019 CLINICAL DATA:  [place]. Left shoulder pain. EXAM: CT OF THE UPPER LEFT EXTREMITY WITHOUT CONTRAST TECHNIQUE: Multidetector CT imaging of the upper left extremity was performed according to the  standard protocol. COMPARISON:  None. FINDINGS: Moderate glenohumeral and AC joint degenerative changes. No acute fracture of the humeral head or neck. No dislocation. Marked narrowing of the humeroacromial space most consistent with a full-thickness retracted supraspinatus tendon tear, likely chronic. The visualized left ribs are intact. The visualized left lung is grossly clear. IMPRESSION: 1. No acute fracture or dislocation. 2. Moderate glenohumeral and AC joint degenerative changes. 3. Full-thickness retracted supraspinatus tendon tear, likely chronic. Electronically Signed   By: Marijo Sanes M.D.   On: 04/12/2019 05:31      12-lead ECG SR All prior EKG's in EPIC reviewed with no documented atrial fibrillation  Telemetry SR, intermittently with occ-frequent PVCs, infrequent brief PATs  Assessment and Plan:  1. Cryptogenic stroke The patient presents with cryptogenic stroke.  Dr. Lovena Le spoke at length with the patient and his daughter at bedside about monitoring for afib with either a 30 day event monitor or an implantable loop recorder.  Risks, benefits, and alteratives to implantable loop recorder were discussed with the patient today.   At this time, the patient they are very clear in the decision to proceed with implantable loop recorder.   Given CM, recommend once able to pursue BP normalization, betablocker and ACE or ARB therapy.  May benefit from general cardiology referral outpatient for further workup and management.  I will arrange  initially f/u with Dr. Lovena Le in the [facility] office   Wound care was reviewed with the patient (keep incision clean and dry for 3 days).  Wound check will be scheduled for the patient  Please call with questions.   Renee Dyane Dustman, PA-C 04/12/2019   EP Attending  Patient seen and examined. Agree with the findings as noted above. The patient has sustained a cryptogenic stroke. He had no LAA thrombus on TEE and he has mild/mod LV dysfunction. Tele did not show atrial fib. I have discussed the indications/risks/benefits/goals/expectations of ILR insertion and he wishes to proceed.  Mikle Bosworth.D.

## 2019-04-12 NOTE — Consult Note (Signed)
Reason for Consult:Joint pain Referring Physician: Exodus Kutzer is an 84 y.o. male.  HPI: Kylle was admitted for stroke 3d ago. While here he has c/o various joint pain but especially his left arm (shoulder and elbow) and bilateral knees. It was hampering his mobility enough that the hospitalist obtained x-rays and consulted orthopedic surgery to see if we could offer something. X-rays/CT showed fairly advanced OA. He notes the left arm especially is worse since he fell several weeks ago. He has had multiple joint injections which have generally helped in the past.  Past Medical History:  Diagnosis Date  . Arthritis   . Hyperlipidemia   . Hypertension     History reviewed. No pertinent surgical history.  History reviewed. No pertinent family history.  Social History:  reports that he has never smoked. His smokeless tobacco use includes chew. He reports that he does not drink alcohol. No history on file for drug.  Allergies: No Known Allergies  Medications: I have reviewed the patient's current medications.  No results found for this or any previous visit (from the past 48 hour(s)).  DG Elbow 2 Views Left  Result Date: 04/11/2019 CLINICAL DATA:  Pain with limited range of motion EXAM: LEFT ELBOW - 2 VIEW COMPARISON:  None. FINDINGS: No acute displaced fracture or malalignment. Prominent enthesophytes at the olecranon coronoid and trochlea. Prominent osteophytes at the radial head and neck. Suspected elbow effusion. Possible calcified loose body at the anterior joint space. Bony protuberance at the distal metaphysis of the humerus on the ulnar side, questionable for osteochondroma. IMPRESSION: 1. No acute osseous abnormality. 2. Suspected elbow effusion with possible calcified loose body anteriorly. Prominent enthesophytes at the elbow joint raise possibility of idiopathic skeletal hyperostosis. 3. Bony protuberance on the ulnar side of distal humerus, question osteochondroma.  Electronically Signed   By: Donavan Foil M.D.   On: 04/11/2019 16:14   DG Knee 1-2 Views Left  Result Date: 04/11/2019 CLINICAL DATA:  Pain with limited range of motion EXAM: LEFT KNEE - 1-2 VIEW COMPARISON:  None. FINDINGS: No fracture or malalignment. Mild vascular calcifications. Small knee effusion with multiple calcified suprapatellar loose bodies. Punctate opacity overlying the suprapatellar soft tissues may reflect a small foreign body. Advanced arthritis involving the medial and patellofemoral compartments with joint space narrowing and prominent osteophytosis. Degenerative changes of the lateral joint space with compensatory widening and osteophyte. Mild joint space calcification. IMPRESSION: 1. No acute osseous abnormality. 2. Advanced arthritis of the knee. Suprapatellar joint effusion with multiple calcified loose bodies 3. Possible small foreign body overlying the suprapatellar soft tissues Electronically Signed   By: Donavan Foil M.D.   On: 04/11/2019 16:16   DG Knee 1-2 Views Right  Result Date: 04/11/2019 CLINICAL DATA:  Knee pain EXAM: RIGHT KNEE - 1-2 VIEW COMPARISON:  None. FINDINGS: No fracture or malalignment. Tricompartment arthritis of the knee, severe involving the patellofemoral and medial joint spaces. Mild joint space calcification. Small knee effusion with small calcified suprapatellar loose bodies. Vascular calcifications. IMPRESSION: 1. No acute osseous abnormality. 2. Advanced tricompartment arthritis of the knee. Small suprapatellar effusion with small calcified loose bodies 3. Joint space calcifications as may be seen with chondrocalcinosis. Electronically Signed   By: Donavan Foil M.D.   On: 04/11/2019 16:17   CT SHOULDER LEFT WO CONTRAST  Result Date: 04/12/2019 CLINICAL DATA:  [place]. Left shoulder pain. EXAM: CT OF THE UPPER LEFT EXTREMITY WITHOUT CONTRAST TECHNIQUE: Multidetector CT imaging of the upper left extremity  was performed according to the standard  protocol. COMPARISON:  None. FINDINGS: Moderate glenohumeral and AC joint degenerative changes. No acute fracture of the humeral head or neck. No dislocation. Marked narrowing of the humeroacromial space most consistent with a full-thickness retracted supraspinatus tendon tear, likely chronic. The visualized left ribs are intact. The visualized left lung is grossly clear. IMPRESSION: 1. No acute fracture or dislocation. 2. Moderate glenohumeral and AC joint degenerative changes. 3. Full-thickness retracted supraspinatus tendon tear, likely chronic. Electronically Signed   By: Marijo Sanes M.D.   On: 04/12/2019 05:31   ECHOCARDIOGRAM COMPLETE  Result Date: 04/10/2019    ECHOCARDIOGRAM REPORT   Patient Name:   Curtis Jenkins Date of Exam: 04/10/2019 Medical Rec #:  [id number]        Height:       72.0 in Accession #:    [id number]       Weight:       240.0 lb Date of Birth:  Feb 21, 1935         BSA:          2.302 m Patient Age:    35 years         BP:           152/68 mmHg Patient Gender: M                HR:           68 bpm. Exam Location:  Inpatient Procedure: 2D Echo Indications:    stroke  History:        Patient has no prior history of Echocardiogram examinations.                 Risk Factors:Dyslipidemia and Hypertension.  Sonographer:    Johny Chess Referring Phys: [id number] COURAGE EMOKPAE  Sonographer Comments: Image acquisition challenging due to uncooperative patient. IMPRESSIONS  1. Left ventricular ejection fraction, by estimation, is 40 to 45%. The left ventricle has mildly decreased function. The left ventricle demonstrates global hypokinesis. The left ventricular internal cavity size was mildly dilated. Left ventricular diastolic parameters are consistent with Grade I diastolic dysfunction (impaired relaxation).  2. Right ventricular systolic function is normal. The right ventricular size is normal.  3. The mitral valve is grossly normal. Trivial mitral valve regurgitation. No evidence of  mitral stenosis.  4. The aortic valve is normal in structure. Aortic valve regurgitation is not visualized. No aortic stenosis is present. FINDINGS  Left Ventricle: Left ventricular ejection fraction, by estimation, is 40 to 45%. The left ventricle has mildly decreased function. The left ventricle demonstrates global hypokinesis. The left ventricular internal cavity size was mildly dilated. There is  no left ventricular hypertrophy. Left ventricular diastolic parameters are consistent with Grade I diastolic dysfunction (impaired relaxation). Right Ventricle: The right ventricular size is normal. No increase in right ventricular wall thickness. Right ventricular systolic function is normal. Left Atrium: Left atrial size was normal in size. Right Atrium: Right atrial size was normal in size. Pericardium: There is no evidence of pericardial effusion. Mitral Valve: The mitral valve is grossly normal. Trivial mitral valve regurgitation. No evidence of mitral valve stenosis. Tricuspid Valve: The tricuspid valve is grossly normal. Tricuspid valve regurgitation is not demonstrated. No evidence of tricuspid stenosis. Aortic Valve: The aortic valve is normal in structure. Aortic valve regurgitation is not visualized. No aortic stenosis is present. Pulmonic Valve: The pulmonic valve was grossly normal. Pulmonic valve regurgitation is not visualized. No evidence of pulmonic stenosis. Aorta:  The aortic root and ascending aorta are structurally normal, with no evidence of dilitation. IAS/Shunts: The atrial septum is grossly normal.  LEFT VENTRICLE PLAX 2D LVIDd:         5.40 cm      Diastology LVIDs:         3.90 cm      LV e' lateral:   6.42 cm/s LV PW:         0.90 cm      LV E/e' lateral: 10.0 LV IVS:        1.00 cm      LV e' medial:    4.68 cm/s LVOT diam:     2.20 cm      LV E/e' medial:  13.7 LV SV:         67 LV SV Index:   29 LVOT Area:     3.80 cm  LV Volumes (MOD) LV vol d, MOD A2C: 129.0 ml LV vol d, MOD A4C: 122.0  ml LV vol s, MOD A2C: 69.2 ml LV vol s, MOD A4C: 73.6 ml LV SV MOD A2C:     59.8 ml LV SV MOD A4C:     122.0 ml LV SV MOD BP:      55.7 ml RIGHT VENTRICLE RV S prime:     16.20 cm/s TAPSE (M-mode): 2.2 cm LEFT ATRIUM             Index       RIGHT ATRIUM           Index LA diam:        3.10 cm 1.35 cm/m  RA Area:     14.20 cm LA Vol (A2C):   73.6 ml 31.97 ml/m RA Volume:   32.30 ml  14.03 ml/m LA Vol (A4C):   60.8 ml 26.41 ml/m LA Biplane Vol: 70.2 ml 30.49 ml/m  AORTIC VALVE LVOT Vmax:   92.70 cm/s LVOT Vmean:  60.200 cm/s LVOT VTI:    0.176 m MITRAL VALVE MV Area (PHT): 2.66 cm    SHUNTS MV Decel Time: 285 msec    Systemic VTI:  0.18 m MV E velocity: 64.30 cm/s  Systemic Diam: 2.20 cm MV A velocity: 87.40 cm/s MV E/A ratio:  0.74 Mertie Moores MD Electronically signed by Mertie Moores MD Signature Date/Time: 04/10/2019/3:12:04 PM    Final     Review of Systems  HENT: Negative for ear discharge, ear pain, hearing loss and tinnitus.   Eyes: Negative for photophobia and pain.  Respiratory: Negative for cough and shortness of breath.   Cardiovascular: Negative for chest pain.  Gastrointestinal: Negative for abdominal pain, nausea and vomiting.  Genitourinary: Negative for dysuria, flank pain, frequency and urgency.  Musculoskeletal: Positive for arthralgias (Left shoulder, bilateral knees) and back pain. Negative for myalgias and neck pain.  Neurological: Negative for dizziness and headaches.  Hematological: Does not bruise/bleed easily.  Psychiatric/Behavioral: The patient is not nervous/anxious.    Blood pressure 140/77, pulse (!) 56, temperature 98.5 F (36.9 C), temperature source Oral, resp. rate 16, height 6' (1.829 m), weight 108.9 kg, SpO2 99 %. Physical Exam  Constitutional: He appears well-developed and well-nourished. No distress.  HENT:  Head: Normocephalic and atraumatic.  Eyes: Conjunctivae are normal. Right eye exhibits no discharge. Left eye exhibits no discharge. No scleral  icterus.  Cardiovascular: Normal rate and regular rhythm.  Respiratory: Effort normal. No respiratory distress.  Musculoskeletal:     Cervical back: Normal range of motion.  Comments: Left shoulder, elbow, wrist, digits- no skin wounds, anterior shoulder TTP, pain with resisted flexion, no pain with PROM, no instability, no blocks to motion  Sens  Ax/R/M/U intact  Mot   Ax/ R/ PIN/ M/ AIN/ U intact  Rad 2+  RLE No traumatic wounds, ecchymosis, or rash  Mild diffuse TTP  No ankle effusion, mod knee effusion  Knee stable to varus/ valgus and anterior/posterior stress but painful  Sens DPN, SPN, TN intact  Motor EHL, ext, flex, evers 5/5  DP 2+, No significant edema  Neurological: He is alert.  Skin: Skin is warm and dry. He is not diaphoretic.  Psychiatric: He has a normal mood and affect. His behavior is normal.    Assessment/Plan: OA -- Will aspirate right knee and then inject same and left shoulder.  Multiple medical problems including arthritis, hyperlipidemia, hypertension, OSA, CVA, and insomnia -- per primary team    Lisette Abu, PA-C Orthopedic Surgery [phone] 04/12/2019, 10:33 AM

## 2019-04-12 NOTE — Discharge Instructions (Signed)
Wound care instructions (heart monitor implant) Keep incision clean and dry for 3 days. You can remove outer dressing tomorrow. Leave steri-strips (little pieces of tape) on until seen in the office for wound check appointment. Call the office ([phone]) for redness, drainage, swelling, or fever.  

## 2019-04-12 NOTE — Progress Notes (Signed)
STROKE TEAM PROGRESS NOTE      INTERVAL HISTORY Patient remains neurologically unchanged.  He still has persistent peripheral vision loss and paresthesias.  His daughter is present at the bedside.  Patient is scheduled to undergo loop recorder later today and likely discharge home after that.  2D echo shows left ventricle ejection fraction of 40 to 45% with global hypokinesis.  No definite clot.      OBJECTIVE Vitals:   04/11/19 2355 04/12/19 0327 04/12/19 0742 04/12/19 1205  BP: 122/73 135/77 140/77 137/70  Pulse: 67 71 (!) 56 65  Resp: 18 18 16 16   Temp: 98.5 F (36.9 C) 98.3 F (36.8 C) 98.5 F (36.9 C) 98.7 F (37.1 C)  TempSrc: Oral Oral Oral Oral  SpO2: 96% 98% 99% 95%  Weight:      Height:        CBC:  Recent Labs  Lab 04/09/19 0401 04/10/19 0320  WBC 10.5 12.6*  NEUTROABS 6.7  --   HGB 14.5 15.4  HCT 43.4 45.8  MCV 92.7 92.5  PLT 245 400    Basic Metabolic Panel:  Recent Labs  Lab 04/09/19 0401 04/10/19 0320  NA 135 140  K 3.6 3.6  CL 101 103  CO2 25 22  GLUCOSE 122* 121*  BUN 22 21  CREATININE 1.38* 1.35*  CALCIUM 7.3* 7.5*  PHOS  --  3.3    Lipid Panel:     Component Value Date/Time   CHOL 129 04/04/2019 0500   TRIG 79 04/04/2019 0500   HDL 36 (L) 04/04/2019 0500   CHOLHDL 3.6 04/04/2019 0500   VLDL 16 04/04/2019 0500   LDLCALC 77 04/04/2019 0500   HgbA1c:  Lab Results  Component Value Date   HGBA1C 6.3 (H) 04/03/2019   Urine Drug Screen:     Component Value Date/Time   LABOPIA NONE DETECTED 04/09/2019 0404   COCAINSCRNUR NONE DETECTED 04/09/2019 0404   LABBENZ NONE DETECTED 04/09/2019 0404   AMPHETMU NONE DETECTED 04/09/2019 0404   THCU NONE DETECTED 04/09/2019 0404   LABBARB NONE DETECTED 04/09/2019 0404    Alcohol Level     Component Value Date/Time   ETH <10 04/09/2019 0404    IMAGING  CT ANGIO HEAD W OR WO CONTRAST CT ANGIO NECK W OR WO CONTRAST 04/09/2019 IMPRESSION:  1. Proximal right PCA occlusion.  2.  Widely patent carotid and vertebral arteries.  3. Aortic Atherosclerosis (ICD10-I70.0).   DG Abd 1 View 04/09/2019 IMPRESSION:  NG tube is unchanged in positioning. If possible, further advancement into the stomach is recommended.   CT Head Wo Contrast 04/09/2019 IMPRESSION:  New area of hypodensity right occipital lobe compatible with acute/subacute PCA infarct. No associated hemorrhage Mild atrophy and mild chronic microvascular ischemia.   MR BRAIN WO CONTRAST 04/09/2019 IMPRESSION:  1. Large acute to early subacute right PCA infarct.  2. Moderate chronic small vessel ischemic disease.   DG Chest Portable 1 View 04/09/2019 IMPRESSION:  No significant interval change in positioning of the NG tube. Consider further advancement with repeat radiograph to follow.    DG Chest Portable 1 View  04/09/2019 IMPRESSION:  Feeding tube is identified with distal tip probably at the GE junction. Advancement is recommended.   Transthoracic Echocardiogram  00/00/2021 Pending  ECG - SR rate 87 BPM. Frequent PVCs (See cardiology reading for complete details)  PHYSICAL EXAM Blood pressure 137/70, pulse 65, temperature 98.7 F (37.1 C), temperature source Oral, resp. rate 16, height 6' (1.829 m), weight 108.9 kg,  SpO2 95 %. Pleasant mildly obese elderly Caucasian male not in distress. . Afebrile. Head is nontraumatic. Neck is supple without bruit.    Cardiac exam no murmur or gallop. Lungs are clear to auscultation. Distal pulses are well felt. Neurological Exam : Awake alert oriented to time place and person.  Speech and language appear normal.  Extraocular movements are full range no nystagmus.  Left dense homonymous hemianopsia.  Face is symmetric without weakness.  Tongue midline.  Mild left upper extremity weakness secondary to shoulder pain.  Left body subjective diminished touch pinprick sensation.  Deep tendon reflexes are symmetric.  Plantars downgoing.  Gait not  tested. ASSESSMENT/PLAN [name] is a 84 y.o. male with history of HTN, arthtritis and HLD who presented to [facility] for left side weakness, speech problems and dysphagia. He did not receive IV t-PA due to late presentation (>4.5 hours from time of onset)  Stroke: Large acute to early subacute right PCA infarct - embolic - source unknown.  Resultant  Left HH and left numbness  Code Stroke CT Head - not ordered  CT head - New area of hypodensity right occipital lobe compatible with acute/subacute PCA infarct. No associated hemorrhage Mild atrophy and mild chronic microvascular ischemia.   MRI head - Large acute to early subacute right PCA infarct. Moderate chronic small vessel ischemic disease  MRA head - not ordered  CTA H&N - Proximal right PCA occlusion. Widely patent carotid and vertebral arteries.   CT Perfusion - not ordered  Carotid Doppler - CTA neck performed - carotid dopplers not indicated.  2D Echo -left ventricle ejection fraction of 40 to 45% with global hypokinesis.  No definite clot.    Sars Corona Virus 2 - negative  LDL - 77  HgbA1c - 6.3  UDS - negative  VTE prophylaxis - [place] Heparin Diet  Diet Order            DIET DYS 2 Room service appropriate? Yes; Fluid consistency: Nectar Thick  Diet effective now              No antithrombotic prior to admission, now on aspirin 81 mg daily and Plavix 75 mg daily into 3 weeks followed by aspirin alone  Patient counseled to be compliant with his antithrombotic medications  Ongoing aggressive stroke risk factor management  Therapy recommendations: Inpatient rehab disposition:  Pending  Hypertension  Home BP meds: Norvasc  Current BP meds: none  Stable . Permissive hypertension (OK if < 220/120) but gradually normalize in 5-7 days . Long-term BP goal normotensive  Hyperlipidemia  Home Lipid lowering medication: Lipitor 20 mg daily  LDL 77, goal < 70  Current lipid lowering medication:  Lipitor 80 mg daily   Continue statin at discharge  Other Stroke Risk Factors  Advanced age  Chews tobacco - advised to stop smoking  Obesity, Body mass index is 32.55 kg/m., recommend weight loss, diet and exercise as appropriate   Other Active Problems  Code status - Full code.  Aortic Atherosclerosis (ICD10-I70.0)  Leukocytosis - 12.6 (afebrile)  CKD - stage 3a - creatinine - 1.35  Hypocalcemia - 7.5  Hospital day # 3  He presented with subacute right PCA infarct likely of cryptogenic etiology.  Recommend inserting loop recorder for paroxysmal A. fib.  Continue aspirin 81 and Plavix 75 mg daily for 3 weeks followed by aspirin alone.  Patient advised not to drive till his peripheral vision loss improves.  Aggressive risk factor modification.  Patient counseled  to be compliant with using his CPAP.  Stroke team will sign off.  Follow-up as an outpatient stroke clinic in 6 weeks. Discussed with patient and daughter and answered questions Antony Contras, MD To contact Stroke Continuity provider, please refer to [email or web address] After hours, contact General Neurology

## 2019-04-12 NOTE — Progress Notes (Addendum)
PT Cancellation Note  Patient Details Name: Curtis Jenkins MRN: [id number] DOB: [birth year]   Cancelled Treatment:    Reason Eval/Treat Not Completed: (P) Medical issues which prohibited therapy;Patient at procedure or test/unavailable(Off unit for loop recorder placement, will f/u per POC.)   Jaqueline Uber Eli Hose 04/12/2019, 1:30 PM  Erasmo Leventhal , PTA Acute Rehabilitation Services Pager [phone] Office [phone]

## 2019-04-13 DIAGNOSIS — E1122 Type 2 diabetes mellitus with diabetic chronic kidney disease: Secondary | ICD-10-CM | POA: Diagnosis not present

## 2019-04-13 DIAGNOSIS — I129 Hypertensive chronic kidney disease with stage 1 through stage 4 chronic kidney disease, or unspecified chronic kidney disease: Secondary | ICD-10-CM | POA: Diagnosis not present

## 2019-04-13 DIAGNOSIS — I639 Cerebral infarction, unspecified: Secondary | ICD-10-CM | POA: Diagnosis not present

## 2019-04-13 DIAGNOSIS — N184 Chronic kidney disease, stage 4 (severe): Secondary | ICD-10-CM | POA: Diagnosis not present

## 2019-04-13 DIAGNOSIS — R1310 Dysphagia, unspecified: Secondary | ICD-10-CM | POA: Diagnosis not present

## 2019-04-13 DIAGNOSIS — M25512 Pain in left shoulder: Secondary | ICD-10-CM

## 2019-04-13 DIAGNOSIS — G8929 Other chronic pain: Secondary | ICD-10-CM

## 2019-04-13 DIAGNOSIS — R627 Adult failure to thrive: Secondary | ICD-10-CM

## 2019-04-13 MED ORDER — ENSURE ENLIVE PO LIQD: 237.0000 mL | Freq: Three times a day (TID) | ORAL | 12 refills | Status: DC

## 2019-04-13 MED ORDER — CLOPIDOGREL BISULFATE 75 MG PO TABS: 75.0000 mg | ORAL_TABLET | Freq: Every day | ORAL | 0 refills | Status: AC

## 2019-04-13 MED ORDER — ATORVASTATIN CALCIUM 80 MG PO TABS: 80.0000 mg | ORAL_TABLET | Freq: Every day | ORAL | 0 refills | Status: DC

## 2019-04-13 MED ORDER — ZOLPIDEM TARTRATE 5 MG PO TABS: 5.0000 mg | ORAL_TABLET | Freq: Every evening | ORAL | 0 refills | Status: DC | PRN

## 2019-04-13 MED ORDER — RESOURCE THICKENUP CLEAR PO POWD: ORAL | 3 refills | Status: DC

## 2019-04-13 MED ORDER — ASPIRIN 81 MG PO TBEC: 81.0000 mg | DELAYED_RELEASE_TABLET | Freq: Every day | ORAL | 3 refills | Status: DC

## 2019-04-13 NOTE — Progress Notes (Signed)
Occupational Therapy Treatment Patient Details Name: Curtis Jenkins MRN: [id number] DOB: 05-Sep-1935 Today's Date: 04/13/2019    History of present illness 84 y.o. male with   With PMH HTN, HLD who presented to [facility] for increased left side weakness, speech problems and dysphagia. transferred to [facility]. [place] revealed a right occipital lobe hypodensity.MRI shows a right PCA infarct.   OT comments  Patient continues to make steady progress towards goals in skilled OT session. Patient's session encompassed co treat with Pt in order to address deficits in ADLs, transfers, and functional mobility. Pt requires max verbal cues to sequence, however refused to comply to directions the majority of the time. Pt refused to incorporate RUE into ADLs at EOB despite education and max encouragement. Pt would continue to benefit from skilled services in order address current functional deficits; will continue to follow acutely.     Follow Up Recommendations  CIR;Supervision/Assistance - 24 hour    Equipment Recommendations  Other (comment)(TBD at next venue)    Recommendations for Other Services      Precautions / Restrictions Precautions Precautions: Fall Precaution Comments: L hemiparesis Restrictions Weight Bearing Restrictions: No       Mobility Bed Mobility Overal bed mobility: Needs Assistance Bed Mobility: Sit to Supine     Supine to sit: Max assist;+2 for physical assistance Sit to supine: Total assist;+2 for physical assistance   General bed mobility comments: Mod-Max A x2 in order to sit EOB, pt insistant on completing tasks in his own manner, not adhering to therapist's suggesitions or cues  Transfers Overall transfer level: Needs assistance Equipment used: Ambulation equipment used Transfers: Sit to/from Stand Sit to Stand: Max assist;+2 physical assistance         General transfer comment: Pt required assistance to move to edge of recliner to prepare for transfer back to bed.   He utilized the sara stedy and PTA and NT used bed pad to boost into standing.,    Balance Overall balance assessment: Needs assistance Sitting-balance support: Feet supported;No upper extremity supported Sitting balance-Leahy Scale: Fair     Standing balance support: During functional activity Standing balance-Leahy Scale: Poor Standing balance comment: Heavy L lean in standing                           ADL either performed or assessed with clinical judgement   ADL Overall ADL's : Needs assistance/impaired     Grooming: Sitting;Wash/dry face;Wash/dry hands;Brushing hair;Set up Grooming Details (indicate cue type and reason): Pt refused to incorporate RUE to complete tasks, set up with LUE                 Toilet Transfer: Maximal assistance;+2 for physical assistance;Stand-pivot Toilet Transfer Details (indicate cue type and reason): utilized stedy simulated to recliner         Functional mobility during ADLs: Maximal assistance;+2 for physical assistance General ADL Comments: Pt able to sit EOB to complete ADLs with min A for approximately 10 minutes, attempted to stand with RW but unable, utilized stedy to complete 2 sit to stands over to [employer]     Praxis      Cognition Arousal/Alertness: Awake/alert Behavior During Therapy: WFL for tasks assessed/performed Overall Cognitive Status: Impaired/Different from baseline Area of Impairment: Orientation;Attention;Safety/judgement;Awareness;Problem solving;Following commands                 Orientation Level: Disoriented to;Time  Current Attention Level: Sustained   Following Commands: Follows one step commands with increased time Safety/Judgement: Decreased awareness of safety;Decreased awareness of deficits Awareness: Intellectual Problem Solving: Slow processing;Difficulty sequencing;Requires verbal cues;Requires tactile cues General Comments: Pt very much wants  to be in control of his movements and required max coaxing to participate in a safe proper manner.        Exercises     Shoulder Instructions       General Comments      Pertinent Vitals/ Pain       Pain Assessment: Faces Faces Pain Scale: Hurts little more Pain Location: reports decreased pain in R knee and L shoulder but remains to complain of joint pain. Pain Descriptors / Indicators: Discomfort;Sore Pain Intervention(s): Limited activity within patient's tolerance;Monitored during session;Repositioned  Home Living                                          Prior Functioning/Environment              Frequency  Min 3X/week        Progress Toward Goals  OT Goals(current goals can now be found in the care plan section)  Progress towards OT goals: Progressing toward goals  Acute Rehab OT Goals Patient Stated Goal: To go home Time For Goal Achievement: 04/24/19 Potential to Achieve Goals: Good  Plan Discharge plan remains appropriate    Co-evaluation    PT/OT/SLP Co-Evaluation/Treatment: Yes Reason for Co-Treatment: Complexity of the patient's impairments (multi-system involvement)   OT goals addressed during session: ADL's and self-care      AM-PAC OT "6 Clicks" Daily Activity     Outcome Measure   Help from another person eating meals?: Total Help from another person taking care of personal grooming?: A Lot Help from another person toileting, which includes using toliet, bedpan, or urinal?: A Lot Help from another person bathing (including washing, rinsing, drying)?: A Lot Help from another person to put on and taking off regular upper body clothing?: A Lot Help from another person to put on and taking off regular lower body clothing?: A Lot 6 Click Score: 11    End of Session Equipment Utilized During Treatment: Gait belt;Other (comment)(Stedy)  OT Visit Diagnosis: Unsteadiness on feet (R26.81);Other abnormalities of gait and  mobility (R26.89);Muscle weakness (generalized) (M62.81);Hemiplegia and hemiparesis;Pain Hemiplegia - Right/Left: Left Hemiplegia - caused by: Cerebral infarction Pain - part of body: Knee;Ankle and joints of foot   Activity Tolerance Patient tolerated treatment well   Patient Left in chair;with call bell/phone within reach;with chair alarm set   Nurse Communication Mobility status        Time: 1017-5102 OT Time Calculation (min): 33 min  Charges: OT General Charges $OT Visit: 1 Visit OT Treatments $Self Care/Home Management : 8-22 mins  Corinne Ports E. Neylan Koroma, COTA/L [facility] [place] 04/13/2019, 2:46 PM

## 2019-04-13 NOTE — Progress Notes (Signed)
Patient left facility with PTAR via stretcher.  Daughter at bedside and will meet the patient and transport team at his home.  Patient alert and denies complaint at time of departure.

## 2019-04-13 NOTE — Progress Notes (Signed)
  Speech Language Pathology Treatment: Dysphagia  Patient Details Name: Curtis Jenkins MRN: [id number] DOB: Mar 17, 1935 Today's Date: 04/13/2019 Time: 7619-5093 SLP Time Calculation (min) (ACUTE ONLY): 10 min  Assessment / Plan / Recommendation Clinical Impression  Pt encountered in bed with RN assisting pt to bedpan. Pt on bedpan throughout the session, therefore only agreeable to drink liquids. Pt reported he has been drinking thin liquids over the past day, but stated he has not had any difficulties. Pt observed with thin liquids with x1 immediate throat clear. A delayed cough was also noted, but was well after consuming POs, likely not d/t aspiration. Pt stated he would like to continue with diet (Dys 2), d/t being edentulous, requiring his foods to be softer and chopped up. Recommend Dys 2 diet and thin liquids, ensuring the pt follow strict aspiration precautions.    HPI HPI: Pt is an 84 yo male who presented to [facility] for worsening L sided weakness and speech/swallowing problems. He was transferred to [facility] for further management. MRI shows a large R PCA infarct. PMH includes: HTN, HLD, and per esophagram in 2002 [facility] and [employer]      SLP Plan  Continue with current plan of care       Recommendations  Diet recommendations: Dysphagia 2 (fine chop);Thin liquid Liquids provided via: Cup;Straw Medication Administration: Whole meds with puree Supervision: Staff to assist with self feeding;Full supervision/cueing for compensatory strategies Compensations: Slow rate;Small sips/bites;Minimize environmental distractions Postural Changes and/or Swallow Maneuvers: Seated upright 90 degrees;Upright 30-60 min after meal                Oral Care Recommendations: Oral care BID Follow up Recommendations: Inpatient Rehab SLP Visit Diagnosis: Dysphagia, unspecified (R13.10) Plan: Continue with current plan of care       GO               Aline August, Student SLP Office:  [phone]  04/13/2019, 3:14 PM

## 2019-04-13 NOTE — Discharge Summary (Addendum)
Discharge Summary  Curtis Jenkins INO:[id number] DOB: [birth year]  PCP: Redmond School, MD  Admit date: 04/09/2019 Discharge date: 04/13/2019  Time spent: 45mins, more than 50% time spent on coordination of care.  Recommendations for Outpatient Follow-up:  1. F/u with PCP within a week  for hospital discharge follow up, repeat cbc/bmp at follow up 2. F/u with neurology 3. F/u with EP cardiology on 4/8 and 5/6 4. Follow-up with orthopedics 5. Family declined skilled nursing facility placement , elected to get home patient home, maximize home health service and DME.  Appreciate case manager assistance  Discharge Diagnoses:  Active Hospital Problems   Diagnosis Date Noted  . Rt Occipital Lobe Stroke with Lt hemiparesis 04/09/2019  . Effusion of left elbow 04/12/2019  . Chronic left shoulder pain 04/12/2019  . Dysphagia following cerebrovascular accident 04/09/2019  . Speech disturbance due to Acute CVA 04/09/2019  . Tobacco abuse-Chews Tobacco 04/09/2019  . HTN (hypertension) 04/03/2019  . HLD (hyperlipidemia) 04/03/2019  . Hypocalcemia 04/03/2019    Resolved Hospital Problems  No resolved problems to display.    Discharge Condition: stable  Diet recommendation: heart healthy Aspiration precaution Supervision: Staff to assist with self feeding;Full supervision/cueing for compensatory strategies Compensations: Slow rate;Small sips/bites;Minimize environmental distractions Postural Changes and/or Swallow Maneuvers: Seated upright 90 degrees;Upright 30-60 min after meal  Speech initially recommended: Dysphagia 2 (fine chop);Nectar-thick liquid He has improved repeat speech eval on March 31 recommended fine chop diet and thin liquid ( speech not entered late in the afternoon)    Filed Weights   04/09/19 0330  Weight: 108.9 kg    History of present illness: (Per admitting MD Dr. Denton Brick) Curtis Jenkins  is a 84 y.o. male with a past medical history significant for HTN,  HLD, and DJD as well as insomnia who presents to the ED on 04/09/2019 with worsening left-sided weakness, speech problems and worsening swallowing difficulties  --No falls, no chest pains no palpitations no dizziness -Additional hx obtained from family---discussed with the patient and daughter Curtis Jenkins at bedside and pt's wife by phone  -Patient was admitted on 04/03/2019 with unsteady gait, weakness, heavy/numbness in his feet and hands and facial paresthesia--- he was found to have hypocalcemia (had tingling, numbness and paresthesia symptoms affecting both feet hands face and perioral area.) --His perioral numbness/tingling improved after placement of calcium at that time -His CT head was nonacute on 04/03/2019  -He was discharged on 04/04/2019 significantly improved after replacement of calcium -- He returns today with worsening neuro symptoms as outlined above--- with CT head showing acute/subacute right occipital lobe stroke-  In ED----chest x-ray without acute findings -Creatinine is 1.38 up from 1.2 about 5 days ago -UDS is negative and troponin is flat -EKG is sinus with QTC of 470 -LFTs WNL  --EDP discussed case with in-house neuro hospitalist at [facility] --Official neurohospitalist consult to be obtained when patient arrived at [facility] Course:  Principal Problem:   Rt Occipital Lobe Stroke with Lt hemiparesis Active Problems:   Hypocalcemia   HTN (hypertension)   HLD (hyperlipidemia)   Dysphagia following cerebrovascular accident   Speech disturbance due to Acute CVA   Tobacco abuse-Chews Tobacco   Effusion of left elbow   Chronic left shoulder pain  Acute right occipital lobe stroke with left hemiparesis and Left dense homonymous hemianopsia Neurology stroke team consulted and recommended the following" He presented with subacute right PCA infarct likely of cryptogenic etiology. Recommend insertingloop recorder for paroxysmal A. fib. Continueaspirin  81  and Plavix 75 mg daily for 3 weeks followed by aspirin alone. Patient advised not to drive till his peripheral vision loss improves. Aggressive risk factor modification. Patient counseled to be compliant with using his CPAP." -He is on Lipitor 80 mg daily -he is S/p loop recorder placement on 3/30 by Dr. Viann Fish incision clean and dry for 3 days). Wound check will bescheduled forthe patient  Dysphagia and dysarthria -Speech eval for swallowing and cognitive language evaluation -Dysphagia 2 diet with nectar thick liquid recommended initially -He improved on repeat speech eval, diet  upgraded to heart healthy thin liquid  History of chronic back pain as per history from the daughter at bedside Patient is on tramadol.  Patient had recent falls on to  the left side and currently is unable to move left arm. X-rays of bilateral knees , x rays of the elbow and CT of the shoulder ordered for further evaluation. CT of the shoulder Moderate glenohumeral and AC joint degenerative changes. Full-thickness retracted supraspinatus tendon tear, likely chronic.  x rays of the left elbow Suspected elbow effusion with possible calcified loose body anteriorly. Prominent enthesophytes at the elbow joint raise possibility of idiopathic skeletal hyperostosis. X rays of the left knee Advanced arthritis of the knee. Suprapatellar joint effusion with multiple calcified loose bodies Pain control and Physical Therapy.  Orthopedics consulted as detailed in below  Right knee effusion left shoulder pain Orthopedic input appreciated S/p Rightknee aspiration and injection and left shoulder injection on March 30,  Right knee synovial joint cell count WBC 20,000 is 92% of neutrophil, no crystal seen ,culture no growth -Case discussed with Ortho over the phone who recommend outpatient follow-up with orthopedic   Essential hypertension Well controlled. Resume home meds.   Stage II CKD Creatinine appears to be  at baseline at 1.3.  Obesity/OSA on cpap Body mass index is 32.55 kg/m.   FTT: Patient was evaluated by CIR recommended skilled nursing facility placement, family elected to bring patient home with home health.  Case Primary school teacher appreciated.  Procedures:  Loop recorder implantation on March 30 th by Dr. Lovena Le  Right knee and left shoulder injection on March 30th by orthopedics  Consultations:  Neurology  EP cardiology  Orthopedics  CIR  Discharge Exam: BP 133/78 (BP Location: Right Arm)   Pulse 73   Temp 98.1 F (36.7 C) (Oral)   Resp 16   Ht 6' (1.829 m)   Wt 108.9 kg   SpO2 99%   BMI 32.55 kg/m   General: NAD, chronically ill-appearing, alert and interactive, he is happy that he can go home today so he can play with his dog Cardiovascular: RRR Respiratory: Clear to auscultation bilaterally  Discharge Instructions You were cared for by a hospitalist during your hospital stay. If you have any questions about your discharge medications or the care you received while you were in the hospital after you are discharged, you can call the unit and asked to speak with the hospitalist on call if the hospitalist that took care of you is not available. Once you are discharged, your primary care physician will handle any further medical issues. Please note that NO REFILLS for any discharge medications will be authorized once you are discharged, as it is imperative that you return to your primary care physician (or establish a relationship with a primary care physician if you do not have one) for your aftercare needs so that they can reassess your need for medications and monitor your  lab values.  Discharge Instructions    Ambulatory referral to Neurology   Complete by: As directed    An appointment is requested in approximately: 4 weeks subacute right PCA infarct   Diet - low sodium heart healthy   Complete by: As directed    Aspiration precaution Supervision: Staff to  assist with self feeding;Full supervision/cueing for compensatory strategies Compensations: Slow rate;Small sips/bites;Minimize environmental distractions Postural Changes and/or Swallow Maneuvers: Seated upright 90 degrees;Upright 30-60 min after meal   Discharge instructions   Complete by: As directed    not to drive till his peripheral vision loss improves Fall precaution   Increase activity slowly   Complete by: As directed      Allergies as of 04/13/2019   No Known Allergies     Medication List    TAKE these medications   amLODipine 5 MG tablet Commonly known as: NORVASC Take 5 mg by mouth daily.   aspirin 81 MG EC tablet Take 1 tablet (81 mg total) by mouth daily. Start taking on: April 14, 2019   atorvastatin 80 MG tablet Commonly known as: LIPITOR Take 1 tablet (80 mg total) by mouth at bedtime. What changed:   medication strength  how much to take   calcium-vitamin D 500-200 MG-UNIT tablet Commonly known as: OSCAL WITH D Take 1 tablet by mouth 2 (two) times daily.   clopidogrel 75 MG tablet Commonly known as: PLAVIX Take 1 tablet (75 mg total) by mouth daily for 18 days. Start taking on: April 14, 2019   feeding supplement (ENSURE ENLIVE) Liqd Take 237 mLs by mouth 3 (three) times daily after meals.   traMADol 50 MG tablet Commonly known as: ULTRAM Take 50 mg by mouth 3 (three) times daily as needed.   Vitamin D (Ergocalciferol) 1.25 MG (50000 UNIT) Caps capsule Commonly known as: DRISDOL Take 1 capsule (50,000 Units total) by mouth every 7 (seven) days.   zolpidem 5 MG tablet Commonly known as: AMBIEN Take 1 tablet (5 mg total) by mouth at bedtime as needed for sleep. What changed:   medication strength  how much to take  reasons to take this            Durable Medical Equipment  (From admission, onward)         Start     Ordered   04/12/19 1456  For home use only DME Hospital bed  Once    Question Answer Comment  Length of Need  Lifetime   The above medical condition requires: Patient requires the ability to reposition frequently   Head must be elevated greater than: 30 degrees   Bed type Semi-electric   Hoyer Lift Yes      04/12/19 1455   04/12/19 1123  For home use only DME lightweight manual wheelchair with seat cushion  Once    Comments: Patient suffers from chronic Full-thickness retracted supraspinatus tendon tear  which impairs their ability to perform daily activities  in the home.  A walking aid will not resolve  issue with performing activities of daily living. A wheelchair will allow patient to safely perform daily activities. Patient is not able to propel themselves in the home using a standard weight wheelchair due to weakness. Patient can self propel in the lightweight wheelchair. Length of need indefinite .  Accessories: elevating leg rests (ELRs), wheel locks, extensions and anti-tippers.   04/12/19 1123         No Known Allergies Follow-up Information    [facility] Follow up.   Specialty: Cardiology Why: 04/21/2019 @ 2:30PM, wound check visit (heart monitor implant site) Contact information: [address] [place]       Evans Lance, MD Follow up.   Specialty: Cardiology Why: 05/19/2019 @ 10:30AM, cardiology follow up Contact information: [place] [address] [phone]        Varkey, Dax T, MD Follow up.   Specialty: Orthopedic Surgery Contact information: [address] [place] [address] [phone]        [facility] Follow up.   Why: stroke clinic for subacute right PCA infarct  Contact information: [address]     [address] [place] [address] [phone]       Redmond School, MD Follow up in 1 week(s).   Specialty: Internal Medicine Why: Hospital discharge follow-up, please bring all your medication for your PCP  to review. PCP to repeat basic lab works including CBC and BMP at hospital discharge follow-up. Contact information: [address] [place] [address] [phone]            The results of significant diagnostics from this hospitalization (including imaging, microbiology, ancillary and laboratory) are listed below for reference.    Significant Diagnostic Studies: CT ANGIO HEAD W OR WO CONTRAST  Result Date: 04/09/2019 CLINICAL DATA:  Stroke follow-up. Large right PCA infarct. EXAM: CT ANGIOGRAPHY HEAD AND NECK TECHNIQUE: Multidetector CT imaging of the head and neck was performed using the standard protocol during bolus administration of intravenous contrast. Multiplanar CT image reconstructions and MIPs were obtained to evaluate the vascular anatomy. Carotid stenosis measurements (when applicable) are obtained utilizing NASCET criteria, using the distal internal carotid diameter as the denominator. CONTRAST:  28mL OMNIPAQUE IOHEXOL 350 MG/ML SOLN COMPARISON:  None. FINDINGS: CTA NECK FINDINGS Aortic arch: Standard 3 vessel aortic arch with mild atherosclerotic plaque. No arch vessel origin stenosis. Right carotid system: Patent with small volume soft plaque at the carotid bifurcation. No evidence of stenosis or dissection. Left carotid system: Patent with small volume soft plaque at the common carotid artery origin and carotid bifurcation. No evidence of stenosis or dissection. Tortuous mid cervical ICA. Vertebral arteries: Patent and codominant without evidence of significant stenosis or dissection. Skeleton: Advanced C1-2 arthropathy with multiple erosions in the dens. Advanced spondylosis with bulky anterior vertebral ossification extending from C2 into the included thoracic spine. Severe facet arthrosis at C7-T1 with grade 1 anterolisthesis. Severe multilevel neural foraminal stenosis. Other neck: No evidence of cervical lymphadenopathy or mass. Nasogastric tube forms a loop in the  oropharynx. Upper chest: Clear lung apices. Review of the MIP images confirms the above findings CTA HEAD FINDINGS Anterior circulation: The internal carotid arteries are patent from skull base to carotid termini with mild nonstenotic plaque bilaterally. ACAs and MCAs are patent without evidence of proximal branch occlusion or significant proximal stenosis. The left ACA is dominant. No aneurysm is identified. Posterior circulation: The intracranial vertebral arteries are patent to the basilar with atherosclerotic irregularity bilaterally but no significant stenosis. Patent left PICA, right AICA, and bilateral SCAs are visualized. The basilar artery is widely patent. There is occlusion of the right PCA at the P1 level. The left PCA is patent. No aneurysm is identified. Venous sinuses: As permitted by contrast timing, patent. Anatomic variants: None of significance. Review of the MIP images confirms the above findings IMPRESSION: 1. Proximal right PCA occlusion. 2. Widely  patent carotid and vertebral arteries. 3. Aortic Atherosclerosis (ICD10-I70.0). Electronically Signed   By: Logan Bores M.D.   On: 04/09/2019 21:47   DG Elbow 2 Views Left  Result Date: 04/11/2019 CLINICAL DATA:  Pain with limited range of motion EXAM: LEFT ELBOW - 2 VIEW COMPARISON:  None. FINDINGS: No acute displaced fracture or malalignment. Prominent enthesophytes at the olecranon coronoid and trochlea. Prominent osteophytes at the radial head and neck. Suspected elbow effusion. Possible calcified loose body at the anterior joint space. Bony protuberance at the distal metaphysis of the humerus on the ulnar side, questionable for osteochondroma. IMPRESSION: 1. No acute osseous abnormality. 2. Suspected elbow effusion with possible calcified loose body anteriorly. Prominent enthesophytes at the elbow joint raise possibility of idiopathic skeletal hyperostosis. 3. Bony protuberance on the ulnar side of distal humerus, question osteochondroma.  Electronically Signed   By: Donavan Foil M.D.   On: 04/11/2019 16:14   DG Knee 1-2 Views Left  Result Date: 04/11/2019 CLINICAL DATA:  Pain with limited range of motion EXAM: LEFT KNEE - 1-2 VIEW COMPARISON:  None. FINDINGS: No fracture or malalignment. Mild vascular calcifications. Small knee effusion with multiple calcified suprapatellar loose bodies. Punctate opacity overlying the suprapatellar soft tissues may reflect a small foreign body. Advanced arthritis involving the medial and patellofemoral compartments with joint space narrowing and prominent osteophytosis. Degenerative changes of the lateral joint space with compensatory widening and osteophyte. Mild joint space calcification. IMPRESSION: 1. No acute osseous abnormality. 2. Advanced arthritis of the knee. Suprapatellar joint effusion with multiple calcified loose bodies 3. Possible small foreign body overlying the suprapatellar soft tissues Electronically Signed   By: Donavan Foil M.D.   On: 04/11/2019 16:16   DG Knee 1-2 Views Right  Result Date: 04/11/2019 CLINICAL DATA:  Knee pain EXAM: RIGHT KNEE - 1-2 VIEW COMPARISON:  None. FINDINGS: No fracture or malalignment. Tricompartment arthritis of the knee, severe involving the patellofemoral and medial joint spaces. Mild joint space calcification. Small knee effusion with small calcified suprapatellar loose bodies. Vascular calcifications. IMPRESSION: 1. No acute osseous abnormality. 2. Advanced tricompartment arthritis of the knee. Small suprapatellar effusion with small calcified loose bodies 3. Joint space calcifications as may be seen with chondrocalcinosis. Electronically Signed   By: Donavan Foil M.D.   On: 04/11/2019 16:17   DG Abd 1 View  Result Date: 04/09/2019 CLINICAL DATA:  NG tube placement EXAM: ABDOMEN - 1 VIEW COMPARISON:  April 09, 2019 FINDINGS: The NG tube again projects over the left upper quadrant with the side hole near the GE junction. The bowel gas pattern is  nonobstructive and nonspecific. There are degenerative changes of the lumbar spine. IMPRESSION: NG tube is unchanged in positioning. If possible, further advancement into the stomach is recommended. Electronically Signed   By: Constance Holster M.D.   On: 04/09/2019 19:11   CT Head Wo Contrast  Result Date: 04/09/2019 CLINICAL DATA:  Altered mental status with left-sided weakness over 24 hours. EXAM: CT HEAD WITHOUT CONTRAST TECHNIQUE: Contiguous axial images were obtained from the base of the skull through the vertex without intravenous contrast. COMPARISON:  CT head 04/03/2019 FINDINGS: Brain: New area of hypodensity in the right occipital lobe compatible with acute to subacute infarct. This was not seen previously. No associated hemorrhage. Mild atrophy. Mild chronic microvascular ischemic changes in the white matter. Vascular: Negative for hyperdense vessel Skull: The negative Sinuses/Orbits: Paranasal sinuses clear.  Bilateral ocular surgery. Other: None IMPRESSION: New area of hypodensity right occipital lobe compatible  with acute/subacute PCA infarct. No associated hemorrhage Mild atrophy and mild chronic microvascular ischemia. These results were called by telephone at the time of interpretation on 04/09/2019 at 9:12 am to provider Sabra Heck, who verbally acknowledged these results. Electronically Signed   By: Franchot Gallo M.D.   On: 04/09/2019 09:13   CT Head Wo Contrast  Result Date: 04/03/2019 CLINICAL DATA:  Neuro deficits.  Subacute left hand tingling. EXAM: CT HEAD WITHOUT CONTRAST TECHNIQUE: Contiguous axial images were obtained from the base of the skull through the vertex without intravenous contrast. COMPARISON:  April 11, 2010 FINDINGS: Brain: No subdural, epidural, or subarachnoid hemorrhage. Cerebellum, brainstem, and basal cisterns are normal. Ventricles and sulci are mildly prominent. White matter changes are identified. No acute cortical ischemia or infarct. No mass effect or midline  shift. Vascular: Calcified atherosclerosis is seen in the intracranial carotids. Skull: Normal. Negative for fracture or focal lesion. Sinuses/Orbits: No acute finding. Other: None. IMPRESSION: 1. Chronic white matter changes. No acute intracranial abnormalities. Electronically Signed   By: Dorise Bullion III M.D   On: 04/03/2019 10:41   CT ANGIO NECK W OR WO CONTRAST  Result Date: 04/09/2019 CLINICAL DATA:  Stroke follow-up. Large right PCA infarct. EXAM: CT ANGIOGRAPHY HEAD AND NECK TECHNIQUE: Multidetector CT imaging of the head and neck was performed using the standard protocol during bolus administration of intravenous contrast. Multiplanar CT image reconstructions and MIPs were obtained to evaluate the vascular anatomy. Carotid stenosis measurements (when applicable) are obtained utilizing NASCET criteria, using the distal internal carotid diameter as the denominator. CONTRAST:  70mL OMNIPAQUE IOHEXOL 350 MG/ML SOLN COMPARISON:  None. FINDINGS: CTA NECK FINDINGS Aortic arch: Standard 3 vessel aortic arch with mild atherosclerotic plaque. No arch vessel origin stenosis. Right carotid system: Patent with small volume soft plaque at the carotid bifurcation. No evidence of stenosis or dissection. Left carotid system: Patent with small volume soft plaque at the common carotid artery origin and carotid bifurcation. No evidence of stenosis or dissection. Tortuous mid cervical ICA. Vertebral arteries: Patent and codominant without evidence of significant stenosis or dissection. Skeleton: Advanced C1-2 arthropathy with multiple erosions in the dens. Advanced spondylosis with bulky anterior vertebral ossification extending from C2 into the included thoracic spine. Severe facet arthrosis at C7-T1 with grade 1 anterolisthesis. Severe multilevel neural foraminal stenosis. Other neck: No evidence of cervical lymphadenopathy or mass. Nasogastric tube forms a loop in the oropharynx. Upper chest: Clear lung apices. Review  of the MIP images confirms the above findings CTA HEAD FINDINGS Anterior circulation: The internal carotid arteries are patent from skull base to carotid termini with mild nonstenotic plaque bilaterally. ACAs and MCAs are patent without evidence of proximal branch occlusion or significant proximal stenosis. The left ACA is dominant. No aneurysm is identified. Posterior circulation: The intracranial vertebral arteries are patent to the basilar with atherosclerotic irregularity bilaterally but no significant stenosis. Patent left PICA, right AICA, and bilateral SCAs are visualized. The basilar artery is widely patent. There is occlusion of the right PCA at the P1 level. The left PCA is patent. No aneurysm is identified. Venous sinuses: As permitted by contrast timing, patent. Anatomic variants: None of significance. Review of the MIP images confirms the above findings IMPRESSION: 1. Proximal right PCA occlusion. 2. Widely patent carotid and vertebral arteries. 3. Aortic Atherosclerosis (ICD10-I70.0). Electronically Signed   By: Logan Bores M.D.   On: 04/09/2019 21:47   MR BRAIN WO CONTRAST  Result Date: 04/09/2019 CLINICAL DATA:  Right occipital infarct. EXAM:  MRI HEAD WITHOUT CONTRAST TECHNIQUE: Multiplanar, multiecho pulse sequences of the brain and surrounding structures were obtained without intravenous contrast. COMPARISON:  Head CT 04/09/2019 FINDINGS: Multiple sequences are up to moderately motion degraded. Brain: As seen on today's earlier CT, there is a large acute to early subacute right PCA infarct involving much of the right occipital lobe and portions of the posteromedial right temporal lobe and thalamus. There is cytotoxic edema with regional sulcal effacement and mild mass effect on the right lateral ventricle. No midline shift, intracranial hemorrhage, or extra-axial fluid collection is identified. Patchy T2 hyperintensities elsewhere in the cerebral white matter bilaterally are nonspecific but  compatible with moderate chronic small vessel ischemic disease. Mild cerebral atrophy is within normal limits for age. Vascular: Major intracranial vascular flow voids are preserved. Skull and upper cervical spine: No suspicious marrow lesion. Cervical spondylosis. Sinuses/Orbits: Right cataract extraction. Paranasal sinuses and mastoid air cells are clear. Other: None. IMPRESSION: 1. Large acute to early subacute right PCA infarct. 2. Moderate chronic small vessel ischemic disease. Electronically Signed   By: Logan Bores M.D.   On: 04/09/2019 17:50   CT SHOULDER LEFT WO CONTRAST  Result Date: 04/12/2019 CLINICAL DATA:  [place]. Left shoulder pain. EXAM: CT OF THE UPPER LEFT EXTREMITY WITHOUT CONTRAST TECHNIQUE: Multidetector CT imaging of the upper left extremity was performed according to the standard protocol. COMPARISON:  None. FINDINGS: Moderate glenohumeral and AC joint degenerative changes. No acute fracture of the humeral head or neck. No dislocation. Marked narrowing of the humeroacromial space most consistent with a full-thickness retracted supraspinatus tendon tear, likely chronic. The visualized left ribs are intact. The visualized left lung is grossly clear. IMPRESSION: 1. No acute fracture or dislocation. 2. Moderate glenohumeral and AC joint degenerative changes. 3. Full-thickness retracted supraspinatus tendon tear, likely chronic. Electronically Signed   By: Marijo Sanes M.D.   On: 04/12/2019 05:31   EP PPM/ICD IMPLANT  Result Date: 04/12/2019 CONCLUSIONS:  1. Successful implantation of a [employer] Reveal LINQ implantable loop recorder for cryptogenic stroke  2. No early apparent complications. Cristopher Peru, MD 04/12/2019 2:05 PM   DG Chest Portable 1 View  Result Date: 04/09/2019 CLINICAL DATA:  NG tube placement. EXAM: PORTABLE CHEST 1 VIEW COMPARISON:  April 09, 2019 FINDINGS: The heart size is mildly enlarged. The enteric tube tip projects over the gastric body. There is no  pneumothorax. No large pleural effusion. No evidence for focal infiltrate. IMPRESSION: No significant interval change in positioning of the NG tube. Consider further advancement with repeat radiograph to follow. Electronically Signed   By: Constance Holster M.D.   On: 04/09/2019 15:34   DG Chest Portable 1 View  Result Date: 04/09/2019 CLINICAL DATA:  Nasogastric tube placement EXAM: PORTABLE CHEST 1 VIEW COMPARISON:  April 03, 2019 FINDINGS: Feeding tube is identified with distal tip probably at the GE junction. Advancement is recommended. The mediastinal contour and cardiac silhouette are stable. The lungs are clear. IMPRESSION: Feeding tube is identified with distal tip probably at the GE junction. Advancement is recommended. Electronically Signed   By: Abelardo Diesel M.D.   On: 04/09/2019 13:44   DG Chest Portable 1 View  Result Date: 04/03/2019 CLINICAL DATA:  Atypical chest pain eval EXAM: PORTABLE CHEST 1 VIEW COMPARISON:  Chest radiograph 04/11/2010 FINDINGS: The cardiac silhouette appears enlarged which may be accentuated by AP technique. Mediastinal contours likely within normal limits given patient rotation. The lungs are clear. No pneumothorax or significant pleural effusion. No acute finding in  the visualized skeleton. IMPRESSION: Enlarged cardiac silhouette which may be accentuated by AP technique. No acute pulmonary finding. Electronically Signed   By: Audie Pinto M.D.   On: 04/03/2019 10:39   ECHOCARDIOGRAM COMPLETE  Result Date: 04/10/2019    ECHOCARDIOGRAM REPORT   Patient Name:   MATHAN DARROCH Date of Exam: 04/10/2019 Medical Rec #:  [id number]        Height:       72.0 in Accession #:    [id number]       Weight:       240.0 lb Date of Birth:  1935-09-14         BSA:          2.302 m Patient Age:    84 years         BP:           152/68 mmHg Patient Gender: M                HR:           68 bpm. Exam Location:  Inpatient Procedure: 2D Echo Indications:    stroke  History:         Patient has no prior history of Echocardiogram examinations.                 Risk Factors:Dyslipidemia and Hypertension.  Sonographer:    Johny Chess Referring Phys: [id number] COURAGE EMOKPAE  Sonographer Comments: Image acquisition challenging due to uncooperative patient. IMPRESSIONS  1. Left ventricular ejection fraction, by estimation, is 40 to 45%. The left ventricle has mildly decreased function. The left ventricle demonstrates global hypokinesis. The left ventricular internal cavity size was mildly dilated. Left ventricular diastolic parameters are consistent with Grade I diastolic dysfunction (impaired relaxation).  2. Right ventricular systolic function is normal. The right ventricular size is normal.  3. The mitral valve is grossly normal. Trivial mitral valve regurgitation. No evidence of mitral stenosis.  4. The aortic valve is normal in structure. Aortic valve regurgitation is not visualized. No aortic stenosis is present. FINDINGS  Left Ventricle: Left ventricular ejection fraction, by estimation, is 40 to 45%. The left ventricle has mildly decreased function. The left ventricle demonstrates global hypokinesis. The left ventricular internal cavity size was mildly dilated. There is  no left ventricular hypertrophy. Left ventricular diastolic parameters are consistent with Grade I diastolic dysfunction (impaired relaxation). Right Ventricle: The right ventricular size is normal. No increase in right ventricular wall thickness. Right ventricular systolic function is normal. Left Atrium: Left atrial size was normal in size. Right Atrium: Right atrial size was normal in size. Pericardium: There is no evidence of pericardial effusion. Mitral Valve: The mitral valve is grossly normal. Trivial mitral valve regurgitation. No evidence of mitral valve stenosis. Tricuspid Valve: The tricuspid valve is grossly normal. Tricuspid valve regurgitation is not demonstrated. No evidence of tricuspid stenosis. Aortic  Valve: The aortic valve is normal in structure. Aortic valve regurgitation is not visualized. No aortic stenosis is present. Pulmonic Valve: The pulmonic valve was grossly normal. Pulmonic valve regurgitation is not visualized. No evidence of pulmonic stenosis. Aorta: The aortic root and ascending aorta are structurally normal, with no evidence of dilitation. IAS/Shunts: The atrial septum is grossly normal.  LEFT VENTRICLE PLAX 2D LVIDd:         5.40 cm      Diastology LVIDs:         3.90 cm      LV e' lateral:  6.42 cm/s LV PW:         0.90 cm      LV E/e' lateral: 10.0 LV IVS:        1.00 cm      LV e' medial:    4.68 cm/s LVOT diam:     2.20 cm      LV E/e' medial:  13.7 LV SV:         67 LV SV Index:   29 LVOT Area:     3.80 cm  LV Volumes (MOD) LV vol d, MOD A2C: 129.0 ml LV vol d, MOD A4C: 122.0 ml LV vol s, MOD A2C: 69.2 ml LV vol s, MOD A4C: 73.6 ml LV SV MOD A2C:     59.8 ml LV SV MOD A4C:     122.0 ml LV SV MOD BP:      55.7 ml RIGHT VENTRICLE RV S prime:     16.20 cm/s TAPSE (M-mode): 2.2 cm LEFT ATRIUM             Index       RIGHT ATRIUM           Index LA diam:        3.10 cm 1.35 cm/m  RA Area:     14.20 cm LA Vol (A2C):   73.6 ml 31.97 ml/m RA Volume:   32.30 ml  14.03 ml/m LA Vol (A4C):   60.8 ml 26.41 ml/m LA Biplane Vol: 70.2 ml 30.49 ml/m  AORTIC VALVE LVOT Vmax:   92.70 cm/s LVOT Vmean:  60.200 cm/s LVOT VTI:    0.176 m MITRAL VALVE MV Area (PHT): 2.66 cm    SHUNTS MV Decel Time: 285 msec    Systemic VTI:  0.18 m MV E velocity: 64.30 cm/s  Systemic Diam: 2.20 cm MV A velocity: 87.40 cm/s MV E/A ratio:  0.74 Mertie Moores MD Electronically signed by Mertie Moores MD Signature Date/Time: 04/10/2019/3:12:04 PM    Final     Microbiology: Recent Results (from the past 240 hour(s))  SARS CORONAVIRUS 2 (TAT 6-24 HRS) Nasopharyngeal Nasopharyngeal Swab     Status: None   Collection Time: 04/09/19  7:25 AM   Specimen: Nasopharyngeal Swab  Result Value Ref Range Status   SARS Coronavirus  2 NEGATIVE NEGATIVE Final    Comment: (NOTE) SARS-CoV-2 target nucleic acids are NOT DETECTED. The SARS-CoV-2 RNA is generally detectable in upper and lower respiratory specimens during the acute phase of infection. Negative results do not preclude SARS-CoV-2 infection, do not rule out co-infections with other pathogens, and should not be used as the sole basis for treatment or other patient management decisions. Negative results must be combined with clinical observations, patient history, and epidemiological information. The expected result is Negative. Fact Sheet for Patients: SugarRoll.be Fact Sheet for Healthcare Providers: https://www.woods-mathews.com/ This test is not yet approved or cleared by the [place] FDA and  has been authorized for detection and/or diagnosis of SARS-CoV-2 by FDA under an Emergency Use Authorization (EUA). This EUA will remain  in effect (meaning this test can be used) for the duration of the COVID-19 declaration under Section 56 4(b)(1) of the Act, 21 U.S.C. section 360bbb-3(b)(1), unless the authorization is terminated or revoked sooner. Performed at [facility], [place] [address] [place] [facility] [place], [place] [address]   Body fluid culture     Status: None (Preliminary result)   Collection Time: 04/12/19  1:24 PM   Specimen: Body Fluid  Result Value Ref  Range Status   Specimen Description FLUID SYNOVIAL RIGHT KNEE  Final   Special Requests NONE  Final   Gram Stain   Final    ABUNDANT WBC PRESENT,BOTH PMN AND MONONUCLEAR NO ORGANISMS SEEN    Culture   Final    NO GROWTH < 24 HOURS Performed at [facility], [place] [address], [place], [place] [address]    Report Status PENDING  Incomplete     Labs: Basic Metabolic Panel: Recent Labs  Lab 04/09/19 0401 04/10/19 0320  NA 135 140  K 3.6 3.6  CL 101 103  CO2 25 22  GLUCOSE 122* 121*  BUN 22 21  CREATININE 1.38* 1.35*  CALCIUM 7.3* 7.5*  PHOS   --  3.3   Liver Function Tests: Recent Labs  Lab 04/09/19 0401 04/10/19 0320  AST 24  --   ALT 18  --   ALKPHOS 99  --   BILITOT 1.2  --   PROT 7.3  --   ALBUMIN 4.0 3.6   Recent Labs  Lab 04/09/19 0401  LIPASE 25   No results for input(s): AMMONIA in the last 168 hours. CBC: Recent Labs  Lab 04/09/19 0401 04/10/19 0320  WBC 10.5 12.6*  NEUTROABS 6.7  --   HGB 14.5 15.4  HCT 43.4 45.8  MCV 92.7 92.5  PLT 245 246   Cardiac Enzymes: No results for input(s): CKTOTAL, CKMB, CKMBINDEX, TROPONINI in the last 168 hours. BNP: BNP (last 3 results) No results for input(s): BNP in the last 8760 hours.  ProBNP (last 3 results) No results for input(s): PROBNP in the last 8760 hours.  CBG: Recent Labs  Lab 04/09/19 1530  GLUCAP 104*       Signed:  Florencia Reasons MD, PhD, FACP  [facility] 04/13/2019, 3:20 PM

## 2019-04-13 NOTE — Progress Notes (Signed)
Physical Therapy Treatment Patient Details Name: Curtis Jenkins MRN: [id number] DOB: 11-27-1935 Today's Date: 04/13/2019    History of Present Illness 84 y.o. male with   With PMH HTN, HLD who presented to [facility] for increased left side weakness, speech problems and dysphagia. transferred to [facility]. [place] revealed a right occipital lobe hypodensity.MRI shows a right PCA infarct.    PT Comments    Pt supine in bed on arrival.  He reports he feels much better this session after ortho removed fluid from his R knee and L shoulder and placed shots for pain.  He performed x2 standing trials with flexed posture.  Based on CIR denial will update recommendations to SNF at this time.  Pt is refusing placement and will require HHPT and equipment listed below.  He will also require non-emergent EMS transport home.    Follow Up Recommendations  SNF;Supervision/Assistance - 24 hour(he is refusing SNF so he will require HHPT at d/c.)     Equipment Recommendations  Rolling walker with 5" wheels;3in1 (PT);Wheelchair (measurements PT);Wheelchair cushion (measurements PT);Other (comment);Hospital bed    Recommendations for Other Services       Precautions / Restrictions Precautions Precautions: Fall Precaution Comments: L hemiparesis Restrictions Weight Bearing Restrictions: No    Mobility  Bed Mobility Overal bed mobility: Needs Assistance Bed Mobility: Supine to Sit;Sit to Supine     Supine to sit: Max assist;+2 for physical assistance     General bed mobility comments: Max +2 to move B LEs to edge of bed and advance hips to edge of bed with bed pad.  Once lower half of body in place, +2 assistance to move trunk into a seated position.  Transfers Overall transfer level: Needs assistance Equipment used: Ambulation equipment used(sara stedy) Transfers: Sit to/from Stand Sit to Stand: +2 physical assistance;Max assist         General transfer comment: Pt required physical assistance for foot  placement and to move knee closer to mid line as he presents with a wide BOS.  He required +2 mas to rise into standing but lacks knee, hip and trunk extension.  Ambulation/Gait Ambulation/Gait assistance: (NT)               Stairs             Wheelchair Mobility    Modified Rankin (Stroke Patients Only)       Balance Overall balance assessment: Needs assistance Sitting-balance support: Feet supported;No upper extremity supported Sitting balance-Leahy Scale: Fair       Standing balance-Leahy Scale: Poor Standing balance comment: Heavy L lean in standing                            Cognition Arousal/Alertness: Awake/alert Behavior During Therapy: WFL for tasks assessed/performed Overall Cognitive Status: Impaired/Different from baseline Area of Impairment: Orientation;Attention;Safety/judgement;Awareness;Problem solving;Following commands                 Orientation Level: Disoriented to;Time Current Attention Level: Sustained   Following Commands: Follows one step commands with increased time Safety/Judgement: Decreased awareness of safety;Decreased awareness of deficits Awareness: Intellectual Problem Solving: Slow processing;Difficulty sequencing;Requires verbal cues;Requires tactile cues General Comments: Pt very much wants to be in control of his movements and required max coaxing to participate in a safe proper manner.      Exercises      General Comments        Pertinent Vitals/Pain Pain Assessment: Faces Faces Pain  Scale: Hurts little more Pain Location: reports decreased pain in R knee and L shoulder but remains to complain of joint pain. Pain Descriptors / Indicators: Discomfort;Sore Pain Intervention(s): Monitored during session;Repositioned    Home Living                      Prior Function            PT Goals (current goals can now be found in the care plan section) Acute Rehab PT Goals Patient Stated  Goal: To get his bed changed and reduce pain Potential to Achieve Goals: Fair Progress towards PT goals: Progressing toward goals    Frequency    Min 4X/week      PT Plan Discharge plan needs to be updated    Co-evaluation PT/OT/SLP Co-Evaluation/Treatment: Yes Reason for Co-Treatment: Complexity of the patient's impairments (multi-system involvement)   OT goals addressed during session: ADL's and self-care      AM-PAC PT "6 Clicks" Mobility   Outcome Measure  Help needed turning from your back to your side while in a flat bed without using bedrails?: Total Help needed moving from lying on your back to sitting on the side of a flat bed without using bedrails?: Total Help needed moving to and from a bed to a chair (including a wheelchair)?: Total Help needed standing up from a chair using your arms (e.g., wheelchair or bedside chair)?: Total Help needed to walk in hospital room?: Total Help needed climbing 3-5 steps with a railing? : Total 6 Click Score: 6    End of Session Equipment Utilized During Treatment: Gait belt Activity Tolerance: Patient tolerated treatment well Patient left: with chair alarm set;in bed;with bed alarm set(in chair position) Nurse Communication: Mobility status PT Visit Diagnosis: Other abnormalities of gait and mobility (R26.89);Hemiplegia and hemiparesis Hemiplegia - Right/Left: Left Hemiplegia - dominant/non-dominant: Non-dominant Hemiplegia - caused by: Cerebral infarction     Time: 1829-9371 PT Time Calculation (min) (ACUTE ONLY): 34 min  Charges:  $Therapeutic Activity: 8-22 mins                     Erasmo Leventhal , PTA Acute Rehabilitation Services Pager [phone] Office [phone]     Valla Pacey Eli Hose 04/13/2019, 12:55 PM

## 2019-04-13 NOTE — Progress Notes (Addendum)
Physical Therapy Treatment Patient Details Name: Curtis Jenkins MRN: [id number] DOB: [birth year] Today's Date: 04/13/2019    History of Present Illness 84 y.o. male with   With PMH HTN, HLD who presented to [facility] for increased left side weakness, speech problems and dysphagia. transferred to [facility]. [place] revealed a right occipital lobe hypodensity.MRI shows a right PCA infarct.    PT Comments    NT called PTA to room to assist with back to bed transfer.  He continues to require Max +2 to rise into standing with in sara stedy frame.  Pt continues to benefit from snf but is refusing placement and will require HHPT and equipment listed below.     Follow Up Recommendations  SNF;Supervision/Assistance - 24 hour     Equipment Recommendations  Rolling walker with 5" wheels;3in1 (PT);Wheelchair (measurements PT);Wheelchair cushion (measurements PT);Other (comment);Hospital bed    Recommendations for Other Services       Precautions / Restrictions Precautions Precautions: Fall Precaution Comments: L hemiparesis Restrictions Weight Bearing Restrictions: No    Mobility  Bed Mobility Overal bed mobility: Needs Assistance Bed Mobility: Sit to Supine      Sit to supine: Total assist;+2 for physical assistance   General bed mobility comments: Total +2 to move back to bed against gravity.  Performed rolling to R and L to place chux pads.  Transfers Overall transfer level: Needs assistance Equipment used: Ambulation equipment used(sara stedy) Transfers: Sit to/from Stand Sit to Stand: Max assist;+2 physical assistance         General transfer comment: Pt required assistance to move to edge of recliner to prepare for transfer back to bed.  He utilized the sara stedy and PTA and NT used bed pad to boost into standing.,  Ambulation/Gait Ambulation/Gait assistance: (NT)               Stairs             Wheelchair Mobility    Modified Rankin (Stroke Patients Only)       Balance Overall balance assessment: Needs assistance Sitting-balance support: Feet supported;No upper extremity supported Sitting balance-Leahy Scale: Fair       Standing balance-Leahy Scale: Poor Standing balance comment: Heavy L lean in standing                            Cognition Arousal/Alertness: Awake/alert Behavior During Therapy: WFL for tasks assessed/performed Overall Cognitive Status: Impaired/Different from baseline Area of Impairment: Orientation;Attention;Safety/judgement;Awareness;Problem solving;Following commands                 Orientation Level: Disoriented to;Time Current Attention Level: Sustained   Following Commands: Follows one step commands with increased time Safety/Judgement: Decreased awareness of safety;Decreased awareness of deficits Awareness: Intellectual Problem Solving: Slow processing;Difficulty sequencing;Requires verbal cues;Requires tactile cues General Comments: Pt very much wants to be in control of his movements and required max coaxing to participate in a safe proper manner.      Exercises      General Comments        Pertinent Vitals/Pain Pain Assessment: Faces Faces Pain Scale: Hurts little more Pain Location: reports decreased pain in R knee and L shoulder but remains to complain of joint pain. Pain Descriptors / Indicators: Discomfort;Sore Pain Intervention(s): Limited activity within patient's tolerance    Home Living                      Prior Function  PT Goals (current goals can now be found in the care plan section) Acute Rehab PT Goals Patient Stated Goal: To get back to bed Potential to Achieve Goals: Fair Progress towards PT goals: Progressing toward goals    Frequency    Min 4X/week      PT Plan Current plan remains appropriate    Co-evaluation PT/OT/SLP Co-Evaluation/Treatment: Yes Reason for Co-Treatment: Complexity of the patient's impairments  (multi-system involvement)   OT goals addressed during session: ADL's and self-care      AM-PAC PT "6 Clicks" Mobility   Outcome Measure  Help needed turning from your back to your side while in a flat bed without using bedrails?: Total Help needed moving from lying on your back to sitting on the side of a flat bed without using bedrails?: Total Help needed moving to and from a bed to a chair (including a wheelchair)?: Total Help needed standing up from a chair using your arms (e.g., wheelchair or bedside chair)?: Total Help needed to walk in hospital room?: Total Help needed climbing 3-5 steps with a railing? : Total 6 Click Score: 6    End of Session Equipment Utilized During Treatment: Gait belt Activity Tolerance: Patient tolerated treatment well Patient left: in bed;with bed alarm set;with call bell/phone within reach Nurse Communication: Mobility status PT Visit Diagnosis: Other abnormalities of gait and mobility (R26.89);Hemiplegia and hemiparesis Hemiplegia - Right/Left: Left Hemiplegia - dominant/non-dominant: Non-dominant Hemiplegia - caused by: Cerebral infarction     Time: 8329-1916 PT Time Calculation (min) (ACUTE ONLY): 11 min  Charges:  $Therapeutic Activity: 8-22 mins                     Erasmo Leventhal , PTA Acute Rehabilitation Services Pager [phone] Office [phone]     Edsel Shives Eli Hose 04/13/2019, 1:34 PM

## 2019-04-13 NOTE — TOC Transition Note (Signed)
Transition of Care [facility]) - CM/SW Discharge Note   Patient Details  Name: Curtis Jenkins MRN: [id number] Date of Birth: October 28, 1935  Transition of Care [facility]) CM/SW Contact:  Curtis Friar, RN Phone Number: 04/13/2019, 3:30 PM   Clinical Narrative:    CM spoke to Curtis Jenkins (daughter) over the phone. Curtis Jenkins states [facility] is on the way to deliver the DME. CM has arranged transport home via [facility] with a transport time of 4:30 pm.  Cassie with [facility] is aware of d/c.  D/c packet at the desk and bedside RN updated.    Final next level of care: Home w Home Health Services Barriers to Discharge: No Barriers Identified   Patient Goals and CMS Choice   CMS Medicare.gov Compare Post Acute Care list provided to:: Patient Represenative (must comment) Choice offered to / list presented to : Adult Children  Discharge Placement                  Name of family member notified: Curtis Jenkins    Discharge Plan and Services   Discharge Planning Services: CM Consult Post Acute Care Choice: Home Health, Durable Medical Equipment          DME Arranged: Hospital bed, Wheelchair manual, Other see comment(hoyer) DME Agency: [facility] Date DME Agency Contacted: 04/12/19   Representative spoke with at DME Agency: Information faxed to [facility] for them to review. HH Arranged: PT, OT, Speech Therapy HH Agency: [facility] Date [place]: 04/12/19   Representative spoke with at [place]: Cassie  Social Determinants of Health ([place]) Interventions     Readmission Risk Interventions No flowsheet data found.

## 2019-04-14 ENCOUNTER — Telehealth: Payer: Self-pay | Admitting: Emergency Medicine

## 2019-04-14 DIAGNOSIS — M6281 Muscle weakness (generalized): Secondary | ICD-10-CM | POA: Diagnosis not present

## 2019-04-14 DIAGNOSIS — R262 Difficulty in walking, not elsewhere classified: Secondary | ICD-10-CM | POA: Diagnosis not present

## 2019-04-14 DIAGNOSIS — I69391 Dysphagia following cerebral infarction: Secondary | ICD-10-CM | POA: Diagnosis not present

## 2019-04-14 DIAGNOSIS — M25422 Effusion, left elbow: Secondary | ICD-10-CM | POA: Diagnosis not present

## 2019-04-14 DIAGNOSIS — E785 Hyperlipidemia, unspecified: Secondary | ICD-10-CM | POA: Diagnosis not present

## 2019-04-14 DIAGNOSIS — N182 Chronic kidney disease, stage 2 (mild): Secondary | ICD-10-CM | POA: Diagnosis not present

## 2019-04-14 DIAGNOSIS — M159 Polyosteoarthritis, unspecified: Secondary | ICD-10-CM | POA: Diagnosis not present

## 2019-04-14 DIAGNOSIS — I69954 Hemiplegia and hemiparesis following unspecified cerebrovascular disease affecting left non-dominant side: Secondary | ICD-10-CM | POA: Diagnosis not present

## 2019-04-14 DIAGNOSIS — I10 Essential (primary) hypertension: Secondary | ICD-10-CM | POA: Diagnosis not present

## 2019-04-14 DIAGNOSIS — I63531 Cerebral infarction due to unspecified occlusion or stenosis of right posterior cerebral artery: Secondary | ICD-10-CM | POA: Diagnosis not present

## 2019-04-14 NOTE — Telephone Encounter (Addendum)
LINQ II placed on 04/12/19 after hospital admission for stroke. Alert received today for 10 AF episodes that occurred on 04/13/19, longest episode was 10 minutes. No OAC . Patient was discharged on 04/13/19 home. Wound check scheduled for 04/21/19. No DPR on file spoke to wife Edd Fabian with patient permission and patient reports no symptoms during the day 04/13/19. Patient unable to walk as result of stroke. AF Clinic may not be possible for patient due to difficulty getting to appointments.

## 2019-04-14 NOTE — Telephone Encounter (Signed)
I'd like to review these in person. GT

## 2019-04-15 DIAGNOSIS — M159 Polyosteoarthritis, unspecified: Secondary | ICD-10-CM | POA: Diagnosis not present

## 2019-04-15 DIAGNOSIS — M6281 Muscle weakness (generalized): Secondary | ICD-10-CM | POA: Diagnosis not present

## 2019-04-15 DIAGNOSIS — I69954 Hemiplegia and hemiparesis following unspecified cerebrovascular disease affecting left non-dominant side: Secondary | ICD-10-CM | POA: Diagnosis not present

## 2019-04-15 DIAGNOSIS — N182 Chronic kidney disease, stage 2 (mild): Secondary | ICD-10-CM | POA: Diagnosis not present

## 2019-04-15 DIAGNOSIS — R262 Difficulty in walking, not elsewhere classified: Secondary | ICD-10-CM | POA: Diagnosis not present

## 2019-04-15 DIAGNOSIS — M25422 Effusion, left elbow: Secondary | ICD-10-CM | POA: Diagnosis not present

## 2019-04-15 DIAGNOSIS — E785 Hyperlipidemia, unspecified: Secondary | ICD-10-CM | POA: Diagnosis not present

## 2019-04-15 DIAGNOSIS — I10 Essential (primary) hypertension: Secondary | ICD-10-CM | POA: Diagnosis not present

## 2019-04-15 DIAGNOSIS — I69391 Dysphagia following cerebral infarction: Secondary | ICD-10-CM | POA: Diagnosis not present

## 2019-04-15 DIAGNOSIS — I63531 Cerebral infarction due to unspecified occlusion or stenosis of right posterior cerebral artery: Secondary | ICD-10-CM | POA: Diagnosis not present

## 2019-04-15 LAB — BODY FLUID CULTURE: Culture: NO GROWTH

## 2019-04-15 NOTE — Telephone Encounter (Signed)
Called patient and informed him that per Dr. Lovena Le he noted transmission alert appears SR w/ ectopy. Continue taking ADA 81 mg and Plavix 75 mg. Patient advised if he has any concerns or questions to please give [place] a call at the DC. Direct phone number provided. Patient verbalized understanding.

## 2019-04-18 DIAGNOSIS — M159 Polyosteoarthritis, unspecified: Secondary | ICD-10-CM | POA: Diagnosis not present

## 2019-04-18 DIAGNOSIS — I69954 Hemiplegia and hemiparesis following unspecified cerebrovascular disease affecting left non-dominant side: Secondary | ICD-10-CM | POA: Diagnosis not present

## 2019-04-18 DIAGNOSIS — M25422 Effusion, left elbow: Secondary | ICD-10-CM | POA: Diagnosis not present

## 2019-04-18 DIAGNOSIS — N182 Chronic kidney disease, stage 2 (mild): Secondary | ICD-10-CM | POA: Diagnosis not present

## 2019-04-18 DIAGNOSIS — M6281 Muscle weakness (generalized): Secondary | ICD-10-CM | POA: Diagnosis not present

## 2019-04-18 DIAGNOSIS — I63531 Cerebral infarction due to unspecified occlusion or stenosis of right posterior cerebral artery: Secondary | ICD-10-CM | POA: Diagnosis not present

## 2019-04-18 DIAGNOSIS — I10 Essential (primary) hypertension: Secondary | ICD-10-CM | POA: Diagnosis not present

## 2019-04-18 DIAGNOSIS — R262 Difficulty in walking, not elsewhere classified: Secondary | ICD-10-CM | POA: Diagnosis not present

## 2019-04-18 DIAGNOSIS — I69391 Dysphagia following cerebral infarction: Secondary | ICD-10-CM | POA: Diagnosis not present

## 2019-04-18 DIAGNOSIS — E785 Hyperlipidemia, unspecified: Secondary | ICD-10-CM | POA: Diagnosis not present

## 2019-04-20 DIAGNOSIS — I69391 Dysphagia following cerebral infarction: Secondary | ICD-10-CM | POA: Diagnosis not present

## 2019-04-20 DIAGNOSIS — I63531 Cerebral infarction due to unspecified occlusion or stenosis of right posterior cerebral artery: Secondary | ICD-10-CM | POA: Diagnosis not present

## 2019-04-20 DIAGNOSIS — I69954 Hemiplegia and hemiparesis following unspecified cerebrovascular disease affecting left non-dominant side: Secondary | ICD-10-CM | POA: Diagnosis not present

## 2019-04-20 DIAGNOSIS — M25422 Effusion, left elbow: Secondary | ICD-10-CM | POA: Diagnosis not present

## 2019-04-20 DIAGNOSIS — R262 Difficulty in walking, not elsewhere classified: Secondary | ICD-10-CM | POA: Diagnosis not present

## 2019-04-20 DIAGNOSIS — M6281 Muscle weakness (generalized): Secondary | ICD-10-CM | POA: Diagnosis not present

## 2019-04-20 DIAGNOSIS — I10 Essential (primary) hypertension: Secondary | ICD-10-CM | POA: Diagnosis not present

## 2019-04-20 DIAGNOSIS — E785 Hyperlipidemia, unspecified: Secondary | ICD-10-CM | POA: Diagnosis not present

## 2019-04-20 DIAGNOSIS — N182 Chronic kidney disease, stage 2 (mild): Secondary | ICD-10-CM | POA: Diagnosis not present

## 2019-04-20 DIAGNOSIS — M159 Polyosteoarthritis, unspecified: Secondary | ICD-10-CM | POA: Diagnosis not present

## 2019-04-21 ENCOUNTER — Ambulatory Visit: Payer: Medicare Other

## 2019-04-21 DIAGNOSIS — I63531 Cerebral infarction due to unspecified occlusion or stenosis of right posterior cerebral artery: Secondary | ICD-10-CM | POA: Diagnosis not present

## 2019-04-21 DIAGNOSIS — E785 Hyperlipidemia, unspecified: Secondary | ICD-10-CM | POA: Diagnosis not present

## 2019-04-21 DIAGNOSIS — M6281 Muscle weakness (generalized): Secondary | ICD-10-CM | POA: Diagnosis not present

## 2019-04-21 DIAGNOSIS — I10 Essential (primary) hypertension: Secondary | ICD-10-CM | POA: Diagnosis not present

## 2019-04-21 DIAGNOSIS — M159 Polyosteoarthritis, unspecified: Secondary | ICD-10-CM | POA: Diagnosis not present

## 2019-04-21 DIAGNOSIS — I69391 Dysphagia following cerebral infarction: Secondary | ICD-10-CM | POA: Diagnosis not present

## 2019-04-21 DIAGNOSIS — N182 Chronic kidney disease, stage 2 (mild): Secondary | ICD-10-CM | POA: Diagnosis not present

## 2019-04-21 DIAGNOSIS — R262 Difficulty in walking, not elsewhere classified: Secondary | ICD-10-CM | POA: Diagnosis not present

## 2019-04-21 DIAGNOSIS — M25422 Effusion, left elbow: Secondary | ICD-10-CM | POA: Diagnosis not present

## 2019-04-21 DIAGNOSIS — I69954 Hemiplegia and hemiparesis following unspecified cerebrovascular disease affecting left non-dominant side: Secondary | ICD-10-CM | POA: Diagnosis not present

## 2019-04-22 DIAGNOSIS — M25422 Effusion, left elbow: Secondary | ICD-10-CM | POA: Diagnosis not present

## 2019-04-22 DIAGNOSIS — M159 Polyosteoarthritis, unspecified: Secondary | ICD-10-CM | POA: Diagnosis not present

## 2019-04-22 DIAGNOSIS — N182 Chronic kidney disease, stage 2 (mild): Secondary | ICD-10-CM | POA: Diagnosis not present

## 2019-04-22 DIAGNOSIS — E785 Hyperlipidemia, unspecified: Secondary | ICD-10-CM | POA: Diagnosis not present

## 2019-04-22 DIAGNOSIS — M6281 Muscle weakness (generalized): Secondary | ICD-10-CM | POA: Diagnosis not present

## 2019-04-22 DIAGNOSIS — R262 Difficulty in walking, not elsewhere classified: Secondary | ICD-10-CM | POA: Diagnosis not present

## 2019-04-22 DIAGNOSIS — I10 Essential (primary) hypertension: Secondary | ICD-10-CM | POA: Diagnosis not present

## 2019-04-22 DIAGNOSIS — I69391 Dysphagia following cerebral infarction: Secondary | ICD-10-CM | POA: Diagnosis not present

## 2019-04-22 DIAGNOSIS — I69954 Hemiplegia and hemiparesis following unspecified cerebrovascular disease affecting left non-dominant side: Secondary | ICD-10-CM | POA: Diagnosis not present

## 2019-04-22 DIAGNOSIS — I63531 Cerebral infarction due to unspecified occlusion or stenosis of right posterior cerebral artery: Secondary | ICD-10-CM | POA: Diagnosis not present

## 2019-04-25 DIAGNOSIS — L8991 Pressure ulcer of unspecified site, stage 1: Secondary | ICD-10-CM | POA: Diagnosis not present

## 2019-04-25 DIAGNOSIS — I63531 Cerebral infarction due to unspecified occlusion or stenosis of right posterior cerebral artery: Secondary | ICD-10-CM | POA: Diagnosis not present

## 2019-04-25 DIAGNOSIS — I639 Cerebral infarction, unspecified: Secondary | ICD-10-CM | POA: Diagnosis not present

## 2019-04-25 DIAGNOSIS — I10 Essential (primary) hypertension: Secondary | ICD-10-CM | POA: Diagnosis not present

## 2019-04-25 DIAGNOSIS — Z1389 Encounter for screening for other disorder: Secondary | ICD-10-CM | POA: Diagnosis not present

## 2019-04-25 DIAGNOSIS — E119 Type 2 diabetes mellitus without complications: Secondary | ICD-10-CM | POA: Diagnosis not present

## 2019-04-25 DIAGNOSIS — E785 Hyperlipidemia, unspecified: Secondary | ICD-10-CM | POA: Diagnosis not present

## 2019-04-25 DIAGNOSIS — M159 Polyosteoarthritis, unspecified: Secondary | ICD-10-CM | POA: Diagnosis not present

## 2019-04-25 DIAGNOSIS — Z0001 Encounter for general adult medical examination with abnormal findings: Secondary | ICD-10-CM | POA: Diagnosis not present

## 2019-04-25 DIAGNOSIS — M6281 Muscle weakness (generalized): Secondary | ICD-10-CM | POA: Diagnosis not present

## 2019-04-25 DIAGNOSIS — G589 Mononeuropathy, unspecified: Secondary | ICD-10-CM | POA: Diagnosis not present

## 2019-04-25 DIAGNOSIS — I69391 Dysphagia following cerebral infarction: Secondary | ICD-10-CM | POA: Diagnosis not present

## 2019-04-25 DIAGNOSIS — N182 Chronic kidney disease, stage 2 (mild): Secondary | ICD-10-CM | POA: Diagnosis not present

## 2019-04-25 DIAGNOSIS — R262 Difficulty in walking, not elsewhere classified: Secondary | ICD-10-CM | POA: Diagnosis not present

## 2019-04-25 DIAGNOSIS — M25422 Effusion, left elbow: Secondary | ICD-10-CM | POA: Diagnosis not present

## 2019-04-25 DIAGNOSIS — I69954 Hemiplegia and hemiparesis following unspecified cerebrovascular disease affecting left non-dominant side: Secondary | ICD-10-CM | POA: Diagnosis not present

## 2019-04-27 DIAGNOSIS — I10 Essential (primary) hypertension: Secondary | ICD-10-CM | POA: Diagnosis not present

## 2019-04-27 DIAGNOSIS — M6281 Muscle weakness (generalized): Secondary | ICD-10-CM | POA: Diagnosis not present

## 2019-04-27 DIAGNOSIS — M159 Polyosteoarthritis, unspecified: Secondary | ICD-10-CM | POA: Diagnosis not present

## 2019-04-27 DIAGNOSIS — N182 Chronic kidney disease, stage 2 (mild): Secondary | ICD-10-CM | POA: Diagnosis not present

## 2019-04-27 DIAGNOSIS — I63531 Cerebral infarction due to unspecified occlusion or stenosis of right posterior cerebral artery: Secondary | ICD-10-CM | POA: Diagnosis not present

## 2019-04-27 DIAGNOSIS — M25422 Effusion, left elbow: Secondary | ICD-10-CM | POA: Diagnosis not present

## 2019-04-27 DIAGNOSIS — R262 Difficulty in walking, not elsewhere classified: Secondary | ICD-10-CM | POA: Diagnosis not present

## 2019-04-27 DIAGNOSIS — E785 Hyperlipidemia, unspecified: Secondary | ICD-10-CM | POA: Diagnosis not present

## 2019-04-27 DIAGNOSIS — I69954 Hemiplegia and hemiparesis following unspecified cerebrovascular disease affecting left non-dominant side: Secondary | ICD-10-CM | POA: Diagnosis not present

## 2019-04-27 DIAGNOSIS — I69391 Dysphagia following cerebral infarction: Secondary | ICD-10-CM | POA: Diagnosis not present

## 2019-04-28 DIAGNOSIS — I69954 Hemiplegia and hemiparesis following unspecified cerebrovascular disease affecting left non-dominant side: Secondary | ICD-10-CM | POA: Diagnosis not present

## 2019-04-28 DIAGNOSIS — I10 Essential (primary) hypertension: Secondary | ICD-10-CM | POA: Diagnosis not present

## 2019-04-28 DIAGNOSIS — E785 Hyperlipidemia, unspecified: Secondary | ICD-10-CM | POA: Diagnosis not present

## 2019-04-28 DIAGNOSIS — M159 Polyosteoarthritis, unspecified: Secondary | ICD-10-CM | POA: Diagnosis not present

## 2019-04-29 DIAGNOSIS — I10 Essential (primary) hypertension: Secondary | ICD-10-CM | POA: Diagnosis not present

## 2019-04-29 DIAGNOSIS — M6281 Muscle weakness (generalized): Secondary | ICD-10-CM | POA: Diagnosis not present

## 2019-04-29 DIAGNOSIS — M25422 Effusion, left elbow: Secondary | ICD-10-CM | POA: Diagnosis not present

## 2019-04-29 DIAGNOSIS — E785 Hyperlipidemia, unspecified: Secondary | ICD-10-CM | POA: Diagnosis not present

## 2019-04-29 DIAGNOSIS — I63531 Cerebral infarction due to unspecified occlusion or stenosis of right posterior cerebral artery: Secondary | ICD-10-CM | POA: Diagnosis not present

## 2019-04-29 DIAGNOSIS — M159 Polyosteoarthritis, unspecified: Secondary | ICD-10-CM | POA: Diagnosis not present

## 2019-04-29 DIAGNOSIS — I69391 Dysphagia following cerebral infarction: Secondary | ICD-10-CM | POA: Diagnosis not present

## 2019-04-29 DIAGNOSIS — R262 Difficulty in walking, not elsewhere classified: Secondary | ICD-10-CM | POA: Diagnosis not present

## 2019-04-29 DIAGNOSIS — N182 Chronic kidney disease, stage 2 (mild): Secondary | ICD-10-CM | POA: Diagnosis not present

## 2019-04-29 DIAGNOSIS — I69954 Hemiplegia and hemiparesis following unspecified cerebrovascular disease affecting left non-dominant side: Secondary | ICD-10-CM | POA: Diagnosis not present

## 2019-05-02 DIAGNOSIS — M25422 Effusion, left elbow: Secondary | ICD-10-CM | POA: Diagnosis not present

## 2019-05-02 DIAGNOSIS — R262 Difficulty in walking, not elsewhere classified: Secondary | ICD-10-CM | POA: Diagnosis not present

## 2019-05-02 DIAGNOSIS — I69391 Dysphagia following cerebral infarction: Secondary | ICD-10-CM | POA: Diagnosis not present

## 2019-05-02 DIAGNOSIS — M159 Polyosteoarthritis, unspecified: Secondary | ICD-10-CM | POA: Diagnosis not present

## 2019-05-02 DIAGNOSIS — M6281 Muscle weakness (generalized): Secondary | ICD-10-CM | POA: Diagnosis not present

## 2019-05-02 DIAGNOSIS — I10 Essential (primary) hypertension: Secondary | ICD-10-CM | POA: Diagnosis not present

## 2019-05-02 DIAGNOSIS — E785 Hyperlipidemia, unspecified: Secondary | ICD-10-CM | POA: Diagnosis not present

## 2019-05-02 DIAGNOSIS — I69954 Hemiplegia and hemiparesis following unspecified cerebrovascular disease affecting left non-dominant side: Secondary | ICD-10-CM | POA: Diagnosis not present

## 2019-05-02 DIAGNOSIS — N182 Chronic kidney disease, stage 2 (mild): Secondary | ICD-10-CM | POA: Diagnosis not present

## 2019-05-02 DIAGNOSIS — I63531 Cerebral infarction due to unspecified occlusion or stenosis of right posterior cerebral artery: Secondary | ICD-10-CM | POA: Diagnosis not present

## 2019-05-04 DIAGNOSIS — L8990 Pressure ulcer of unspecified site, unspecified stage: Secondary | ICD-10-CM | POA: Diagnosis not present

## 2019-05-04 DIAGNOSIS — I10 Essential (primary) hypertension: Secondary | ICD-10-CM | POA: Diagnosis not present

## 2019-05-04 DIAGNOSIS — K625 Hemorrhage of anus and rectum: Secondary | ICD-10-CM | POA: Diagnosis not present

## 2019-05-04 DIAGNOSIS — L0390 Cellulitis, unspecified: Secondary | ICD-10-CM | POA: Diagnosis not present

## 2019-05-04 DIAGNOSIS — E785 Hyperlipidemia, unspecified: Secondary | ICD-10-CM | POA: Diagnosis not present

## 2019-05-04 DIAGNOSIS — K5900 Constipation, unspecified: Secondary | ICD-10-CM | POA: Diagnosis not present

## 2019-05-04 DIAGNOSIS — M6281 Muscle weakness (generalized): Secondary | ICD-10-CM | POA: Diagnosis not present

## 2019-05-04 DIAGNOSIS — R262 Difficulty in walking, not elsewhere classified: Secondary | ICD-10-CM | POA: Diagnosis not present

## 2019-05-04 DIAGNOSIS — I69391 Dysphagia following cerebral infarction: Secondary | ICD-10-CM | POA: Diagnosis not present

## 2019-05-04 DIAGNOSIS — I63531 Cerebral infarction due to unspecified occlusion or stenosis of right posterior cerebral artery: Secondary | ICD-10-CM | POA: Diagnosis not present

## 2019-05-04 DIAGNOSIS — G8312 Monoplegia of lower limb affecting left dominant side: Secondary | ICD-10-CM | POA: Diagnosis not present

## 2019-05-04 DIAGNOSIS — M25422 Effusion, left elbow: Secondary | ICD-10-CM | POA: Diagnosis not present

## 2019-05-04 DIAGNOSIS — M159 Polyosteoarthritis, unspecified: Secondary | ICD-10-CM | POA: Diagnosis not present

## 2019-05-04 DIAGNOSIS — I69954 Hemiplegia and hemiparesis following unspecified cerebrovascular disease affecting left non-dominant side: Secondary | ICD-10-CM | POA: Diagnosis not present

## 2019-05-04 DIAGNOSIS — N182 Chronic kidney disease, stage 2 (mild): Secondary | ICD-10-CM | POA: Diagnosis not present

## 2019-05-04 DIAGNOSIS — N390 Urinary tract infection, site not specified: Secondary | ICD-10-CM | POA: Diagnosis not present

## 2019-05-05 ENCOUNTER — Telehealth (INDEPENDENT_AMBULATORY_CARE_PROVIDER_SITE_OTHER): Payer: Self-pay | Admitting: Student

## 2019-05-05 ENCOUNTER — Other Ambulatory Visit: Payer: Self-pay

## 2019-05-05 ENCOUNTER — Ambulatory Visit: Payer: Medicare Other

## 2019-05-05 DIAGNOSIS — I480 Paroxysmal atrial fibrillation: Secondary | ICD-10-CM | POA: Insufficient documentation

## 2019-05-05 DIAGNOSIS — I63531 Cerebral infarction due to unspecified occlusion or stenosis of right posterior cerebral artery: Secondary | ICD-10-CM

## 2019-05-05 LAB — CUP PACEART INCLINIC DEVICE CHECK
Date Time Interrogation Session: 20210422103303
Implantable Pulse Generator Implant Date: 20210330

## 2019-05-05 MED ORDER — APIXABAN 5 MG PO TABS: 5.0000 mg | ORAL_TABLET | Freq: Two times a day (BID) | ORAL | 6 refills | Status: DC

## 2019-05-05 NOTE — Progress Notes (Signed)
Virtual ILR wound check. Steri strips removed. Wound well healed. Home monitor with orange blinking light. Unplugged for 30 seconds and green light. Transmission received.   10 additional AF episodes. No other episode types. EGMs appear NSR with frequent ectopy. One episode 1 hr 38 minutes with rates 120-160s, shows true AF. All others 2-4 minutes and appear NSR with ectopy.   Will start on Eliquis 5 mg BID and keep 2 week in office follow up for repeat blood work.   Poor resolution on screenshot, but no drainage or bleeding, wound closed and well healed per daughter and this provider's visualization during video call.

## 2019-05-06 ENCOUNTER — Emergency Department (HOSPITAL_COMMUNITY)
Admission: EM | Admit: 2019-05-06 | Discharge: 2019-05-06 | Disposition: A | Discharge status: Home / Self Care | Payer: Medicare Other | Attending: Emergency Medicine | Admitting: Emergency Medicine

## 2019-05-06 ENCOUNTER — Other Ambulatory Visit: Payer: Self-pay

## 2019-05-06 ENCOUNTER — Emergency Department (HOSPITAL_COMMUNITY): Payer: Medicare Other

## 2019-05-06 ENCOUNTER — Encounter (HOSPITAL_COMMUNITY): Payer: Self-pay | Admitting: *Deleted

## 2019-05-06 DIAGNOSIS — N3289 Other specified disorders of bladder: Secondary | ICD-10-CM | POA: Diagnosis not present

## 2019-05-06 DIAGNOSIS — Z79899 Other long term (current) drug therapy: Secondary | ICD-10-CM | POA: Insufficient documentation

## 2019-05-06 DIAGNOSIS — I639 Cerebral infarction, unspecified: Secondary | ICD-10-CM | POA: Diagnosis not present

## 2019-05-06 DIAGNOSIS — R9389 Abnormal findings on diagnostic imaging of other specified body structures: Secondary | ICD-10-CM | POA: Insufficient documentation

## 2019-05-06 DIAGNOSIS — I10 Essential (primary) hypertension: Secondary | ICD-10-CM | POA: Insufficient documentation

## 2019-05-06 DIAGNOSIS — R319 Hematuria, unspecified: Secondary | ICD-10-CM | POA: Insufficient documentation

## 2019-05-06 DIAGNOSIS — R310 Gross hematuria: Secondary | ICD-10-CM

## 2019-05-06 LAB — CBC WITH DIFFERENTIAL/PLATELET
Abs Immature Granulocytes: 0.02 10*3/uL (ref 0.00–0.07)
Basophils Absolute: 0 10*3/uL (ref 0.0–0.1)
Basophils Relative: 1 %
Eosinophils Absolute: 0.5 10*3/uL (ref 0.0–0.5)
Eosinophils Relative: 6 %
HCT: 43.4 % (ref 39.0–52.0)
Hemoglobin: 13.9 g/dL (ref 13.0–17.0)
Immature Granulocytes: 0 %
Lymphocytes Relative: 14 %
Lymphs Abs: 1.1 10*3/uL (ref 0.7–4.0)
MCH: 30.5 pg (ref 26.0–34.0)
MCHC: 32 g/dL (ref 30.0–36.0)
MCV: 95.4 fL (ref 80.0–100.0)
Monocytes Absolute: 1.1 10*3/uL — ABNORMAL HIGH (ref 0.1–1.0)
Monocytes Relative: 13 %
Neutro Abs: 5.5 10*3/uL (ref 1.7–7.7)
Neutrophils Relative %: 66 %
Platelets: 253 10*3/uL (ref 150–400)
RBC: 4.55 MIL/uL (ref 4.22–5.81)
RDW: 12.8 % (ref 11.5–15.5)
WBC: 8.3 10*3/uL (ref 4.0–10.5)
nRBC: 0 % (ref 0.0–0.2)

## 2019-05-06 LAB — URINALYSIS, ROUTINE W REFLEX MICROSCOPIC
Bilirubin Urine: NEGATIVE
Glucose, UA: NEGATIVE mg/dL
Ketones, ur: NEGATIVE mg/dL
Nitrite: NEGATIVE
Protein, ur: 100 mg/dL — AB
RBC / HPF: >50 RBC/hpf — ABNORMAL HIGH (ref 0–5)
Specific Gravity, Urine: 1.02 (ref 1.005–1.030)
WBC, UA: >50 WBC/hpf — ABNORMAL HIGH (ref 0–5)
pH: 6 (ref 5.0–8.0)

## 2019-05-06 LAB — COMPREHENSIVE METABOLIC PANEL
ALT: 17 U/L (ref 0–44)
AST: 19 U/L (ref 15–41)
Albumin: 3.3 g/dL — ABNORMAL LOW (ref 3.5–5.0)
Alkaline Phosphatase: 87 U/L (ref 38–126)
Anion gap: 12 (ref 5–15)
BUN: 41 mg/dL — ABNORMAL HIGH (ref 8–23)
CO2: 25 mmol/L (ref 22–32)
Calcium: 8.1 mg/dL — ABNORMAL LOW (ref 8.9–10.3)
Chloride: 98 mmol/L (ref 98–111)
Creatinine, Ser: 1.79 mg/dL — ABNORMAL HIGH (ref 0.61–1.24)
GFR calc Af Amer: 40 mL/min — ABNORMAL LOW (ref >60)
GFR calc non Af Amer: 35 mL/min — ABNORMAL LOW (ref >60)
Glucose, Bld: 120 mg/dL — ABNORMAL HIGH (ref 70–99)
Potassium: 4.1 mmol/L (ref 3.5–5.1)
Sodium: 135 mmol/L (ref 135–145)
Total Bilirubin: 1 mg/dL (ref 0.3–1.2)
Total Protein: 6.7 g/dL (ref 6.5–8.1)

## 2019-05-06 MED ORDER — SODIUM CHLORIDE 0.9 % IV BOLUS
500.0000 mL | Freq: Once | INTRAVENOUS | Status: AC
  Administered 2019-05-06: 500 mL via INTRAVENOUS

## 2019-05-06 NOTE — ED Provider Notes (Signed)
Medical screening examination/treatment/procedure(s) were conducted as a shared visit with non-physician practitioner(s) and myself.  I personally evaluated the patient during the encounter.  Clinical Impression:   Final diagnoses:  Gross hematuria  Abnormal CT scan    This patient is an 84 year old male, prior history of a stroke leaving him with left-sided weakness, cared for by his daughter at home.  Switched from Plavix to Eliquis yesterday after he was found to be in atrial fibrillation as he has an implantable monitor in his left chest.  He has been on Eliquis for 2 doses, has noted blood in his urine overnight as well as some pain on the left flank.  He does not have any increased weakness at this time compared to prior stroke and the daughter corroborates this.  His family doctor wanted him to come to the hospital for further evaluation to make sure he was not having any increasing development of stroke, hemorrhage or intra-abdominal pathology.  On my exam the patient has a soft nontender abdomen, clear heart and lung sounds, slight arrhythmia or ectopy, no significant edema.  He has weak on the left compared to the right.  Mental status is clear, able to speak in full sentences.  CT renal, CT head, anticipate discharge if all is negative, urine culture will be sent, he is on doxycycline  Pt and familiy informed that there will need to be urology and specialty f/u given abnormal CT scan findings - they expressed their understanding   Noemi Chapel, MD 05/07/19 [phone]

## 2019-05-06 NOTE — ED Provider Notes (Signed)
[facility] Provider Note   CSN: [id number] Arrival date & time: 05/06/19  1209     History Chief Complaint  Patient presents with  . Hematuria    LINARD DAFT is a 84 y.o. male with pertinent past medical history of hyperlipidemia, hypertension, arthritis, recent right occipital lobe stroke with left hemiparesis, patient is on Eliquis (was discharged about a month ago from [facility]) that presents to the emergency room today for hematuria.  Patient is present with daughter who is able to provide most of the history.  She states that he was diagnosed with a UTI from home nurse yesterday and put on doxycycline.  He has taken 2 doses of this so far.  She states that this morning he was complaining of some lower back pain and dysuria, she noticed frank blood in the urinal. He denies any abdominal pain, nausea, vomiting, fevers, chills, night sweats.  He is still complaining of dysuria.  He has not had a UTI in several years.  He states that his back pain is apparent only when he moves and bilaterally his lower back.  They called their PCP, Dr. Donnald Garre, who advised him to come here.  In addition, she told her PCP that she has noticed her father more weak on the left side in the past 2 days.  Dr. Donnald Garre recommended coming to the ED for another CT in case he had another stroke.  Daughter states that he did have some left-sided residual hemiparesis due to his stroke 1 month ago, however for the past 2 days she has noticed that he has become more weak and needed more assistance.  Denies any new vision changes, speech difficulties, headache, facial droop, new paresthesias, syncope, lightheadedness, dizziness, falls weight changes.. Patient states that he did have prostate cancer 3 years and got multiple rounds of radiation therapy.  He is following a urologist for this, he states that his last appointment was last September.  HPI     Past Medical History:  Diagnosis Date  . Arthritis    . Hyperlipidemia   . Hypertension     Patient Active Problem List   Diagnosis Date Noted  . Effusion of left elbow 04/12/2019  . Chronic left shoulder pain 04/12/2019  . Rt Occipital Lobe Stroke with Lt hemiparesis 04/09/2019  . Dysphagia following cerebrovascular accident 04/09/2019  . Speech disturbance due to Acute CVA 04/09/2019  . Tobacco abuse-Chews Tobacco 04/09/2019  . Paresthesia   . Hypocalcemia 04/03/2019  . Prolonged QT interval 04/03/2019  . HTN (hypertension) 04/03/2019  . HLD (hyperlipidemia) 04/03/2019  . Weakness 04/03/2019    Past Surgical History:  Procedure Laterality Date  . LOOP RECORDER INSERTION N/A 04/12/2019   Procedure: LOOP RECORDER INSERTION;  Surgeon: Evans Lance, MD;  Location: [facility];  Service: Cardiovascular;  Laterality: N/A;       History reviewed. No pertinent family history.  Social History   Tobacco Use  . Smoking status: Never Smoker  . Smokeless tobacco: Current User    Types: Chew  Substance Use Topics  . Alcohol use: Never  . Drug use: Not on file    Home Medications Prior to Admission medications   Medication Sig Start Date End Date Taking? Authorizing Provider  amLODipine (NORVASC) 5 MG tablet Take 5 mg by mouth daily. 01/11/19  Yes [provider]  apixaban (ELIQUIS) 5 MG TABS tablet Take 1 tablet (5 mg total) by mouth 2 (two) times daily. STOP ASA AND  PLAVIX. Ok to start evening of 4/22. 05/05/19  Yes Tillery, Satira Mccallum, PA-C  atorvastatin (LIPITOR) 80 MG tablet Take 1 tablet (80 mg total) by mouth at bedtime. 04/13/19  Yes Florencia Reasons, MD  calcium-vitamin D (OSCAL WITH D) 500-200 MG-UNIT tablet Take 1 tablet by mouth 2 (two) times daily. 04/04/19  Yes Barton Dubois, MD  clotrimazole-betamethasone (LOTRISONE) cream Apply 1 application topically 2 (two) times daily. 04/29/19  Yes [provider]  doxycycline (VIBRAMYCIN) 100 MG capsule Take 100 mg by mouth 2 (two) times daily. 05/04/19  Yes  [provider]  feeding supplement, ENSURE ENLIVE, (ENSURE ENLIVE) LIQD Take 237 mLs by mouth 3 (three) times daily after meals. 04/13/19  Yes Florencia Reasons, MD  rosuvastatin (CRESTOR) 20 MG tablet Take 20 mg by mouth daily. 05/04/19  Yes [provider]  traMADol (ULTRAM) 50 MG tablet Take 50 mg by mouth 3 (three) times daily as needed. 02/21/19  Yes [provider]  Vitamin D, Ergocalciferol, (DRISDOL) 1.25 MG (50000 UNIT) CAPS capsule Take 1 capsule (50,000 Units total) by mouth every 7 (seven) days. 04/11/19  Yes Barton Dubois, MD  zolpidem (AMBIEN) 10 MG tablet Take 10 mg by mouth at bedtime as needed for sleep. 05/02/19  Yes [provider]    Allergies    Patient has no known allergies.  Review of Systems   Review of Systems  Constitutional: Negative for chills, fatigue and fever.  HENT: Negative for facial swelling, sinus pain and sore throat.   Eyes: Negative for pain.  Respiratory: Negative for chest tightness and shortness of breath.   Cardiovascular: Negative for chest pain.  Gastrointestinal: Negative for abdominal pain, diarrhea, nausea and vomiting.  Genitourinary: Positive for dysuria and hematuria. Negative for flank pain.  Musculoskeletal: Positive for back pain. Negative for joint swelling, neck pain and neck stiffness.  Skin: Negative for pallor.  Neurological: Positive for weakness. Negative for syncope, facial asymmetry, speech difficulty, numbness and headaches.  Psychiatric/Behavioral: Negative for behavioral problems, confusion and decreased concentration.    Physical Exam Updated Vital Signs BP 109/66 (BP Location: Left Arm)   Pulse 60   Temp 98.6 F (37 C) (Oral)   Resp 17   Ht 6' (1.829 m)   Wt 108.9 kg   SpO2 97%   BMI 32.55 kg/m   Physical Exam   .PE: Constitutional: well-developed, well-nourished, no apparent distress HENT: normocephalic, atraumatic Cardiovascular: normal rate and rhythm, distal pulses  intact Pulmonary/Chest: effort normal; breath sounds clear and equal bilaterally; no wheezes or rales Abdominal: soft and nontender Musculoskeletal: no midline tenderness on cervical, thoracic, lumbar spine. No CVA tenderness.  Patient is unable to move left side. Lymphadenopathy: no cervical adenopathy Neuro: Alert. Clear speech. No facial droop. EOMS normal. PERRLA. CNIII-XII grossly intact, except CN XI (unable to shoulder shrug on left side due to previous stroke). Sensation normal bilaterally. 5/5 strength on right side, left side strength 0/5 on left lower, 1/5 on left upper.Negative pronator drift, unable to access on left side.  Skin: warm and dry, no rash, no diaphoresis Psychiatric: normal mood and affect, normal behavior   ED Results / Procedures / Treatments   Labs (all labs ordered are listed, but only abnormal results are displayed) Labs Reviewed  URINALYSIS, ROUTINE W REFLEX MICROSCOPIC - Abnormal; Notable for the following components:      Result Value   Color, Urine AMBER (*)    APPearance CLOUDY (*)    Hgb urine dipstick LARGE (*)  Protein, ur 100 (*)    Leukocytes,Ua MODERATE (*)    RBC / HPF >50 (*)    WBC, UA >50 (*)    Bacteria, UA RARE (*)    Non Squamous Epithelial 0-5 (*)    All other components within normal limits  CBC WITH DIFFERENTIAL/PLATELET - Abnormal; Notable for the following components:   Monocytes Absolute 1.1 (*)    All other components within normal limits  COMPREHENSIVE METABOLIC PANEL - Abnormal; Notable for the following components:   Glucose, Bld 120 (*)    BUN 41 (*)    Creatinine, Ser 1.79 (*)    Calcium 8.1 (*)    Albumin 3.3 (*)    GFR calc non Af Amer 35 (*)    GFR calc Af Amer 40 (*)    All other components within normal limits  URINE CULTURE    EKG None  Radiology CT Head Wo Contrast  Result Date: 05/06/2019 CLINICAL DATA:  Left-sided weakness.  Question acute stroke. EXAM: CT HEAD WITHOUT CONTRAST TECHNIQUE: Contiguous  axial images were obtained from the base of the skull through the vertex without intravenous contrast. COMPARISON:  04/09/2019 FINDINGS: Brain: No abnormality seen affecting the brainstem or cerebellum. Subacute infarction in the right PCA territory affecting the posteromedial temporal lobe and occipital lobe continues to show low-density with mild swelling but there is no sign of hemorrhagic transformation. Mild subacute ischemic changes also evident in the right thalamus. Chronic small-vessel change of the hemispheric white matter elsewhere. No sign of new infarction, mass lesion, hemorrhage, hydrocephalus or extra-axial collection. Vascular: There is atherosclerotic calcification of the major vessels at the base of the brain. Skull: Negative Sinuses/Orbits: Clear/normal Other: None IMPRESSION: No new or acute finding. Subacute infarction in the right PCA territory affecting the posteromedial temporal lobe, occipital lobe and right thalamus as seen acutely in late March. No hemorrhagic transformation. Electronically Signed   By: Nelson Chimes M.D.   On: 05/06/2019 17:17   CT Renal Stone Study  Result Date: 05/06/2019 CLINICAL DATA:  Hematuria. EXAM: CT ABDOMEN AND PELVIS WITHOUT CONTRAST TECHNIQUE: Multidetector CT imaging of the abdomen and pelvis was performed following the standard protocol without IV contrast. COMPARISON:  Remote renal ultrasound 11/15/2009 FINDINGS: Lower chest: Mild elevation of left hemidiaphragm with adjacent atelectasis/scarring. Small calcifications in the posterior right costophrenic sulcus may be calcified granuloma. Hepatobiliary: No evidence of focal hepatic abnormality on noncontrast exam. Layering hyperdensity in the gallbladder. No pericholecystic inflammation. No biliary dilatation. Pancreas: Parenchymal atrophy. No ductal dilatation or inflammation. Spleen: Normal in size without focal abnormality. Adrenals/Urinary Tract: Normal adrenal glands. No renal calculi. There is  bilateral renal parenchymal thinning. Complex cyst versus adjacent clustered cysts in the posterior mid left kidney measure 3.2 x 2.9 cm. No hydronephrosis. No significant perinephric edema. No obvious solid mass on noncontrast exam. With ureters are decompressed without stones along the course. Circumferential bladder wall thickening. There is focal nodular thickening of the posterior right urinary trigone, series 2, image 67. Mass effect on the bladder base from enlarged prostate gland. Generalized perivesicular stranding. Stomach/Bowel: Nondistended stomach. No small bowel obstruction or inflammation. Normal appendix. Moderate stool burden throughout the colon. There is colonic tortuosity particularly involving the sigmoid. Mild sigmoid diverticulosis without diverticulitis. No colonic wall thickening or inflammation. Vascular/Lymphatic: Aorto bi-iliac atherosclerosis. No aortic aneurysm. There are small periportal and retroperitoneal lymph nodes are not enlarged by size criteria, nonspecific. Reproductive: Mildly enlarged prostate gland causes mass effect on the bladder base. Surgical clips within  the prostate posteriorly. Other: Mild presacral edema. No ascites. No free air or focal fluid collection. No significant body wall hernia. Musculoskeletal: Advanced degenerative disc disease and facet hypertrophy in the lumbar spine. 8 mm anterolisthesis of L3 on L4 with chronic L3 bilateral pars interarticularis defects. No evidence of focal bone lesion. IMPRESSION: 1. Circumferential bladder wall thickening with perivesicular stranding, cystitis versus chronic in the setting of enlarged prostate gland. 2. Focal nodular thickening of the posterior right urinary trigone. Recommend further evaluation with cystoscopy. 3. No renal stones or obstructive uropathy. Bilateral renal parenchymal thinning. Complex cyst versus adjacent clustered cysts in the posterior mid left kidney measuring 3.2 x 2.9 cm. A 2.8 cm hypoechoic  lesion with seen on remote renal ultrasound 11/15/2009. Lack of significant change in size over the course of 10 years favors a benign etiology, however this is technically indeterminate on noncontrast CT. 4. Mildly enlarged prostate gland causes mass effect on the bladder base. 5. Advanced degenerative disc disease and facet hypertrophy in the lumbar spine. 8 mm anterolisthesis of L3 on L4 with chronic L3 bilateral pars interarticularis defects. 6. Layering hyperdensity in the gallbladder may represent sludge or stones. No pericholecystic inflammation. Aortic Atherosclerosis (ICD10-I70.0). Electronically Signed   By: Keith Rake M.D.   On: 05/06/2019 17:21   CUP PACEART INCLINIC DEVICE CHECK  Result Date: 05/05/2019 Virtual ILR wound check. Steri strips removed. Wound well healed. Home monitor with orange blinking light. Unplugged for 30 seconds and green light. Transmission received. 10 additional AF episodes. No other episode types. EGMs appear NSR with frequent ectopy. One episode 1 hr 38 minutes with rates 120-160s, highly suspicious of true AF. All others 2-4 minutes and appear NSR with ectopy. Will review further with MD for plan.   Procedures Procedures (including critical care time)  Medications Ordered in ED Medications  sodium chloride 0.9 % bolus 500 mL (has no administration in time range)    ED Course  I have reviewed the triage vital signs and the nursing notes.  Pertinent labs & imaging results that were available during my care of the patient were reviewed by me and considered in my medical decision making (see chart for details).    MDM Rules/Calculators/A&P                      MALACAI GRANTZ is a 84 y.o. male with pertinent past medical history of hyperlipidemia, hypertension, arthritis, recent right occipital lobe stroke with left hemiparesis, patient is on Eliquis (was discharged about a month ago from [facility]) that presents to the emergency room today for hematuria.   Patient was recently diagnosed with UTI yesterday and has taken 2 doses of doxycycline.  Patient does not have any CVA tenderness, nausea, vomiting, fevers.  Work-up to include CT renal, CBC, CMP, urinalysis, urine culture.  Patient's daughter also states that he is having some new left-sided weakness.  He did have his previous stroke 1 month ago.  PCP wanted another CT to rule out another stroke.  CT head ordered, MRI not indicated at this time due to patient's implantable monitor in chest. Patient's creatinine 1.79, increase from 1.353 weeks ago.  Patient most likely dehydrated, gave IV fluid in the emergency room today. CBC and CMP stable.  Patient's hematuria could be due to UTI since it has only been for 1 day, however prostate cancer is on the differential.  CT renal study showed no stones, however did show bladder wall thickening, radiologist recommended a  cystoscopy.  CT head with no evidence of acute stroke without any evidence of hemorrhagic transformation..  Patient's vitals are stable and patient is ready for discharge.  Follow-up recommended with Dr. Lovena Neighbours, urology for abnormal renal CT scan.  Strong ER return precautions given -nausea, vomiting, fevers, tachycardia, continuous hematuria, dizziness, headache, vision changes, syncope. CT findings were explained in depth to daughter who will follow up with urologist. Seems reliable.   I discussed this case with my attending physician, Dr. Sabra Heck, who cosigned this note including patient's presenting symptoms, physical exam, and planned diagnostics and interventions. Attending physician stated agreement with plan or made changes to plan which were implemented.   Attending physician assessed patient at bedside.  Final Clinical Impression(s) / ED Diagnoses Final diagnoses:  None    Rx / DC Orders ED Discharge Orders    None       Alfredia Client, PA-C 05/06/19 Raynald Blend, MD 05/07/19 [phone]

## 2019-05-06 NOTE — ED Triage Notes (Signed)
Patient comes to the ED accompanied by daughter.  Per home health nurse patient has blood in urine for one day.  Daughter reports frank blood in urine today.

## 2019-05-06 NOTE — Discharge Instructions (Addendum)
You were seen today for blood in your urine. Your CT of your kidneys did not show a kidney stone, but it did show some thickening of your bladder.  I want you to follow-up with Dr. Lovena Neighbours about this to evaluate if this is cancer.  Your scan of your head did not show any new strokes. Follow up PCP. Continue taking her doxycycline.  Come back to the emergency room if you start developing any vomiting, fevers, chest pain, dizziness, headache, continuous blood in your urine, vision changes, passing out, worsening weakness, facial droop.  A urine culture was ordered today and the results will be shared in a couple of days.   CT Renal Stone Study  Result Date: 05/06/2019 CLINICAL DATA:  Hematuria. EXAM: CT ABDOMEN AND PELVIS WITHOUT CONTRAST TECHNIQUE: Multidetector CT imaging of the abdomen and pelvis was performed following the standard protocol without IV contrast. COMPARISON:  Remote renal ultrasound 11/15/2009 FINDINGS: Lower chest: Mild elevation of left hemidiaphragm with adjacent atelectasis/scarring. Small calcifications in the posterior right costophrenic sulcus may be calcified granuloma. Hepatobiliary: No evidence of focal hepatic abnormality on noncontrast exam. Layering hyperdensity in the gallbladder. No pericholecystic inflammation. No biliary dilatation. Pancreas: Parenchymal atrophy. No ductal dilatation or inflammation. Spleen: Normal in size without focal abnormality. Adrenals/Urinary Tract: Normal adrenal glands. No renal calculi. There is bilateral renal parenchymal thinning. Complex cyst versus adjacent clustered cysts in the posterior mid left kidney measure 3.2 x 2.9 cm. No hydronephrosis. No significant perinephric edema. No obvious solid mass on noncontrast exam. With ureters are decompressed without stones along the course. Circumferential bladder wall thickening. There is focal nodular thickening of the posterior right urinary trigone, series 2, image 67. Mass effect on the bladder base  from enlarged prostate gland. Generalized perivesicular stranding. Stomach/Bowel: Nondistended stomach. No small bowel obstruction or inflammation. Normal appendix. Moderate stool burden throughout the colon. There is colonic tortuosity particularly involving the sigmoid. Mild sigmoid diverticulosis without diverticulitis. No colonic wall thickening or inflammation. Vascular/Lymphatic: Aorto bi-iliac atherosclerosis. No aortic aneurysm. There are small periportal and retroperitoneal lymph nodes are not enlarged by size criteria, nonspecific. Reproductive: Mildly enlarged prostate gland causes mass effect on the bladder base. Surgical clips within the prostate posteriorly. Other: Mild presacral edema. No ascites. No free air or focal fluid collection. No significant body wall hernia. Musculoskeletal: Advanced degenerative disc disease and facet hypertrophy in the lumbar spine. 8 mm anterolisthesis of L3 on L4 with chronic L3 bilateral pars interarticularis defects. No evidence of focal bone lesion. IMPRESSION: 1. Circumferential bladder wall thickening with perivesicular stranding, cystitis versus chronic in the setting of enlarged prostate gland. 2. Focal nodular thickening of the posterior right urinary trigone. Recommend further evaluation with cystoscopy. 3. No renal stones or obstructive uropathy. Bilateral renal parenchymal thinning. Complex cyst versus adjacent clustered cysts in the posterior mid left kidney measuring 3.2 x 2.9 cm. A 2.8 cm hypoechoic lesion with seen on remote renal ultrasound 11/15/2009. Lack of significant change in size over the course of 10 years favors a benign etiology, however this is technically indeterminate on noncontrast CT. 4. Mildly enlarged prostate gland causes mass effect on the bladder base. 5. Advanced degenerative disc disease and facet hypertrophy in the lumbar spine. 8 mm anterolisthesis of L3 on L4 with chronic L3 bilateral pars interarticularis defects. 6. Layering  hyperdensity in the gallbladder may represent sludge or stones. No pericholecystic inflammation. Aortic Atherosclerosis (ICD10-I70.0). Electronically Signed   By: Keith Rake M.D.   On: 05/06/2019 17:21

## 2019-05-08 LAB — URINE CULTURE: Culture: NO GROWTH

## 2019-05-09 ENCOUNTER — Telehealth: Payer: Self-pay | Admitting: Internal Medicine

## 2019-05-09 DIAGNOSIS — L89321 Pressure ulcer of left buttock, stage 1: Secondary | ICD-10-CM | POA: Diagnosis not present

## 2019-05-09 DIAGNOSIS — I69391 Dysphagia following cerebral infarction: Secondary | ICD-10-CM | POA: Diagnosis not present

## 2019-05-09 DIAGNOSIS — I69954 Hemiplegia and hemiparesis following unspecified cerebrovascular disease affecting left non-dominant side: Secondary | ICD-10-CM | POA: Diagnosis not present

## 2019-05-09 DIAGNOSIS — N182 Chronic kidney disease, stage 2 (mild): Secondary | ICD-10-CM | POA: Diagnosis not present

## 2019-05-09 DIAGNOSIS — L89620 Pressure ulcer of left heel, unstageable: Secondary | ICD-10-CM | POA: Diagnosis not present

## 2019-05-09 DIAGNOSIS — R262 Difficulty in walking, not elsewhere classified: Secondary | ICD-10-CM | POA: Diagnosis not present

## 2019-05-09 DIAGNOSIS — M159 Polyosteoarthritis, unspecified: Secondary | ICD-10-CM | POA: Diagnosis not present

## 2019-05-09 DIAGNOSIS — M25422 Effusion, left elbow: Secondary | ICD-10-CM | POA: Diagnosis not present

## 2019-05-09 DIAGNOSIS — I63531 Cerebral infarction due to unspecified occlusion or stenosis of right posterior cerebral artery: Secondary | ICD-10-CM | POA: Diagnosis not present

## 2019-05-09 DIAGNOSIS — I10 Essential (primary) hypertension: Secondary | ICD-10-CM | POA: Diagnosis not present

## 2019-05-09 DIAGNOSIS — M6281 Muscle weakness (generalized): Secondary | ICD-10-CM | POA: Diagnosis not present

## 2019-05-09 DIAGNOSIS — E785 Hyperlipidemia, unspecified: Secondary | ICD-10-CM | POA: Diagnosis not present

## 2019-05-09 NOTE — Telephone Encounter (Signed)
Pt c/o medication issue:  1. Name of Medication: apixaban (ELIQUIS) 5 MG TABS tablet  2. How are you currently taking this medication (dosage and times per day)? As directed  3. Are you having a reaction (difficulty breathing--STAT)? yes  4. What is your medication issue? Per [facility]N , when patient urinates it is bright red, almost as if it is pure blood. Please advise the daughter as to what to do next.  The patient is scheduled to se the Urologist 05-20-19. The [facility] made the appointment and when she spoke with the Urologist she was advised to call Dr. Lovena Le to let him know what is going on

## 2019-05-09 NOTE — Telephone Encounter (Signed)
Left message for daughter advising would speak with GT in the morning and call her back.  Dr. Lovena Le is in surgery all day today.

## 2019-05-10 DIAGNOSIS — R1310 Dysphagia, unspecified: Secondary | ICD-10-CM | POA: Diagnosis not present

## 2019-05-10 DIAGNOSIS — I639 Cerebral infarction, unspecified: Secondary | ICD-10-CM | POA: Diagnosis not present

## 2019-05-10 NOTE — Telephone Encounter (Signed)
Call placed to Pt's daughter.  Advised per Dr. Chuck Hint until Pt sees urologist.   Notified daughter.  Daughter indicates understanding.  Advised to keep [place] posted of Pt's urology appt.

## 2019-05-11 DIAGNOSIS — R262 Difficulty in walking, not elsewhere classified: Secondary | ICD-10-CM | POA: Diagnosis not present

## 2019-05-11 DIAGNOSIS — I639 Cerebral infarction, unspecified: Secondary | ICD-10-CM | POA: Diagnosis not present

## 2019-05-11 DIAGNOSIS — I69954 Hemiplegia and hemiparesis following unspecified cerebrovascular disease affecting left non-dominant side: Secondary | ICD-10-CM | POA: Diagnosis not present

## 2019-05-11 DIAGNOSIS — I69391 Dysphagia following cerebral infarction: Secondary | ICD-10-CM | POA: Diagnosis not present

## 2019-05-11 DIAGNOSIS — M6281 Muscle weakness (generalized): Secondary | ICD-10-CM | POA: Diagnosis not present

## 2019-05-11 DIAGNOSIS — I63531 Cerebral infarction due to unspecified occlusion or stenosis of right posterior cerebral artery: Secondary | ICD-10-CM | POA: Diagnosis not present

## 2019-05-11 DIAGNOSIS — M159 Polyosteoarthritis, unspecified: Secondary | ICD-10-CM | POA: Diagnosis not present

## 2019-05-11 DIAGNOSIS — N182 Chronic kidney disease, stage 2 (mild): Secondary | ICD-10-CM | POA: Diagnosis not present

## 2019-05-11 DIAGNOSIS — E785 Hyperlipidemia, unspecified: Secondary | ICD-10-CM | POA: Diagnosis not present

## 2019-05-11 DIAGNOSIS — R1310 Dysphagia, unspecified: Secondary | ICD-10-CM | POA: Diagnosis not present

## 2019-05-11 DIAGNOSIS — L8990 Pressure ulcer of unspecified site, unspecified stage: Secondary | ICD-10-CM | POA: Diagnosis not present

## 2019-05-11 DIAGNOSIS — I10 Essential (primary) hypertension: Secondary | ICD-10-CM | POA: Diagnosis not present

## 2019-05-11 DIAGNOSIS — M25422 Effusion, left elbow: Secondary | ICD-10-CM | POA: Diagnosis not present

## 2019-05-13 DIAGNOSIS — M25422 Effusion, left elbow: Secondary | ICD-10-CM | POA: Diagnosis not present

## 2019-05-13 DIAGNOSIS — N184 Chronic kidney disease, stage 4 (severe): Secondary | ICD-10-CM | POA: Diagnosis not present

## 2019-05-13 DIAGNOSIS — M159 Polyosteoarthritis, unspecified: Secondary | ICD-10-CM | POA: Diagnosis not present

## 2019-05-13 DIAGNOSIS — I63531 Cerebral infarction due to unspecified occlusion or stenosis of right posterior cerebral artery: Secondary | ICD-10-CM | POA: Diagnosis not present

## 2019-05-13 DIAGNOSIS — I69391 Dysphagia following cerebral infarction: Secondary | ICD-10-CM | POA: Diagnosis not present

## 2019-05-13 DIAGNOSIS — M6281 Muscle weakness (generalized): Secondary | ICD-10-CM | POA: Diagnosis not present

## 2019-05-13 DIAGNOSIS — I129 Hypertensive chronic kidney disease with stage 1 through stage 4 chronic kidney disease, or unspecified chronic kidney disease: Secondary | ICD-10-CM | POA: Diagnosis not present

## 2019-05-13 DIAGNOSIS — R262 Difficulty in walking, not elsewhere classified: Secondary | ICD-10-CM | POA: Diagnosis not present

## 2019-05-13 DIAGNOSIS — R1310 Dysphagia, unspecified: Secondary | ICD-10-CM | POA: Diagnosis not present

## 2019-05-13 DIAGNOSIS — E1122 Type 2 diabetes mellitus with diabetic chronic kidney disease: Secondary | ICD-10-CM | POA: Diagnosis not present

## 2019-05-13 DIAGNOSIS — E785 Hyperlipidemia, unspecified: Secondary | ICD-10-CM | POA: Diagnosis not present

## 2019-05-13 DIAGNOSIS — I10 Essential (primary) hypertension: Secondary | ICD-10-CM | POA: Diagnosis not present

## 2019-05-13 DIAGNOSIS — I69954 Hemiplegia and hemiparesis following unspecified cerebrovascular disease affecting left non-dominant side: Secondary | ICD-10-CM | POA: Diagnosis not present

## 2019-05-13 DIAGNOSIS — L8990 Pressure ulcer of unspecified site, unspecified stage: Secondary | ICD-10-CM | POA: Diagnosis not present

## 2019-05-13 DIAGNOSIS — N182 Chronic kidney disease, stage 2 (mild): Secondary | ICD-10-CM | POA: Diagnosis not present

## 2019-05-13 DIAGNOSIS — I639 Cerebral infarction, unspecified: Secondary | ICD-10-CM | POA: Diagnosis not present

## 2019-05-16 ENCOUNTER — Ambulatory Visit (INDEPENDENT_AMBULATORY_CARE_PROVIDER_SITE_OTHER): Payer: Medicare Other | Admitting: *Deleted

## 2019-05-16 DIAGNOSIS — I63531 Cerebral infarction due to unspecified occlusion or stenosis of right posterior cerebral artery: Secondary | ICD-10-CM | POA: Diagnosis not present

## 2019-05-16 DIAGNOSIS — M159 Polyosteoarthritis, unspecified: Secondary | ICD-10-CM | POA: Diagnosis not present

## 2019-05-16 DIAGNOSIS — N182 Chronic kidney disease, stage 2 (mild): Secondary | ICD-10-CM | POA: Diagnosis not present

## 2019-05-16 DIAGNOSIS — I69391 Dysphagia following cerebral infarction: Secondary | ICD-10-CM | POA: Diagnosis not present

## 2019-05-16 DIAGNOSIS — M25422 Effusion, left elbow: Secondary | ICD-10-CM | POA: Diagnosis not present

## 2019-05-16 DIAGNOSIS — I10 Essential (primary) hypertension: Secondary | ICD-10-CM | POA: Diagnosis not present

## 2019-05-16 DIAGNOSIS — M6281 Muscle weakness (generalized): Secondary | ICD-10-CM | POA: Diagnosis not present

## 2019-05-16 DIAGNOSIS — E785 Hyperlipidemia, unspecified: Secondary | ICD-10-CM | POA: Diagnosis not present

## 2019-05-16 DIAGNOSIS — I69954 Hemiplegia and hemiparesis following unspecified cerebrovascular disease affecting left non-dominant side: Secondary | ICD-10-CM | POA: Diagnosis not present

## 2019-05-16 DIAGNOSIS — R262 Difficulty in walking, not elsewhere classified: Secondary | ICD-10-CM | POA: Diagnosis not present

## 2019-05-16 LAB — CUP PACEART REMOTE DEVICE CHECK
Date Time Interrogation Session: 20210501221831
Implantable Pulse Generator Implant Date: 20210330

## 2019-05-17 ENCOUNTER — Other Ambulatory Visit: Payer: Self-pay

## 2019-05-17 ENCOUNTER — Ambulatory Visit: Payer: Medicare Other | Admitting: Neurology

## 2019-05-17 ENCOUNTER — Encounter: Payer: Self-pay | Admitting: Neurology

## 2019-05-17 VITALS — BP 121/72 | HR 74 | Temp 97.4°F | Ht 72.0 in

## 2019-05-17 DIAGNOSIS — E785 Hyperlipidemia, unspecified: Secondary | ICD-10-CM | POA: Diagnosis not present

## 2019-05-17 DIAGNOSIS — M6281 Muscle weakness (generalized): Secondary | ICD-10-CM | POA: Diagnosis not present

## 2019-05-17 DIAGNOSIS — I69954 Hemiplegia and hemiparesis following unspecified cerebrovascular disease affecting left non-dominant side: Secondary | ICD-10-CM | POA: Diagnosis not present

## 2019-05-17 DIAGNOSIS — N182 Chronic kidney disease, stage 2 (mild): Secondary | ICD-10-CM | POA: Diagnosis not present

## 2019-05-17 DIAGNOSIS — I63531 Cerebral infarction due to unspecified occlusion or stenosis of right posterior cerebral artery: Secondary | ICD-10-CM | POA: Diagnosis not present

## 2019-05-17 DIAGNOSIS — I6621 Occlusion and stenosis of right posterior cerebral artery: Secondary | ICD-10-CM | POA: Diagnosis not present

## 2019-05-17 DIAGNOSIS — I480 Paroxysmal atrial fibrillation: Secondary | ICD-10-CM

## 2019-05-17 DIAGNOSIS — I10 Essential (primary) hypertension: Secondary | ICD-10-CM | POA: Diagnosis not present

## 2019-05-17 DIAGNOSIS — H53461 Homonymous bilateral field defects, right side: Secondary | ICD-10-CM

## 2019-05-17 DIAGNOSIS — M25422 Effusion, left elbow: Secondary | ICD-10-CM | POA: Diagnosis not present

## 2019-05-17 DIAGNOSIS — M159 Polyosteoarthritis, unspecified: Secondary | ICD-10-CM | POA: Diagnosis not present

## 2019-05-17 DIAGNOSIS — R262 Difficulty in walking, not elsewhere classified: Secondary | ICD-10-CM | POA: Diagnosis not present

## 2019-05-17 DIAGNOSIS — I69391 Dysphagia following cerebral infarction: Secondary | ICD-10-CM | POA: Diagnosis not present

## 2019-05-17 NOTE — Progress Notes (Signed)
[facility] Summary Report / Loop Recorder 

## 2019-05-17 NOTE — Progress Notes (Signed)
[facility] [address] [place]. [place] [address] [phone]       OFFICE FOLLOW-UP NOTE  [name] Date of Birth:  [birth year] Medical Record Number:  [id number]   HPI: Curtis Jenkins is a 84 year old Caucasian male seen today for initial office follow-up visit following hospital admission for stroke in March 2021.  History is obtained from the patient as well as his daughter is accompanying her and review of electronic medical records and have personally reviewed imaging films in PACS.  He has past medical history of hypertension, hyperlipidemia who presented to [facility] sudden onset left-sided weakness, speech and swallowing difficulties and was transferred to [facility] on 04/09/2019.  He stated he had problems going on for about a week and at baseline uses a walker to ambulate due to arthritis.  He had previously had a CT scan done a week prior for unsteady gait and weakness but that was unremarkable.  He continues to have difficulties on his left side and hence went back to the hospital and repeat CT scan showed a large right PCA infarct.  His NIH HSS was 10 and had dense left, MS hemianopsia and mild left hemiparesis with subjective diminished left hemibody sensation.  MRI scan confirmed a large subacute right PCA infarct with moderate chronic small vessel disease changes.  CT angiogram of the brain showed chronic right PCA occlusion with widely patent carotid and vertebral arteries.  2D echo showed slightly diminished ejection fraction of 40 to 45% with global hypokinesis.  No definite clot was noted.  LDL cholesterol 77 mg percent.  Hemoglobin A1c was 6.3.  Urine drug screen was negative.  He was started on aspirin and Plavix and had a loop recorder placed.  Patient had paroxysmal A. fib diagnosed on 05/05/2019.  Patient was started on Eliquis after discussion with Dr. Crissie Sickles.  Patient took it only for few days and developed hematuria and was  asked to stop it.  He does have history of chronic prostate problems and sees Dr. Eulas Post from [facility] urology and has an upcoming appointment to see him on Friday.  He is also since developed left heel pressure ulcer and not been walking a lot because of that.  He has been getting home physical and occupational therapy and has been working with them with transfers and standing only.  His left-sided peripheral vision loss persists but he feels it may be improving.  Left-sided numbness is a lot better.  Patient on inquiry also admits of mild short-term memory difficulties there is noticed and the daughter state that she has to repeat herself and give him the same information more than once. ROS:   14 system review of systems is positive for vision loss, memory loss, weakness, gait difficulty, heel ulcer, pain and all other systems negative  PMH:  Past Medical History:  Diagnosis Date  . Arthritis   . Hyperlipidemia   . Hypertension   . Stroke [facility])     Social History:  Social History   Socioeconomic History  . Marital status: Married    Spouse name: Not on file  . Number of children: Not on file  . Years of education: Not on file  . Highest education level: Not on file  Occupational History  . Not on file  Tobacco Use  . Smoking status: Never Smoker  . Smokeless tobacco: Current User    Types: Chew  Substance and Sexual Activity  . Alcohol use: Never  .  Drug use: Not on file  . Sexual activity: Not on file  Other Topics Concern  . Not on file  Social History Narrative  . Not on file   Social Determinants of Health   Financial Resource Strain:   . Difficulty of Paying Living Expenses:   Food Insecurity:   . Worried About Charity fundraiser in the Last Year:   . Arboriculturist in the Last Year:   Transportation Needs:   . Film/video editor (Medical):   Marland Kitchen Lack of Transportation (Non-Medical):   Physical Activity:   . Days of Exercise per Week:   . Minutes of  Exercise per Session:   Stress:   . Feeling of Stress :   Social Connections:   . Frequency of Communication with Friends and Family:   . Frequency of Social Gatherings with Friends and Family:   . Attends Religious Services:   . Active Member of Clubs or Organizations:   . Attends Archivist Meetings:   Marland Kitchen Marital Status:   Intimate Partner Violence:   . Fear of Current or Ex-Partner:   . Emotionally Abused:   Marland Kitchen Physically Abused:   . Sexually Abused:     Medications:   Current Outpatient Medications on File Prior to Visit  Medication Sig Dispense Refill  . amLODipine (NORVASC) 5 MG tablet Take 5 mg by mouth daily.    Marland Kitchen atorvastatin (LIPITOR) 80 MG tablet Take 1 tablet (80 mg total) by mouth at bedtime. 30 tablet 0  . calcium-vitamin D (OSCAL WITH D) 500-200 MG-UNIT tablet Take 1 tablet by mouth 2 (two) times daily. 60 tablet 2  . clotrimazole-betamethasone (LOTRISONE) cream Apply 1 application topically 2 (two) times daily.    . feeding supplement, ENSURE ENLIVE, (ENSURE ENLIVE) LIQD Take 237 mLs by mouth 3 (three) times daily after meals. 237 mL 12  . FIBER PO Take by mouth in the morning and at bedtime.    . Magnesium 200 MG TABS Take by mouth in the morning and at bedtime.    . rosuvastatin (CRESTOR) 20 MG tablet Take 20 mg by mouth daily.    . traMADol (ULTRAM) 50 MG tablet Take 50 mg by mouth 3 (three) times daily as needed.    . Vitamin D, Ergocalciferol, (DRISDOL) 1.25 MG (50000 UNIT) CAPS capsule Take 1 capsule (50,000 Units total) by mouth every 7 (seven) days. 12 capsule 0  . zolpidem (AMBIEN) 10 MG tablet Take 10 mg by mouth at bedtime as needed for sleep.     No current facility-administered medications on file prior to visit.    Allergies:  No Known Allergies  Physical Exam General: well developed, well nourished, seated, in no evident distress Head: head normocephalic and atraumatic.  Neck: supple with no carotid or supraclavicular  bruits Cardiovascular: regular rate and rhythm, no murmurs Musculoskeletal: no deformity.  Left shoulder elevation limited due to pain. Skin:  no rash/petichiae.  Left heel pressure ulcer wearing pressure bandage. Vascular:  Normal pulses all extremities Vitals:   05/17/19 1417  BP: 121/72  Pulse: 74  Temp: (!) 97.4 F (36.3 C)   Neurologic Exam Mental Status: Awake and fully alert. Oriented to place and time. Recent and remote memory intact. Attention span, concentration and fund of knowledge appropriate. Mood and affect appropriate.  Cranial Nerves: Fundoscopic exam reveals sharp disc margins. Pupils equal, briskly reactive to light. Extraocular movements full without nystagmus. Visual fields show dense left homonymous hemianopsia to confrontation. Hearing intact.  Facial sensation intact.  Mild left lower facial asymmetry., tongue, palate moves normally and symmetrically.  Motor: Normal bulk and tone,l strength in right extremity muscles.  Left hemiparesis with 3/5 left upper extremity and 4/5 left lower extremity strength.  Weakness of left grip and intrinsic hand muscles.  Left shoulder elevation limited due to pain Sensory.: intact to touch ,pinprick .position and vibratory sensation.  Coordination: Rapid alternating movements normal in all extremities. Finger-to-nose and heel-to-shin performed accurately bilaterally. Gait and Station: Arises from chair without difficulty. Stance is normal. Gait demonstrates normal stride length and balance . Able to heel, toe and tandem walk without difficulty.  Reflexes: 1+ and symmetric. Toes downgoing.   NIHSS  5 Modified Rankin  4   ASSESSMENT: 84 year old Caucasian male with embolic right posterior cerebral artery infarct and March 2021 secondary to paroxysmal atrial fibrillation which was diagnosed later on loop recorder.  He has significant residual left homonymous hemianopsia and left hemiparesis and mild cognitive impairment.  Vascular risk  factors of hyperlipidemia, hypertension, paroxysmal A. fib and age     PLAN: I had a long d/w patient and her daughter about his recent embolic stroke, new diagnosis of paroxysmal atrial fibrillation, risk for recurrent stroke/TIAs, personally independently reviewed imaging studies and stroke evaluation results and answered questions.Continue to hold Eliquis for now due to hematuria and when it is safe to resume it  for secondary stroke prevention and maintain strict control of hypertension with blood pressure goal below 130/90, diabetes with hemoglobin A1c goal below 6.5% and lipids with LDL cholesterol goal below 70 mg/dL. I also advised the patient to eat a healthy diet with plenty of whole grains, cereals, fruits and vegetables, exercise regularly and maintain ideal body weight .continue ongoing home physical occupational therapy.  Resume Eliquis once cleared by urology.  Greater than 50% time during the 30-minute follow-up visit was spent in counseling and coordination of care about his embolic stroke and atrial fibrillation and discussion of risk benefit of anticoagulation with Eliquis and answering questions followup in the future with my nurse practitioner Janett Billow in 3 months or call earlier if necessary. Greater than 50% of time during this 25 minute visit was spent on counseling,explanation of diagnosis, planning of further management, discussion with patient and family and coordination of care Antony Contras, MD  [facility] [address] [place], [place] [address]  Phone [phone] Fax [phone] Note: This document was prepared with digital dictation and possible smart phrase technology. Any transcriptional errors that result from this process are unintentional

## 2019-05-17 NOTE — Patient Instructions (Signed)
I had a long d/w patient and her daughter about his recent embolic stroke, new diagnosis of paroxysmal atrial fibrillation, risk for recurrent stroke/TIAs, personally independently reviewed imaging studies and stroke evaluation results and answered questions.Continue to hold Eliquis for now due to hematuria and when it is safe to resume it  for secondary stroke prevention and maintain strict control of hypertension with blood pressure goal below 130/90, diabetes with hemoglobin A1c goal below 6.5% and lipids with LDL cholesterol goal below 70 mg/dL. I also advised the patient to eat a healthy diet with plenty of whole grains, cereals, fruits and vegetables, exercise regularly and maintain ideal body weight .continue ongoing home physical occupational therapy.  Resume Eliquis once cleared by urology.  Followup in the future with my nurse practitioner Janett Billow in 3 months or call earlier if necessary.  Stroke Prevention Some medical conditions and behaviors are associated with a higher chance of having a stroke. You can help prevent a stroke by making nutrition, lifestyle, and other changes, including managing any medical conditions you may have. What nutrition changes can be made?   Eat healthy foods. You can do this by: ? Choosing foods high in fiber, such as fresh fruits and vegetables and whole grains. ? Eating at least 5 or more servings of fruits and vegetables a day. Try to fill half of your plate at each meal with fruits and vegetables. ? Choosing lean protein foods, such as lean cuts of meat, poultry without skin, fish, tofu, beans, and nuts. ? Eating low-fat dairy products. ? Avoiding foods that are high in salt (sodium). This can help lower blood pressure. ? Avoiding foods that have saturated fat, trans fat, and cholesterol. This can help prevent high cholesterol. ? Avoiding processed and premade foods.  Follow your health care provider's specific guidelines for losing weight, controlling  high blood pressure (hypertension), lowering high cholesterol, and managing diabetes. These may include: ? Reducing your daily calorie intake. ? Limiting your daily sodium intake to 1,500 milligrams (mg). ? Using only healthy fats for cooking, such as olive oil, canola oil, or sunflower oil. ? Counting your daily carbohydrate intake. What lifestyle changes can be made?  Maintain a healthy weight. Talk to your health care provider about your ideal weight.  Get at least 30 minutes of moderate physical activity at least 5 days a week. Moderate activity includes brisk walking, biking, and swimming.  Do not use any products that contain nicotine or tobacco, such as cigarettes and e-cigarettes. If you need help quitting, ask your health care provider. It may also be helpful to avoid exposure to secondhand smoke.  Limit alcohol intake to no more than 1 drink a day for nonpregnant women and 2 drinks a day for men. One drink equals 12 oz of beer, 5 oz of wine, or 1 oz of hard liquor.  Stop any illegal drug use.  Avoid taking birth control pills. Talk to your health care provider about the risks of taking birth control pills if: ? You are over 66 years old. ? You smoke. ? You get migraines. ? You have ever had a blood clot. What other changes can be made?  Manage your cholesterol levels. ? Eating a healthy diet is important for preventing high cholesterol. If cholesterol cannot be managed through diet alone, you may also need to take medicines. ? Take any prescribed medicines to control your cholesterol as told by your health care provider.  Manage your diabetes. ? Eating a healthy diet and exercising  regularly are important parts of managing your blood sugar. If your blood sugar cannot be managed through diet and exercise, you may need to take medicines. ? Take any prescribed medicines to control your diabetes as told by your health care provider.  Control your hypertension. ? To reduce your  risk of stroke, try to keep your blood pressure below 130/80. ? Eating a healthy diet and exercising regularly are an important part of controlling your blood pressure. If your blood pressure cannot be managed through diet and exercise, you may need to take medicines. ? Take any prescribed medicines to control hypertension as told by your health care provider. ? Ask your health care provider if you should monitor your blood pressure at home. ? Have your blood pressure checked every year, even if your blood pressure is normal. Blood pressure increases with age and some medical conditions.  Get evaluated for sleep disorders (sleep apnea). Talk to your health care provider about getting a sleep evaluation if you snore a lot or have excessive sleepiness.  Take over-the-counter and prescription medicines only as told by your health care provider. Aspirin or blood thinners (antiplatelets or anticoagulants) may be recommended to reduce your risk of forming blood clots that can lead to stroke.  Make sure that any other medical conditions you have, such as atrial fibrillation or atherosclerosis, are managed. What are the warning signs of a stroke? The warning signs of a stroke can be easily remembered as BEFAST.  B is for balance. Signs include: ? Dizziness. ? Loss of balance or coordination. ? Sudden trouble walking.  E is for eyes. Signs include: ? A sudden change in vision. ? Trouble seeing.  F is for face. Signs include: ? Sudden weakness or numbness of the face. ? The face or eyelid drooping to one side.  A is for arms. Signs include: ? Sudden weakness or numbness of the arm, usually on one side of the body.  S is for speech. Signs include: ? Trouble speaking (aphasia). ? Trouble understanding.  T is for time. ? These symptoms may represent a serious problem that is an emergency. Do not wait to see if the symptoms will go away. Get medical help right away. Call your local emergency  services (911 in the [place] Do not drive yourself to the hospital.  Other signs of stroke may include: ? A sudden, severe headache with no known cause. ? Nausea or vomiting. ? Seizure. Where to find more information For more information, visit:  American Stroke Association: www.strokeassociation.org  National Stroke Association: www.stroke.org Summary  You can prevent a stroke by eating healthy, exercising, not smoking, limiting alcohol intake, and managing any medical conditions you may have.  Do not use any products that contain nicotine or tobacco, such as cigarettes and e-cigarettes. If you need help quitting, ask your health care provider. It may also be helpful to avoid exposure to secondhand smoke.  Remember BEFAST for warning signs of stroke. Get help right away if you or a loved one has any of these signs. This information is not intended to replace advice given to you by your health care provider. Make sure you discuss any questions you have with your health care provider. Document Revised: 12/12/2016 Document Reviewed: 02/05/2016 Elsevier Patient Education  2020 [employer].

## 2019-05-19 ENCOUNTER — Ambulatory Visit: Payer: Self-pay | Admitting: Neurology

## 2019-05-19 ENCOUNTER — Ambulatory Visit (INDEPENDENT_AMBULATORY_CARE_PROVIDER_SITE_OTHER): Payer: Medicare Other | Admitting: Internal Medicine

## 2019-05-19 ENCOUNTER — Encounter: Payer: Self-pay | Admitting: Internal Medicine

## 2019-05-19 ENCOUNTER — Other Ambulatory Visit: Payer: Self-pay

## 2019-05-19 VITALS — BP 106/58 | HR 66 | Temp 98.0°F | Ht 71.0 in | Wt 240.0 lb

## 2019-05-19 DIAGNOSIS — I63531 Cerebral infarction due to unspecified occlusion or stenosis of right posterior cerebral artery: Secondary | ICD-10-CM | POA: Diagnosis not present

## 2019-05-19 DIAGNOSIS — N182 Chronic kidney disease, stage 2 (mild): Secondary | ICD-10-CM | POA: Diagnosis not present

## 2019-05-19 DIAGNOSIS — M25422 Effusion, left elbow: Secondary | ICD-10-CM | POA: Diagnosis not present

## 2019-05-19 DIAGNOSIS — I480 Paroxysmal atrial fibrillation: Secondary | ICD-10-CM

## 2019-05-19 DIAGNOSIS — I69391 Dysphagia following cerebral infarction: Secondary | ICD-10-CM | POA: Diagnosis not present

## 2019-05-19 DIAGNOSIS — M159 Polyosteoarthritis, unspecified: Secondary | ICD-10-CM | POA: Diagnosis not present

## 2019-05-19 DIAGNOSIS — M6281 Muscle weakness (generalized): Secondary | ICD-10-CM | POA: Diagnosis not present

## 2019-05-19 DIAGNOSIS — R262 Difficulty in walking, not elsewhere classified: Secondary | ICD-10-CM | POA: Diagnosis not present

## 2019-05-19 DIAGNOSIS — E785 Hyperlipidemia, unspecified: Secondary | ICD-10-CM | POA: Diagnosis not present

## 2019-05-19 DIAGNOSIS — I69954 Hemiplegia and hemiparesis following unspecified cerebrovascular disease affecting left non-dominant side: Secondary | ICD-10-CM | POA: Diagnosis not present

## 2019-05-19 DIAGNOSIS — I10 Essential (primary) hypertension: Secondary | ICD-10-CM | POA: Diagnosis not present

## 2019-05-19 NOTE — Progress Notes (Signed)
Subjective:  1. History of prostate cancer   2. Hemorrhagic cystitis   3. Complex renal cyst      I have prostate cancer. HPI: Curtis Jenkins is a 84 year-old male established patient who is here evaluation for treatment of prostate cancer.  His prostate cancer was diagnosed 11/08/2013. His most recent PSA is 0.7.   He has undergone External Beam Radiation Therapy for treatment in 2017.   History of GS 7(4+3) prostate cancer. He completed radiation in early June 2017.  He had his initial biopsy in 2015 and had 4 cores in the right prostate that were positive. 3 had GS 3+3 and one had GS 7(3+4). His PSA has remained stable over the past year at about 6 and his repeat biopsy in 12/16 showed 3 cores positive on the right with 1 core Gleason 6, 1 core GS 7(3+4) and 1 core GS 7(4+3).   9.18.2020: Most recent PSA 0.7 on 9.16.2020. He denies any blood per urine or stool. He has had no new aches or bony pain aside from arthritis. His appetite remains strong. He denies any significant changes or complaints.   05/20/19: Mr. Haen had a CVA in 3/21.  He was started on Eliquis.  He was found to have a UTI in mid-April and a culture was positive and he was given doxycycline but had progressive hematuria and was seen in the ER with the hematuria and some left flank pain.  A culture in the ER was negative but he had >50 WBC and RBC's.  The blood and pain have resolved but he had CT in ER that showed no stones but there was a possible mass at the base of the bladder.   He has not had a PSA for f/u of his prostate cancer since 9/20.    He is off of the Eliquis temporarily.        ROS:  ROS:  A complete review of systems was performed.  All systems are negative except for pertinent findings as noted.   Review of Systems  Neurological: Positive for weakness (left hemiparesis and difficulty walking. ).    No Known Allergies  Outpatient Encounter Medications as of 05/20/2019  Medication Sig  .  amLODipine (NORVASC) 5 MG tablet Take 5 mg by mouth daily.  Marland Kitchen atorvastatin (LIPITOR) 80 MG tablet Take 1 tablet (80 mg total) by mouth at bedtime.  . calcium-vitamin D (OSCAL WITH D) 500-200 MG-UNIT tablet Take 1 tablet by mouth 2 (two) times daily.  . clotrimazole-betamethasone (LOTRISONE) cream Apply 1 application topically 2 (two) times daily.  . feeding supplement, ENSURE ENLIVE, (ENSURE ENLIVE) LIQD Take 237 mLs by mouth 3 (three) times daily after meals.  Marland Kitchen FIBER PO Take by mouth in the morning and at bedtime.  . Magnesium 200 MG TABS Take by mouth in the morning and at bedtime.  . rosuvastatin (CRESTOR) 20 MG tablet Take 20 mg by mouth daily.  . traMADol (ULTRAM) 50 MG tablet Take 50 mg by mouth 3 (three) times daily as needed.  . Vitamin D, Ergocalciferol, (DRISDOL) 1.25 MG (50000 UNIT) CAPS capsule Take 1 capsule (50,000 Units total) by mouth every 7 (seven) days.  Marland Kitchen zolpidem (AMBIEN) 10 MG tablet Take 10 mg by mouth at bedtime as needed for sleep.   No facility-administered encounter medications on file as of 05/20/2019.    Past Medical History:  Diagnosis Date  . Arthritis   . Hyperlipidemia   . Hypertension   . Stroke [facility])  Past Surgical History:  Procedure Laterality Date  . LOOP RECORDER INSERTION N/A 04/12/2019   Procedure: LOOP RECORDER INSERTION;  Surgeon: Evans Lance, MD;  Location: [place] [facility];  Service: Cardiovascular;  Laterality: N/A;    Social History   Socioeconomic History  . Marital status: Married    Spouse name: Not on file  . Number of children: Not on file  . Years of education: Not on file  . Highest education level: Not on file  Occupational History  . Not on file  Tobacco Use  . Smoking status: Never Smoker  . Smokeless tobacco: Current User    Types: Chew  Substance and Sexual Activity  . Alcohol use: Never  . Drug use: Not on file  . Sexual activity: Not on file  Other Topics Concern  . Not on file  Social History  Narrative  . Not on file   Social Determinants of Health   Financial Resource Strain:   . Difficulty of Paying Living Expenses:   Food Insecurity:   . Worried About Charity fundraiser in the Last Year:   . Arboriculturist in the Last Year:   Transportation Needs:   . Film/video editor (Medical):   Marland Kitchen Lack of Transportation (Non-Medical):   Physical Activity:   . Days of Exercise per Week:   . Minutes of Exercise per Session:   Stress:   . Feeling of Stress :   Social Connections:   . Frequency of Communication with Friends and Family:   . Frequency of Social Gatherings with Friends and Family:   . Attends Religious Services:   . Active Member of Clubs or Organizations:   . Attends Archivist Meetings:   Marland Kitchen Marital Status:   Intimate Partner Violence:   . Fear of Current or Ex-Partner:   . Emotionally Abused:   Marland Kitchen Physically Abused:   . Sexually Abused:     History reviewed. No pertinent family history.     Objective: Vitals:   05/20/19 1104  BP: 107/68  Pulse: 65  Temp: (!) 97.3 F (36.3 C)     Physical Exam Vitals reviewed.  Constitutional:      Appearance: Normal appearance.  Neurological:     Mental Status: He is alert.     Comments: Left hemiparesis.      Lab Results:  Results for orders placed or performed in visit on 05/20/19 (from the past 24 hour(s))  POCT urinalysis dipstick     Status: Abnormal   Collection Time: 05/20/19 11:37 AM  Result Value Ref Range   Color, UA yellow    Clarity, UA clear    Glucose, UA Negative Negative   Bilirubin, UA negative    Ketones, UA negative    Spec Grav, UA     Blood, UA positive    pH, UA     Protein, UA Positive (A) Negative   Urobilinogen, UA 0.2 0.2 or 1.0 E.U./dL   Nitrite, UA negative    Leukocytes, UA Trace (A) Negative   Appearance     Odor      BMET No results for input(s): NA, K, CL, CO2, GLUCOSE, BUN, CREATININE, CALCIUM in the last 72 hours. PSA No results found for:  PSA No results found for: TESTOSTERONE    Studies/Results: I have reviewed the CT films and his hospital notes and labs.   His Cr on 4/23 was 1.79 which is up from 1.35 previously.   CLINICAL DATA:  Hematuria.  EXAM: CT ABDOMEN AND PELVIS WITHOUT CONTRAST  TECHNIQUE: Multidetector CT imaging of the abdomen and pelvis was performed following the standard protocol without IV contrast.  COMPARISON:  Remote renal ultrasound 11/15/2009  FINDINGS: Lower chest: Mild elevation of left hemidiaphragm with adjacent atelectasis/scarring. Small calcifications in the posterior right costophrenic sulcus may be calcified granuloma.  Hepatobiliary: No evidence of focal hepatic abnormality on noncontrast exam. Layering hyperdensity in the gallbladder. No pericholecystic inflammation. No biliary dilatation.  Pancreas: Parenchymal atrophy. No ductal dilatation or inflammation.  Spleen: Normal in size without focal abnormality.  Adrenals/Urinary Tract: Normal adrenal glands. No renal calculi. There is bilateral renal parenchymal thinning. Complex cyst versus adjacent clustered cysts in the posterior mid left kidney measure 3.2 x 2.9 cm. No hydronephrosis. No significant perinephric edema. No obvious solid mass on noncontrast exam. With ureters are decompressed without stones along the course.  Circumferential bladder wall thickening. There is focal nodular thickening of the posterior right urinary trigone, series 2, image 67. Mass effect on the bladder base from enlarged prostate gland. Generalized perivesicular stranding.  Stomach/Bowel: Nondistended stomach. No small bowel obstruction or inflammation. Normal appendix. Moderate stool burden throughout the colon. There is colonic tortuosity particularly involving the sigmoid. Mild sigmoid diverticulosis without diverticulitis. No colonic wall thickening or inflammation.  Vascular/Lymphatic: Aorto bi-iliac atherosclerosis. No  aortic aneurysm. There are small periportal and retroperitoneal lymph nodes are not enlarged by size criteria, nonspecific.  Reproductive: Mildly enlarged prostate gland causes mass effect on the bladder base. Surgical clips within the prostate posteriorly.  Other: Mild presacral edema. No ascites. No free air or focal fluid collection. No significant body wall hernia.  Musculoskeletal: Advanced degenerative disc disease and facet hypertrophy in the lumbar spine. 8 mm anterolisthesis of L3 on L4 with chronic L3 bilateral pars interarticularis defects. No evidence of focal bone lesion.  IMPRESSION: 1. Circumferential bladder wall thickening with perivesicular stranding, cystitis versus chronic in the setting of enlarged prostate gland. 2. Focal nodular thickening of the posterior right urinary trigone. Recommend further evaluation with cystoscopy. 3. No renal stones or obstructive uropathy. Bilateral renal parenchymal thinning. Complex cyst versus adjacent clustered cysts in the posterior mid left kidney measuring 3.2 x 2.9 cm. A 2.8 cm hypoechoic lesion with seen on remote renal ultrasound 11/15/2009. Lack of significant change in size over the course of 10 years favors a benign etiology, however this is technically indeterminate on noncontrast CT. 4. Mildly enlarged prostate gland causes mass effect on the bladder base. 5. Advanced degenerative disc disease and facet hypertrophy in the lumbar spine. 8 mm anterolisthesis of L3 on L4 with chronic L3 bilateral pars interarticularis defects. 6. Layering hyperdensity in the gallbladder may represent sludge or stones. No pericholecystic inflammation.  Aortic Atherosclerosis (ICD10-I70.0).   Electronically Signed   By: Keith Rake M.D.   On: 05/06/2019 17:21    Assessment & Plan: Hemorrhagic cystitis with possible bladder mass.  I will have him return for cystoscopy.  History of prostate cancer.   PSA will be  obtained today.  Left renal cyst.  This is likely benign and I don't think further imaging is needed at this time.    No orders of the defined types were placed in this encounter.    Orders Placed This Encounter  Procedures  . PSA    Standing Status:   Future    Standing Expiration Date:   05/19/2020  . Urinalysis, Routine w reflex microscopic    Standing Status:   Future  Standing Expiration Date:   05/19/2020  . POCT urinalysis dipstick      Return in about 2 weeks (around 06/03/2019) for For cystoscopy..   CC: Redmond School, MD      Irine Seal 05/20/2019

## 2019-05-19 NOTE — Progress Notes (Signed)
HPI Curtis Jenkins returns today for followup of a cryptogenic stroke. He is a pleasant 84 yo man with HTN who sustained a stroke and had an ILR placed. He was found to have PAF. He was started on eliquis and presented with gross hematuria. He was diagnosed with a UTI/Prostatitis and placed on anti-biotics. His eliquis was stopped and he has had resolution of his hematuria. He is pending a visit with Dr. Jeffie Pollock. He denies palpitations.  No Known Allergies   Current Outpatient Medications  Medication Sig Dispense Refill  . amLODipine (NORVASC) 5 MG tablet Take 5 mg by mouth daily.    Marland Kitchen atorvastatin (LIPITOR) 80 MG tablet Take 1 tablet (80 mg total) by mouth at bedtime. 30 tablet 0  . calcium-vitamin D (OSCAL WITH D) 500-200 MG-UNIT tablet Take 1 tablet by mouth 2 (two) times daily. 60 tablet 2  . clotrimazole-betamethasone (LOTRISONE) cream Apply 1 application topically 2 (two) times daily.    . feeding supplement, ENSURE ENLIVE, (ENSURE ENLIVE) LIQD Take 237 mLs by mouth 3 (three) times daily after meals. 237 mL 12  . FIBER PO Take by mouth in the morning and at bedtime.    . Magnesium 200 MG TABS Take by mouth in the morning and at bedtime.    . rosuvastatin (CRESTOR) 20 MG tablet Take 20 mg by mouth daily.    . traMADol (ULTRAM) 50 MG tablet Take 50 mg by mouth 3 (three) times daily as needed.    . Vitamin D, Ergocalciferol, (DRISDOL) 1.25 MG (50000 UNIT) CAPS capsule Take 1 capsule (50,000 Units total) by mouth every 7 (seven) days. 12 capsule 0  . zolpidem (AMBIEN) 10 MG tablet Take 10 mg by mouth at bedtime as needed for sleep.     No current facility-administered medications for this visit.     Past Medical History:  Diagnosis Date  . Arthritis   . Hyperlipidemia   . Hypertension   . Stroke ([place])     ROS:   All systems reviewed and negative except as noted in the HPI.   Past Surgical History:  Procedure Laterality Date  . LOOP RECORDER INSERTION N/A 04/12/2019   Procedure: LOOP RECORDER INSERTION;  Surgeon: Evans Lance, MD;  Location: [place] [facility];  Service: Cardiovascular;  Laterality: N/A;     No family history on file.   Social History   Socioeconomic History  . Marital status: Married    Spouse name: Not on file  . Number of children: Not on file  . Years of education: Not on file  . Highest education level: Not on file  Occupational History  . Not on file  Tobacco Use  . Smoking status: Never Smoker  . Smokeless tobacco: Current User    Types: Chew  Substance and Sexual Activity  . Alcohol use: Never  . Drug use: Not on file  . Sexual activity: Not on file  Other Topics Concern  . Not on file  Social History Narrative  . Not on file   Social Determinants of Health   Financial Resource Strain:   . Difficulty of Paying Living Expenses:   Food Insecurity:   . Worried About Charity fundraiser in the Last Year:   . Arboriculturist in the Last Year:   Transportation Needs:   . Film/video editor (Medical):   Marland Kitchen Lack of Transportation (Non-Medical):   Physical Activity:   . Days of Exercise per Week:   .  Minutes of Exercise per Session:   Stress:   . Feeling of Stress :   Social Connections:   . Frequency of Communication with Friends and Family:   . Frequency of Social Gatherings with Friends and Family:   . Attends Religious Services:   . Active Member of Clubs or Organizations:   . Attends Archivist Meetings:   Marland Kitchen Marital Status:   Intimate Partner Violence:   . Fear of Current or Ex-Partner:   . Emotionally Abused:   Marland Kitchen Physically Abused:   . Sexually Abused:      BP (!) 106/58   Pulse 66   Temp 98 F (36.7 C)   Ht 5\' 11"  (1.803 m)   Wt 240 lb (108.9 kg)   SpO2 93%   BMI 33.47 kg/m   Physical Exam:  Well appearing NAD HEENT: Unremarkable Neck:  No JVD, no thyromegally Lymphatics:  No adenopathy Back:  No CVA tenderness Lungs:  Clear with no wheezes HEART:  Regular rate  rhythm, no murmurs, no rubs, no clicks Abd:  soft, positive bowel sounds, no organomegally, no rebound, no guarding Ext:  2 plus pulses, no edema, no cyanosis, no clubbing Skin:  No rashes no nodules Neuro:  CN II through XII intact, motor grossly intact   DEVICE  Normal device function.  See PaceArt for details. ILR demonstrates 99.5% NSR and 0.5 % atrial fib.   Assess/Plan: 1. Cryptogenic stroke - he has been found to have atrial fib. I recommend restarting the eliquis 5 mg bid if ok with Dr. Jeffie Pollock. If he has more hematuria, we would consider either decreasing the dose of trying coumadin. 2. PAF - he is asymptomatic. He is maintaining NSR 99.5%.  3. HTN - his bp is controlled. We will follow. 4. ILR - his device is interrogated and working normally.   His implantable loop recorder is MRI compatible. He may undergo MRI scanning as needed.  Curtis Jenkins.D.

## 2019-05-19 NOTE — Patient Instructions (Addendum)
Medication Instructions:  Your physician recommends that you continue on your current medications as directed. Please refer to the Current Medication list given to you today. May start Eliquis after seeing Dr. Jeffie Pollock   Your device is MRI compatible   *If you need a refill on your cardiac medications before your next appointment, please call your pharmacy*   Lab Work:  NONE   If you have labs (blood work) drawn today and your tests are completely normal, you will receive your results only by: Marland Kitchen MyChart Message (if you have MyChart) OR . A paper copy in the mail If you have any lab test that is abnormal or we need to change your treatment, we will call you to review the results.   Testing/Procedures: NONE    Follow-Up: At [facility], you and your health needs are our priority.  As part of our continuing mission to provide you with exceptional heart care, we have created designated Provider Care Teams.  These Care Teams include your primary Cardiologist (physician) and Advanced Practice Providers (APPs -  Physician Assistants and Nurse Practitioners) who all work together to provide you with the care you need, when you need it.  We recommend signing up for the patient portal called "MyChart".  Sign up information is provided on this After Visit Summary.  MyChart is used to connect with patients for Virtual Visits (Telemedicine).  Patients are able to view lab/test results, encounter notes, upcoming appointments, etc.  Non-urgent messages can be sent to your provider as well.   To learn more about what you can do with MyChart, go to [email or web address].    Your next appointment:   6 month(s)  The format for your next appointment:   In Person  Provider:   Cristopher Peru, MD   Other Instructions Thank you for choosing [facility]!

## 2019-05-20 ENCOUNTER — Other Ambulatory Visit (HOSPITAL_COMMUNITY)
Admission: RE | Admit: 2019-05-20 | Discharge: 2019-05-20 | Disposition: A | Discharge status: Home / Self Care | Payer: Medicare Other | Source: Other Acute Inpatient Hospital | Attending: Urology | Admitting: Urology

## 2019-05-20 ENCOUNTER — Other Ambulatory Visit: Payer: Self-pay | Admitting: Urology

## 2019-05-20 ENCOUNTER — Encounter: Payer: Self-pay | Admitting: Urology

## 2019-05-20 ENCOUNTER — Ambulatory Visit: Payer: Medicare Other | Admitting: Urology

## 2019-05-20 VITALS — BP 107/68 | HR 65 | Temp 97.3°F

## 2019-05-20 DIAGNOSIS — Z8546 Personal history of malignant neoplasm of prostate: Secondary | ICD-10-CM | POA: Diagnosis not present

## 2019-05-20 DIAGNOSIS — N281 Cyst of kidney, acquired: Secondary | ICD-10-CM

## 2019-05-20 DIAGNOSIS — N3091 Cystitis, unspecified with hematuria: Secondary | ICD-10-CM | POA: Diagnosis not present

## 2019-05-20 LAB — POCT URINALYSIS DIPSTICK
Bilirubin, UA: NEGATIVE
Blood, UA: POSITIVE
Glucose, UA: NEGATIVE
Ketones, UA: NEGATIVE
Nitrite, UA: NEGATIVE
Protein, UA: POSITIVE — AB
Urobilinogen, UA: 0.2 E.U./dL

## 2019-05-20 LAB — URINALYSIS, ROUTINE W REFLEX MICROSCOPIC
Bacteria, UA: NONE SEEN
Bilirubin Urine: NEGATIVE
Glucose, UA: NEGATIVE mg/dL
Ketones, ur: NEGATIVE mg/dL
Nitrite: NEGATIVE
Protein, ur: NEGATIVE mg/dL
RBC / HPF: >50 RBC/hpf — ABNORMAL HIGH (ref 0–5)
Specific Gravity, Urine: 1.014 (ref 1.005–1.030)
pH: 7 (ref 5.0–8.0)

## 2019-05-21 LAB — PSA: PSA: 0.5 ng/mL (ref <=4.0)

## 2019-05-23 DIAGNOSIS — E785 Hyperlipidemia, unspecified: Secondary | ICD-10-CM | POA: Diagnosis not present

## 2019-05-23 DIAGNOSIS — M6281 Muscle weakness (generalized): Secondary | ICD-10-CM | POA: Diagnosis not present

## 2019-05-23 DIAGNOSIS — I69954 Hemiplegia and hemiparesis following unspecified cerebrovascular disease affecting left non-dominant side: Secondary | ICD-10-CM | POA: Diagnosis not present

## 2019-05-23 DIAGNOSIS — R262 Difficulty in walking, not elsewhere classified: Secondary | ICD-10-CM | POA: Diagnosis not present

## 2019-05-23 DIAGNOSIS — M25422 Effusion, left elbow: Secondary | ICD-10-CM | POA: Diagnosis not present

## 2019-05-23 DIAGNOSIS — I69391 Dysphagia following cerebral infarction: Secondary | ICD-10-CM | POA: Diagnosis not present

## 2019-05-23 DIAGNOSIS — N182 Chronic kidney disease, stage 2 (mild): Secondary | ICD-10-CM | POA: Diagnosis not present

## 2019-05-23 DIAGNOSIS — M159 Polyosteoarthritis, unspecified: Secondary | ICD-10-CM | POA: Diagnosis not present

## 2019-05-23 DIAGNOSIS — I63531 Cerebral infarction due to unspecified occlusion or stenosis of right posterior cerebral artery: Secondary | ICD-10-CM | POA: Diagnosis not present

## 2019-05-23 DIAGNOSIS — I10 Essential (primary) hypertension: Secondary | ICD-10-CM | POA: Diagnosis not present

## 2019-05-24 DIAGNOSIS — N182 Chronic kidney disease, stage 2 (mild): Secondary | ICD-10-CM | POA: Diagnosis not present

## 2019-05-24 DIAGNOSIS — I69954 Hemiplegia and hemiparesis following unspecified cerebrovascular disease affecting left non-dominant side: Secondary | ICD-10-CM | POA: Diagnosis not present

## 2019-05-24 DIAGNOSIS — M6281 Muscle weakness (generalized): Secondary | ICD-10-CM | POA: Diagnosis not present

## 2019-05-24 DIAGNOSIS — M25422 Effusion, left elbow: Secondary | ICD-10-CM | POA: Diagnosis not present

## 2019-05-24 DIAGNOSIS — M159 Polyosteoarthritis, unspecified: Secondary | ICD-10-CM | POA: Diagnosis not present

## 2019-05-24 DIAGNOSIS — I69391 Dysphagia following cerebral infarction: Secondary | ICD-10-CM | POA: Diagnosis not present

## 2019-05-24 DIAGNOSIS — R262 Difficulty in walking, not elsewhere classified: Secondary | ICD-10-CM | POA: Diagnosis not present

## 2019-05-24 DIAGNOSIS — E785 Hyperlipidemia, unspecified: Secondary | ICD-10-CM | POA: Diagnosis not present

## 2019-05-24 DIAGNOSIS — I10 Essential (primary) hypertension: Secondary | ICD-10-CM | POA: Diagnosis not present

## 2019-05-24 DIAGNOSIS — I63531 Cerebral infarction due to unspecified occlusion or stenosis of right posterior cerebral artery: Secondary | ICD-10-CM | POA: Diagnosis not present

## 2019-05-25 DIAGNOSIS — E785 Hyperlipidemia, unspecified: Secondary | ICD-10-CM | POA: Diagnosis not present

## 2019-05-25 DIAGNOSIS — N182 Chronic kidney disease, stage 2 (mild): Secondary | ICD-10-CM | POA: Diagnosis not present

## 2019-05-25 DIAGNOSIS — I10 Essential (primary) hypertension: Secondary | ICD-10-CM | POA: Diagnosis not present

## 2019-05-25 DIAGNOSIS — M159 Polyosteoarthritis, unspecified: Secondary | ICD-10-CM | POA: Diagnosis not present

## 2019-05-25 DIAGNOSIS — I63531 Cerebral infarction due to unspecified occlusion or stenosis of right posterior cerebral artery: Secondary | ICD-10-CM | POA: Diagnosis not present

## 2019-05-25 DIAGNOSIS — M25422 Effusion, left elbow: Secondary | ICD-10-CM | POA: Diagnosis not present

## 2019-05-25 DIAGNOSIS — R262 Difficulty in walking, not elsewhere classified: Secondary | ICD-10-CM | POA: Diagnosis not present

## 2019-05-25 DIAGNOSIS — M6281 Muscle weakness (generalized): Secondary | ICD-10-CM | POA: Diagnosis not present

## 2019-05-25 DIAGNOSIS — I69954 Hemiplegia and hemiparesis following unspecified cerebrovascular disease affecting left non-dominant side: Secondary | ICD-10-CM | POA: Diagnosis not present

## 2019-05-25 DIAGNOSIS — I69391 Dysphagia following cerebral infarction: Secondary | ICD-10-CM | POA: Diagnosis not present

## 2019-05-26 ENCOUNTER — Telehealth: Payer: Self-pay

## 2019-05-26 DIAGNOSIS — N182 Chronic kidney disease, stage 2 (mild): Secondary | ICD-10-CM | POA: Diagnosis not present

## 2019-05-26 DIAGNOSIS — M159 Polyosteoarthritis, unspecified: Secondary | ICD-10-CM | POA: Diagnosis not present

## 2019-05-26 DIAGNOSIS — I10 Essential (primary) hypertension: Secondary | ICD-10-CM | POA: Diagnosis not present

## 2019-05-26 DIAGNOSIS — E785 Hyperlipidemia, unspecified: Secondary | ICD-10-CM | POA: Diagnosis not present

## 2019-05-26 DIAGNOSIS — I63531 Cerebral infarction due to unspecified occlusion or stenosis of right posterior cerebral artery: Secondary | ICD-10-CM | POA: Diagnosis not present

## 2019-05-26 DIAGNOSIS — I69954 Hemiplegia and hemiparesis following unspecified cerebrovascular disease affecting left non-dominant side: Secondary | ICD-10-CM | POA: Diagnosis not present

## 2019-05-26 DIAGNOSIS — M6281 Muscle weakness (generalized): Secondary | ICD-10-CM | POA: Diagnosis not present

## 2019-05-26 DIAGNOSIS — I69391 Dysphagia following cerebral infarction: Secondary | ICD-10-CM | POA: Diagnosis not present

## 2019-05-26 DIAGNOSIS — M25422 Effusion, left elbow: Secondary | ICD-10-CM | POA: Diagnosis not present

## 2019-05-26 DIAGNOSIS — R262 Difficulty in walking, not elsewhere classified: Secondary | ICD-10-CM | POA: Diagnosis not present

## 2019-05-26 NOTE — Telephone Encounter (Signed)
Carelink alert report received. Battery status OK. Normal device function. No new symptom episodes, tachy episodes, brady, or pause episodes. Two new AF episodes, both were two minutes.    Pt has implant due to Cryptogenic stroke, PAF was found on monitor and pt started on Eliquis.  Subsequently pt experienced Hematuria and Eliquis has been on hold since 4/26.  At last office visit 5/6 Dr. Lovena Le indicated that pt should restart Eliquis once cleared to do so by urologist.    Curtis Jenkins with pt, he has not been symptomatic lately.  States he has been working with PT and denies any SOB, CP or dizziness.  He could not confirm medications, states his daughter Curtis Jenkins manages those.   Spoke with Curtis Jenkins, she indicates that Dr. Jeffie Pollock gave the ok to restart Eliquis with instructions to hold if hematuria occurred  Pt restarted Eliquis on 5/7, per Curtis Jenkins pt had episode of Hematuria on 5/12 so she had him hold his AM dose of Eliquis and resume taking it that evening.  As of right now pt is taking Eliquis and all other meds as ordered.  Pt does have an upcoming appt with Dr. Jeffie Pollock on 5/21.

## 2019-05-27 NOTE — Telephone Encounter (Signed)
If he continues to have hematuria, ask him to reduce his dose of eliquis to 2.5 mg bid  GT

## 2019-05-27 NOTE — Progress Notes (Signed)
His PSA is down further at 0.5.

## 2019-05-30 DIAGNOSIS — I10 Essential (primary) hypertension: Secondary | ICD-10-CM | POA: Diagnosis not present

## 2019-05-30 DIAGNOSIS — E785 Hyperlipidemia, unspecified: Secondary | ICD-10-CM | POA: Diagnosis not present

## 2019-05-30 DIAGNOSIS — N182 Chronic kidney disease, stage 2 (mild): Secondary | ICD-10-CM | POA: Diagnosis not present

## 2019-05-30 DIAGNOSIS — I69954 Hemiplegia and hemiparesis following unspecified cerebrovascular disease affecting left non-dominant side: Secondary | ICD-10-CM | POA: Diagnosis not present

## 2019-05-30 DIAGNOSIS — M6281 Muscle weakness (generalized): Secondary | ICD-10-CM | POA: Diagnosis not present

## 2019-05-30 DIAGNOSIS — I63531 Cerebral infarction due to unspecified occlusion or stenosis of right posterior cerebral artery: Secondary | ICD-10-CM | POA: Diagnosis not present

## 2019-05-30 DIAGNOSIS — M25422 Effusion, left elbow: Secondary | ICD-10-CM | POA: Diagnosis not present

## 2019-05-30 DIAGNOSIS — M159 Polyosteoarthritis, unspecified: Secondary | ICD-10-CM | POA: Diagnosis not present

## 2019-05-30 DIAGNOSIS — R262 Difficulty in walking, not elsewhere classified: Secondary | ICD-10-CM | POA: Diagnosis not present

## 2019-05-30 DIAGNOSIS — I69391 Dysphagia following cerebral infarction: Secondary | ICD-10-CM | POA: Diagnosis not present

## 2019-05-30 NOTE — Telephone Encounter (Signed)
Spoke with pt, he denies any more episodes of hematuria.

## 2019-06-02 DIAGNOSIS — M6281 Muscle weakness (generalized): Secondary | ICD-10-CM | POA: Diagnosis not present

## 2019-06-02 DIAGNOSIS — I10 Essential (primary) hypertension: Secondary | ICD-10-CM | POA: Diagnosis not present

## 2019-06-02 DIAGNOSIS — I69391 Dysphagia following cerebral infarction: Secondary | ICD-10-CM | POA: Diagnosis not present

## 2019-06-02 DIAGNOSIS — M159 Polyosteoarthritis, unspecified: Secondary | ICD-10-CM | POA: Diagnosis not present

## 2019-06-02 DIAGNOSIS — N182 Chronic kidney disease, stage 2 (mild): Secondary | ICD-10-CM | POA: Diagnosis not present

## 2019-06-02 DIAGNOSIS — I69954 Hemiplegia and hemiparesis following unspecified cerebrovascular disease affecting left non-dominant side: Secondary | ICD-10-CM | POA: Diagnosis not present

## 2019-06-02 DIAGNOSIS — R262 Difficulty in walking, not elsewhere classified: Secondary | ICD-10-CM | POA: Diagnosis not present

## 2019-06-02 DIAGNOSIS — E785 Hyperlipidemia, unspecified: Secondary | ICD-10-CM | POA: Diagnosis not present

## 2019-06-02 DIAGNOSIS — I63531 Cerebral infarction due to unspecified occlusion or stenosis of right posterior cerebral artery: Secondary | ICD-10-CM | POA: Diagnosis not present

## 2019-06-02 DIAGNOSIS — M25422 Effusion, left elbow: Secondary | ICD-10-CM | POA: Diagnosis not present

## 2019-06-03 ENCOUNTER — Other Ambulatory Visit: Payer: Self-pay

## 2019-06-03 ENCOUNTER — Encounter: Payer: Self-pay | Admitting: Urology

## 2019-06-03 ENCOUNTER — Ambulatory Visit: Payer: Medicare Other | Admitting: Urology

## 2019-06-03 VITALS — BP 116/67 | HR 66 | Temp 97.3°F | Ht 71.0 in | Wt 235.0 lb

## 2019-06-03 DIAGNOSIS — N3091 Cystitis, unspecified with hematuria: Secondary | ICD-10-CM

## 2019-06-03 MED ORDER — CIPROFLOXACIN HCL 500 MG PO TABS
500.0000 mg | ORAL_TABLET | Freq: Once | ORAL | Status: AC
  Administered 2019-06-03: 500 mg via ORAL

## 2019-06-03 MED ORDER — CIPROFLOXACIN HCL 500 MG PO TABS: 500.0000 mg | ORAL_TABLET | Freq: Once | ORAL | Status: DC

## 2019-06-03 NOTE — Patient Instructions (Signed)
Transurethral Resection of Bladder Tumor  Transurethral resection of a bladder tumor is the removal (resection) of a cancerous growth (tumor) on the inside wall of the bladder. The bladder is the organ that holds urine. The tumor is removed through the tube that carries urine out of the body (urethra). In a transurethral resection, a thin telescope with a light, a tiny camera, and an electric cutting edge (resectoscope) is passed through the urethra. In men, the opening of the urethra is at the end of the penis. In women, it is just above the opening of the vagina. Tell a health care provider about:  Any allergies you have.  All medicines you are taking, including vitamins, herbs, eye drops, creams, and over-the-counter medicines.  Any problems you or family members have had with anesthetic medicines.  Any blood disorders you have.  Any surgeries you have had.  Any medical conditions you have.  Any recent urinary tract infections you have had.  Whether you are pregnant or may be pregnant. What are the risks? Generally, this is a safe procedure. However, problems may occur, including:  Infection.  Bleeding.  Allergic reactions to medicines.  Damage to nearby structures or organs, such as: ? The urethra. ? The tubes that drain urine from the kidneys into the bladder (ureters).  Pain and burning during urination.  Difficulty urinating due to partial blockage of the urethra.  Inability to urinate (urinary retention). What happens before the procedure? Staying hydrated Follow instructions from your health care provider about hydration, which may include:  Up to 2 hours before the procedure - you may continue to drink clear liquids, such as water, clear fruit juice, black coffee, and plain tea.  Eating and drinking restrictions Follow instructions from your health care provider about eating and drinking, which may include:  8 hours before the procedure - stop eating heavy  meals or foods, such as meat, fried foods, or fatty foods.  6 hours before the procedure - stop eating light meals or foods, such as toast or cereal.  6 hours before the procedure - stop drinking milk or drinks that contain milk.  2 hours before the procedure - stop drinking clear liquids. Medicines Ask your health care provider about:  Changing or stopping your regular medicines. This is especially important if you are taking diabetes medicines or blood thinners.  Taking medicines such as aspirin and ibuprofen. These medicines can thin your blood. Do not take these medicines unless your health care provider tells you to take them.  Taking over-the-counter medicines, vitamins, herbs, and supplements. Tests You may have exams or tests, including:  Physical exam.  Blood tests.  Urine tests.  Electrocardiogram (ECG). This test measures the electrical activity of the heart. General instructions  Plan to have someone take you home from the hospital or clinic.  Ask your health care provider how your surgical site will be marked or identified.  Ask your health care provider what steps will be taken to help prevent infection. These may include: ? Washing skin with a germ-killing soap. ? Taking antibiotic medicine. What happens during the procedure?  An IV will be inserted into one of your veins.  You will be given one or more of the following: ? A medicine to help you relax (sedative). ? A medicine to make you fall asleep (general anesthetic). ? A medicine that is injected into your spine to numb the area below and slightly above the injection site (spinal anesthetic).  Your legs will be   placed in foot rests (stirrups) so that your legs are apart and your knees are bent.  The resectoscope will be passed through your urethra and into your bladder.  The part of your bladder that is affected by the tumor will be resected using the cutting edge of the resectoscope.  The  resectoscope will be removed.  A thin, flexible tube (catheter) will be passed through your urethra and into your bladder. The catheter will drain urine into a bag outside of your body. ? Fluid may be passed through the catheter to keep the catheter open. The procedure may vary among health care providers and hospitals. What happens after the procedure?  Your blood pressure, heart rate, breathing rate, and blood oxygen level will be monitored until you leave the hospital or clinic.  You may continue to receive fluids and medicines through an IV.  You will have some pain. You will be given pain medicine to relieve pain.  You will have a catheter to drain your urine. ? You will have blood in your urine. Your catheter may be kept in until your urine is clear. ? The amount of urine will be monitored. If necessary, your bladder may be rinsed out (irrigated) by passing fluid through your catheter.  You will be encouraged to walk around as soon as possible.  You may have to wear compression stockings. These stockings help to prevent blood clots and reduce swelling in your legs.  Do not drive for 24 hours if you were given a sedative during your procedure. Summary  Transurethral resection of a bladder tumor is the removal (resection) of a cancerous growth (tumor) on the inside wall of the bladder.  To do this procedure, your health care provider uses a thin telescope with a light, a tiny camera, and an electric cutting edge (resectoscope).  Follow your health care provider's instructions. You may need to stop or change certain medicines, and you may be told to stop eating and drinking several hours before the procedure.  Your blood pressure, heart rate, breathing rate, and blood oxygen level will be monitored until you leave the hospital or clinic.  You may have to wear compression stockings. These stockings help to prevent blood clots and reduce swelling in your legs. This information is  not intended to replace advice given to you by your health care provider. Make sure you discuss any questions you have with your health care provider. Document Revised: 07/31/2017 Document Reviewed: 07/31/2017 Elsevier Patient Education  2020 [employer]  

## 2019-06-03 NOTE — Progress Notes (Signed)
   06/03/19  CC: gross hematuria  HPI: Mr Curtis Jenkins is a 84yo with a hx of EBRT for prostate cancer and gross hematuria here for cystoscopy. PSA 0.5  Blood pressure 116/67, pulse 66, temperature (!) 97.3 F (36.3 C), height 5\' 11"  (1.803 m), weight 235 lb (106.6 kg). NED. A&Ox3.   No respiratory distress   Abd soft, NT, ND Normal phallus with bilateral descended testicles  Cystoscopy Procedure Note  Patient identification was confirmed, informed consent was obtained, and patient was prepped using Betadine solution.  Lidocaine jelly was administered per urethral meatus.     Pre-Procedure: - Inspection reveals a normal caliber ureteral meatus.  Procedure: The flexible cystoscope was introduced without difficulty - No urethral strictures/lesions are present. - Enlarged prostate  - Normal bladder neck - Bilateral ureteral orifices identified - 2cm sessile done tumor - No bladder stones - No trabeculation  Retroflexion shows 2cm intravesical prostatic protrusion   Post-Procedure: - Patient tolerated the procedure well  Assessment/ Plan: Bladder tumor: management discussed including bladder tumor resection and the patient wishes to proceed. Risks/benefits/alternaitves discussed  No follow-ups on file.  Nicolette Bang, MD

## 2019-06-03 NOTE — Progress Notes (Signed)

## 2019-06-07 DIAGNOSIS — M159 Polyosteoarthritis, unspecified: Secondary | ICD-10-CM | POA: Diagnosis not present

## 2019-06-07 DIAGNOSIS — E785 Hyperlipidemia, unspecified: Secondary | ICD-10-CM | POA: Diagnosis not present

## 2019-06-07 DIAGNOSIS — R262 Difficulty in walking, not elsewhere classified: Secondary | ICD-10-CM | POA: Diagnosis not present

## 2019-06-07 DIAGNOSIS — I10 Essential (primary) hypertension: Secondary | ICD-10-CM | POA: Diagnosis not present

## 2019-06-07 DIAGNOSIS — I69391 Dysphagia following cerebral infarction: Secondary | ICD-10-CM | POA: Diagnosis not present

## 2019-06-07 DIAGNOSIS — N182 Chronic kidney disease, stage 2 (mild): Secondary | ICD-10-CM | POA: Diagnosis not present

## 2019-06-07 DIAGNOSIS — I69954 Hemiplegia and hemiparesis following unspecified cerebrovascular disease affecting left non-dominant side: Secondary | ICD-10-CM | POA: Diagnosis not present

## 2019-06-07 DIAGNOSIS — I63531 Cerebral infarction due to unspecified occlusion or stenosis of right posterior cerebral artery: Secondary | ICD-10-CM | POA: Diagnosis not present

## 2019-06-07 DIAGNOSIS — M25422 Effusion, left elbow: Secondary | ICD-10-CM | POA: Diagnosis not present

## 2019-06-07 DIAGNOSIS — M6281 Muscle weakness (generalized): Secondary | ICD-10-CM | POA: Diagnosis not present

## 2019-06-09 DIAGNOSIS — M159 Polyosteoarthritis, unspecified: Secondary | ICD-10-CM | POA: Diagnosis not present

## 2019-06-09 DIAGNOSIS — R262 Difficulty in walking, not elsewhere classified: Secondary | ICD-10-CM | POA: Diagnosis not present

## 2019-06-09 DIAGNOSIS — M6281 Muscle weakness (generalized): Secondary | ICD-10-CM | POA: Diagnosis not present

## 2019-06-09 DIAGNOSIS — E785 Hyperlipidemia, unspecified: Secondary | ICD-10-CM | POA: Diagnosis not present

## 2019-06-09 DIAGNOSIS — M25422 Effusion, left elbow: Secondary | ICD-10-CM | POA: Diagnosis not present

## 2019-06-09 DIAGNOSIS — I10 Essential (primary) hypertension: Secondary | ICD-10-CM | POA: Diagnosis not present

## 2019-06-09 DIAGNOSIS — I69391 Dysphagia following cerebral infarction: Secondary | ICD-10-CM | POA: Diagnosis not present

## 2019-06-09 DIAGNOSIS — I69954 Hemiplegia and hemiparesis following unspecified cerebrovascular disease affecting left non-dominant side: Secondary | ICD-10-CM | POA: Diagnosis not present

## 2019-06-09 DIAGNOSIS — I63531 Cerebral infarction due to unspecified occlusion or stenosis of right posterior cerebral artery: Secondary | ICD-10-CM | POA: Diagnosis not present

## 2019-06-09 DIAGNOSIS — N182 Chronic kidney disease, stage 2 (mild): Secondary | ICD-10-CM | POA: Diagnosis not present

## 2019-06-10 DIAGNOSIS — I639 Cerebral infarction, unspecified: Secondary | ICD-10-CM | POA: Diagnosis not present

## 2019-06-10 DIAGNOSIS — L8990 Pressure ulcer of unspecified site, unspecified stage: Secondary | ICD-10-CM | POA: Diagnosis not present

## 2019-06-10 DIAGNOSIS — R1310 Dysphagia, unspecified: Secondary | ICD-10-CM | POA: Diagnosis not present

## 2019-06-13 DIAGNOSIS — R1310 Dysphagia, unspecified: Secondary | ICD-10-CM | POA: Diagnosis not present

## 2019-06-13 DIAGNOSIS — I639 Cerebral infarction, unspecified: Secondary | ICD-10-CM | POA: Diagnosis not present

## 2019-06-13 DIAGNOSIS — L8990 Pressure ulcer of unspecified site, unspecified stage: Secondary | ICD-10-CM | POA: Diagnosis not present

## 2019-06-15 DIAGNOSIS — I69954 Hemiplegia and hemiparesis following unspecified cerebrovascular disease affecting left non-dominant side: Secondary | ICD-10-CM | POA: Diagnosis not present

## 2019-06-15 DIAGNOSIS — L89620 Pressure ulcer of left heel, unstageable: Secondary | ICD-10-CM | POA: Diagnosis not present

## 2019-06-15 DIAGNOSIS — E785 Hyperlipidemia, unspecified: Secondary | ICD-10-CM | POA: Diagnosis not present

## 2019-06-15 DIAGNOSIS — Z720 Tobacco use: Secondary | ICD-10-CM | POA: Diagnosis not present

## 2019-06-15 DIAGNOSIS — M25422 Effusion, left elbow: Secondary | ICD-10-CM | POA: Diagnosis not present

## 2019-06-15 DIAGNOSIS — N182 Chronic kidney disease, stage 2 (mild): Secondary | ICD-10-CM | POA: Diagnosis not present

## 2019-06-15 DIAGNOSIS — R1310 Dysphagia, unspecified: Secondary | ICD-10-CM | POA: Diagnosis not present

## 2019-06-15 DIAGNOSIS — I69391 Dysphagia following cerebral infarction: Secondary | ICD-10-CM | POA: Diagnosis not present

## 2019-06-15 DIAGNOSIS — M159 Polyosteoarthritis, unspecified: Secondary | ICD-10-CM | POA: Diagnosis not present

## 2019-06-15 DIAGNOSIS — I129 Hypertensive chronic kidney disease with stage 1 through stage 4 chronic kidney disease, or unspecified chronic kidney disease: Secondary | ICD-10-CM | POA: Diagnosis not present

## 2019-06-16 LAB — CUP PACEART REMOTE DEVICE CHECK
Date Time Interrogation Session: 20210603230431
Implantable Pulse Generator Implant Date: 20210330

## 2019-06-20 DIAGNOSIS — E785 Hyperlipidemia, unspecified: Secondary | ICD-10-CM | POA: Diagnosis not present

## 2019-06-20 DIAGNOSIS — Z720 Tobacco use: Secondary | ICD-10-CM | POA: Diagnosis not present

## 2019-06-20 DIAGNOSIS — L89620 Pressure ulcer of left heel, unstageable: Secondary | ICD-10-CM | POA: Diagnosis not present

## 2019-06-20 DIAGNOSIS — M25422 Effusion, left elbow: Secondary | ICD-10-CM | POA: Diagnosis not present

## 2019-06-20 DIAGNOSIS — R1310 Dysphagia, unspecified: Secondary | ICD-10-CM | POA: Diagnosis not present

## 2019-06-20 DIAGNOSIS — N182 Chronic kidney disease, stage 2 (mild): Secondary | ICD-10-CM | POA: Diagnosis not present

## 2019-06-20 DIAGNOSIS — I69391 Dysphagia following cerebral infarction: Secondary | ICD-10-CM | POA: Diagnosis not present

## 2019-06-20 DIAGNOSIS — M159 Polyosteoarthritis, unspecified: Secondary | ICD-10-CM | POA: Diagnosis not present

## 2019-06-20 DIAGNOSIS — I69954 Hemiplegia and hemiparesis following unspecified cerebrovascular disease affecting left non-dominant side: Secondary | ICD-10-CM | POA: Diagnosis not present

## 2019-06-20 DIAGNOSIS — I129 Hypertensive chronic kidney disease with stage 1 through stage 4 chronic kidney disease, or unspecified chronic kidney disease: Secondary | ICD-10-CM | POA: Diagnosis not present

## 2019-06-23 ENCOUNTER — Encounter (HOSPITAL_BASED_OUTPATIENT_CLINIC_OR_DEPARTMENT_OTHER): Payer: Medicare Other | Attending: Internal Medicine | Admitting: Internal Medicine

## 2019-06-23 ENCOUNTER — Other Ambulatory Visit (HOSPITAL_COMMUNITY)
Admission: RE | Admit: 2019-06-23 | Discharge: 2019-06-23 | Disposition: A | Discharge status: Home / Self Care | Payer: Medicare Other | Source: Other Acute Inpatient Hospital | Attending: Internal Medicine | Admitting: Internal Medicine

## 2019-06-23 ENCOUNTER — Other Ambulatory Visit: Payer: Self-pay

## 2019-06-23 DIAGNOSIS — I4891 Unspecified atrial fibrillation: Secondary | ICD-10-CM | POA: Diagnosis not present

## 2019-06-23 DIAGNOSIS — Z8673 Personal history of transient ischemic attack (TIA), and cerebral infarction without residual deficits: Secondary | ICD-10-CM | POA: Diagnosis not present

## 2019-06-23 DIAGNOSIS — B952 Enterococcus as the cause of diseases classified elsewhere: Secondary | ICD-10-CM | POA: Diagnosis not present

## 2019-06-23 DIAGNOSIS — Z923 Personal history of irradiation: Secondary | ICD-10-CM | POA: Insufficient documentation

## 2019-06-23 DIAGNOSIS — I70244 Atherosclerosis of native arteries of left leg with ulceration of heel and midfoot: Secondary | ICD-10-CM | POA: Diagnosis not present

## 2019-06-23 DIAGNOSIS — Z8546 Personal history of malignant neoplasm of prostate: Secondary | ICD-10-CM | POA: Diagnosis not present

## 2019-06-23 DIAGNOSIS — Z7901 Long term (current) use of anticoagulants: Secondary | ICD-10-CM | POA: Insufficient documentation

## 2019-06-23 DIAGNOSIS — I10 Essential (primary) hypertension: Secondary | ICD-10-CM | POA: Diagnosis not present

## 2019-06-23 DIAGNOSIS — I70202 Unspecified atherosclerosis of native arteries of extremities, left leg: Secondary | ICD-10-CM | POA: Diagnosis not present

## 2019-06-23 DIAGNOSIS — B957 Other staphylococcus as the cause of diseases classified elsewhere: Secondary | ICD-10-CM | POA: Insufficient documentation

## 2019-06-23 DIAGNOSIS — L89620 Pressure ulcer of left heel, unstageable: Secondary | ICD-10-CM | POA: Insufficient documentation

## 2019-06-23 DIAGNOSIS — G603 Idiopathic progressive neuropathy: Secondary | ICD-10-CM | POA: Diagnosis not present

## 2019-06-23 DIAGNOSIS — L03116 Cellulitis of left lower limb: Secondary | ICD-10-CM | POA: Diagnosis not present

## 2019-06-23 NOTE — Progress Notes (Signed)
FROYLAN, HOBBY ([id number]) Visit Report for 06/23/2019 Abuse/Suicide Risk Screen Details Patient Name: Date of Service: MA Curtis Jenkins [place] RD G. 06/23/2019 2:45 PM Medical Record Number: [id number] Patient Account Number: [id number] Date of Birth/Sex: Treating RN: 30-Jan-1935 (84 y.o. Curtis Jenkins Primary Care Curtis Jenkins: [employer] Other Clinician: Referring Overton Boggus: Treating Disaya Walt/Extender: Garfield Cornea in Treatment: 0 Abuse/Suicide Risk Screen Items Answer ABUSE RISK SCREEN: Has anyone close to you tried to hurt or harm you recentlyo No Do you feel uncomfortable with anyone in your familyo No Has anyone forced you do things that you didnt want to doo No Electronic Signature(s) Signed: 06/23/2019 4:59:25 PM By: Kela Millin Entered By: Kela Millin on 06/23/2019 15:17:09 -------------------------------------------------------------------------------- Activities of Daily Living Details Patient Name: Date of Service: Curtis Jenkins [place] RD G. 06/23/2019 2:45 PM Medical Record Number: [id number] Patient Account Number: [id number] Date of Birth/Sex: Treating RN: [birth year] (85 y.o. Curtis Jenkins Primary Care Allena Pietila: [employer] Other Clinician: Referring Marlowe Lawes: Treating Opal Dinning/Extender: Garfield Cornea in Treatment: 0 Activities of Daily Living Items Answer Activities of Daily Living (Please select one for each item) Drive Automobile Not Able T Medications ake Need Assistance Use T elephone Need Assistance Care for Appearance Need Assistance Use T oilet Need Assistance Bath / Shower Need Assistance Dress Self Need Assistance Feed Self Need Assistance Walk Need Assistance Get In / Out Bed Need Assistance Housework Not Able Prepare Meals Not Able Handle Money Not Able Shop for Self Not Able Electronic Signature(s) Signed: 06/23/2019 4:59:25 PM By: Kela Millin Entered By: Kela Millin on 06/23/2019 15:17:39 -------------------------------------------------------------------------------- Education Screening Details Patient Name: Date of Service: MA Curtis Jenkins [place] RD G. 06/23/2019 2:45 PM Medical Record Number: [id number] Patient Account Number: [id number] Date of Birth/Sex: Treating RN: 1935/11/26 (84 y.o. Curtis Jenkins Primary Care Zakira Ressel: [employer] Other Clinician: Referring Sheran Newstrom: Treating Addisen Chappelle/Extender: Garfield Cornea in Treatment: 0 Primary Learner Assessed: Patient Learning Preferences/Education Level/Primary Language Learning Preference: Explanation Highest Education Level: High School Preferred Language: English Cognitive Barrier Language Barrier: No Translator Needed: No Memory Deficit: No Emotional Barrier: No Cultural/Religious Beliefs Affecting Medical Care: No Physical Barrier Impaired Vision: No Impaired Hearing: No Decreased Hand dexterity: No Knowledge/Comprehension Knowledge Level: Medium Comprehension Level: Medium Ability to understand written instructions: Medium Ability to understand verbal instructions: Medium Motivation Anxiety Level: Calm Cooperation: Cooperative Education Importance: Acknowledges Need Interest in Health Problems: Asks Questions Perception: Coherent Willingness to Engage in Self-Management High Activities: Readiness to Engage in Self-Management High Activities: Electronic Signature(s) Signed: 06/23/2019 4:59:25 PM By: Kela Millin Entered By: Kela Millin on 06/23/2019 15:18:04 -------------------------------------------------------------------------------- Fall Risk Assessment Details Patient Name: Date of Service: MA Curtis Jenkins NA RD G. 06/23/2019 2:45 PM Medical Record Number: [id number] Patient Account Number: [id number] Date of Birth/Sex: Treating RN: [birth year] (84 y.o. Curtis Jenkins Primary Care Zeke Aker: [employer] Other  Clinician: Referring Mouhamad Teed: Treating Kimbley Sprague/Extender: Garfield Cornea in Treatment: 0 Fall Risk Assessment Items Have you had 2 or more falls in the last 12 monthso 0 Yes Have you had any fall that resulted in injury in the last 12 monthso 0 No FALLS RISK SCREEN History of falling - immediate or within 3 months 25 Yes Secondary diagnosis (Do you have 2 or more medical diagnoseso) 15 Yes Ambulatory aid None/bed rest/wheelchair/nurse 0 Yes Crutches/cane/walker 0 No Furniture 0 No Intravenous therapy Access/Saline/Heparin Lock 0 No Gait/Transferring Normal/ bed rest/ wheelchair 0 No Weak (short steps with or  without shuffle, stooped but able to lift head while walking, may seek 0 No support from furniture) Impaired (short steps with shuffle, may have difficulty arising from chair, head down, impaired 20 Yes balance) Mental Status Oriented to own ability 0 Yes Electronic Signature(s) Signed: 06/23/2019 4:59:25 PM By: Kela Millin Entered By: Kela Millin on 06/23/2019 15:18:44 -------------------------------------------------------------------------------- Foot Assessment Details Patient Name: Date of Service: MA Curtis Jenkins NA RD G. 06/23/2019 2:45 PM Medical Record Number: [id number] Patient Account Number: [id number] Date of Birth/Sex: Treating RN: 03-Jun-1935 (84 y.o. Curtis Jenkins Primary Care Taline Nass: [employer] Other Clinician: Referring Sebastion Jun: Treating Alaiah Lundy/Extender: Garfield Cornea in Treatment: 0 Foot Assessment Items Site Locations + = Sensation present, - = Sensation absent, C = Callus, U = Ulcer R = Redness, W = Warmth, M = Maceration, PU = Pre-ulcerative lesion F = Fissure, S = Swelling, D = Dryness Assessment Right: Left: Other Deformity: No No Prior Foot Ulcer: No No Prior Amputation: No No Charcot Joint: No No Ambulatory Status: Ambulatory With Help Assistance Device:  Wheelchair Gait: Buyer, retail Signature(s) Signed: 06/23/2019 4:59:25 PM By: Kela Millin Entered By: Kela Millin on 06/23/2019 15:21:01 -------------------------------------------------------------------------------- Nutrition Risk Screening Details Patient Name: Date of Service: Curtis Jenkins NA RD G. 06/23/2019 2:45 PM Medical Record Number: [id number] Patient Account Number: [id number] Date of Birth/Sex: Treating RN: November 13, 1935 (84 y.o. Curtis Jenkins Primary Care Kerin Cecchi: [employer] Other Clinician: Referring Kodee Drury: Treating Lilyauna Miedema/Extender: Garfield Cornea in Treatment: 0 Height (in): 71 Weight (lbs): 250 Body Mass Index (BMI): 34.9 Nutrition Risk Screening Items Score Screening NUTRITION RISK SCREEN: I have an illness or condition that made me change the kind and/or amount of food I eat 0 No I eat fewer than two meals per day 0 No I eat few fruits and vegetables, or milk products 0 No I have three or more drinks of beer, liquor or wine almost every day 0 No I have tooth or mouth problems that make it hard for me to eat 0 No I don't always have enough money to buy the food I need 0 No I eat alone most of the time 0 No I take three or more different prescribed or over-the-counter drugs a day 1 Yes Without wanting to, I have lost or gained 10 pounds in the last six months 0 No I am not always physically able to shop, cook and/or feed myself 0 No Nutrition Protocols Good Risk Protocol 0 No interventions needed Moderate Risk Protocol High Risk Proctocol Risk Level: Good Risk Score: 1 Electronic Signature(s) Signed: 06/23/2019 4:59:25 PM By: Kela Millin Entered By: Kela Millin on 06/23/2019 15:18:57

## 2019-06-24 DIAGNOSIS — M25422 Effusion, left elbow: Secondary | ICD-10-CM | POA: Diagnosis not present

## 2019-06-24 DIAGNOSIS — E785 Hyperlipidemia, unspecified: Secondary | ICD-10-CM | POA: Diagnosis not present

## 2019-06-24 DIAGNOSIS — M159 Polyosteoarthritis, unspecified: Secondary | ICD-10-CM | POA: Diagnosis not present

## 2019-06-24 DIAGNOSIS — L89620 Pressure ulcer of left heel, unstageable: Secondary | ICD-10-CM | POA: Diagnosis not present

## 2019-06-24 DIAGNOSIS — I69954 Hemiplegia and hemiparesis following unspecified cerebrovascular disease affecting left non-dominant side: Secondary | ICD-10-CM | POA: Diagnosis not present

## 2019-06-24 DIAGNOSIS — I69391 Dysphagia following cerebral infarction: Secondary | ICD-10-CM | POA: Diagnosis not present

## 2019-06-24 DIAGNOSIS — I129 Hypertensive chronic kidney disease with stage 1 through stage 4 chronic kidney disease, or unspecified chronic kidney disease: Secondary | ICD-10-CM | POA: Diagnosis not present

## 2019-06-24 DIAGNOSIS — R1310 Dysphagia, unspecified: Secondary | ICD-10-CM | POA: Diagnosis not present

## 2019-06-24 DIAGNOSIS — Z720 Tobacco use: Secondary | ICD-10-CM | POA: Diagnosis not present

## 2019-06-24 DIAGNOSIS — N182 Chronic kidney disease, stage 2 (mild): Secondary | ICD-10-CM | POA: Diagnosis not present

## 2019-06-27 ENCOUNTER — Other Ambulatory Visit (HOSPITAL_COMMUNITY): Payer: Self-pay | Admitting: Internal Medicine

## 2019-06-27 ENCOUNTER — Other Ambulatory Visit: Payer: Self-pay | Admitting: Internal Medicine

## 2019-06-27 ENCOUNTER — Ambulatory Visit (HOSPITAL_COMMUNITY)
Admission: RE | Admit: 2019-06-27 | Discharge: 2019-06-27 | Disposition: A | Discharge status: Home / Self Care | Payer: Medicare Other | Source: Ambulatory Visit | Attending: Internal Medicine | Admitting: Internal Medicine

## 2019-06-27 ENCOUNTER — Other Ambulatory Visit: Payer: Self-pay

## 2019-06-27 DIAGNOSIS — M159 Polyosteoarthritis, unspecified: Secondary | ICD-10-CM | POA: Diagnosis not present

## 2019-06-27 DIAGNOSIS — N182 Chronic kidney disease, stage 2 (mild): Secondary | ICD-10-CM | POA: Diagnosis not present

## 2019-06-27 DIAGNOSIS — I129 Hypertensive chronic kidney disease with stage 1 through stage 4 chronic kidney disease, or unspecified chronic kidney disease: Secondary | ICD-10-CM | POA: Diagnosis not present

## 2019-06-27 DIAGNOSIS — Z720 Tobacco use: Secondary | ICD-10-CM | POA: Diagnosis not present

## 2019-06-27 DIAGNOSIS — M25422 Effusion, left elbow: Secondary | ICD-10-CM | POA: Diagnosis not present

## 2019-06-27 DIAGNOSIS — I69391 Dysphagia following cerebral infarction: Secondary | ICD-10-CM | POA: Diagnosis not present

## 2019-06-27 DIAGNOSIS — R0989 Other specified symptoms and signs involving the circulatory and respiratory systems: Secondary | ICD-10-CM

## 2019-06-27 DIAGNOSIS — L89620 Pressure ulcer of left heel, unstageable: Secondary | ICD-10-CM | POA: Insufficient documentation

## 2019-06-27 DIAGNOSIS — I69954 Hemiplegia and hemiparesis following unspecified cerebrovascular disease affecting left non-dominant side: Secondary | ICD-10-CM | POA: Diagnosis not present

## 2019-06-27 DIAGNOSIS — R1310 Dysphagia, unspecified: Secondary | ICD-10-CM | POA: Diagnosis not present

## 2019-06-27 DIAGNOSIS — E785 Hyperlipidemia, unspecified: Secondary | ICD-10-CM | POA: Diagnosis not present

## 2019-06-27 LAB — AEROBIC CULTURE W GRAM STAIN (SUPERFICIAL SPECIMEN)

## 2019-06-27 NOTE — Progress Notes (Signed)
DREXEL, IVEY ([id number]) Visit Report for 06/23/2019 Chief Complaint Document Details Patient Name: Date of Service: MA Curtis Jenkins [place] RD G. 06/23/2019 2:45 PM Medical Record Number: [id number] Patient Account Number: [id number] Date of Birth/Sex: Treating RN: 1935-10-29 (84 y.o. Curtis Jenkins Primary Care Provider: [employer] Other Clinician: Referring Provider: Treating Provider/Extender: Garfield Cornea in Treatment: 0 Information Obtained from: Patient Chief Complaint 06/23/19; Here for review of pressure ulcer left heel Electronic Signature(s) Signed: 06/27/2019 8:32:12 AM By: Linton Ham MD Entered By: Linton Ham on 06/23/2019 16:33:14 -------------------------------------------------------------------------------- Debridement Details Patient Name: Date of Service: MA Curtis Jenkins NA RD G. 06/23/2019 2:45 PM Medical Record Number: [id number] Patient Account Number: [id number] Date of Birth/Sex: Treating RN: 03-02-1935 (84 y.o. [name], Meta.Reding Primary Care Provider: [employer] Other Clinician: Referring Provider: Treating Provider/Extender: Garfield Cornea in Treatment: 0 Debridement Performed for Assessment: Wound #1 Left,Lateral Calcaneus Performed By: Physician Ricard Dillon., MD Debridement Type: Debridement Level of Consciousness (Pre-procedure): Awake and Alert Pre-procedure Verification/Time Out Yes - 15:55 Taken: Start Time: 15:56 Pain Control: Lidocaine 4% T opical Solution T Area Debrided (L x W): otal 3.4 (cm) x 3.6 (cm) = 12.24 (cm) Tissue and other material debrided: Viable, Non-Viable, Eschar, Muscle, Slough, Subcutaneous, Fibrin/Exudate, Slough Level: Skin/Subcutaneous Tissue/Muscle Debridement Description: Excisional Instrument: Blade, Curette Specimen: Swab, Number of Specimens T aken: 1 Bleeding: Minimum Hemostasis Achieved: Pressure End Time: 16:03 Procedural Pain: 0 Post  Procedural Pain: 0 Response to Treatment: Procedure was tolerated well Level of Consciousness (Post- Awake and Alert procedure): Post Debridement Measurements of Total Wound Length: (cm) 3.4 Stage: Unstageable/Unclassified Width: (cm) 3.6 Depth: (cm) 1.6 Volume: (cm) 15.381 Character of Wound/Ulcer Post Debridement: Requires Further Debridement Post Procedure Diagnosis Same as Pre-procedure Electronic Signature(s) Signed: 06/23/2019 5:25:48 PM By: Deon Pilling Signed: 06/27/2019 8:32:12 AM By: Linton Ham MD Entered By: Linton Ham on 06/23/2019 16:31:45 -------------------------------------------------------------------------------- HPI Details Patient Name: Date of Service: MA Curtis Jenkins NA RD G. 06/23/2019 2:45 PM Medical Record Number: [id number] Patient Account Number: [id number] Date of Birth/Sex: Treating RN: [birth year] (84 y.o. Curtis Jenkins Primary Care Provider: [employer] Other Clinician: Referring Provider: Treating Provider/Extender: Garfield Cornea in Treatment: 0 History of Present Illness HPI Description: ADMISSION 06/23/19 This is an 84 year old man who lives in [place]. He was admitted to hospital in March with altered functional level. 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 He came home with physical therapy at some point he was discovered that he had pressure ulcer on the left heel which started as a blister. He has [facility] going out to change dressings. They have been using [facility]. The patient is minimally ambulatory only for transfers. They have been offloading the heel by using pillows etc. He spends most of his day in bed or in a recliner. 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 His daughter lives next door and there are other family members close by. ABI in our clinic was 0.77 on the left Electronic Signature(s) Signed: 06/27/2019 8:32:12 AM By: Linton Ham MD Entered By: Linton Ham on 06/23/2019 16:38:05 -------------------------------------------------------------------------------- Physical Exam Details Patient Name: Date of Service: [name] NA RD G. 06/23/2019 2:45 PM Medical Record Number: [id number] Patient Account Number: [id number] Date  of Birth/Sex: Treating RN: March 12, 1935 (84 y.o. Curtis Jenkins Primary Care Provider: [employer] Other Clinician: Referring Provider: Treating Provider/Extender: Garfield Cornea in Treatment: 0 Constitutional Sitting or standing Blood Pressure is within target range for patient.. Pulse regular and within target range for patient.Marland Kitchen Respirations regular, non-labored and within target range.. Temperature is normal and within the target range for the patient.Marland Kitchen Appears in no distress. Respiratory work of breathing is normal. Cardiovascular He has palpable popliteal pulses more vibrant on the right.. Pedal pulses absent bilaterally.. Neurological Complete absent vibration sense to the level of the ankle. This is bilaterally also reduced to absent microfilament light touch. Notes Wound exam; left lateral calcaneus area was covered and is separating black eschar. I used pickups and a #15 scalpel to remove this and then copious amounts of necrotic material removed with a #5 curette. I suspect I was into muscle layer in terms of necrosis there is no exposed bone but I am still not down to a level that I would consider healthy. He has some undermining towards the distal part of the foot. A separate culture was obtained post debridement Also concerning is that there was erythema spreading from this wound into the mid heel area. We marked this  with marker. Electronic Signature(s) Signed: 06/27/2019 8:32:12 AM By: Linton Ham MD Entered By: Linton Ham on 06/23/2019 16:40:48 -------------------------------------------------------------------------------- Physician Orders Details Patient Name: Date of Service: MA Curtis Jenkins NA RD G. 06/23/2019 2:45 PM Medical Record Number: [id number] Patient Account Number: [id number] Date of Birth/Sex: Treating RN: 1935-12-11 (84 y.o. Curtis Jenkins Primary Care Provider: [employer] Other Clinician: Referring Provider: Treating Provider/Extender: Garfield Cornea in Treatment: 0 Verbal / Phone Orders: No Diagnosis Coding Follow-up Appointments ppointment in: - Tuesday Return A Dressing Change Frequency Change dressing every day. - home health change twice a week. wound center weekly. all other days family to change. Wound Cleansing Clean wound with Wound Cleanser - or wash with soap and water. Primary Wound Dressing Wound #1 Left,Lateral Calcaneus Calcium Alginate with Silver Secondary Dressing Kerlix/Rolled Gauze Dry Gauze Heel Cup Edema Control Elevate legs to the level of the heart or above for 30 minutes daily and/or when sitting, a frequency of: - throughout the day. Off-Loading Other: - float heels with bunny boots or pillows while rest in bed or chair to aid in offloading pressure to heels. Additional Orders / Instructions Follow Nutritious Diet - encourage patient to eat. Try to increase protein and vegetables to aid in wound heailng. [facility] for [facility] care. - [facility] twice a [facility]. wound center weekly. Laboratory naerobe culture (MICRO) - culture of left heel wound. Bacteria identified in Unspecified specimen by A LOINC Code: 160-1 Convenience Name: Anerobic culture Radiology X-ray, heel left - x-ray of left heel related to non healing pressure ulcer. ICD 10 code CPT code Services  and Therapies rterial Studies- Bilateral -  Imaging. Arterial studies with dopplers ABIs and TBIs of both legs related to poor palpable pulses and non A healing ulcer to left heel. ICD 10 code CPT code Patient Medications llergies: No Known Allergies A Notifications Medication Indication Start End lidocaine DOSE topical 4 % gel - gel topical applied only in clinic before debridement by MD. left heel wound 06/23/2019 Augmentin infection DOSE oral 875 mg-125 mg tablet - 1 tablet oral bid for 7 days Electronic Signature(s) Signed: 06/23/2019 4:42:07 PM By: Linton Ham MD Entered By: Linton Ham  on 06/23/2019 16:42:06 Prescription 06/23/2019 -------------------------------------------------------------------------------- Marcelino Scot Linton Ham MD Patient Name: Provider: 06/22/1935 [id number] Date of Birth: NPI#: Jerilynn Mages [id number] Sex: DEA #: [phone] [id number] Phone #: License #: [place] Patient Address: [address] [place], [place] [address] [place], [place] [address] [phone] Allergies Name Reaction Severity No Known Allergies Provider's Orders rterial Studies- Bilateral - [facility]. Arterial studies with dopplers ABIs and TBIs of both legs related to poor palpable pulses and non A healing ulcer to left heel. ICD 10 code CPT code Hand Signature: Date(s): Prescription 06/23/2019 Marcelino Scot. Linton Ham MD Patient Name: Provider: [birth year] [id number] Date of Birth: NPI#: Jerilynn Mages [id number] Sex: DEA #: [phone] [id number] Phone #: License #: [place] Patient Address: [address] [place], [place] [address] [place], [place] [address] [phone] Allergies Name Reaction Severity No Known Allergies Provider's Orders X-ray, heel left - x-ray of left heel related to non healing pressure  ulcer. ICD 10 code CPT code Hand Signature: Date(s): Prescription 06/23/2019 Marcelino Scot. Linton Ham MD Patient Name: Provider: Apr 08, 1935 [id number] Date of Birth: NPI#: Jerilynn Mages [id number] Sex: DEA #: [phone] [id number] Phone #: License #: [place] Patient Address: [address] [place], [place] [address] [place], [place] [address] [phone] Allergies Name Reaction Severity No Known Allergies Medication Medication: Route: Strength: Form: lidocaine 4 % topical gel topical 4% gel Class: TOPICAL LOCAL ANESTHETICS Dose: Frequency / Time: Indication: gel topical applied only in clinic before debridement by MD. Number of Refills: Number of Units: 0 Generic Substitution: Start Date: End Date: One Time Use: Substitution Permitted No Note to Pharmacy: Hand Signature: Date(s): Electronic Signature(s) Signed: 06/27/2019 8:32:12 AM By: Linton Ham MD Entered By: Linton Ham on 06/23/2019 16:42:08 -------------------------------------------------------------------------------- Problem List Details Patient Name: Date of Service: [name] NA RD G. 06/23/2019 2:45 PM Medical Record Number: [id number] Patient Account Number: [id number] Date of Birth/Sex: Treating RN: 1935/01/29 (84 y.o. 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 06/23/2019 2:45 PM Medical Record Number: [id number] Patient Account Number: [id number] Date of Birth/Sex: Treating RN: [birth year] (84 y.o. Curtis Jenkins Primary Care Provider: [employer] Other Clinician: Referring Provider: Treating Provider/Extender: Garfield Cornea in Treatment: 0 Subjective Chief Complaint Information obtained from Patient 06/23/19; Here for review of pressure ulcer left heel History of Present Illness (HPI) ADMISSION 06/23/19 This is an 84 year old man who lives in [place]. He was admitted to hospital in March with altered functional level. 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 He came home with physical therapy at some point he was discovered that he had pressure ulcer on the left heel which started as a blister.  He has [facility] going out to change dressings. They have been using [facility]. The patient is minimally ambulatory only for transfers. They have been offloading the heel by using pillows etc. He spends most of his day in bed or in a recliner. 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 His daughter lives next door and there are other family members close by. ABI in our clinic was 0.77 on the left Patient History Allergies No Known Allergies Family History Cancer - Mother, No family history of Diabetes, Heart Disease, Hereditary Spherocytosis, Hypertension, Kidney Disease, Lung Disease,  Seizures, Thyroid Problems, Tuberculosis. Social History Never smoker - chews tabacco, Marital Status - Married, Alcohol Use - Never, Drug Use - No History, Caffeine Use - Rarely. Medical History Hospitalization/Surgery History - srtoke, 03/2019. Objective Constitutional Sitting or standing Blood Pressure is within target range for patient.. Pulse regular and within target range for patient.Marland Kitchen Respirations regular, non-labored and within target range.. Temperature is normal and within the target range for the patient.Marland Kitchen Appears in no distress. Vitals Time Taken: 3:00 PM, Height: 71 in, Source: Stated, Weight: 250 lbs, Source: Stated, BMI: 34.9, Temperature: 98.4 F, Pulse: 67 bpm, Respiratory Rate: 19 breaths/min, Blood Pressure: 127/82 mmHg. Respiratory work of breathing is normal. Cardiovascular He has palpable popliteal pulses more vibrant on the right.. Pedal pulses absent bilaterally.. Neurological Complete absent vibration sense to the level of the ankle. This is bilaterally also reduced to absent microfilament light touch. General Notes: Wound exam; left lateral calcaneus area was covered and is separating black eschar. I used pickups and a #15 scalpel to remove this and then copious amounts of necrotic material removed with a #5 curette. I suspect I was into muscle layer in terms of necrosis there is no exposed bone but I am still not down to a level that I would consider healthy. He has some undermining towards the distal part of the foot. A separate culture was obtained post debridement ooAlso concerning is that there was erythema spreading from this wound into the mid heel area. We marked this with marker. Integumentary (Hair, Skin) Wound #1 status is Open. Original cause of wound was Blister. The wound is located on the Left,Lateral Calcaneus. The wound measures 3.4cm length x 3.6cm width x 1.6cm depth; 9.613cm^2 area and 15.381cm^3 volume. There is Fat Layer (Subcutaneous Tissue)  Exposed exposed. There is no tunneling or undermining noted. There is a medium amount of purulent drainage noted. Foul odor after cleansing was noted. The wound margin is well defined and not attached to the wound base. There is small (1-33%) pink granulation within the wound bed. There is a large (67-100%) amount of necrotic tissue within the wound bed including Eschar and Adherent Slough. 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 There was a Excisional Skin/Subcutaneous Tissue/Muscle Debridement with a total area of 12.24 sq cm performed by Ricard Dillon., MD. With the following instrument(s): Blade, and Curette to remove Viable and Non-Viable tissue/material. Material removed includes Muscle, Eschar, Subcutaneous Tissue, Slough, and Fibrin/Exudate after achieving pain control using Lidocaine 4% T  opical Solution. 1 specimen was taken by a Swab and sent to the lab per facility protocol. A time out was conducted at 15:55, prior to the start of the procedure. A Minimum amount of bleeding was controlled with Pressure. The procedure was tolerated well with a pain level of 0 throughout and a pain level of 0 following the procedure. Post Debridement Measurements: 3.4cm length x 3.6cm width x 1.6cm depth; 15.381cm^3 volume. Post debridement Stage noted as Unstageable/Unclassified. Character of Wound/Ulcer Post Debridement requires further debridement. Post procedure Diagnosis Wound #1: Same as Pre-Procedure Plan Follow-up Appointments: Return Appointment in: - Tuesday Dressing Change Frequency: Change dressing every day. - home health change twice a week. wound center weekly. all other days family to  change. Wound Cleansing: Clean wound with Wound Cleanser - or wash with soap and water. Primary Wound Dressing: Wound #1 Left,Lateral Calcaneus: Calcium Alginate with Silver Secondary Dressing: Kerlix/Rolled Gauze Dry Gauze Heel Cup Edema Control: Elevate legs to the level of the heart or above for 30 minutes daily and/or when sitting, a frequency of: - throughout the day. Off-Loading: Other: - float heels with bunny boots or pillows while rest in bed or chair to aid in offloading pressure to heels. Additional Orders / Instructions: Follow Nutritious Diet - encourage patient to eat. Try to increase protein and vegetables to aid in wound heailng. Home Health: [facility] for wound care. - [facility] twice [facility]. wound center weekly. Services and Therapies ordered were: Arterial Studies- Bilateral - [facility]. Arterial studies with dopplers ABIs and TBIs of both legs related to poor palpable pulses and non healing ulcer to left heel. ICD 10 code CPT code Laboratory ordered were: Anerobic culture - culture of left heel wound. Radiology ordered were: X-ray, heel left - x-ray of left heel related to non healing pressure ulcer. ICD 10 code CPT code The following medication(s) was prescribed: lidocaine topical 4 % gel gel topical applied only in clinic before debridement by MD. was prescribed at facility Augmentin oral 875 mg-125 mg tablet 1 tablet oral bid for 7 days for left heel wound infection starting 06/23/2019 1. Pressure ulcer left heel as described. This is a dangerous deep necrotic wound post debridement. 2. Quite confident he has surrounding soft tissue infection I have marked this area and prescribed Augmentin while we await a culture that was done of purulent drainage in the wound bed 3. This does not go to bone nevertheless he needs an x-ray 4. ABIs were 0.77 in our clinic but the peripheral pulses in the left foot indeed both feet  are very difficult to feel lab ordered formal arterial study 5. Silver alginate to the wound change daily until we can get a better looking wound surface and control of the infection 6. I have cautioned the patient his daughter that should there be worsening erythema spreading from the wound area, systemic symptoms he may need to seek urgent medical attention 7. The patient is not a diabetic but appears to have some form of neuropathy probably idiopathic 8. His daughter tells me the patient is not eating well. He appears frail. I spent 45 minutes in review of this patient's past medical history, face-to-face evaluation and preparation of this record Electronic Signature(s) Signed: 06/27/2019 8:32:12 AM By: Linton Ham MD Entered By: Linton Ham on 06/23/2019 16:45:28 -------------------------------------------------------------------------------- HxROS Details Patient Name: Date of Service: [name] NA RD G. 06/23/2019 2:45 PM Medical Record Number: [id number] Patient Account Number: [id number]  Date of Birth/Sex: Treating RN: 08-25-1935 (84 y.o. [name] Primary Care Provider: [employer] Other Clinician: Referring Provider: Treating Provider/Extender: Garfield Cornea in Treatment: 0 Immunizations Pneumococcal Vaccine: Received Pneumococcal Vaccination: No Implantable Devices Yes Hospitalization / Surgery History Type of Hospitalization/Surgery srtoke, 03/2019 Family and Social History Cancer: Yes - Mother; Diabetes: No; Heart Disease: No; Hereditary Spherocytosis: No; Hypertension: No; Kidney Disease: No; Lung Disease: No; Seizures: No; Thyroid Problems: No; Tuberculosis: No; Never smoker - chews tabacco; Marital Status - Married; Alcohol Use: Never; Drug Use: No History; Caffeine Use: Rarely; Financial Concerns: No; Food, Clothing or Shelter Needs: No; Support System Lacking: No; Transportation Concerns: No Electronic  Signature(s) Signed: 06/23/2019 4:59:25 PM By: Kela Millin Signed: 06/27/2019 8:32:12 AM By: Linton Ham MD Entered By: Kela Millin on 06/23/2019 15:16:53 -------------------------------------------------------------------------------- SuperBill Details Patient Name: Date of Service: MA Curtis Jenkins NA RD G. 06/23/2019 Medical Record Number: [id number] Patient Account Number: [id number] Date of Birth/Sex: Treating RN: [birth year] (84 y.o. 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 G60.3 Idiopathic progressive neuropathy Facility Procedures CPT4 Code: [id number] Description: 15947 - WOUND CARE VISIT-LEV 4 EST PT Modifier: Quantity: 1 CPT4 Code: [id number] Description: 43735 - DEB MUSC/FASCIA 20 SQ CM/< ICD-10 Diagnosis Description L89.620 Pressure ulcer of left heel, unstageable Modifier: Quantity: 1 Physician Procedures : CPT4 Code Description Modifier [id number] 99204 - WC PHYS LEVEL 4 - NEW PT 25 ICD-10 Diagnosis Description L89.620 Pressure ulcer of left heel, unstageable L03.116 Cellulitis of left lower limb I70.244 Atherosclerosis of native arteries of left leg with  ulceration of heel and midfoot G60.3 Idiopathic progressive neuropathy Quantity: 1 : [id number] 11043 - WC PHYS DEBR MUSCLE/FASCIA 20 SQ CM ICD-10 Diagnosis Description L89.620 Pressure ulcer of left heel, unstageable Quantity: 1 Electronic Signature(s) Signed: 06/27/2019 8:32:12 AM By: Linton Ham MD Entered By: Linton Ham on 06/23/2019 16:44:50

## 2019-06-27 NOTE — Progress Notes (Signed)
LEVORN, OLESKI ([id number]) Visit Report for 06/23/2019 Allergy List Details Patient Name: Date of Service: MA Curtis Jenkins [place] RD G. 06/23/2019 2:45 PM Medical Record Number: [id number] Patient Account Number: [id number] Date of Birth/Sex: Treating RN: 1935-10-21 (84 y.o. Curtis Jenkins Primary Care Kenasia Scheller: [employer] Other Clinician: Referring Mareo Portilla: Treating Tia Gelb/Extender: Garfield Cornea in Treatment: 0 Allergies Active Allergies No Known Allergies Allergy Notes Electronic Signature(s) Signed: 06/23/2019 4:59:25 PM By: Kela Millin Entered By: Kela Millin on 06/23/2019 15:10:59 -------------------------------------------------------------------------------- Arrival Information Details Patient Name: Date of Service: MA Curtis Jenkins NA RD G. 06/23/2019 2:45 PM Medical Record Number: [id number] Patient Account Number: [id number] Date of Birth/Sex: Treating RN: 1935-04-02 (84 y.o. Curtis Jenkins Primary Care Adelia Baptista: [employer] Other Clinician: Referring Ausha Sieh: Treating Adelard Sanon/Extender: Garfield Cornea in Treatment: 0 Visit Information Patient Arrived: Wheel Chair Arrival Time: 15:03 Accompanied By: daughter Transfer Assistance: Manual Patient Identification Verified: Yes Secondary Verification Process Completed: Yes Patient Requires Transmission-Based Precautions: No Patient Has Alerts: Yes Patient Alerts: Left ABI: 0.77 Electronic Signature(s) Signed: 06/23/2019 4:59:25 PM By: Kela Millin Entered By: Kela Millin on 06/23/2019 15:38:18 -------------------------------------------------------------------------------- Clinic Level of Care Assessment Details Patient Name: Date of Service: MA Curtis Jenkins [place] RD G. 06/23/2019 2:45 PM Medical Record Number: [id number] Patient Account Number: [id number] Date of Birth/Sex: Treating RN: [birth year] (84 y.o. Curtis Jenkins Primary Care  Ariyan Sinnett: [employer] Other Clinician: Referring Ashni Lonzo: Treating Raygen Dahm/Extender: Garfield Cornea in Treatment: 0 Clinic Level of Care Assessment Items TOOL 1 Quantity Score X- 1 0 Use when EandM and Procedure is performed on INITIAL visit ASSESSMENTS - Nursing Assessment / Reassessment X- 1 20 General Physical Exam (combine w/ comprehensive assessment (listed just below) when performed on new pt. evals) X- 1 25 Comprehensive Assessment (HX, ROS, Risk Assessments, Wounds Hx, etc.) ASSESSMENTS - Wound and Skin Assessment / Reassessment X- 1 10 Dermatologic / Skin Assessment (not related to wound area) ASSESSMENTS - Ostomy and/or Continence Assessment and Care []  - 0 Incontinence Assessment and Management []  - 0 Ostomy Care Assessment and Management (repouching, etc.) PROCESS - Coordination of Care []  - 0 Simple Patient / Family Education for ongoing care X- 1 20 Complex (extensive) Patient / Family Education for ongoing care X- 1 10 Staff obtains Programmer, systems, Records, T Results / Process Orders est X- 1 10 Staff telephones HHA, Nursing Homes / Clarify orders / etc []  - 0 Routine Transfer to another Facility (non-emergent condition) []  - 0 Routine Hospital Admission (non-emergent condition) X- 1 15 New Admissions / Biomedical engineer / Ordering NPWT Apligraf, etc. , []  - 0 Emergency Hospital Admission (emergent condition) PROCESS - Special Needs []  - 0 Pediatric / Minor Patient Management []  - 0 Isolation Patient Management []  - 0 Hearing / Language / Visual special needs []  - 0 Assessment of Community assistance (transportation, D/C planning, etc.) []  - 0 Additional assistance / Altered mentation []  - 0 Support Surface(s) Assessment (bed, cushion, seat, etc.) INTERVENTIONS - Miscellaneous []  - 0 External ear exam []  - 0 Patient Transfer (multiple staff / Civil Service fast streamer / Similar devices) []  - 0 Simple Staple / Suture removal (25  or less) []  - 0 Complex Staple / Suture removal (26 or more) []  - 0 Hypo/Hyperglycemic Management (do not check if billed separately) X- 1 15 Ankle / Brachial Index (ABI) - do not check if billed separately Has the patient been seen at the hospital within the last three years: Yes Total Score:  125 Level Of Care: New/Established - Level 4 Electronic Signature(s) Signed: 06/23/2019 5:25:48 PM By: Deon Pilling Entered By: Deon Pilling on 06/23/2019 16:34:14 -------------------------------------------------------------------------------- Encounter Discharge Information Details Patient Name: Date of Service: MA Curtis Jenkins NA RD G. 06/23/2019 2:45 PM Medical Record Number: [id number] Patient Account Number: [id number] Date of Birth/Sex: Treating RN: [birth year] (84 y.o. Curtis Jenkins Primary Care Aylinn Rydberg: [employer] Other Clinician: Referring Peggie Hornak: Treating Loyce Klasen/Extender: Garfield Cornea in Treatment: 0 Encounter Discharge Information Items Post Procedure Vitals Discharge Condition: Stable Temperature (F): 98.4 Ambulatory Status: Wheelchair Pulse (bpm): 67 Discharge Destination: Home Respiratory Rate (breaths/min): 19 Transportation: Other Blood Pressure (mmHg): 127/82 Accompanied By: daughter Schedule Follow-up Appointment: Yes Clinical Summary of Care: Patient Declined Electronic Signature(s) Signed: 06/23/2019 4:59:25 PM By: Kela Millin Entered By: Kela Millin on 06/23/2019 16:56:19 -------------------------------------------------------------------------------- Lower Extremity Assessment Details Patient Name: Date of Service: MA Curtis Jenkins NA RD G. 06/23/2019 2:45 PM Medical Record Number: [id number] Patient Account Number: [id number] Date of Birth/Sex: Treating RN: 06-29-1935 (84 y.o. Curtis Jenkins Primary Care Jahid Weida: [employer] Other Clinician: Referring Lujain Kraszewski: Treating Tarren Sabree/Extender: Garfield Cornea in Treatment: 0 Edema Assessment Assessed: [name]: No] Patrice Paradise: No] E[Left: dema] [Right: :] Calf Left: Right: Point of Measurement: 39 cm From Medial Instep 40 cm cm Ankle Left: Right: Point of Measurement: 14 cm From Medial Instep 27 cm cm Vascular Assessment Pulses: Dorsalis Pedis Palpable: [Left:No] Blood Pressure: Brachial: [Left:127] Ankle: [Left:Dorsalis Pedis: 98 0.77] Electronic Signature(s) Signed: 06/23/2019 4:59:25 PM By: Kela Millin Entered By: Kela Millin on 06/23/2019 15:37:59 -------------------------------------------------------------------------------- Multi Wound Chart Details Patient Name: Date of Service: MA Curtis Jenkins NA RD G. 06/23/2019 2:45 PM Medical Record Number: [id number] Patient Account Number: [id number] Date of Birth/Sex: Treating RN: 26-Nov-1935 (84 y.o. Curtis Jenkins Primary Care Antoinett Dorman: [employer] Other Clinician: Referring Adom Schoeneck: Treating Dyron Kawano/Extender: Garfield Cornea in Treatment: 0 Vital Signs Height(in): 71 Pulse(bpm): 75 Weight(lbs): 250 Blood Pressure(mmHg): 127/82 Body Mass Index(BMI): 35 Temperature(F): 98.4 Respiratory Rate(breaths/min): 19 Photos: [1:No Photos Left, Lateral Calcaneus] [N/A:N/A N/A] Wound Location: [1:Blister] [N/A:N/A] Wounding Event: [1:Pressure Ulcer] [N/A:N/A] Primary Etiology: [1:05/12/2019] [N/A:N/A] Date Acquired: [1:0] [N/A:N/A] Weeks of Treatment: [1:Open] [N/A:N/A] Wound Status: [1:3.4x3.6x1.6] [N/A:N/A] Measurements L x W x D (cm) [1:9.613] [N/A:N/A] A (cm) : rea [1:15.381] [N/A:N/A] Volume (cm) : [1:Unstageable/Unclassified] [N/A:N/A] Classification: [1:Medium] [N/A:N/A] Exudate A mount: [1:Purulent] [N/A:N/A] Exudate Type: [1:yellow, brown, green] [N/A:N/A] Exudate Color: [1:Yes] [N/A:N/A] Foul Odor A Cleansing: [1:fter No] [N/A:N/A] Odor A nticipated Due to Product Use: [1:Well defined, not attached]  [N/A:N/A] Wound Margin: [1:Small (1-33%)] [N/A:N/A] Granulation A mount: [1:Pink] [N/A:N/A] Granulation Quality: [1:Large (67-100%)] [N/A:N/A] Necrotic A mount: [1:Eschar, Adherent Slough] [N/A:N/A] Necrotic Tissue: [1:Fat Layer (Subcutaneous Tissue)] [N/A:N/A] Exposed Structures: [1:Exposed: Yes None] [N/A:N/A] Epithelialization: [1:Debridement - Excisional] [N/A:N/A] Debridement: Pre-procedure Verification/Time Out 15:55 [N/A:N/A] Taken: [1:Lidocaine 4% T opical Solution] [N/A:N/A] Pain Control: [1:Necrotic/Eschar, Muscle,] [N/A:N/A] Tissue Debrided: [1:Subcutaneous, Slough Skin/Subcutaneous Tissue/Muscle] [N/A:N/A] Level: [1:12.24] [N/A:N/A] Debridement A (sq cm): [1:rea Blade, Curette] [N/A:N/A] Instrument: [1:Swab] [N/A:N/A] Specimen: [1:1] [N/A:N/A] Number of Specimens Taken: [1:Minimum] [N/A:N/A] Bleeding: [1:Pressure] [N/A:N/A] Hemostasis Achieved: [1:0] [N/A:N/A] Procedural Pain: [1:0] [N/A:N/A] Post Procedural Pain: Debridement Treatment Response: Procedure was tolerated well [N/A:N/A] Post Debridement Measurements L x 3.4x3.6x1.6 [N/A:N/A] W x D (cm) [1:15.381] [N/A:N/A] Post Debridement Volume: (cm) [1:Unstageable/Unclassified] [N/A:N/A] Post Debridement Stage: [1:Debridement] [N/A:N/A] Treatment Notes Electronic Signature(s) Signed: 06/23/2019 5:25:48 PM By: Deon Pilling Signed: 06/27/2019 8:32:12 AM By: Linton Ham MD Entered By: Linton Ham on 06/23/2019 16:31:34 --------------------------------------------------------------------------------  Multi-Disciplinary Care Plan Details Patient Name: Date of Service: MA Curtis Jenkins [place] RD G. 06/23/2019 2:45 PM Medical Record Number: [id number] Patient Account Number: [id number] Date of Birth/Sex: Treating RN: [birth year] (84 y.o. Curtis Jenkins, [employer]eding Primary Care Genene Kilman: [employer] Other Clinician: Referring Goerge Mohr: Treating Oddie Bottger/Extender: Garfield Cornea in Treatment: 0 Active  Inactive Orientation to the Wound Care Program Nursing Diagnoses: Knowledge deficit related to the wound healing center program Goals: Patient/caregiver will verbalize understanding of the [place] Date Initiated: 06/23/2019 Target Resolution Date: 07/22/2019 Goal Status: Active Interventions: Provide education on orientation to the wound center Notes: Pain, Acute or Chronic Nursing Diagnoses: Pain, acute or chronic: actual or potential Potential alteration in comfort, pain Goals: Patient will verbalize adequate pain control and receive pain control interventions during procedures as needed Date Initiated: 06/23/2019 Target Resolution Date: 07/22/2019 Goal Status: Active Patient/caregiver will verbalize comfort level met Date Initiated: 06/23/2019 Target Resolution Date: 07/22/2019 Goal Status: Active Interventions: Provide education on pain management Reposition patient for comfort Treatment Activities: Administer pain control measures as ordered : 06/23/2019 Notes: Wound/Skin Impairment Nursing Diagnoses: Knowledge deficit related to ulceration/compromised skin integrity Goals: Patient/caregiver will verbalize understanding of skin care regimen Date Initiated: 06/23/2019 Target Resolution Date: 07/23/2019 Goal Status: Active Interventions: Assess patient/caregiver ability to obtain necessary supplies Assess patient/caregiver ability to perform ulcer/skin care regimen upon admission and as needed Provide education on ulcer and skin care Treatment Activities: Skin care regimen initiated : 06/23/2019 Topical wound management initiated : 06/23/2019 Notes: Electronic Signature(s) Signed: 06/23/2019 5:25:48 PM By: Deon Pilling Entered By: Deon Pilling on 06/23/2019 15:05:00 -------------------------------------------------------------------------------- Pain Assessment Details Patient Name: Date of Service: Curtis Jenkins NA RD G. 06/23/2019 2:45 PM Medical  Record Number: [id number] Patient Account Number: [id number] Date of Birth/Sex: Treating RN: 11-02-1935 (84 y.o. Curtis Jenkins Primary Care Shreyan Hinz: [employer] Other Clinician: Referring Layli Capshaw: Treating Eldo Umanzor/Extender: Garfield Cornea in Treatment: 0 Active Problems Location of Pain Severity and Description of Pain Patient Has Paino No Site Locations Pain Management and Medication Current Pain Management: Electronic Signature(s) Signed: 06/23/2019 4:59:25 PM By: Kela Millin Entered By: Kela Millin on 06/23/2019 15:39:54 -------------------------------------------------------------------------------- Patient/Caregiver Education Details Patient Name: Date of Service: MA Curtis Jenkins NA RD G. 6/10/2021andnbsp2:45 PM Medical Record Number: [id number] Patient Account Number: [id number] Date of Birth/Gender: Treating RN: June 17, 1935 (84 y.o. Curtis Jenkins Primary Care Physician: [employer] Other Clinician: Referring Physician: Treating Physician/Extender: Garfield Cornea in Treatment: 0 Education Assessment Education Provided To: Patient Education Topics Provided Pain: Handouts: A Guide to Pain Control Methods: Explain/Verbal, Printed Responses: Reinforcements needed [place]: o Handouts: Welcome T [facility] o Methods: Explain/Verbal, Printed Responses: Reinforcements needed Electronic Signature(s) Signed: 06/23/2019 5:25:48 PM By: Deon Pilling Entered By: Deon Pilling on 06/23/2019 15:05:13 -------------------------------------------------------------------------------- Wound Assessment Details Patient Name: Date of Service: MA Curtis Jenkins NA RD G. 06/23/2019 2:45 PM Medical Record Number: [id number] Patient Account Number: [id number] Date of Birth/Sex: Treating RN: [birth year] (84 y.o. Curtis Jenkins Primary Care Levell Tavano: [employer] Other  Clinician: Referring Gaynel Schaafsma: Treating Dagmar Adcox/Extender: Garfield Cornea in Treatment: 0 Wound Status Wound Number: 1 Primary Etiology: Pressure Ulcer Wound Location: Left, Lateral Calcaneus Wound Status: Open Wounding Event: Blister Date Acquired: 05/12/2019 Weeks Of Treatment: 0 Clustered Wound: No Wound Measurements Length: (cm) 3.4 Width: (cm) 3.6 Depth: (cm) 1.6 Area: (cm) 9.613 Volume: (cm) 15.381 % Reduction in Area: % Reduction in Volume: Epithelialization:  None Tunneling: No Undermining: No Wound Description Classification: Unstageable/Unclassified Wound Margin: Well defined, not attached Exudate Amount: Medium Exudate Type: Purulent Exudate Color: yellow, brown, green Foul Odor After Cleansing: Yes Due to Product Use: No Slough/Fibrino Yes Wound Bed Granulation Amount: Small (1-33%) Exposed Structure Granulation Quality: Pink Fat Layer (Subcutaneous Tissue) Exposed: Yes Necrotic Amount: Large (67-100%) Necrotic Quality: Eschar, Adherent Slough Treatment Notes Wound #1 (Left, Lateral Calcaneus) 1. Cleanse With Wound Cleanser 3. Primary Dressing Applied Calcium Alginate Ag 4. Secondary Dressing Dry Gauze Roll Gauze Heel Cup 5. Secured With Tape Notes Horticulturist, commercial) Signed: 06/23/2019 4:59:25 PM By: Kela Millin Entered By: Kela Millin on 06/23/2019 15:39:43 -------------------------------------------------------------------------------- Vitals Details Patient Name: Date of Service: MA Curtis Jenkins NA RD G. 06/23/2019 2:45 PM Medical Record Number: [id number] Patient Account Number: [id number] Date of Birth/Sex: Treating RN: April 21, 1935 (84 y.o. Curtis Jenkins Primary Care Rohen Kimes: [employer] Other Clinician: Referring Nkenge Sonntag: Treating Saliah Crisp/Extender: Garfield Cornea in Treatment: 0 Vital Signs Time Taken: 15:00 Temperature (F): 98.4 Height (in):  71 Pulse (bpm): 67 Source: Stated Respiratory Rate (breaths/min): 19 Weight (lbs): 250 Blood Pressure (mmHg): 127/82 Source: Stated Reference Range: 80 - 120 mg / dl Body Mass Index (BMI): 34.9 Electronic Signature(s) Signed: 06/23/2019 4:59:25 PM By: Kela Millin Entered By: Kela Millin on 06/23/2019 15:10:35

## 2019-06-28 ENCOUNTER — Encounter (HOSPITAL_BASED_OUTPATIENT_CLINIC_OR_DEPARTMENT_OTHER): Payer: Medicare Other | Admitting: Internal Medicine

## 2019-06-28 DIAGNOSIS — L89620 Pressure ulcer of left heel, unstageable: Secondary | ICD-10-CM | POA: Diagnosis not present

## 2019-06-28 DIAGNOSIS — I4891 Unspecified atrial fibrillation: Secondary | ICD-10-CM | POA: Diagnosis not present

## 2019-06-28 DIAGNOSIS — I70244 Atherosclerosis of native arteries of left leg with ulceration of heel and midfoot: Secondary | ICD-10-CM | POA: Diagnosis not present

## 2019-06-28 DIAGNOSIS — I10 Essential (primary) hypertension: Secondary | ICD-10-CM | POA: Diagnosis not present

## 2019-06-28 DIAGNOSIS — L03116 Cellulitis of left lower limb: Secondary | ICD-10-CM | POA: Diagnosis not present

## 2019-06-28 DIAGNOSIS — B957 Other staphylococcus as the cause of diseases classified elsewhere: Secondary | ICD-10-CM | POA: Diagnosis not present

## 2019-06-28 DIAGNOSIS — Z923 Personal history of irradiation: Secondary | ICD-10-CM | POA: Diagnosis not present

## 2019-06-28 DIAGNOSIS — L89321 Pressure ulcer of left buttock, stage 1: Secondary | ICD-10-CM | POA: Diagnosis not present

## 2019-06-28 DIAGNOSIS — G603 Idiopathic progressive neuropathy: Secondary | ICD-10-CM | POA: Diagnosis not present

## 2019-06-28 DIAGNOSIS — B952 Enterococcus as the cause of diseases classified elsewhere: Secondary | ICD-10-CM | POA: Diagnosis not present

## 2019-06-28 DIAGNOSIS — I70202 Unspecified atherosclerosis of native arteries of extremities, left leg: Secondary | ICD-10-CM | POA: Diagnosis not present

## 2019-06-28 DIAGNOSIS — Z8673 Personal history of transient ischemic attack (TIA), and cerebral infarction without residual deficits: Secondary | ICD-10-CM | POA: Diagnosis not present

## 2019-06-28 DIAGNOSIS — Z7901 Long term (current) use of anticoagulants: Secondary | ICD-10-CM | POA: Diagnosis not present

## 2019-06-29 ENCOUNTER — Other Ambulatory Visit: Payer: Self-pay | Admitting: Internal Medicine

## 2019-06-29 ENCOUNTER — Other Ambulatory Visit (HOSPITAL_COMMUNITY): Payer: Self-pay | Admitting: Internal Medicine

## 2019-06-29 DIAGNOSIS — L89620 Pressure ulcer of left heel, unstageable: Secondary | ICD-10-CM

## 2019-06-29 NOTE — Progress Notes (Signed)
Curtis Jenkins, Curtis Jenkins ([id number]) Visit Report for 06/28/2019 Arrival Information Details Patient Name: Date of Service: MA Curtis Jenkins [place] RD G. 06/28/2019 12:45 PM Medical Record Number: [id number] Patient Account Number: [id number] Date of Birth/Sex: Treating RN: [birth year] (84 y.o. [name], Meta.Reding Primary Care Emmajo Bennette: [employer] Other Clinician: Referring Tylesha Gibeault: Treating Judianne Seiple/Extender: Garfield Cornea in Treatment: 0 Visit Information History Since Last Visit All ordered tests and consults were completed: Yes Patient Arrived: Wheel Chair Added or deleted any medications: No Arrival Time: 12:35 Any new allergies or adverse reactions: No Accompanied By: daughter Had a fall or experienced change in No Transfer Assistance: Manual activities of daily living that may affect Patient Identification Verified: Yes risk of falls: Secondary Verification Process Completed: Yes Signs or symptoms of abuse/neglect since last visito No Patient Requires Transmission-Based Precautions: No Hospitalized since last visit: No Patient Has Alerts: Yes Implantable device outside of the clinic excluding No Patient Alerts: Left ABI: 0.77 cellular tissue based products placed in the center since last visit: Has Dressing in Place as Prescribed: Yes Pain Present Now: No Notes arterial studies scheduled for Tuesday 07/05/2019. Per daughter x-ray done yesterday at [place] Signature(s) Signed: 06/28/2019 5:41:07 PM By: Deon Pilling Entered By: Deon Pilling on 06/28/2019 12:51:05 -------------------------------------------------------------------------------- Encounter Discharge Information Details Patient Name: Date of Service: MA Curtis Jenkins NA RD G. 06/28/2019 12:45 PM Medical Record Number: [id number] Patient Account Number: [id number] Date of Birth/Sex: Treating RN: [birth year] (84 y.o. [name] Primary Care Jamicah Anstead: [employer] Other  Clinician: Referring Nevae Pinnix: Treating [place]/Extender: Garfield Cornea in Treatment: 0 Encounter Discharge Information Items Post Procedure Vitals Discharge Condition: Stable Temperature (F): 97.8 Ambulatory Status: Walker Pulse (bpm): 69 Discharge Destination: Home Respiratory Rate (breaths/min): 18 Transportation: Other Blood Pressure (mmHg): 105/62 Accompanied By: daughter Schedule Follow-up Appointment: Yes Clinical Summary of Care: Patient Declined Electronic Signature(s) Signed: 06/28/2019 5:39:21 PM By: Kela Millin Entered By: Kela Millin on 06/28/2019 13:56:33 -------------------------------------------------------------------------------- Lower Extremity Assessment Details Patient Name: Date of Service: Curtis Jenkins NA RD G. 06/28/2019 12:45 PM Medical Record Number: [id number] Patient Account Number: [id number] Date of Birth/Sex: Treating RN: 08/10/1935 (84 y.o. [name] Primary Care Sherald Balbuena: [employer] Other Clinician: Referring Daelyn Mozer: Treating Elbia Paro/Extender: Garfield Cornea in Treatment: 0 Edema Assessment Assessed: [name]: Yes] Patrice Paradise: No] Edema: [Left: Ye] [Right: s] Calf Left: Right: Point of Measurement: 39 cm From Medial Instep 35.5 cm cm Ankle Left: Right: Point of Measurement: 14 cm From Medial Instep 25 cm cm Vascular Assessment Pulses: Dorsalis Pedis Palpable: [Left:Yes] Electronic Signature(s) Signed: 06/28/2019 5:41:07 PM By: Deon Pilling Entered By: Deon Pilling on 06/28/2019 12:52:04 -------------------------------------------------------------------------------- Multi Wound Chart Details Patient Name: Date of Service: MA Curtis Jenkins NA RD G. 06/28/2019 12:45 PM Medical Record Number: [id number] Patient Account Number: [id number] Date of Birth/Sex: Treating RN: Oct 10, 1935 (84 y.o. Oval Linsey Primary Care Orvell Careaga: [employer] Other Clinician: Referring  Brooke Steinhilber: Treating Gabrielle [place]/Extender: Garfield Cornea in Treatment: 0 Vital Signs Height(in): 71 Pulse(bpm): 53 Weight(lbs): 250 Blood Pressure(mmHg): 105/62 Body Mass Index(BMI): 35 Temperature(F): 97.8 Respiratory Rate(breaths/min): 18 Photos: [1:No Photos Left, Lateral Calcaneus] [N/A:N/A N/A] Wound Location: [1:Blister] [N/A:N/A] Wounding Event: [1:Pressure Ulcer] [N/A:N/A] Primary Etiology: [1:05/12/2019] [N/A:N/A] Date Acquired: [1:0] [N/A:N/A] Weeks of Treatment: [1:Open] [N/A:N/A] Wound Status: [1:3.2x3.7x1.5] [N/A:N/A] Measurements L x W x D (cm) [1:9.299] [N/A:N/A] A (cm) : rea [1:13.949] [N/A:N/A] Volume (cm) : [1:3.30%] [N/A:N/A] % Reduction in A rea: [1:9.30%] [N/A:N/A] % Reduction  in Volume: [1:10] Starting Position 1 (o'clock): [1:11] Ending Position 1 (o'clock): [1:1.2] Maximum Distance 1 (cm): [1:Yes] [N/A:N/A] Undermining: [1:Unstageable/Unclassified] [N/A:N/A] Classification: [1:Medium] [N/A:N/A] Exudate A mount: [1:Serosanguineous] [N/A:N/A] Exudate Type: [1:red, brown] [N/A:N/A] Exudate Color: [1:Well defined, not attached] [N/A:N/A] Wound Margin: [1:Small (1-33%)] [N/A:N/A] Granulation A mount: [1:Pink] [N/A:N/A] Granulation Quality: [1:Large (67-100%)] [N/A:N/A] Necrotic A mount: [1:Eschar, Adherent Slough] [N/A:N/A] Necrotic Tissue: [1:Fat Layer (Subcutaneous Tissue)] [N/A:N/A] Exposed Structures: [1:Exposed: Yes Fascia: No Tendon: No Muscle: No Joint: No Bone: No None] [N/A:N/A] Epithelialization: [1:Debridement - Excisional] [N/A:N/A] Debridement: Pre-procedure Verification/Time Out 13:15 [N/A:N/A] Taken: [1:Lidocaine 4% Topical Solution] [N/A:N/A] Pain Control: [1:Subcutaneous, Slough] [N/A:N/A] Tissue Debrided: [1:Skin/Subcutaneous Tissue] [N/A:N/A] Level: [1:11.84] [N/A:N/A] Debridement A (sq cm): [1:rea Curette] [N/A:N/A] Instrument: [1:Moderate] [N/A:N/A] Bleeding: [1:Pressure] [N/A:N/A] Hemostasis A chieved:  [1:0] [N/A:N/A] Procedural Pain: [1:0] [N/A:N/A] Post Procedural Pain: [1:Procedure was tolerated well] [N/A:N/A] Debridement Treatment Response: [1:3.2x3.7x1.5] [N/A:N/A] Post Debridement Measurements L x W x D (cm) [1:13.949] [N/A:N/A] Post Debridement Volume: (cm) [1:Debridement] [N/A:N/A] Treatment Notes Electronic Signature(s) Signed: 06/28/2019 5:55:30 PM By: Linton Ham MD Signed: 06/29/2019 9:54:21 AM By: Carlene Coria RN Entered By: Linton Ham on 06/28/2019 13:22:27 -------------------------------------------------------------------------------- [place] Details Patient Name: Date of Service: MA Curtis Jenkins NA RD G. 06/28/2019 12:45 PM Medical Record Number: [id number] Patient Account Number: [id number] Date of Birth/Sex: Treating RN: 02/21/1935 (84 y.o. Oval Linsey Primary Care Lou Irigoyen: [employer] Other Clinician: Referring Lenville Hibberd: Treating Permelia Bamba/Extender: Garfield Cornea in Treatment: 0 Active Inactive Orientation to the Wound Care Program Nursing Diagnoses: Knowledge deficit related to the wound healing center program Goals: Patient/caregiver will verbalize understanding of the [place] Date Initiated: 06/23/2019 Target Resolution Date: 07/22/2019 Goal Status: Active Interventions: Provide education on orientation to the wound center Notes: Pain, Acute or Chronic Nursing Diagnoses: Pain, acute or chronic: actual or potential Potential alteration in comfort, pain Goals: Patient will verbalize adequate pain control and receive pain control interventions during procedures as needed Date Initiated: 06/23/2019 Target Resolution Date: 07/22/2019 Goal Status: Active Patient/caregiver will verbalize comfort level met Date Initiated: 06/23/2019 Target Resolution Date: 07/22/2019 Goal Status: Active Interventions: Provide education on pain management Reposition patient for comfort Treatment  Activities: Administer pain control measures as ordered : 06/23/2019 Notes: Wound/Skin Impairment Nursing Diagnoses: Knowledge deficit related to ulceration/compromised skin integrity Goals: Patient/caregiver will verbalize understanding of skin care regimen Date Initiated: 06/23/2019 Target Resolution Date: 07/23/2019 Goal Status: Active Interventions: Assess patient/caregiver ability to obtain necessary supplies Assess patient/caregiver ability to perform ulcer/skin care regimen upon admission and as needed Provide education on ulcer and skin care Treatment Activities: Skin care regimen initiated : 06/23/2019 Topical wound management initiated : 06/23/2019 Notes: Electronic Signature(s) Signed: 06/29/2019 9:54:21 AM By: Carlene Coria RN Entered By: Carlene Coria on 06/28/2019 13:07:05 -------------------------------------------------------------------------------- Pain Assessment Details Patient Name: Date of Service: Curtis Jenkins NA RD G. 06/28/2019 12:45 PM Medical Record Number: [id number] Patient Account Number: [id number] Date of Birth/Sex: Treating RN: January 18, 1935 (84 y.o. [name], Meta.Reding Primary Care Tuwanda Vokes: [employer] Other Clinician: Referring Lavon Horn: Treating Anah Billard/Extender: Garfield Cornea in Treatment: 0 Active Problems Location of Pain Severity and Description of Pain Patient Has Paino No Site Locations Pain Management and Medication Current Pain Management: Medication: No Cold Application: No Rest: No Massage: No Activity: No T.E.N.S.: No Heat Application: No Leg drop or elevation: No Is the Current Pain Management Adequate: Adequate How does your wound impact your activities of daily livingo Sleep: No Bathing: No Appetite: No Relationship  With Others: No Bladder Continence: No Emotions: No Bowel Continence: No Work: No Toileting: No Drive: No Dressing: No Hobbies: No Electronic Signature(s) Signed: 06/28/2019  5:41:07 PM By: Deon Pilling Entered By: Deon Pilling on 06/28/2019 12:51:28 -------------------------------------------------------------------------------- Patient/Caregiver Education Details Patient Name: Date of Service: MA Curtis Jenkins NA RD G. 6/15/2021andnbsp12:45 PM Medical Record Number: [id number] Patient Account Number: [id number] Date of Birth/Gender: Treating RN: 1935-04-20 (84 y.o. Oval Linsey Primary Care Physician: [employer] Other Clinician: Referring Physician: Treating Physician/Extender: Garfield Cornea in Treatment: 0 Education Assessment Education Provided To: Patient Education Topics Provided Welcome T [facility] [place]: o Methods: Explain/Verbal Responses: State content correctly Wound/Skin Impairment: Methods: Explain/Verbal Responses: State content correctly Electronic Signature(s) Signed: 06/29/2019 9:54:21 AM By: Carlene Coria RN Signed: 06/29/2019 9:54:21 AM By: Carlene Coria RN Entered By: Carlene Coria on 06/28/2019 13:07:30 -------------------------------------------------------------------------------- Wound Assessment Details Patient Name: Date of Service: Curtis Jenkins NA RD G. 06/28/2019 12:45 PM Medical Record Number: [id number] Patient Account Number: [id number] Date of Birth/Sex: Treating RN: 08-Sep-1935 (84 y.o. [name], Meta.Reding Primary Care Malayla Granberry: [employer] Other Clinician: Referring Akasia Ahmad: Treating Anslie Spadafora/Extender: Garfield Cornea in Treatment: 0 Wound Status Wound Number: 1 Primary Etiology: Pressure Ulcer Wound Location: Left, Lateral Calcaneus Wound Status: Open Wounding Event: Blister Date Acquired: 05/12/2019 Weeks Of Treatment: 0 Clustered Wound: No Wound Measurements Length: (cm) 3.2 Width: (cm) 3.7 Depth: (cm) 1.5 Area: (cm) 9.299 Volume: (cm) 13.949 % Reduction in Area: 3.3% % Reduction in Volume: 9.3% Epithelialization: None Tunneling:  No Undermining: Yes Starting Position (o'clock): 10 Ending Position (o'clock): 11 Maximum Distance: (cm) 1.2 Wound Description Classification: Unstageable/Unclassified Wound Margin: Well defined, not attached Exudate Amount: Medium Exudate Type: Serosanguineous Exudate Color: red, brown Foul Odor After Cleansing: No Slough/Fibrino Yes Wound Bed Granulation Amount: Small (1-33%) Exposed Structure Granulation Quality: Pink Fascia Exposed: No Necrotic Amount: Large (67-100%) Fat Layer (Subcutaneous Tissue) Exposed: Yes Necrotic Quality: Eschar, Adherent Slough Tendon Exposed: No Muscle Exposed: No Joint Exposed: No Bone Exposed: No Treatment Notes Wound #1 (Left, Lateral Calcaneus) 1. Cleanse With Wound Cleanser 3. Primary Dressing Applied Calcium Alginate Ag 4. Secondary Dressing Dry Gauze Roll Gauze Heel Cup 5. Secured With Tape Notes Horticulturist, commercial) Signed: 06/28/2019 5:41:07 PM By: Deon Pilling Entered By: Deon Pilling on 06/28/2019 12:53:13 -------------------------------------------------------------------------------- Vitals Details Patient Name: Date of Service: MA Curtis Jenkins NA RD G. 06/28/2019 12:45 PM Medical Record Number: [id number] Patient Account Number: [id number] Date of Birth/Sex: Treating RN: Jun 17, 1935 (84 y.o. [name], Meta.Reding Primary Care Arvell Pulsifer: [employer] Other Clinician: Referring Jarreau Callanan: Treating Ethelmae Ringel/Extender: Garfield Cornea in Treatment: 0 Vital Signs Time Taken: 12:42 Temperature (F): 97.8 Height (in): 71 Pulse (bpm): 69 Weight (lbs): 250 Respiratory Rate (breaths/min): 18 Body Mass Index (BMI): 34.9 Blood Pressure (mmHg): 105/62 Reference Range: 80 - 120 mg / dl Electronic Signature(s) Signed: 06/28/2019 5:41:07 PM By: Deon Pilling Entered By: Deon Pilling on 06/28/2019 12:51:19

## 2019-06-29 NOTE — Progress Notes (Signed)
ROSHAN, ROBACK ([id number]) Visit Report for 06/28/2019 Debridement Details Patient Name: Date of Service: [name] 06/28/2019 12:45 PM Medical Record Number: [id number] Patient Account Number: [id number] Date of Birth/Sex: Treating RN: Aug 12, 1935 (84 y.o. Jerilynn Mages) Carlene Coria Primary Care Provider: [employer] Other Clinician: Referring Provider: Treating Provider/Extender: Garfield Cornea in Treatment: 0 Debridement Performed for Assessment: Wound #1 Left,Lateral Calcaneus Performed By: Physician Ricard Dillon., MD Debridement Type: Debridement Level of Consciousness (Pre-procedure): Awake and Alert Pre-procedure Verification/Time Out Yes - 13:15 Taken: Start Time: 13:15 Pain Control: Lidocaine 4% T opical Solution T Area Debrided (L x W): otal 3.2 (cm) x 3.7 (cm) = 11.84 (cm) Tissue and other material debrided: Viable, Non-Viable, Muscle, Slough, Subcutaneous, Skin: Dermis , Skin: Epidermis, Slough Level: Skin/Subcutaneous Tissue/Muscle Debridement Description: Excisional Instrument: Curette Bleeding: Moderate Hemostasis Achieved: Pressure End Time: 15:18 Procedural Pain: 0 Post Procedural Pain: 0 Response to Treatment: Procedure was tolerated well Level of Consciousness (Post- Awake and Alert procedure): Post Debridement Measurements of Total Wound Length: (cm) 3.2 Width: (cm) 3.7 Depth: (cm) 1.5 Volume: (cm) 13.949 Character of Wound/Ulcer Post Debridement: Improved Post Procedure Diagnosis Same as Pre-procedure Electronic Signature(s) Signed: 06/28/2019 5:55:30 PM By: Linton Ham MD Signed: 06/29/2019 9:54:21 AM By: Carlene Coria RN Entered By: Linton Ham on 06/28/2019 13:29:35 -------------------------------------------------------------------------------- HPI Details Patient Name: Date of Service: [name] NA RD G. 06/28/2019 12:45 PM Medical Record Number: [id number] Patient Account Number: [id number] Date of  Birth/Sex: Treating RN: 10-25-1935 (84 y.o. Oval Linsey Primary Care Provider: [employer] Other Clinician: Referring Provider: Treating Provider/Extender: Garfield Cornea in Treatment: 0 History of Present Illness HPI Description: ADMISSION 06/23/19 This is an 84 year old man who lives in [place]. He was admitted to hospital in March with altered functional level. 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 He came home with physical therapy at some point he was discovered that he had pressure ulcer on the left heel which started as a blister. He has [facility] going out to change dressings. They have been using Medihoney. The patient is minimally ambulatory only for transfers. They have been offloading the heel by using pillows etc. He spends most of his day in bed or in a recliner. 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 His daughter lives next door and there are other family members close by. ABI in our clinic was 0.77 on the left 06/28/2019; patient I admitted to the clinic last week who has a deep pressure ulcer on the left heel. Culture I did of this area showed Enterococcus and staph epidermidis. Empirically put him on Augmentin last week which will continue for another week. His x-ray suggested possible calcaneal erosion. He will also require an MRI. We are using silver alginate. Arterial studies are on 6/22 Electronic Signature(s) Signed: 06/28/2019 5:55:30 PM By: Linton Ham MD Entered By: Linton Ham on 06/28/2019 13:24:36 -------------------------------------------------------------------------------- Physical Exam Details Patient Name: Date of Service: [name] NA RD G. 06/28/2019 12:45 PM Medical Record Number: [id number] Patient Account Number: [id number] Date of Birth/Sex: Treating RN: 31-May-1935 (84 y.o. Oval Linsey Primary Care Provider: [employer] Other Clinician: Referring Provider: Treating Provider/Extender: Garfield Cornea in Treatment: 0 Cardiovascular Pedal pulses are  not palpable. Integumentary (Hair, Skin) Degree of erythema around the wound is considerably better. Notes Wound exam; lateral calcaneus. T ender percent covered in necrotic debris. I used a #5 curette for debridement. This included necrotic fat and muscle. I am still not able to get down to a completely healthy surface. Undermining from roughly 2-4 o'clock at 2 cm. The erythema around the wound is considerably better. This was spreading into the midfoot last time suggesting the Augmentin is effective Electronic Signature(s) Signed: 06/28/2019 5:55:30 PM By: Linton Ham MD Entered By: Linton Ham on 06/28/2019 13:25:52 -------------------------------------------------------------------------------- Physician Orders Details Patient Name: Date of Service: [name] NA RD G. 06/28/2019 12:45 PM Medical Record Number: [id number] Patient Account Number: [id number] Date of Birth/Sex: Treating RN: [birth year] (84 y.o. 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 - home health change twice a week. wound center  weekly. all other days family to change. Wound Cleansing Clean wound with Wound Cleanser - or wash with soap and water. Primary Wound Dressing Wound #1 Left,Lateral Calcaneus Calcium Alginate with Silver Secondary Dressing Kerlix/Rolled Gauze Dry Gauze Heel Cup Edema Control Elevate legs to the level of the heart or above for 30 minutes daily and/or when sitting, a frequency of: - throughout the day. Off-Loading Other: - float heels with bunny boots or pillows while rest in bed or chair to aid in offloading pressure to heels. Additional Orders / Instructions Follow Nutritious Diet - encourage patient to eat. Try to increase protein and vegetables to aid in wound heailng. [facility] skilled [facility] for wound care. - [facility] twice a week. wound center weekly. Radiology Magnetic Resonance Imaging (MRI) , without contrast left foot - without contrast , left foot, non healing wound xray can not rule out ostemomylitis - (ICD10 L89.620 - Pressure ulcer of left heel, unstageable) Patient Medications llergies: No Known Allergies A Notifications Medication Indication Start End wound infection 06/28/2019 Augmentin DOSE oral 875 mg-125 mg tablet - 1 tablet oral bid for a further 7 days Electronic Signature(s) Signed: 06/28/2019 1:28:50 PM By: Linton Ham MD Entered By: Linton Ham on 06/28/2019 13:28:49 Prescription 06/28/2019 -------------------------------------------------------------------------------- Marcelino Scot Linton Ham MD Patient Name: Provider: 1935-07-09 [id number] Date of Birth: NPI#: Jerilynn Mages [id number] Sex: DEA #: [phone] [id number] Phone #: License #: [place] Patient Address: [address] [place], [place] [address] [place], [place] [address] [phone] Allergies Name Reaction Severity No Known Allergies Provider's Orders Magnetic Resonance  Imaging (MRI) , without contrast left foot - ICD10: L89.620 - without contrast , left foot, non healing wound xray can not rule out ostemomylitis Hand Signature: Date(s): Electronic Signature(s) Signed: 06/28/2019 5:55:30 PM By: Linton Ham MD Entered By: Linton Ham on 06/28/2019 13:28:51 -------------------------------------------------------------------------------- Problem List Details Patient Name: Date of Service: [name] NA RD G. 06/28/2019 12:45 PM Medical Record Number: [id number] Patient Account Number: [id number] Date of Birth/Sex: Treating RN: Feb 11, 1935 (84 y.o. 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 06/28/2019 12:45 PM Medical Record Number: [id number] Patient Account Number: [id number] Date of Birth/Sex: Treating RN: March 16, 1935 (84 y.o. Oval Linsey Primary Care Provider: [employer] Other Clinician: Referring Provider: Treating Provider/Extender: Garfield Cornea in Treatment: 0 Subjective History of Present Illness (HPI) ADMISSION 06/23/19 This is an 84 year old man who lives in [place]. He was admitted  to hospital in March with altered functional level. 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 He came home with physical therapy at some point he was discovered that he had pressure ulcer on the left heel which started as a blister. He has had [facility] going out to change dressings. They have been using Medihoney. The patient is minimally ambulatory only for transfers. They have been offloading the heel by using pillows etc. He spends most of his day in bed or in a recliner. 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 His daughter lives next door and there are other family members close by. ABI in our clinic was 0.77 on the left 06/28/2019; patient I admitted to the clinic last week who has a deep pressure ulcer on the left heel. Culture I did of this area showed Enterococcus and staph epidermidis. Empirically put him on Augmentin last week which will continue for another week. His x-ray suggested possible calcaneal erosion. He will also require an MRI. We are using silver alginate. Arterial studies are on 6/22 Objective Constitutional Vitals Time Taken: 12:42 PM, Height: 71 in, Weight: 250 lbs, BMI: 34.9, Temperature: 97.8 F, Pulse: 69 bpm, Respiratory Rate: 18 breaths/min, Blood Pressure: 105/62 mmHg. Cardiovascular Pedal pulses are not palpable. General Notes: Wound exam; lateral calcaneus. T ender percent covered in necrotic debris. I used a #5 curette for debridement. This included necrotic fat and muscle. I am still not able to get down to a completely healthy surface. Undermining from roughly 2-4 o'clock at 2 cm. The erythema around the wound is considerably better. This was spreading into the midfoot last time suggesting  the Augmentin is effective Integumentary (Hair, Skin) Degree of erythema around the wound is considerably better. Wound #1 status is Open. Original cause of wound was Blister. The wound is located on the Left,Lateral Calcaneus. The wound measures 3.2cm length x 3.7cm width x 1.5cm depth; 9.299cm^2 area and 13.949cm^3 volume. There is Fat Layer (Subcutaneous Tissue) Exposed exposed. There is no tunneling noted, however, there is undermining starting at 10:00 and ending at 11:00 with a maximum distance of 1.2cm. There is a medium amount of serosanguineous drainage noted. The wound margin is well defined and not attached to the wound base. There is small (1-33%) pink granulation within the wound bed. There is a large (67-100%) amount of necrotic tissue within the wound bed including  Eschar and Adherent Slough. 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 There was a Excisional Skin/Subcutaneous Tissue/Muscle Debridement with a total area of 11.84 sq cm performed by Ricard Dillon., MD. With the following instrument(s): Curette to remove Viable and Non-Viable tissue/material. Material removed includes Muscle, Subcutaneous Tissue, Slough, Skin: Dermis, and Skin: Epidermis after achieving pain control using Lidocaine 4% Topical Solution. No specimens were taken. A time out was conducted at 13:15, prior to the start of the procedure. A Moderate amount of bleeding was controlled with Pressure. The procedure was tolerated well with a pain level of 0 throughout and a pain level of 0 following the procedure. Post Debridement Measurements: 3.2cm length x 3.7cm width x 1.5cm depth; 13.949cm^3 volume. Character of  Wound/Ulcer Post Debridement is improved. Post procedure Diagnosis Wound #1: Same as Pre-Procedure Plan Follow-up Appointments: Return Appointment in: Dressing Change Frequency: Change dressing every day. - home health change twice a week. wound center weekly. all other days family to change. Wound Cleansing: Clean wound with Wound Cleanser - or wash with soap and water. Primary Wound Dressing: Wound #1 Left,Lateral Calcaneus: Calcium Alginate with Silver Secondary Dressing: Kerlix/Rolled Gauze Dry Gauze Heel Cup Edema Control: Elevate legs to the level of the heart or above for 30 minutes daily and/or when sitting, a frequency of: - throughout the day. Off-Loading: Other: - float heels with bunny boots or pillows while rest in bed or chair to aid in offloading pressure to heels. Additional Orders / Instructions: Follow Nutritious Diet - encourage patient to eat. Try to increase protein and vegetables to aid in wound heailng. Home Health: [facility] for wound care. - [facility] twice a week. wound center weekly. Radiology ordered were: Magnetic Resonance Imaging (MRI) , without contrast left foot - without contrast , left foot, non healing wound xray can not rule out ostemomylitis The following medication(s) was prescribed: Augmentin oral 875 mg-125 mg tablet 1 tablet oral bid for a further 7 days for wound infection starting 06/28/2019 1. I am continuing with silver alginate 2. MRI of the heel without contrast as the patient has stage III chronic kidney disease 3. Arterial studies for 6/22. 4. I am extending his Augmentin for further week. The soft tissue infection around the wound extending into the midfoot is considerably better than when I last saw this Electronic Signature(s) Signed: 06/28/2019 1:29:59 PM By: Linton Ham MD Previous Signature: 06/28/2019 1:29:09 PM Version By: Linton Ham MD Entered By: Linton Ham on 06/28/2019  13:29:59 -------------------------------------------------------------------------------- SuperBill Details Patient Name: Date of Service: [name] NA RD G. 06/28/2019 Medical Record Number: [id number] Patient Account Number: [id number] Date of Birth/Sex: Treating RN: 11-Dec-1935 (84 y.o. 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

## 2019-06-30 DIAGNOSIS — Z720 Tobacco use: Secondary | ICD-10-CM | POA: Diagnosis not present

## 2019-06-30 DIAGNOSIS — I69391 Dysphagia following cerebral infarction: Secondary | ICD-10-CM | POA: Diagnosis not present

## 2019-06-30 DIAGNOSIS — I69954 Hemiplegia and hemiparesis following unspecified cerebrovascular disease affecting left non-dominant side: Secondary | ICD-10-CM | POA: Diagnosis not present

## 2019-06-30 DIAGNOSIS — E785 Hyperlipidemia, unspecified: Secondary | ICD-10-CM | POA: Diagnosis not present

## 2019-06-30 DIAGNOSIS — N182 Chronic kidney disease, stage 2 (mild): Secondary | ICD-10-CM | POA: Diagnosis not present

## 2019-06-30 DIAGNOSIS — R1310 Dysphagia, unspecified: Secondary | ICD-10-CM | POA: Diagnosis not present

## 2019-06-30 DIAGNOSIS — M159 Polyosteoarthritis, unspecified: Secondary | ICD-10-CM | POA: Diagnosis not present

## 2019-06-30 DIAGNOSIS — M25422 Effusion, left elbow: Secondary | ICD-10-CM | POA: Diagnosis not present

## 2019-06-30 DIAGNOSIS — I129 Hypertensive chronic kidney disease with stage 1 through stage 4 chronic kidney disease, or unspecified chronic kidney disease: Secondary | ICD-10-CM | POA: Diagnosis not present

## 2019-06-30 DIAGNOSIS — L89620 Pressure ulcer of left heel, unstageable: Secondary | ICD-10-CM | POA: Diagnosis not present

## 2019-07-04 ENCOUNTER — Encounter (HOSPITAL_BASED_OUTPATIENT_CLINIC_OR_DEPARTMENT_OTHER): Payer: Medicare Other | Admitting: Internal Medicine

## 2019-07-04 DIAGNOSIS — L03116 Cellulitis of left lower limb: Secondary | ICD-10-CM | POA: Diagnosis not present

## 2019-07-04 DIAGNOSIS — L89620 Pressure ulcer of left heel, unstageable: Secondary | ICD-10-CM | POA: Diagnosis not present

## 2019-07-04 DIAGNOSIS — I70202 Unspecified atherosclerosis of native arteries of extremities, left leg: Secondary | ICD-10-CM | POA: Diagnosis not present

## 2019-07-04 DIAGNOSIS — Z7901 Long term (current) use of anticoagulants: Secondary | ICD-10-CM | POA: Diagnosis not present

## 2019-07-04 DIAGNOSIS — G603 Idiopathic progressive neuropathy: Secondary | ICD-10-CM | POA: Diagnosis not present

## 2019-07-04 DIAGNOSIS — Z923 Personal history of irradiation: Secondary | ICD-10-CM | POA: Diagnosis not present

## 2019-07-04 DIAGNOSIS — I10 Essential (primary) hypertension: Secondary | ICD-10-CM | POA: Diagnosis not present

## 2019-07-04 DIAGNOSIS — Z8673 Personal history of transient ischemic attack (TIA), and cerebral infarction without residual deficits: Secondary | ICD-10-CM | POA: Diagnosis not present

## 2019-07-04 DIAGNOSIS — B957 Other staphylococcus as the cause of diseases classified elsewhere: Secondary | ICD-10-CM | POA: Diagnosis not present

## 2019-07-04 DIAGNOSIS — I4891 Unspecified atrial fibrillation: Secondary | ICD-10-CM | POA: Diagnosis not present

## 2019-07-04 DIAGNOSIS — B952 Enterococcus as the cause of diseases classified elsewhere: Secondary | ICD-10-CM | POA: Diagnosis not present

## 2019-07-05 ENCOUNTER — Other Ambulatory Visit: Payer: Medicare Other

## 2019-07-05 NOTE — Progress Notes (Addendum)
Curtis Jenkins, Curtis Jenkins ([id number]) Visit Report for 07/04/2019 Arrival Information Details Patient Name: Date of Service: [place] RD G. 07/04/2019 2:30 PM Medical Record Number: [id number] Patient Account Number: [id number] Date of Birth/Sex: Treating RN: 16-Sep-1935 (84 y.o. Curtis Jenkins Primary Care Raymundo Rout: [employer] Other Clinician: Referring Shannyn Jankowiak: Treating Elmina Hendel/Extender: Garfield Cornea in Treatment: 1 Visit Information History Since Last Visit Added or deleted any medications: No Patient Arrived: Wheel Chair Any new allergies or adverse reactions: No Arrival Time: 15:11 Had a fall or experienced change in No Accompanied By: self activities of daily living that may affect Transfer Assistance: Manual risk of falls: Patient Identification Verified: Yes Signs or symptoms of abuse/neglect since last visito No Secondary Verification Process Completed: Yes Hospitalized since last visit: No Patient Requires Transmission-Based Precautions: No Implantable device outside of the clinic excluding No Patient Has Alerts: Yes cellular tissue based products placed in the center Patient Alerts: Left ABI: 0.77 since last visit: Has Dressing in Place as Prescribed: Yes Pain Present Now: No Electronic Signature(s) Signed: 07/04/2019 6:16:49 PM By: Kela Millin Entered By: Kela Millin on 07/04/2019 15:12:16 -------------------------------------------------------------------------------- Encounter Discharge Information Details Patient Name: Date of Service: [place] Curtis Jenkins NA RD G. 07/04/2019 2:30 PM Medical Record Number: [id number] Patient Account Number: [id number] Date of Birth/Sex: Treating RN: May 17, 1935 (84 y.o. Curtis Jenkins Primary Care Chayanne Speir: [employer] Other Clinician: Referring Dorthy Magnussen: Treating Lorielle Boehning/Extender: Garfield Cornea in Treatment: 1 Encounter Discharge Information Items Post  Procedure Vitals Discharge Condition: Stable Temperature (F): 98.5 Ambulatory Status: Wheelchair Pulse (bpm): 65 Discharge Destination: Home Respiratory Rate (breaths/min): 19 Transportation: Private Auto Blood Pressure (mmHg): 116/77 Accompanied By: daughter Schedule Follow-up Appointment: Yes Clinical Summary of Care: Electronic Signature(s) Signed: 07/04/2019 6:25:00 PM By: Deon Pilling Entered By: Deon Pilling on 07/04/2019 17:19:07 -------------------------------------------------------------------------------- Lower Extremity Assessment Details Patient Name: Date of Service: [place] Curtis Jenkins NA RD G. 07/04/2019 2:30 PM Medical Record Number: [id number] Patient Account Number: [id number] Date of Birth/Sex: Treating RN: May 18, 1935 (84 y.o. Curtis Jenkins Primary Care Claudius Mich: [employer] Other Clinician: Referring Emmabelle Fear: Treating Lianny Molter/Extender: Garfield Cornea in Treatment: 1 Edema Assessment Assessed: Curtis Jenkins: No] Curtis Jenkins: No] Edema: [Left: Ye] [Right: s] Calf Left: Right: Point of Measurement: 39 cm From Medial Instep 35.5 cm cm Ankle Left: Right: Point of Measurement: 14 cm From Medial Instep 25 cm cm Vascular Assessment Pulses: Dorsalis Pedis Palpable: [Left:No] Electronic Signature(s) Signed: 07/04/2019 6:16:49 PM By: Kela Millin Entered By: Kela Millin on 07/04/2019 15:12:53 -------------------------------------------------------------------------------- Multi Wound Chart Details Patient Name: Date of Service: Curtis Jenkins NA RD G. 07/04/2019 2:30 PM Medical Record Number: [id number] Patient Account Number: [id number] Date of Birth/Sex: Treating RN: 1935-03-08 (84 y.o. Curtis Jenkins Primary Care Kier Smead: [employer] Other Clinician: Referring Curtis Jenkins: Treating Curtis Jenkins/Extender: Garfield Cornea in Treatment: 1 Vital Signs Height(in): 71 Pulse(bpm): 33 Weight(lbs): 250 Blood  Pressure(mmHg): 116/77 Body Mass Index(BMI): 35 Temperature(F): 98.5 Respiratory Rate(breaths/min): 19 Photos: [1:No Photos Left, Lateral Calcaneus] [N/A:N/A N/A] Wound Location: [1:Blister] [N/A:N/A] Wounding Event: [1:Pressure Ulcer] [N/A:N/A] Primary Etiology: [1:05/12/2019] [N/A:N/A] Date Acquired: [1:1] [N/A:N/A] Weeks of Treatment: [1:Open] [N/A:N/A] Wound Status: [1:3.4x3.3x2.3] [N/A:N/A] Measurements L x W x D (cm) [1:8.812] [N/A:N/A] A (cm) : rea [1:20.268] [N/A:N/A] Volume (cm) : [1:8.30%] [N/A:N/A] % Reduction in A rea: [1:-31.80%] [N/A:N/A] % Reduction in Volume: [1:Unstageable/Unclassified] [N/A:N/A] Classification: [1:Medium] [N/A:N/A] Exudate A mount: [1:Purulent] [N/A:N/A] Exudate Type: [1:yellow, brown, green] [N/A:N/A] Exudate Color: [1:Well defined, not attached] [N/A:N/A]  Wound Margin: [1:Small (1-33%)] [N/A:N/A] Granulation A mount: [1:Pink] [N/A:N/A] Granulation Quality: [1:Large (67-100%)] [N/A:N/A] Necrotic A mount: [1:Eschar, Adherent Slough] [N/A:N/A] Necrotic Tissue: [1:Fat Layer (Subcutaneous Tissue)] [N/A:N/A] Exposed Structures: [1:Exposed: Yes Fascia: No Tendon: No Muscle: No Joint: No Bone: No None] [N/A:N/A] Epithelialization: [1:Debridement - Excisional] [N/A:N/A] Debridement: Pre-procedure Verification/Time Out 16:00 [N/A:N/A] Taken: [1:Subcutaneous, Slough] [N/A:N/A] Tissue Debrided: [1:Skin/Subcutaneous Tissue] [N/A:N/A] Level: [1:11.22] [N/A:N/A] Debridement A (sq cm): [1:rea Blade, Forceps] [N/A:N/A] Instrument: [1:Minimum] [N/A:N/A] Bleeding: [1:Pressure] [N/A:N/A] Hemostasis A chieved: [1:0] [N/A:N/A] Procedural Pain: [1:0] [N/A:N/A] Post Procedural Pain: [1:Procedure was tolerated well] [N/A:N/A] Debridement Treatment Response: [1:3.4x3.3x2.3] [N/A:N/A] Post Debridement Measurements L x W x D (cm) [1:20.268] [N/A:N/A] Post Debridement Volume: (cm) [1:Unstageable/Unclassified] [N/A:N/A] Post Debridement Stage: [1:Debridement]  [N/A:N/A] Treatment Notes Electronic Signature(s) Signed: 07/04/2019 6:03:41 PM By: Linton Ham MD Signed: 07/05/2019 5:24:50 PM By: Levan Hurst RN, BSN Entered By: Linton Ham on 07/04/2019 16:08:43 -------------------------------------------------------------------------------- Multi-Disciplinary Care Plan Details Patient Name: Date of Service: MA Curtis Jenkins NA RD G. 07/04/2019 2:30 PM Medical Record Number: [id number] Patient Account Number: [id number] Date of Birth/Sex: Treating RN: 1935/06/03 (84 y.o. Curtis Jenkins Primary Care Annayah Worthley: [employer] Other Clinician: Referring Dejia Ebron: Treating Amaury Kuzel/Extender: Garfield Cornea in Treatment: 1 Active Inactive Orientation to the Wound Care Program Nursing Diagnoses: Knowledge deficit related to the wound healing center program Goals: Patient/caregiver will verbalize understanding of the [place] Date Initiated: 06/23/2019 Target Resolution Date: 07/22/2019 Goal Status: Active Interventions: Provide education on orientation to the wound center Notes: Pain, Acute or Chronic Nursing Diagnoses: Pain, acute or chronic: actual or potential Potential alteration in comfort, pain Goals: Patient will verbalize adequate pain control and receive pain control interventions during procedures as needed Date Initiated: 06/23/2019 Target Resolution Date: 07/22/2019 Goal Status: Active Patient/caregiver will verbalize comfort level met Date Initiated: 06/23/2019 Target Resolution Date: 07/22/2019 Goal Status: Active Interventions: Provide education on pain management Reposition patient for comfort Treatment Activities: Administer pain control measures as ordered : 06/23/2019 Notes: Wound/Skin Impairment Nursing Diagnoses: Knowledge deficit related to ulceration/compromised skin integrity Goals: Patient/caregiver will verbalize understanding of skin care regimen Date Initiated:  06/23/2019 Target Resolution Date: 07/23/2019 Goal Status: Active Interventions: Assess patient/caregiver ability to obtain necessary supplies Assess patient/caregiver ability to perform ulcer/skin care regimen upon admission and as needed Provide education on ulcer and skin care Treatment Activities: Skin care regimen initiated : 06/23/2019 Topical wound management initiated : 06/23/2019 Notes: Electronic Signature(s) Signed: 07/05/2019 5:24:50 PM By: Levan Hurst RN, BSN Entered By: Levan Hurst on 07/04/2019 19:05:35 -------------------------------------------------------------------------------- Pain Assessment Details Patient Name: Date of Service: Curtis Jenkins NA RD G. 07/04/2019 2:30 PM Medical Record Number: [id number] Patient Account Number: [id number] Date of Birth/Sex: Treating RN: 07-Dec-1935 (84 y.o. Curtis Jenkins Primary Care Johnmark Geiger: [employer] Other Clinician: Referring Lashaundra Lehrmann: Treating Susannah Carbin/Extender: Garfield Cornea in Treatment: 1 Active Problems Location of Pain Severity and Description of Pain Patient Has Paino No Site Locations Pain Management and Medication Current Pain Management: Electronic Signature(s) Signed: 07/04/2019 6:16:49 PM By: Kela Millin Entered By: Kela Millin on 07/04/2019 15:12:42 -------------------------------------------------------------------------------- Patient/Caregiver Education Details Patient Name: Date of Service: MA Curtis Jenkins NA RD G. 6/21/2021andnbsp2:30 PM Medical Record Number: [id number] Patient Account Number: [id number] Date of Birth/Gender: Treating RN: September 09, 1935 (83 y.o. Curtis Jenkins Primary Care Physician: [employer] Other Clinician: Referring Physician: Treating Physician/Extender: Garfield Cornea in Treatment: 1 Education Assessment Education Provided To: Patient Education Topics Provided Wound/Skin Impairment: Methods:  Explain/Verbal Responses:  State content correctly Electronic Signature(s) Signed: 07/05/2019 5:24:50 PM By: Levan Hurst RN, BSN Entered By: Levan Hurst on 07/04/2019 19:05:44 -------------------------------------------------------------------------------- Wound Assessment Details Patient Name: Date of Service: MA Curtis Jenkins NA RD G. 07/04/2019 2:30 PM Medical Record Number: [id number] Patient Account Number: [id number] Date of Birth/Sex: Treating RN: [birth year] (84 y.o. Curtis Jenkins Primary Care Tenille Morrill: [employer] Other Clinician: Referring Yanelli Zapanta: Treating Reyonna Haack/Extender: Garfield Cornea in Treatment: 1 Wound Status Wound Number: 1 Primary Etiology: Pressure Ulcer Wound Location: Left, Lateral Calcaneus Wound Status: Open Wounding Event: Blister Date Acquired: 05/12/2019 Weeks Of Treatment: 1 Clustered Wound: No Photos Wound Measurements Length: (cm) 3.4 Width: (cm) 3.3 Depth: (cm) 2.3 Area: (cm) 8.812 Volume: (cm) 20.268 % Reduction in Area: 8.3% % Reduction in Volume: -31.8% Epithelialization: None Tunneling: No Undermining: No Wound Description Classification: Unstageable/Unclassified Wound Margin: Well defined, not attached Exudate Amount: Medium Exudate Type: Purulent Exudate Color: yellow, brown, green Foul Odor After Cleansing: No Slough/Fibrino Yes Wound Bed Granulation Amount: Small (1-33%) Exposed Structure Granulation Quality: Pink Fascia Exposed: No Necrotic Amount: Large (67-100%) Fat Layer (Subcutaneous Tissue) Exposed: Yes Necrotic Quality: Eschar, Adherent Slough Tendon Exposed: No Muscle Exposed: No Joint Exposed: No Bone Exposed: No Treatment Notes Wound #1 (Left, Lateral Calcaneus) 1. Cleanse With Wound Cleanser 3. Primary Dressing Applied Calcium Alginate Ag 4. Secondary Dressing Dry Gauze Roll Gauze Heel Cup 5. Secured With Medipore tape 7. Footwear/Offloading device  applied Multipodus Splint/Boot Notes netting Electronic Signature(s) Signed: 07/06/2019 5:20:20 PM By: Minerva Fester Signed: 07/07/2019 5:29:15 PM By: Kela Millin Previous Signature: 07/04/2019 6:16:49 PM Version By: Kela Millin Entered By: Minerva Fester on 07/06/2019 09:24:56 -------------------------------------------------------------------------------- Vitals Details Patient Name: Date of Service: MA Curtis Jenkins NA RD G. 07/04/2019 2:30 PM Medical Record Number: [id number] Patient Account Number: [id number] Date of Birth/Sex: Treating RN: [birth year] (84 y.o. Curtis Jenkins Primary Care Shaquasia Caponigro: [employer] Other Clinician: Referring Mallie Linnemann: Treating Gloria Ricardo/Extender: Garfield Cornea in Treatment: 1 Vital Signs Time Taken: 15:00 Temperature (F): 98.5 Height (in): 71 Pulse (bpm): 65 Weight (lbs): 250 Respiratory Rate (breaths/min): 19 Body Mass Index (BMI): 34.9 Blood Pressure (mmHg): 116/77 Reference Range: 80 - 120 mg / dl Electronic Signature(s) Signed: 07/04/2019 6:16:49 PM By: Kela Millin Entered By: Kela Millin on 07/04/2019 15:12:36

## 2019-07-05 NOTE — Progress Notes (Signed)
ROXAS, CLYMER ([id number]) Visit Report for 07/04/2019 Debridement Details Patient Name: Date of Service: [name] 07/04/2019 2:30 PM Medical Record Number: [id number] Patient Account Number: [id number] Date of Birth/Sex: Treating RN: 13-Jun-1935 (84 y.o. [name] Primary Care Provider: [employer] Other Clinician: Referring Provider: Treating Provider/Extender: Garfield Cornea in Treatment: 1 Debridement Performed for Assessment: Wound #1 Left,Lateral Calcaneus Performed By: Physician Ricard Dillon., MD Debridement Type: Debridement Level of Consciousness (Pre-procedure): Awake and Alert Pre-procedure Verification/Time Out Yes - 16:00 Taken: Start Time: 16:00 T Area Debrided (L x W): otal 3.4 (cm) x 3.3 (cm) = 11.22 (cm) Tissue and other material debrided: Viable, Non-Viable, Muscle, Slough, Subcutaneous, Slough Level: Skin/Subcutaneous Tissue/Muscle Debridement Description: Excisional Instrument: Blade, Forceps Bleeding: Minimum Hemostasis Achieved: Pressure End Time: 16:02 Procedural Pain: 0 Post Procedural Pain: 0 Response to Treatment: Procedure was tolerated well Level of Consciousness (Post- Awake and Alert procedure): Post Debridement Measurements of Total Wound Length: (cm) 3.4 Stage: Unstageable/Unclassified Width: (cm) 3.3 Depth: (cm) 2.3 Volume: (cm) 20.268 Character of Wound/Ulcer Post Debridement: Requires Further Debridement Post Procedure Diagnosis Same as Pre-procedure Electronic Signature(s) Signed: 07/04/2019 6:03:41 PM By: Linton Ham MD Signed: 07/05/2019 5:24:50 PM By: Levan Hurst RN, BSN Entered By: Linton Ham on 07/04/2019 16:09:21 -------------------------------------------------------------------------------- HPI Details Patient Name: Date of Service: [name] NA RD G. 07/04/2019 2:30 PM Medical Record Number: [id number] Patient Account Number: [id number] Date of Birth/Sex:  Treating RN: 1935/07/08 (84 y.o. [name] Primary Care Provider: [employer] Other Clinician: Referring Provider: Treating Provider/Extender: Garfield Cornea in Treatment: 1 History of Present Illness HPI Description: ADMISSION 06/23/19 This is an 84 year old man who lives in [place]. He was admitted to hospital in March with altered functional level. 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 He came home with physical therapy at some point he was discovered that he had pressure ulcer on the left heel which started as a blister. He has had home health [[facility] going out to change dressings. They have been using Medihoney. The patient is minimally ambulatory only for transfers. They have been offloading the heel by using pillows etc. He spends most of his day in bed or in a recliner. 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 His daughter lives next door and there are other family members close by. ABI in our clinic was 0.77 on the left 06/28/2019; patient I admitted to the clinic last week who has a deep pressure ulcer on the left heel. Culture I did of this area showed Enterococcus and staph epidermidis. Empirically put him on Augmentin last week which will continue for another week. His x-ray suggested possible calcaneal erosion. He will also require an MRI. We are using silver alginate. Arterial studies are on 6/22 6/21; this is a patient with a deep pressure ulcer on the left heel. He has his arterial studies tomorrow however currently his MRI is only booked for 7/14 at [facility]. This will not suffice. He is still on Augmentin that I started him on last week. Culture of the area  superficially I did showed Enterococcus. Electronic Signature(s) Signed: 07/04/2019 6:03:41 PM By: Linton Ham MD Entered By: Linton Ham on 07/04/2019 16:10:13 -------------------------------------------------------------------------------- Physical Exam Details Patient Name: Date of Service:  MA [place], LEO NA RD G. 07/04/2019 2:30 PM Medical Record Number: [id number] Patient Account Number: [id number] Date of Birth/Sex: Treating RN: 1935-10-22 (84 y.o. [name] Primary Care Provider: [employer] Other Clinician: Referring Provider: Treating Provider/Extender: Garfield Cornea in Treatment: 1 Constitutional Sitting or standing Blood Pressure is within target range for patient.. Pulse regular and within target range for patient.Marland Kitchen Respirations regular, non-labored and within target range.. Temperature is normal and within the target range for the patient.Marland Kitchen Appears in no distress. Notes Wound exam; lateral calcaneus. Still necrotic fat and muscle. Using a 10 scalpel I removed necrotic fat and muscle. I think this will ultimately go straight to bone. The erythema around the wound is considerably better however I suspect this will ultimately be a stage IV wound. Electronic Signature(s) Signed: 07/04/2019 6:03:41 PM By: Linton Ham MD Entered By: Linton Ham on 07/04/2019 16:11:17 -------------------------------------------------------------------------------- Physician Orders Details Patient Name: Date of Service: [name] NA RD G. 07/04/2019 2:30 PM Medical Record Number: [id number] Patient Account Number: [id number] Date of Birth/Sex: Treating RN: 1935/10/02 (84 y.o. 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 Dressing Change Frequency Wound #1 Left,Lateral Calcaneus Change dressing every day. - home health change twice a week. wound center weekly. all other days family to change. Wound Cleansing Wound #1 Left,Lateral Calcaneus Clean wound with Wound Cleanser - or wash with soap and water Primary Wound Dressing Wound #1 Left,Lateral Calcaneus Calcium Alginate with Silver Secondary Dressing Wound #1 Left,Lateral Calcaneus Kerlix/Rolled Gauze Dry Gauze Heel Cup Edema Control Elevate legs to the level of the heart or above for 30 minutes daily and/or when sitting, a frequency of: - throughout the day. Off-Loading Other: - float heels with bunny boots or pillows while rest in bed or chair to aid in offloading pressure to heels. [facility] for wound care. - [facility] Electronic Signature(s) Signed: 07/04/2019 6:03:41 PM By: Linton Ham MD Signed: 07/05/2019 5:24:50 PM By: Levan Hurst RN, BSN Entered By: Levan Hurst on 07/04/2019 16:04:10 -------------------------------------------------------------------------------- Problem List Details Patient Name: Date of Service: [name] NA RD G. 07/04/2019 2:30 PM Medical Record Number: [id number] Patient Account Number: [id number] Date of Birth/Sex: Treating RN: 14-Aug-1935 (84 y.o. 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 07/04/2019 2:30 PM Medical Record Number: [id number] Patient Account Number: [id number] Date of Birth/Sex: Treating RN: 1935/10/29 (84 y.o. [name] Primary Care Provider: [employer] Other Clinician: Referring Provider: Treating Provider/Extender: Garfield Cornea in  Treatment: 1 Subjective History of Present Illness (HPI) ADMISSION 06/23/19 This is an 84 year old man who lives in [place]. He was admitted to hospital in March with altered functional level. 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 He came home with physical therapy at some point he was discovered that he had pressure ulcer on the left heel which started as a blister. He has had home health [[facility]} going out to change dressings. They have been using Medihoney. The patient is minimally ambulatory only for transfers. They have been offloading the heel by using pillows etc. He spends most of his day in bed or in a recliner. 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 His daughter lives next door and there are other family members close by. ABI in our clinic was 0.77 on the left 06/28/2019; patient I admitted to the clinic last week who has a  deep pressure ulcer on the left heel. Culture I did of this area showed Enterococcus and staph epidermidis. Empirically put him on Augmentin last week which will continue for another week. His x-ray suggested possible calcaneal erosion. He will also require an MRI. We are using silver alginate. Arterial studies are on 6/22 6/21; this is a patient with a deep pressure ulcer on the left heel. He has his arterial studies tomorrow however currently his MRI is only booked for 7/14 at [facility]. This will not suffice. He is still on Augmentin that I started him on last week. Culture of the area superficially I did showed Enterococcus. Objective Constitutional Sitting or standing Blood Pressure is within target range for patient.. Pulse regular and within target range for patient.Marland Kitchen Respirations regular, non-labored and within target range.. Temperature is normal and within the target range for the patient.Marland Kitchen Appears in no distress. Vitals Time Taken: 3:00 PM, Height: 71 in, Weight: 250 lbs, BMI: 34.9, Temperature: 98.5 F, Pulse: 65 bpm, Respiratory Rate: 19 breaths/min, Blood Pressure: 116/77 mmHg. General Notes: Wound exam; lateral calcaneus. Still necrotic fat and muscle. Using a 10 scalpel I removed necrotic fat and muscle. I think this will ultimately go straight to bone. The erythema around the wound is considerably better however I suspect this will ultimately be a stage IV wound. Integumentary (Hair, Skin) Wound #1 status is Open. Original cause of wound was Blister. The wound is located on the Left,Lateral Calcaneus. The wound measures 3.4cm length x 3.3cm width x 2.3cm depth; 8.812cm^2 area and 20.268cm^3 volume. There is Fat Layer (Subcutaneous Tissue) Exposed exposed. There is no tunneling or undermining noted. There is a medium amount of purulent drainage noted. The wound margin is well defined and not attached to the wound base. There is small (1-33%) pink granulation within the wound bed.  There is a large (67-100%) amount of necrotic tissue within the wound bed including Eschar and Adherent Slough. 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 There was a Excisional Skin/Subcutaneous Tissue/Muscle Debridement with a total area of 11.22 sq cm performed by Ricard Dillon., MD. With the following instrument(s): Blade, and Forceps to remove  Viable and Non-Viable tissue/material. Material removed includes Muscle, Subcutaneous Tissue, and Slough. No specimens were taken. A time out was conducted at 16:00, prior to the start of the procedure. A Minimum amount of bleeding was controlled with Pressure. The procedure was tolerated well with a pain level of 0 throughout and a pain level of 0 following the procedure. Post Debridement Measurements: 3.4cm length x 3.3cm width x 2.3cm depth; 20.268cm^3 volume. Post debridement Stage noted as Unstageable/Unclassified. Character of Wound/Ulcer Post Debridement requires further debridement. Post procedure Diagnosis Wound #1: Same as Pre-Procedure Plan Follow-up Appointments: Return Appointment in 1 week. Dressing Change Frequency: Wound #1 Left,Lateral Calcaneus: Change dressing every day. - home health change twice a week. wound center weekly. all other days family to change. Wound Cleansing: Wound #1 Left,Lateral Calcaneus: Clean wound with Wound Cleanser - or wash with soap and water Primary Wound Dressing: Wound #1 Left,Lateral Calcaneus: Calcium Alginate with Silver Secondary Dressing: Wound #1 Left,Lateral Calcaneus: Kerlix/Rolled Gauze Dry Gauze Heel Cup Edema Control: Elevate legs to the level of the heart or above for 30 minutes daily  and/or when sitting, a frequency of: - throughout the day. Off-Loading: Other: - float heels with bunny boots or pillows while rest in bed or chair to aid in offloading pressure to heels. Home Health: [facility] for wound care. - [facility] 1. Continue with silver alginate for another week although may need to change to Iodoflex 2. We will look at the arterial studies tomorrow 3. The MRI for 7/14 does not work. We will need to know before that whether the patient has underlying osteomyelitis in the calcaneus 4. Extensive debridement as noted. I am still not able to get to a viable surface I expect this is necrotic right down to bone. Electronic Signature(s) Signed: 07/04/2019 6:03:41 PM By: Linton Ham MD Entered By: Linton Ham on 07/04/2019 16:12:26 -------------------------------------------------------------------------------- SuperBill Details Patient Name: Date of Service: [name] NA RD G. 07/04/2019 Medical Record Number: [id number] Patient Account Number: [id number] Date of Birth/Sex: Treating RN: [birth year] (84 y.o. 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.3 Idiopathic progressive neuropathy Facility Procedures CPT4 Code: [id number] Description: 62130 - DEB MUSC/FASCIA 20 SQ CM/< ICD-10 Diagnosis Description L89.620 Pressure ulcer of left heel, unstageable I70.244 Atherosclerosis of native arteries of left leg with ulceration of heel and midfo Modifier: ot Quantity: 1 Physician Procedures : CPT4 Code Description Modifier [id number] - WC PHYS DEBR MUSCLE/FASCIA 20 SQ CM ICD-10  Diagnosis Description L89.620 Pressure ulcer of left heel, unstageable I70.244 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

## 2019-07-05 NOTE — Patient Instructions (Signed)
Your procedure is scheduled on: 07/14/2019  Report to [facility] at 11:00    AM.  Call this number if you have problems the morning of surgery: [phone]   Remember:   Do not Eat or Drink after midnight         No Smoking the morning of surgery  :  Take these medicines the morning of surgery with A SIP OF WATER:Amlodipine and tramadol if needed    Do not wear jewelry, make-up or nail polish.  Do not wear lotions, powders, or perfumes. You may wear deodorant.  Do not shave 48 hours prior to surgery. Men may shave face and neck.  Do not bring valuables to the hospital.  Contacts, dentures or bridgework may not be worn into surgery.  Leave suitcase in the car. After surgery it may be brought to your room.  For patients admitted to the hospital, checkout time is 11:00 AM the day of discharge.   Patients discharged the day of surgery will not be allowed to drive home.     Transurethral Resection of Bladder Tumor, Care After This sheet gives you information about how to care for yourself after your procedure. Your health care provider may also give you more specific instructions. If you have problems or questions, contact your health care provider. What can I expect after the procedure? After the procedure, it is common to have:  A small amount of blood in your urine for up to 2 weeks.  Soreness or mild pain from your catheter. After your catheter is removed, you may have mild soreness, especially when urinating.  Pain in your lower abdomen. Follow these instructions at home: Medicines   Take over-the-counter and prescription medicines only as told by your health care provider.  If you were prescribed an antibiotic medicine, take it as told by your health care provider. Do not stop taking the antibiotic even if you start to feel better.  Do not drive for 24 hours if you were given a sedative during your procedure.  Ask your health care provider if the medicine prescribed to  you: ? Requires you to avoid driving or using heavy machinery. ? Can cause constipation. You may need to take these actions to prevent or treat constipation:  Take over-the-counter or prescription medicines.  Eat foods that are high in fiber, such as beans, whole grains, and fresh fruits and vegetables.  Limit foods that are high in fat and processed sugars, such as fried or sweet foods. Activity  Return to your normal activities as told by your health care provider. Ask your health care provider what activities are safe for you.  Do not lift anything that is heavier than 10 lb (4.5 kg), or the limit that you are told, until your health care provider says that it is safe.  Avoid intense physical activity for as long as told by your health care provider.  Rest as told by your health care provider.  Avoid sitting for a long time without moving. Get up to take short walks every 1-2 hours. This is important to improve blood flow and breathing. Ask for help if you feel weak or unsteady. General instructions   Do not drink alcohol for as long as told by your health care provider. This is especially important if you are taking prescription pain medicines.  Do not take baths, swim, or use a hot tub until your health care provider approves. Ask your health care provider if you may take showers.  You may only be allowed to take sponge baths.  If you have a catheter, follow instructions from your health care provider about caring for your catheter and your drainage bag.  Drink enough fluid to keep your urine pale yellow.  Wear compression stockings as told by your health care provider. These stockings help to prevent blood clots and reduce swelling in your legs.  Keep all follow-up visits as told by your health care provider. This is important. ? You will need to be followed closely with regular checks of your bladder and urethra (cystoscopies) to make sure that the cancer does not come  back. Contact a health care provider if:  You have pain that gets worse or does not improve with medicine.  You have blood in your urine for more than 2 weeks.  You have cloudy or bad-smelling urine.  You become constipated. Signs of constipation may include having: ? Fewer than three bowel movements in a week. ? Difficulty having a bowel movement. ? Stools that are dry, hard, or larger than normal.  You have a fever. Get help right away if:  You have: ? Severe pain. ? Bright red blood in your urine. ? Blood clots in your urine. ? A lot of blood in your urine.  Your catheter has been removed and you are not able to urinate.  You have a catheter in place and the catheter is not draining urine. Summary  After your procedure, it is common to have a small amount of blood in your urine, soreness or mild pain from your catheter, and pain in your lower abdomen.  Take over-the-counter and prescription medicines only as told by your health care provider.  Rest as told by your health care provider. Follow your health care provider's instructions about returning to normal activities. Ask what activities are safe for you.  If you have a catheter, follow instructions from your health care provider about caring for your catheter and your drainage bag.  Get help right away if you cannot urinate, you have severe pain, or you have bright red blood or blood clots in your urine. This information is not intended to replace advice given to you by your health care provider. Make sure you discuss any questions you have with your health care provider. Document Revised: 07/30/2017 Document Reviewed: 07/30/2017 Elsevier Patient Education  2020 [place] Anesthesia, Adult, Care After This sheet gives you information about how to care for yourself after your procedure. Your health care provider may also give you more specific instructions. If you have problems or questions, contact your  health care provider. What can I expect after the procedure? After the procedure, the following side effects are common:  Pain or discomfort at the IV site.  Nausea.  Vomiting.  Sore throat.  Trouble concentrating.  Feeling cold or chills.  Weak or tired.  Sleepiness and fatigue.  Soreness and body aches. These side effects can affect parts of the body that were not involved in surgery. Follow these instructions at home:  For at least 24 hours after the procedure:  Have a responsible adult stay with you. It is important to have someone help care for you until you are awake and alert.  Rest as needed.  Do not: ? Participate in activities in which you could fall or become injured. ? Drive. ? Use heavy machinery. ? Drink alcohol. ? Take sleeping pills or medicines that cause drowsiness. ? Make important decisions or sign legal documents. ?  Take care of children on your own. Eating and drinking  Follow any instructions from your health care provider about eating or drinking restrictions.  When you feel hungry, start by eating small amounts of foods that are soft and easy to digest (bland), such as toast. Gradually return to your regular diet.  Drink enough fluid to keep your urine pale yellow.  If you vomit, rehydrate by drinking water, juice, or clear broth. General instructions  If you have sleep apnea, surgery and certain medicines can increase your risk for breathing problems. Follow instructions from your health care provider about wearing your sleep device: ? Anytime you are sleeping, including during daytime naps. ? While taking prescription pain medicines, sleeping medicines, or medicines that make you drowsy.  Return to your normal activities as told by your health care provider. Ask your health care provider what activities are safe for you.  Take over-the-counter and prescription medicines only as told by your health care provider.  If you smoke, do not  smoke without supervision.  Keep all follow-up visits as told by your health care provider. This is important. Contact a health care provider if:  You have nausea or vomiting that does not get better with medicine.  You cannot eat or drink without vomiting.  You have pain that does not get better with medicine.  You are unable to pass urine.  You develop a skin rash.  You have a fever.  You have redness around your IV site that gets worse. Get help right away if:  You have difficulty breathing.  You have chest pain.  You have blood in your urine or stool, or you vomit blood. Summary  After the procedure, it is common to have a sore throat or nausea. It is also common to feel tired.  Have a responsible adult stay with you for the first 24 hours after general anesthesia. It is important to have someone help care for you until you are awake and alert.  When you feel hungry, start by eating small amounts of foods that are soft and easy to digest (bland), such as toast. Gradually return to your regular diet.  Drink enough fluid to keep your urine pale yellow.  Return to your normal activities as told by your health care provider. Ask your health care provider what activities are safe for you. This information is not intended to replace advice given to you by your health care provider. Make sure you discuss any questions you have with your health care provider. Document Revised: 01/02/2017 Document Reviewed: 08/15/2016 Elsevier Patient Education  [place].

## 2019-07-06 DIAGNOSIS — N182 Chronic kidney disease, stage 2 (mild): Secondary | ICD-10-CM | POA: Diagnosis not present

## 2019-07-06 DIAGNOSIS — M25422 Effusion, left elbow: Secondary | ICD-10-CM | POA: Diagnosis not present

## 2019-07-06 DIAGNOSIS — Z720 Tobacco use: Secondary | ICD-10-CM | POA: Diagnosis not present

## 2019-07-06 DIAGNOSIS — I129 Hypertensive chronic kidney disease with stage 1 through stage 4 chronic kidney disease, or unspecified chronic kidney disease: Secondary | ICD-10-CM | POA: Diagnosis not present

## 2019-07-06 DIAGNOSIS — I69391 Dysphagia following cerebral infarction: Secondary | ICD-10-CM | POA: Diagnosis not present

## 2019-07-06 DIAGNOSIS — E785 Hyperlipidemia, unspecified: Secondary | ICD-10-CM | POA: Diagnosis not present

## 2019-07-06 DIAGNOSIS — I69954 Hemiplegia and hemiparesis following unspecified cerebrovascular disease affecting left non-dominant side: Secondary | ICD-10-CM | POA: Diagnosis not present

## 2019-07-06 DIAGNOSIS — M159 Polyosteoarthritis, unspecified: Secondary | ICD-10-CM | POA: Diagnosis not present

## 2019-07-06 DIAGNOSIS — L89620 Pressure ulcer of left heel, unstageable: Secondary | ICD-10-CM | POA: Diagnosis not present

## 2019-07-06 DIAGNOSIS — R1310 Dysphagia, unspecified: Secondary | ICD-10-CM | POA: Diagnosis not present

## 2019-07-07 DIAGNOSIS — I129 Hypertensive chronic kidney disease with stage 1 through stage 4 chronic kidney disease, or unspecified chronic kidney disease: Secondary | ICD-10-CM | POA: Diagnosis not present

## 2019-07-07 DIAGNOSIS — I69391 Dysphagia following cerebral infarction: Secondary | ICD-10-CM | POA: Diagnosis not present

## 2019-07-07 DIAGNOSIS — L89620 Pressure ulcer of left heel, unstageable: Secondary | ICD-10-CM | POA: Diagnosis not present

## 2019-07-07 DIAGNOSIS — I69954 Hemiplegia and hemiparesis following unspecified cerebrovascular disease affecting left non-dominant side: Secondary | ICD-10-CM | POA: Diagnosis not present

## 2019-07-07 DIAGNOSIS — E785 Hyperlipidemia, unspecified: Secondary | ICD-10-CM | POA: Diagnosis not present

## 2019-07-07 DIAGNOSIS — N182 Chronic kidney disease, stage 2 (mild): Secondary | ICD-10-CM | POA: Diagnosis not present

## 2019-07-07 DIAGNOSIS — R1310 Dysphagia, unspecified: Secondary | ICD-10-CM | POA: Diagnosis not present

## 2019-07-07 DIAGNOSIS — M159 Polyosteoarthritis, unspecified: Secondary | ICD-10-CM | POA: Diagnosis not present

## 2019-07-07 DIAGNOSIS — M25422 Effusion, left elbow: Secondary | ICD-10-CM | POA: Diagnosis not present

## 2019-07-07 DIAGNOSIS — Z720 Tobacco use: Secondary | ICD-10-CM | POA: Diagnosis not present

## 2019-07-08 DIAGNOSIS — Z720 Tobacco use: Secondary | ICD-10-CM | POA: Diagnosis not present

## 2019-07-08 DIAGNOSIS — E785 Hyperlipidemia, unspecified: Secondary | ICD-10-CM | POA: Diagnosis not present

## 2019-07-08 DIAGNOSIS — I129 Hypertensive chronic kidney disease with stage 1 through stage 4 chronic kidney disease, or unspecified chronic kidney disease: Secondary | ICD-10-CM | POA: Diagnosis not present

## 2019-07-08 DIAGNOSIS — N182 Chronic kidney disease, stage 2 (mild): Secondary | ICD-10-CM | POA: Diagnosis not present

## 2019-07-08 DIAGNOSIS — M159 Polyosteoarthritis, unspecified: Secondary | ICD-10-CM | POA: Diagnosis not present

## 2019-07-08 DIAGNOSIS — I69391 Dysphagia following cerebral infarction: Secondary | ICD-10-CM | POA: Diagnosis not present

## 2019-07-08 DIAGNOSIS — I69954 Hemiplegia and hemiparesis following unspecified cerebrovascular disease affecting left non-dominant side: Secondary | ICD-10-CM | POA: Diagnosis not present

## 2019-07-08 DIAGNOSIS — M25422 Effusion, left elbow: Secondary | ICD-10-CM | POA: Diagnosis not present

## 2019-07-08 DIAGNOSIS — L89321 Pressure ulcer of left buttock, stage 1: Secondary | ICD-10-CM | POA: Diagnosis not present

## 2019-07-08 DIAGNOSIS — R1310 Dysphagia, unspecified: Secondary | ICD-10-CM | POA: Diagnosis not present

## 2019-07-08 DIAGNOSIS — L89620 Pressure ulcer of left heel, unstageable: Secondary | ICD-10-CM | POA: Diagnosis not present

## 2019-07-11 ENCOUNTER — Telehealth: Payer: Self-pay

## 2019-07-11 ENCOUNTER — Encounter (HOSPITAL_BASED_OUTPATIENT_CLINIC_OR_DEPARTMENT_OTHER): Payer: Medicare Other | Admitting: Internal Medicine

## 2019-07-11 DIAGNOSIS — L89620 Pressure ulcer of left heel, unstageable: Secondary | ICD-10-CM | POA: Diagnosis not present

## 2019-07-11 DIAGNOSIS — Z8673 Personal history of transient ischemic attack (TIA), and cerebral infarction without residual deficits: Secondary | ICD-10-CM | POA: Diagnosis not present

## 2019-07-11 DIAGNOSIS — L03116 Cellulitis of left lower limb: Secondary | ICD-10-CM | POA: Diagnosis not present

## 2019-07-11 DIAGNOSIS — I70202 Unspecified atherosclerosis of native arteries of extremities, left leg: Secondary | ICD-10-CM | POA: Diagnosis not present

## 2019-07-11 DIAGNOSIS — I10 Essential (primary) hypertension: Secondary | ICD-10-CM | POA: Diagnosis not present

## 2019-07-11 DIAGNOSIS — Z923 Personal history of irradiation: Secondary | ICD-10-CM | POA: Diagnosis not present

## 2019-07-11 DIAGNOSIS — I4891 Unspecified atrial fibrillation: Secondary | ICD-10-CM | POA: Diagnosis not present

## 2019-07-11 DIAGNOSIS — B957 Other staphylococcus as the cause of diseases classified elsewhere: Secondary | ICD-10-CM | POA: Diagnosis not present

## 2019-07-11 DIAGNOSIS — B952 Enterococcus as the cause of diseases classified elsewhere: Secondary | ICD-10-CM | POA: Diagnosis not present

## 2019-07-11 DIAGNOSIS — I639 Cerebral infarction, unspecified: Secondary | ICD-10-CM | POA: Diagnosis not present

## 2019-07-11 DIAGNOSIS — Z7901 Long term (current) use of anticoagulants: Secondary | ICD-10-CM | POA: Diagnosis not present

## 2019-07-11 DIAGNOSIS — R1310 Dysphagia, unspecified: Secondary | ICD-10-CM | POA: Diagnosis not present

## 2019-07-11 DIAGNOSIS — L8990 Pressure ulcer of unspecified site, unspecified stage: Secondary | ICD-10-CM | POA: Diagnosis not present

## 2019-07-11 DIAGNOSIS — G603 Idiopathic progressive neuropathy: Secondary | ICD-10-CM | POA: Diagnosis not present

## 2019-07-11 NOTE — Telephone Encounter (Signed)
Pt daughter called- pt is having issues with an ulcer on his foot and is needing MRI to confirm if infection is in bone per daughter. Daughter is concerned surgery my cause pt to miss MRI on Saturday after the surgery. Daughter called to post pone surgery to end of the month. Message sent to surgery scheduler

## 2019-07-11 NOTE — Progress Notes (Signed)
WINDSOR, GOEKEN ([id number]) Visit Report for 07/11/2019 Arrival Information Details Patient Name: Date of Service: [place] RD G. 07/11/2019 2:30 PM Medical Record Number: [id number] Patient Account Number: [id number] Date of Birth/Sex: Treating RN: 1935/01/26 (84 y.o. [name] Primary Care Shameka Aggarwal: [employer] Other Clinician: Referring Carmelina Balducci: Treating Vernella Niznik/Extender: Garfield Cornea in Treatment: 2 Visit Information History Since Last Visit Added or deleted any medications: No Patient Arrived: Wheel Chair Any new allergies or adverse reactions: No Arrival Time: 14:28 Had a fall or experienced change in No Accompanied By: daughter activities of daily living that may affect Transfer Assistance: Manual risk of falls: Patient Identification Verified: Yes Signs or symptoms of abuse/neglect since last visito No Secondary Verification Process Completed: Yes Hospitalized since last visit: No Patient Requires Transmission-Based Precautions: No Implantable device outside of the clinic excluding No Patient Has Alerts: Yes cellular tissue based products placed in the center Patient Alerts: Left ABI: 0.77 since last visit: Has Dressing in Place as Prescribed: Yes Pain Present Now: No Electronic Signature(s) Signed: 07/11/2019 5:58:35 PM By: Kela Millin Entered By: Kela Millin on 07/11/2019 14:31:40 -------------------------------------------------------------------------------- Lower Extremity Assessment Details Patient Name: Date of Service: [name] NA RD G. 07/11/2019 2:30 PM Medical Record Number: [id number] Patient Account Number: [id number] Date of Birth/Sex: Treating RN: Sep 07, 1935 (84 y.o. [name] Primary Care Alyzza Andringa: [employer] Other Clinician: Referring Fabiana Dromgoole: Treating Geniene List/Extender: Garfield Cornea in Treatment: 2 Edema Assessment Assessed: [name]: No]  Patrice Paradise: No] Edema: [Left: Ye] [Right: s] Calf Left: Right: Point of Measurement: 39 cm From Medial Instep 35.5 cm cm Ankle Left: Right: Point of Measurement: 14 cm From Medial Instep 25 cm cm Vascular Assessment Pulses: Dorsalis Pedis Palpable: [Left:Yes] Electronic Signature(s) Signed: 07/11/2019 5:58:35 PM By: Kela Millin Entered By: Kela Millin on 07/11/2019 14:32:49 -------------------------------------------------------------------------------- Multi Wound Chart Details Patient Name: Date of Service: [name] NA RD G. 07/11/2019 2:30 PM Medical Record Number: [id number] Patient Account Number: [id number] Date of Birth/Sex: Treating RN: [birth year] (84 y.o. [name] Primary Care Kiyra Slaubaugh: [employer] Other Clinician: Referring Emmajean Ratledge: Treating Jasleen Riepe/Extender: Garfield Cornea in Treatment: 2 Vital Signs Height(in): 71 Pulse(bpm): 75 Weight(lbs): 250 Blood Pressure(mmHg): 143/77 Body Mass Index(BMI): 35 Temperature(F): 98.6 Respiratory Rate(breaths/min): 19 Photos: [1:No Photos Left, Lateral Calcaneus] [N/A:N/A N/A] Wound Location: [1:Blister] [N/A:N/A] Wounding Event: [1:Pressure Ulcer] [N/A:N/A] Primary Etiology: [1:05/12/2019] [N/A:N/A] Date Acquired: [1:2] [N/A:N/A] Weeks of Treatment: [1:Open] [N/A:N/A] Wound Status: [1:4.1x3.3x2.5] [N/A:N/A] Measurements L x W x D (cm) [1:10.626] [N/A:N/A] A (cm) : rea [1:26.566] [N/A:N/A] Volume (cm) : [1:-10.50%] [N/A:N/A] % Reduction in A rea: [1:-72.70%] [N/A:N/A] % Reduction in Volume: [1:Unstageable/Unclassified] [N/A:N/A] Classification: [1:Medium] [N/A:N/A] Exudate A mount: [1:Purulent] [N/A:N/A] Exudate Type: [1:yellow, brown, green] [N/A:N/A] Exudate Color: [1:Well defined, not attached] [N/A:N/A] Wound Margin: [1:Small (1-33%)] [N/A:N/A] Granulation A mount: [1:Pink] [N/A:N/A] Granulation Quality: [1:Large (67-100%)] [N/A:N/A] Necrotic A mount: [1:Eschar,  Adherent Slough] [N/A:N/A] Necrotic Tissue: [1:Fat Layer (Subcutaneous Tissue)] [N/A:N/A] Exposed Structures: [1:Exposed: Yes Fascia: No Tendon: No Muscle: No Joint: No Bone: No None] [N/A:N/A] Epithelialization: [1:Debridement - Excisional] [N/A:N/A] Debridement: Pre-procedure Verification/Time Out 14:55 [N/A:N/A] Taken: [1:Subcutaneous, Slough] [N/A:N/A] Tissue Debrided: [1:Skin/Subcutaneous Tissue] [N/A:N/A] Level: [1:13.53] [N/A:N/A] Debridement A (sq cm): [1:rea Curette] [N/A:N/A] Instrument: [1:Minimum] [N/A:N/A] Bleeding: [1:Pressure] [N/A:N/A] Hemostasis A chieved: [1:0] [N/A:N/A] Procedural Pain: [1:0] [N/A:N/A] Post Procedural Pain: [1:Procedure was tolerated well] [N/A:N/A] Debridement Treatment Response: [1:4.1x3.3x2.5] [N/A:N/A] Post Debridement Measurements L x W x D (cm) [1:26.566] [N/A:N/A] Post Debridement Volume: (  cm) [1:Unstageable/Unclassified] [N/A:N/A] Post Debridement Stage: [1:Debridement] [N/A:N/A] Procedures Performed: Treatment Notes Electronic Signature(s) Signed: 07/11/2019 5:46:39 PM By: Linton Ham MD Signed: 07/11/2019 6:01:25 PM By: Levan Hurst RN, BSN Entered By: Linton Ham on 07/11/2019 15:01:26 -------------------------------------------------------------------------------- Multi-Disciplinary Care Plan Details Patient Name: Date of Service: [name] NA RD G. 07/11/2019 2:30 PM Medical Record Number: [id number] Patient Account Number: [id number] Date of Birth/Sex: Treating RN: 02/15/1935 (84 y.o. [name] Primary Care Shaquera Ansley: [employer] Other Clinician: Referring Lennix Rotundo: Treating Perline Awe/Extender: Garfield Cornea in Treatment: 2 Active Inactive Pain, Acute or Chronic Nursing Diagnoses: Pain, acute or chronic: actual or potential Potential alteration in comfort, pain Goals: Patient will verbalize adequate pain control and receive pain control interventions during procedures as  needed Date Initiated: 06/23/2019 Target Resolution Date: 07/22/2019 Goal Status: Active Patient/caregiver will verbalize comfort level met Date Initiated: 06/23/2019 Target Resolution Date: 07/22/2019 Goal Status: Active Interventions: Provide education on pain management Reposition patient for comfort Treatment Activities: Administer pain control measures as ordered : 06/23/2019 Notes: Wound/Skin Impairment Nursing Diagnoses: Knowledge deficit related to ulceration/compromised skin integrity Goals: Patient/caregiver will verbalize understanding of skin care regimen Date Initiated: 06/23/2019 Target Resolution Date: 07/23/2019 Goal Status: Active Interventions: Assess patient/caregiver ability to obtain necessary supplies Assess patient/caregiver ability to perform ulcer/skin care regimen upon admission and as needed Provide education on ulcer and skin care Treatment Activities: Skin care regimen initiated : 06/23/2019 Topical wound management initiated : 06/23/2019 Notes: Electronic Signature(s) Signed: 07/11/2019 6:01:25 PM By: Levan Hurst RN, BSN Entered By: Levan Hurst on 07/11/2019 17:15:50 -------------------------------------------------------------------------------- Pain Assessment Details Patient Name: Date of Service: [name] NA RD G. 07/11/2019 2:30 PM Medical Record Number: [id number] Patient Account Number: [id number] Date of Birth/Sex: Treating RN: 08-29-1935 (84 y.o. [name] Primary Care Jerik Falletta: [employer] Other Clinician: Referring Nicky Milhouse: Treating Joon Pohle/Extender: Garfield Cornea in Treatment: 2 Active Problems Location of Pain Severity and Description of Pain Patient Has Paino No Site Locations Pain Management and Medication Current Pain Management: Electronic Signature(s) Signed: 07/11/2019 5:58:35 PM By: Kela Millin Entered By: Kela Millin on 07/11/2019  14:32:36 -------------------------------------------------------------------------------- Patient/Caregiver Education Details Patient Name: Date of Service: [name] NA RD G. 6/28/2021andnbsp2:30 PM Medical Record Number: [id number] Patient Account Number: [id number] Date of Birth/Gender: Treating RN: 23-Sep-1935 (84 y.o. [name] Primary Care Physician: [employer] Other Clinician: Referring Physician: Treating Physician/Extender: Garfield Cornea in Treatment: 2 Education Assessment Education Provided To: Patient Education Topics Provided Wound/Skin Impairment: Methods: Explain/Verbal Responses: State content correctly Electronic Signature(s) Signed: 07/11/2019 6:01:25 PM By: Levan Hurst RN, BSN Entered By: Levan Hurst on 07/11/2019 17:16:00 -------------------------------------------------------------------------------- Wound Assessment Details Patient Name: Date of Service: [name] NA RD G. 07/11/2019 2:30 PM Medical Record Number: [id number] Patient Account Number: [id number] Date of Birth/Sex: Treating RN: 1935-10-10 (84 y.o. [name] Primary Care Valon Glasscock: [employer] Other Clinician: Referring Vega Stare: Treating Charly Hunton/Extender: Garfield Cornea in Treatment: 2 Wound Status Wound Number: 1 Primary Etiology: Pressure Ulcer Wound Location: Left, Lateral Calcaneus Wound Status: Open Wounding Event: Blister Date Acquired: 05/12/2019 Weeks Of Treatment: 2 Clustered Wound: No Wound Measurements Length: (cm) 4.1 Width: (cm) 3.3 Depth: (cm) 2.5 Area: (cm) 10.626 Volume: (cm) 26.566 % Reduction in Area: -10.5% % Reduction in Volume: -72.7% Epithelialization: None Tunneling: No Undermining: No Wound Description Classification: Unstageable/Unclassified Wound Margin: Well defined, not attached Exudate Amount: Medium Exudate Type: Purulent Exudate Color: yellow, brown,  green Foul Odor After  Cleansing: No Slough/Fibrino Yes Wound Bed Granulation Amount: Small (1-33%) Exposed Structure Granulation Quality: Pink Fascia Exposed: No Necrotic Amount: Large (67-100%) Fat Layer (Subcutaneous Tissue) Exposed: Yes Necrotic Quality: Eschar, Adherent Slough Tendon Exposed: No Muscle Exposed: No Joint Exposed: No Bone Exposed: No Electronic Signature(s) Signed: 07/11/2019 5:58:35 PM By: Kela Millin Entered By: Kela Millin on 07/11/2019 14:38:55 -------------------------------------------------------------------------------- Vitals Details Patient Name: Date of Service: [name] NA RD G. 07/11/2019 2:30 PM Medical Record Number: [id number] Patient Account Number: [id number] Date of Birth/Sex: Treating RN: 01/13/1936 (84 y.o. [name] Primary Care Jackquelyn Sundberg: [employer] Other Clinician: Referring Dudley Mages: Treating Kiffany Schelling/Extender: Garfield Cornea in Treatment: 2 Vital Signs Time Taken: 14:30 Temperature (F): 98.6 Height (in): 71 Pulse (bpm): 62 Weight (lbs): 250 Respiratory Rate (breaths/min): 19 Body Mass Index (BMI): 34.9 Blood Pressure (mmHg): 143/77 Reference Range: 80 - 120 mg / dl Electronic Signature(s) Signed: 07/11/2019 5:58:35 PM By: Kela Millin Entered By: Kela Millin on 07/11/2019 14:32:31

## 2019-07-11 NOTE — Progress Notes (Signed)
Curtis Jenkins ([id number]) Visit Report for 07/11/2019 Debridement Details Patient Name: Date of Service: MA Curtis Jenkins [place] RD G. 07/11/2019 2:30 PM Medical Record Number: [id number] Patient Account Number: [id number] Date of Birth/Sex: Treating RN: 1935-07-21 (84 y.o. Curtis Jenkins Primary Care Provider: [employer] Other Clinician: Referring Provider: Treating Provider/Extender: Garfield Cornea in Treatment: 2 Debridement Performed for Assessment: Wound #1 Left,Lateral Calcaneus Performed By: Physician Ricard Dillon., MD Debridement Type: Debridement Level of Consciousness (Pre-procedure): Awake and Alert Pre-procedure Verification/Time Out Yes - 14:55 Taken: Start Time: 14:55 T Area Debrided (L x W): otal 4.1 (cm) x 3.3 (cm) = 13.53 (cm) Tissue and other material debrided: Viable, Non-Viable, Slough, Subcutaneous, Slough Level: Skin/Subcutaneous Tissue Debridement Description: Excisional Instrument: Curette Bleeding: Minimum Hemostasis Achieved: Pressure End Time: 14:56 Procedural Pain: 0 Post Procedural Pain: 0 Response to Treatment: Procedure was tolerated well Level of Consciousness (Post- Awake and Alert procedure): Post Debridement Measurements of Total Wound Length: (cm) 4.1 Stage: Unstageable/Unclassified Width: (cm) 3.3 Depth: (cm) 2.5 Volume: (cm) 26.566 Character of Wound/Ulcer Post Debridement: Requires Further Debridement Post Procedure Diagnosis Same as Pre-procedure Electronic Signature(s) Signed: 07/11/2019 5:46:39 PM By: Linton Ham MD Signed: 07/11/2019 6:01:25 PM By: Levan Hurst RN, BSN Entered By: Linton Ham on 07/11/2019 15:02:02 -------------------------------------------------------------------------------- HPI Details Patient Name: Date of Service: Curtis Jenkins NA RD G. 07/11/2019 2:30 PM Medical Record Number: [id number] Patient Account Number: [id number] Date of Birth/Sex: Treating RN: [birth year]  (84 y.o. Curtis Jenkins Primary Care Provider: [employer] Other Clinician: Referring Provider: Treating Provider/Extender: Garfield Cornea in Treatment: 2 History of Present Illness HPI Description: ADMISSION 06/23/19 This is an 84 year old man who lives in [place]. He was admitted to hospital in March with altered functional level. 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 He came home with physical therapy at some point he was discovered that he had pressure ulcer on the left heel which started as a blister. He has had home health [[facility] going out to change dressings. They have been using Medih[employer]. The patient is minimally ambulatory only for transfers. They have been offloading the heel by using pillows etc. He spends most of his day in bed or in a recliner. 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 His daughter lives next door and there are other family members close by. ABI in our clinic was 0.77 on the left 06/28/2019; patient I admitted to the clinic last week who has a deep pressure ulcer on the left heel. Culture I did of this area showed Enterococcus and staph epidermidis. Empirically put him on Augmentin last week which will continue for another week. His x-ray suggested possible calcaneal erosion. He will also require an MRI. We are using silver alginate. Arterial studies are on 6/22 6/21; this is a patient with a deep pressure ulcer on the left heel. He has his arterial studies tomorrow however currently his MRI is only booked for 7/14 at [facility]. This will not suffice. He is still on Augmentin that I started him on last week. Culture of the area superficially I did showed  Enterococcus. 6/28; arterial studies got delayed until 6/30. MRI is for 7/3. He has completed Augmentin. We are using silver alginate in the wound Electronic Signature(s) Signed: 07/11/2019 5:46:39  PM By: Linton Ham MD Entered By: Linton Ham on 07/11/2019 15:03:07 -------------------------------------------------------------------------------- Physical Exam Details Patient Name: Date of Service: Curtis Jenkins NA RD G. 07/11/2019 2:30 PM Medical Record Number: [id number] Patient Account Number: [id number] Date of Birth/Sex: Treating RN: 06-04-1935 (84 y.o. Curtis Jenkins Primary Care Provider: [employer] Other Clinician: Referring Provider: Treating Provider/Extender: Garfield Cornea in Treatment: 2 Constitutional Patient is hypertensive.. Pulse regular and within target range for patient.Marland Kitchen Respirations regular, non-labored and within target range.. Temperature is normal and within the target range for the patient.Marland Kitchen Appears in no distress. Respiratory work of breathing is normal. Cardiovascular Cannot feel dorsalis pedis or posterior tibial pulses. Notes Wound exam Lateral calcaneus. Still necrotic fat which I removed with a #5 curette overall. The surface of the wound looks healthy although this is a deep wound that is probing to bone today. No evidence of surrounding soft tissue infection Electronic Signature(s) Signed: 07/11/2019 5:46:39 PM By: Linton Ham MD Entered By: Linton Ham on 07/11/2019 15:04:56 -------------------------------------------------------------------------------- Physician Orders Details Patient Name: Date of Service: MA Curtis Jenkins NA RD G. 07/11/2019 2:30 PM Medical Record Number: [id number] Patient Account Number: [id number] Date of Birth/Sex: Treating RN: 10-07-1935 (84 y.o. 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 Dressing Change Frequency Wound #1 Left,Lateral Calcaneus Change dressing every day. - home health change twice a week. wound center weekly. all other days family to change. Wound Cleansing Wound #1 Left,Lateral Calcaneus Clean wound with Wound Cleanser - or wash with soap and water Primary Wound Dressing Wound #1 Left,Lateral Calcaneus Calcium Alginate with Silver Secondary Dressing Wound #1 Left,Lateral Calcaneus Kerlix/Rolled Gauze Dry Gauze Heel Cup Edema Control Elevate legs to the level of the heart or above for 30 minutes daily and/or when sitting, a frequency of: - throughout the day. Off-Loading Other: - float heels with bunny boots or pillows while rest in bed or chair to aid in offloading pressure to heels. [place] [facility] for wound care. - [facility] Electronic Signature(s) Signed: 07/11/2019 5:46:39 PM By: Linton Ham MD Signed: 07/11/2019 6:01:25 PM By: Levan Hurst RN, BSN Entered By: Levan Hurst on 07/11/2019 14:59:21 -------------------------------------------------------------------------------- Problem List Details Patient Name: Date of Service: MA Curtis Jenkins NA RD G. 07/11/2019 2:30 PM Medical Record Number: [id number] Patient Account Number: [id number] Date of Birth/Sex: Treating RN: 1935/04/18 (84 y.o. 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 07/11/2019 2:30 PM Medical Record Number: [id number] Patient Account Number: [id number] Date of Birth/Sex: Treating RN: Apr 12, 1935 (  84 y.o. Curtis Jenkins Primary Care Provider: [employer] Other Clinician: Referring Provider: Treating Provider/Extender: Garfield Cornea in Treatment: 2 Subjective History of Present Illness (HPI) ADMISSION 06/23/19 This is an 84 year old man who lives in [place]. He was admitted to hospital in March with altered functional level. 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 He came home with physical therapy at some point he was discovered that he had pressure ulcer on the left heel which started as a blister. He has had home health [[facility]} going out to change dressings. They have been using Medihoney. The patient is minimally ambulatory only for transfers. They have been offloading the heel by using pillows etc. He spends most of his day in bed or in a recliner. 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 His daughter lives next door and  there are other family members close by. ABI in our clinic was 0.77 on the left 06/28/2019; patient I admitted to the clinic last week who has a deep pressure ulcer on the left heel. Culture I did of this area showed Enterococcus and staph epidermidis. Empirically put him on Augmentin last week which will continue for another week. His x-ray suggested possible calcaneal erosion. He will also require an MRI. We are using silver alginate. Arterial studies are on 6/22 6/21; this is a patient with a deep pressure ulcer on the left heel. He has his arterial studies tomorrow however currently his MRI is only booked for 7/14 at [facility]. This will not suffice. He is still on Augmentin that I started him on last week. Culture of the area superficially I did showed Enterococcus. 6/28; arterial studies got delayed until 6/30. MRI is for 7/3. He has completed Augmentin. We are using silver alginate in the wound Objective Constitutional Patient is hypertensive.. Pulse regular and within target range for patient.Marland Kitchen Respirations regular, non-labored and within target range.. Temperature is normal and within the target range for the patient.Marland Kitchen Appears in no distress. Vitals Time Taken: 2:30 PM, Height: 71 in, Weight: 250 lbs, BMI: 34.9, Temperature: 98.6 F, Pulse: 62 bpm, Respiratory Rate: 19 breaths/min, Blood Pressure: 143/77 mmHg. Respiratory work of breathing is normal. Cardiovascular Cannot feel dorsalis pedis or posterior tibial pulses. General Notes: Wound exam ooLateral calcaneus. Still necrotic fat which I removed with a #5 curette overall. The surface of the wound looks healthy although this is a deep wound that is probing to bone today. No evidence of surrounding soft tissue infection Integumentary (Hair, Skin) Wound #1 status is Open. Original cause of wound was Blister. The wound is located on the Left,Lateral Calcaneus. The wound measures 4.1cm length x 3.3cm width x 2.5cm depth; 10.626cm^2  area and 26.566cm^3 volume. There is Fat Layer (Subcutaneous Tissue) Exposed exposed. There is no tunneling or undermining noted. There is a medium amount of purulent drainage noted. The wound margin is well defined and not attached to the wound base. There is small (1-33%) pink granulation within the wound bed. There is a large (67-100%) amount of necrotic tissue within the wound bed including Eschar and Adherent Slough. 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 There was a Excisional Skin/Subcutaneous Tissue Debridement with a total area of 13.53 sq cm performed by Ricard Dillon., MD. With the following instrument(s): Curette to remove Viable and Non- Viable tissue/material. Material removed includes Subcutaneous Tissue and Slough and. No specimens were taken. A time out was conducted at 14:55, prior to the start of the procedure. A Minimum amount of bleeding was controlled with Pressure. The procedure was tolerated well with a pain level of 0 throughout and a pain level of 0 following the procedure. Post Debridement Measurements: 4.1cm length x 3.3cm width x 2.5cm depth; 26.566cm^3 volume. Post debridement Stage noted as Unstageable/Unclassified. Character of Wound/Ulcer Post Debridement requires further debridement. Post procedure Diagnosis Wound #1: Same as Pre-Procedure Plan Follow-up Appointments: Return Appointment in 1 week. Dressing Change Frequency: Wound #1 Left,Lateral Calcaneus: Change dressing every day. - home health change twice a week. wound center weekly. all other days family to change. Wound Cleansing: Wound #1 Left,Lateral Calcaneus: Clean wound with Wound Cleanser - or  wash with soap and water Primary Wound Dressing: Wound #1 Left,Lateral Calcaneus: Calcium Alginate with Silver Secondary Dressing: Wound #1 Left,Lateral Calcaneus: Kerlix/Rolled Gauze Dry Gauze Heel Cup Edema Control: Elevate legs to the level of the heart or above for 30 minutes daily and/or when sitting, a frequency of: - throughout the day. Off-Loading: Other: - float heels with bunny boots or pillows while rest in bed or chair to aid in offloading pressure to heels. Home Health: [facility] for wound care. - [facility]. Still awaiting arterial studies now booked for 6/30. He has palpable popliteal pulses but not his pedal pulses. 2. Awaiting MRI result. This probes to bone. If this is positive for osteomyelitis a bone biopsy and culture is possible 3. Continue with silver alginate as the primary dressing Electronic Signature(s) Signed: 07/11/2019 5:46:39 PM By: Linton Ham MD Signed: 07/11/2019 5:46:39 PM By: Linton Ham MD Entered By: Linton Ham on 07/11/2019 15:06:33 -------------------------------------------------------------------------------- SuperBill Details Patient Name: Date of Service: MA Curtis Jenkins NA RD G. 07/11/2019 Medical Record Number: [id number] Patient Account Number: [id number] Date of Birth/Sex: Treating RN: 1935/06/06 (83 y.o. 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.3 Idiopathic progressive neuropathy Facility Procedures CPT4 Code: [id number] Description: 87681 - DEB SUBQ TISSUE 20 SQ CM/< ICD-10 Diagnosis Description L89.620 Pressure ulcer of left heel,  unstageable I70.244 Atherosclerosis of native arteries of left leg with ulceration of heel and midf Modifier: oot Quantity: 1 Physician Procedures : CPT4 Code Description Modifier [id number] - WC PHYS SUBQ TISS 20 SQ CM ICD-10 Diagnosis Description L89.620 Pressure ulcer of left heel, unstageable I70.244 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

## 2019-07-12 ENCOUNTER — Encounter (HOSPITAL_COMMUNITY): Payer: Self-pay

## 2019-07-12 ENCOUNTER — Other Ambulatory Visit (HOSPITAL_COMMUNITY)
Admission: RE | Admit: 2019-07-12 | Discharge: 2019-07-12 | Disposition: A | Discharge status: Home / Self Care | Payer: Medicare Other | Source: Ambulatory Visit | Attending: Urology | Admitting: Urology

## 2019-07-12 ENCOUNTER — Encounter (HOSPITAL_COMMUNITY)
Admission: RE | Admit: 2019-07-12 | Discharge: 2019-07-12 | Disposition: A | Discharge status: Home / Self Care | Payer: Medicare Other | Source: Ambulatory Visit | Attending: Urology | Admitting: Urology

## 2019-07-13 ENCOUNTER — Ambulatory Visit
Admission: RE | Admit: 2019-07-13 | Discharge: 2019-07-13 | Disposition: A | Discharge status: Home / Self Care | Payer: Medicare Other | Source: Ambulatory Visit | Attending: Internal Medicine | Admitting: Internal Medicine

## 2019-07-13 DIAGNOSIS — E785 Hyperlipidemia, unspecified: Secondary | ICD-10-CM | POA: Diagnosis not present

## 2019-07-13 DIAGNOSIS — R1310 Dysphagia, unspecified: Secondary | ICD-10-CM | POA: Diagnosis not present

## 2019-07-13 DIAGNOSIS — R0989 Other specified symptoms and signs involving the circulatory and respiratory systems: Secondary | ICD-10-CM | POA: Diagnosis not present

## 2019-07-13 DIAGNOSIS — I129 Hypertensive chronic kidney disease with stage 1 through stage 4 chronic kidney disease, or unspecified chronic kidney disease: Secondary | ICD-10-CM | POA: Diagnosis not present

## 2019-07-13 DIAGNOSIS — I69954 Hemiplegia and hemiparesis following unspecified cerebrovascular disease affecting left non-dominant side: Secondary | ICD-10-CM | POA: Diagnosis not present

## 2019-07-13 DIAGNOSIS — M25422 Effusion, left elbow: Secondary | ICD-10-CM | POA: Diagnosis not present

## 2019-07-13 DIAGNOSIS — N182 Chronic kidney disease, stage 2 (mild): Secondary | ICD-10-CM | POA: Diagnosis not present

## 2019-07-13 DIAGNOSIS — L89620 Pressure ulcer of left heel, unstageable: Secondary | ICD-10-CM | POA: Diagnosis not present

## 2019-07-13 DIAGNOSIS — M159 Polyosteoarthritis, unspecified: Secondary | ICD-10-CM | POA: Diagnosis not present

## 2019-07-13 DIAGNOSIS — Z720 Tobacco use: Secondary | ICD-10-CM | POA: Diagnosis not present

## 2019-07-13 DIAGNOSIS — I69391 Dysphagia following cerebral infarction: Secondary | ICD-10-CM | POA: Diagnosis not present

## 2019-07-13 DIAGNOSIS — L8990 Pressure ulcer of unspecified site, unspecified stage: Secondary | ICD-10-CM | POA: Diagnosis not present

## 2019-07-13 DIAGNOSIS — I639 Cerebral infarction, unspecified: Secondary | ICD-10-CM | POA: Diagnosis not present

## 2019-07-14 DIAGNOSIS — R1310 Dysphagia, unspecified: Secondary | ICD-10-CM | POA: Diagnosis not present

## 2019-07-14 DIAGNOSIS — E785 Hyperlipidemia, unspecified: Secondary | ICD-10-CM | POA: Diagnosis not present

## 2019-07-14 DIAGNOSIS — I129 Hypertensive chronic kidney disease with stage 1 through stage 4 chronic kidney disease, or unspecified chronic kidney disease: Secondary | ICD-10-CM | POA: Diagnosis not present

## 2019-07-14 DIAGNOSIS — I69954 Hemiplegia and hemiparesis following unspecified cerebrovascular disease affecting left non-dominant side: Secondary | ICD-10-CM | POA: Diagnosis not present

## 2019-07-14 DIAGNOSIS — M25422 Effusion, left elbow: Secondary | ICD-10-CM | POA: Diagnosis not present

## 2019-07-14 DIAGNOSIS — L89620 Pressure ulcer of left heel, unstageable: Secondary | ICD-10-CM | POA: Diagnosis not present

## 2019-07-14 DIAGNOSIS — N182 Chronic kidney disease, stage 2 (mild): Secondary | ICD-10-CM | POA: Diagnosis not present

## 2019-07-14 DIAGNOSIS — Z720 Tobacco use: Secondary | ICD-10-CM | POA: Diagnosis not present

## 2019-07-14 DIAGNOSIS — M159 Polyosteoarthritis, unspecified: Secondary | ICD-10-CM | POA: Diagnosis not present

## 2019-07-14 DIAGNOSIS — I69391 Dysphagia following cerebral infarction: Secondary | ICD-10-CM | POA: Diagnosis not present

## 2019-07-16 ENCOUNTER — Other Ambulatory Visit: Payer: Self-pay

## 2019-07-16 ENCOUNTER — Ambulatory Visit (HOSPITAL_COMMUNITY)
Admission: RE | Admit: 2019-07-16 | Discharge: 2019-07-16 | Disposition: A | Discharge status: Home / Self Care | Payer: Medicare Other | Source: Ambulatory Visit | Attending: Internal Medicine | Admitting: Internal Medicine

## 2019-07-16 DIAGNOSIS — M86172 Other acute osteomyelitis, left ankle and foot: Secondary | ICD-10-CM | POA: Diagnosis not present

## 2019-07-16 DIAGNOSIS — L89620 Pressure ulcer of left heel, unstageable: Secondary | ICD-10-CM | POA: Diagnosis not present

## 2019-07-19 ENCOUNTER — Encounter (HOSPITAL_BASED_OUTPATIENT_CLINIC_OR_DEPARTMENT_OTHER): Payer: Medicare Other | Attending: Internal Medicine | Admitting: Internal Medicine

## 2019-07-19 ENCOUNTER — Encounter (HOSPITAL_BASED_OUTPATIENT_CLINIC_OR_DEPARTMENT_OTHER): Payer: Medicare Other | Admitting: Internal Medicine

## 2019-07-19 ENCOUNTER — Other Ambulatory Visit (HOSPITAL_BASED_OUTPATIENT_CLINIC_OR_DEPARTMENT_OTHER): Payer: Self-pay | Admitting: Internal Medicine

## 2019-07-19 ENCOUNTER — Other Ambulatory Visit: Payer: Self-pay

## 2019-07-19 DIAGNOSIS — L89620 Pressure ulcer of left heel, unstageable: Secondary | ICD-10-CM | POA: Insufficient documentation

## 2019-07-19 DIAGNOSIS — I4891 Unspecified atrial fibrillation: Secondary | ICD-10-CM | POA: Diagnosis not present

## 2019-07-19 DIAGNOSIS — Z8546 Personal history of malignant neoplasm of prostate: Secondary | ICD-10-CM | POA: Insufficient documentation

## 2019-07-19 DIAGNOSIS — Z7901 Long term (current) use of anticoagulants: Secondary | ICD-10-CM | POA: Diagnosis not present

## 2019-07-19 DIAGNOSIS — Z6835 Body mass index (BMI) 35.0-35.9, adult: Secondary | ICD-10-CM | POA: Insufficient documentation

## 2019-07-19 DIAGNOSIS — M86672 Other chronic osteomyelitis, left ankle and foot: Secondary | ICD-10-CM | POA: Insufficient documentation

## 2019-07-19 DIAGNOSIS — I10 Essential (primary) hypertension: Secondary | ICD-10-CM | POA: Insufficient documentation

## 2019-07-19 DIAGNOSIS — L89322 Pressure ulcer of left buttock, stage 2: Secondary | ICD-10-CM | POA: Diagnosis not present

## 2019-07-19 DIAGNOSIS — I96 Gangrene, not elsewhere classified: Secondary | ICD-10-CM | POA: Diagnosis not present

## 2019-07-19 DIAGNOSIS — G603 Idiopathic progressive neuropathy: Secondary | ICD-10-CM | POA: Insufficient documentation

## 2019-07-19 NOTE — Progress Notes (Signed)
SAKAI, WOLFORD ([id number]) Visit Report for 07/19/2019 Arrival Information Details Patient Name: Date of Service: [name] 07/19/2019 12:30 PM Medical Record Number: [id number] Patient Account Number: [id number] Date of Birth/Sex: Treating RN: Oct 31, 1935 (84 y.o. Ulyses Amor, Vaughan Basta Primary Care Jatoya Armbrister: [employer] Other Clinician: Referring Jonny Dearden: Treating Bao Bazen/Extender: Garfield Cornea in Treatment: 3 Visit Information History Since Last Visit Added or deleted any medications: No Patient Arrived: Wheel Chair Any new allergies or adverse reactions: No Arrival Time: 12:35 Had a fall or experienced change in No Accompanied By: daughter activities of daily living that may affect Transfer Assistance: Manual risk of falls: Patient Requires Transmission-Based Precautions: No Signs or symptoms of abuse/neglect since last visito No Patient Has Alerts: Yes Hospitalized since last visit: No Patient Alerts: Left ABI: 0.77 Implantable device outside of the clinic excluding No cellular tissue based products placed in the center since last visit: Has Dressing in Place as Prescribed: Yes Pain Present Now: No Electronic Signature(s) Signed: 07/19/2019 5:06:40 PM By: Baruch Gouty RN, BSN Entered By: Baruch Gouty on 07/19/2019 12:46:06 -------------------------------------------------------------------------------- Encounter Discharge Information Details Patient Name: Date of Service: [name] NA RD G. 07/19/2019 12:30 PM Medical Record Number: [id number] Patient Account Number: [id number] Date of Birth/Sex: Treating RN: Jun 04, 1935 (84 y.o. [name] Primary Care Kazmir Oki: [employer] Other Clinician: Referring Jolanda Mccann: Treating Shataria Crist/Extender: Garfield Cornea in Treatment: 3 Encounter Discharge Information Items Post Procedure Vitals Discharge Condition: Stable Temperature (F): 99.2 Ambulatory  Status: Walker Pulse (bpm): 72 Discharge Destination: Home Respiratory Rate (breaths/min): 19 Transportation: Private Auto Blood Pressure (mmHg): 133/75 Accompanied By: daughter Schedule Follow-up Appointment: Yes Clinical Summary of Care: Patient Declined Electronic Signature(s) Signed: 07/19/2019 5:18:40 PM By: Kela Millin Entered By: Kela Millin on 07/19/2019 13:44:56 -------------------------------------------------------------------------------- Lower Extremity Assessment Details Patient Name: Date of Service: [name] NA RD G. 07/19/2019 12:30 PM Medical Record Number: [id number] Patient Account Number: [id number] Date of Birth/Sex: Treating RN: 09/17/1935 (84 y.o. [name] Primary Care Gianelle Mccaul: [employer] Other Clinician: Referring Maxi Carreras: Treating Braxson Hollingsworth/Extender: Garfield Cornea in Treatment: 3 Edema Assessment Assessed: [name]: No] Patrice Paradise: No] Edema: [Left: Ye] [Right: s] Calf Left: Right: Point of Measurement: 39 cm From Medial Instep 34.5 cm cm Ankle Left: Right: Point of Measurement: 14 cm From Medial Instep 25 cm cm Vascular Assessment Pulses: Dorsalis Pedis Palpable: [Left:Yes] Electronic Signature(s) Signed: 07/19/2019 5:06:40 PM By: Baruch Gouty RN, BSN Entered By: Baruch Gouty on 07/19/2019 12:47:33 -------------------------------------------------------------------------------- Multi Wound Chart Details Patient Name: Date of Service: [name] NA RD G. 07/19/2019 12:30 PM Medical Record Number: [id number] Patient Account Number: [id number] Date of Birth/Sex: Treating RN: Feb 05, 1935 (84 y.o. [name] Primary Care Aria Jarrard: [employer] Other Clinician: Referring Oluwaseyi Raffel: Treating Isiah Scheel/Extender: Garfield Cornea in Treatment: 3 Vital Signs Height(in): 71 Pulse(bpm): 72 Weight(lbs): 250 Blood Pressure(mmHg): 133/75 Body Mass Index(BMI):  35 Temperature(F): 99.2 Respiratory Rate(breaths/min): 19 Photos: [1:No Photos Left, Lateral Calcaneus] [2:No Photos Left Gluteus] [N/A:N/A N/A] Wound Location: [1:Blister] [2:Gradually Appeared] [N/A:N/A] Wounding Event: [1:Pressure Ulcer] [2:Pressure Ulcer] [N/A:N/A] Primary Etiology: [1:05/12/2019] [2:07/14/2019] [N/A:N/A] Date Acquired: [1:3] [2:0] [N/A:N/A] Weeks of Treatment: [1:Open] [2:Open] [N/A:N/A] Wound Status: [1:3.8x2.6x1.8] [2:1.6x1.5x0.1] [N/A:N/A] Measurements L x W x D (cm) [1:7.76] [2:1.885] [N/A:N/A] A (cm) : rea [1:13.968] [2:0.188] [N/A:N/A] Volume (cm) : [1:19.30%] [2:N/A] [N/A:N/A] % Reduction in A rea: [1:9.20%] [2:N/A] [N/A:N/A] % Reduction in Volume: [1:Unstageable/Unclassified] [2:Category/Stage II] [N/A:N/A] Classification: [1:Medium] [2:Medium] [N/A:N/A] Exudate A  mount: [1:Purulent] [2:Serosanguineous] [N/A:N/A] Exudate Type: [1:yellow, brown, green] [2:red, brown] [N/A:N/A] Exudate Color: [1:Well defined, not attached] [2:N/A] [N/A:N/A] Wound Margin: [1:Medium (34-66%)] [2:Large (67-100%)] [N/A:N/A] Granulation A mount: [1:Pink] [2:Red, Pink] [N/A:N/A] Granulation Quality: [1:Medium (34-66%)] [2:None Present (0%)] [N/A:N/A] Necrotic A mount: [1:Fat Layer (Subcutaneous Tissue)] [2:Fat Layer (Subcutaneous Tissue)] [N/A:N/A] Exposed Structures: [1:Exposed: Yes Fascia: No Tendon: No Muscle: No Joint: No Bone: No None] [2:Exposed: Yes Fascia: No Tendon: No Muscle: No Joint: No Bone: No None] [N/A:N/A] Epithelialization: [1:Debridement - Excisional] [2:N/A] [N/A:N/A] Debridement: Pre-procedure Verification/Time Out 13:20 [2:N/A] [N/A:N/A] Taken: [1:Bone] [2:N/A] [N/A:N/A] Tissue Debrided: [1:Skin/Subcutaneous] [2:N/A] [N/A:N/A] Level: [1:Tissue/Muscle/Bone 1] [2:N/A] [N/A:N/A] Debridement A (sq cm): [1:rea Rongeur] [2:N/A] [N/A:N/A] Instrument: [1:Swab] [2:N/A] [N/A:N/A] Specimen: [1:1] [2:N/A] [N/A:N/A] Number of Specimens Taken: [1:Moderate] [2:N/A]  [N/A:N/A] Bleeding: [1:Silver Nitrate] [2:N/A] [N/A:N/A] Hemostasis Achieved: [1:0] [2:N/A] [N/A:N/A] Procedural Pain: [1:0] [2:N/A] [N/A:N/A] Post Procedural Pain: Debridement Treatment Response: Procedure was tolerated well [2:N/A] [N/A:N/A] Post Debridement Measurements L x 3.8x2.6x1.8 [2:N/A] [N/A:N/A] W x D (cm) [1:13.968] [2:N/A] [N/A:N/A] Post Debridement Volume: (cm) [1:Unstageable/Unclassified] [2:N/A] [N/A:N/A] Post Debridement Stage: [1:Debridement] [2:N/A] [N/A:N/A] Treatment Notes Electronic Signature(s) Signed: 07/19/2019 5:09:59 PM By: Linton Ham MD Signed: 07/19/2019 5:26:11 PM By: Levan Hurst RN, BSN Entered By: Linton Ham on 07/19/2019 13:23:03 -------------------------------------------------------------------------------- Multi-Disciplinary Care Plan Details Patient Name: Date of Service: [name] NA RD G. 07/19/2019 12:30 PM Medical Record Number: [id number] Patient Account Number: [id number] Date of Birth/Sex: Treating RN: 03-Aug-1935 (84 y.o. [name] Primary Care Annagrace Carr: [employer] Other Clinician: Referring Akiva Brassfield: Treating Abie Cheek/Extender: Garfield Cornea in Treatment: 3 Active Inactive Wound/Skin Impairment Nursing Diagnoses: Knowledge deficit related to ulceration/compromised skin integrity Goals: Patient/caregiver will verbalize understanding of skin care regimen Date Initiated: 06/23/2019 Target Resolution Date: 08/19/2019 Goal Status: Active Interventions: Assess patient/caregiver ability to obtain necessary supplies Assess patient/caregiver ability to perform ulcer/skin care regimen upon admission and as needed Provide education on ulcer and skin care Treatment Activities: Skin care regimen initiated : 06/23/2019 Topical wound management initiated : 06/23/2019 Notes: Electronic Signature(s) Signed: 07/19/2019 5:26:11 PM By: Levan Hurst RN, BSN Entered By: Levan Hurst on 07/19/2019  13:47:13 -------------------------------------------------------------------------------- Pain Assessment Details Patient Name: Date of Service: [name] NA RD G. 07/19/2019 12:30 PM Medical Record Number: [id number] Patient Account Number: [id number] Date of Birth/Sex: Treating RN: [birth year] (84 y.o. [name] Primary Care Chandell Attridge: [employer] Other Clinician: Referring Lizzeth Meder: Treating Viyaan Champine/Extender: Garfield Cornea in Treatment: 3 Active Problems Location of Pain Severity and Description of Pain Patient Has Paino No Site Locations Pain Management and Medication Current Pain Management: Electronic Signature(s) Signed: 07/19/2019 5:06:40 PM By: Baruch Gouty RN, BSN Entered By: Baruch Gouty on 07/19/2019 12:46:42 -------------------------------------------------------------------------------- Patient/Caregiver Education Details Patient Name: Date of Service: [name] NA RD G. 7/6/2021andnbsp12:30 PM Medical Record Number: [id number] Patient Account Number: [id number] Date of Birth/Gender: Treating RN: 02/27/1935 (84 y.o. [name] Primary Care Physician: [employer] Other Clinician: Referring Physician: Treating Physician/Extender: Garfield Cornea in Treatment: 3 Education Assessment Education Provided To: Patient Education Topics Provided Wound/Skin Impairment: Methods: Explain/Verbal Responses: State content correctly Electronic Signature(s) Signed: 07/19/2019 5:26:11 PM By: Levan Hurst RN, BSN Entered By: Levan Hurst on 07/19/2019 13:47:31 -------------------------------------------------------------------------------- Wound Assessment Details Patient Name: Date of Service: [name] NA RD G. 07/19/2019 12:30 PM Medical Record Number: [id number] Patient Account Number: [id number] Date of Birth/Sex: Treating RN: [birth year] (84 y.o. [name] Primary Care Eternity Dexter:  [employer] Other Clinician:  Referring Gretta Samons: Treating Rudene Poulsen/Extender: Garfield Cornea in Treatment: 3 Wound Status Wound Number: 1 Primary Etiology: Pressure Ulcer Wound Location: Left, Lateral Calcaneus Wound Status: Open Wounding Event: Blister Date Acquired: 05/12/2019 Weeks Of Treatment: 3 Clustered Wound: No Wound Measurements Length: (cm) 3.8 Width: (cm) 2.6 Depth: (cm) 1.8 Area: (cm) 7.76 Volume: (cm) 13.968 % Reduction in Area: 19.3% % Reduction in Volume: 9.2% Epithelialization: None Tunneling: No Undermining: No Wound Description Classification: Unstageable/Unclassified Wound Margin: Well defined, not attached Exudate Amount: Medium Exudate Type: Purulent Exudate Color: yellow, brown, green Foul Odor After Cleansing: No Slough/Fibrino Yes Wound Bed Granulation Amount: Medium (34-66%) Exposed Structure Granulation Quality: Pink Fascia Exposed: No Necrotic Amount: Medium (34-66%) Fat Layer (Subcutaneous Tissue) Exposed: Yes Necrotic Quality: Adherent Slough Tendon Exposed: No Muscle Exposed: No Joint Exposed: No Bone Exposed: No Treatment Notes Wound #1 (Left, Lateral Calcaneus) 1. Cleanse With Wound Cleanser 2. Periwound Care Skin Prep 3. Primary Dressing Applied Calcium Alginate Ag 4. Secondary Dressing Roll Gauze Heel Cup Foam Border Dressing Notes foam to gluteus and heel cup/kerlix to heel Electronic Signature(s) Signed: 07/19/2019 5:06:40 PM By: Baruch Gouty RN, BSN Entered By: Baruch Gouty on 07/19/2019 12:49:42 -------------------------------------------------------------------------------- Wound Assessment Details Patient Name: Date of Service: [name] NA RD G. 07/19/2019 12:30 PM Medical Record Number: [id number] Patient Account Number: [id number] Date of Birth/Sex: Treating RN: [birth year] (84 y.o. [name] Primary Care Shakevia Sarris: [employer] Other Clinician: Referring  Aracelia Brinson: Treating Anthonio Mizzell/Extender: Garfield Cornea in Treatment: 3 Wound Status Wound Number: 2 Primary Etiology: Pressure Ulcer Wound Location: Left Gluteus Wound Status: Open Wounding Event: Gradually Appeared Date Acquired: 07/14/2019 Weeks Of Treatment: 0 Clustered Wound: No Wound Measurements Length: (cm) 1.6 Width: (cm) 1.5 Depth: (cm) 0.1 Area: (cm) 1.885 Volume: (cm) 0.188 % Reduction in Area: % Reduction in Volume: Epithelialization: None Tunneling: No Undermining: No Wound Description Classification: Category/Stage II Exudate Amount: Medium Exudate Type: Serosanguineous Exudate Color: red, brown Foul Odor After Cleansing: No Slough/Fibrino No Wound Bed Granulation Amount: Large (67-100%) Exposed Structure Granulation Quality: Red, Pink Fascia Exposed: No Necrotic Amount: None Present (0%) Fat Layer (Subcutaneous Tissue) Exposed: Yes Tendon Exposed: No Muscle Exposed: No Joint Exposed: No Bone Exposed: No Treatment Notes Wound #2 (Left Gluteus) 1. Cleanse With Wound Cleanser 2. Periwound Care Skin Prep 3. Primary Dressing Applied Calcium Alginate Ag 4. Secondary Dressing Roll Gauze Heel Cup Foam Border Dressing Notes foam to gluteus and heel cup/kerlix to heel Electronic Signature(s) Signed: 07/19/2019 5:18:40 PM By: Kela Millin Entered By: Kela Millin on 07/19/2019 12:52:47 -------------------------------------------------------------------------------- Vitals Details Patient Name: Date of Service: [name] NA RD G. 07/19/2019 12:30 PM Medical Record Number: [id number] Patient Account Number: [id number] Date of Birth/Sex: Treating RN: 10-30-1935 (84 y.o. [name] Primary Care Haylen Bellotti: [employer] Other Clinician: Referring Jahdai Padovano: Treating Guadalupe Nickless/Extender: Garfield Cornea in Treatment: 3 Vital Signs Time Taken: 12:45 Temperature (F): 99.2 Height (in):  71 Pulse (bpm): 72 Weight (lbs): 250 Respiratory Rate (breaths/min): 19 Body Mass Index (BMI): 34.9 Blood Pressure (mmHg): 133/75 Reference Range: 80 - 120 mg / dl Electronic Signature(s) Signed: 07/19/2019 5:06:40 PM By: Baruch Gouty RN, BSN Entered By: Baruch Gouty on 07/19/2019 12:46:35

## 2019-07-19 NOTE — Progress Notes (Signed)
Curtis Jenkins, Curtis Jenkins ([id number]) Visit Report for 07/19/2019 Debridement Details Patient Name: Date of Service: [name] 07/19/2019 12:30 PM Medical Record Number: [id number] Patient Account Number: [id number] Date of Birth/Sex: Treating RN: 06-26-1935 (84 y.o. [name] Primary Care Provider: [employer] Other Clinician: Referring Provider: Treating Provider/Extender: Garfield Cornea in Treatment: 3 Debridement Performed for Assessment: Wound #1 Left,Lateral Calcaneus Performed By: Physician Ricard Dillon., MD Debridement Type: Debridement Level of Consciousness (Pre-procedure): Awake and Alert Pre-procedure Verification/Time Out Yes - 13:20 Taken: Start Time: 13:20 T Area Debrided (L x W): otal 1 (cm) x 1 (cm) = 1 (cm) Tissue and other material debrided: Non-Viable, Bone Level: Skin/Subcutaneous Tissue/Muscle/Bone Debridement Description: Excisional Instrument: Rongeur Specimen: Swab, Number of Specimens T aken: 1 Bleeding: Moderate Hemostasis Achieved: Silver Nitrate End Time: 13:20 Procedural Pain: 0 Post Procedural Pain: 0 Response to Treatment: Procedure was tolerated well Level of Consciousness (Post- Awake and Alert procedure): Post Debridement Measurements of Total Wound Length: (cm) 3.8 Stage: Unstageable/Unclassified Width: (cm) 2.6 Depth: (cm) 1.8 Volume: (cm) 13.968 Character of Wound/Ulcer Post Debridement: Improved Post Procedure Diagnosis Same as Pre-procedure Electronic Signature(s) Signed: 07/19/2019 5:09:59 PM By: Linton Ham MD Signed: 07/19/2019 5:26:11 PM By: Levan Hurst RN, BSN Entered By: Linton Ham on 07/19/2019 13:23:21 -------------------------------------------------------------------------------- HPI Details Patient Name: Date of Service: Curtis Jenkins NA RD G. 07/19/2019 12:30 PM Medical Record Number: [id number] Patient Account Number: [id number] Date of Birth/Sex: Treating  RN: [birth year] (84 y.o. [name] Primary Care Provider: [employer] Other Clinician: Referring Provider: Treating Provider/Extender: Garfield Cornea in Treatment: 3 History of Present Illness HPI Description: ADMISSION 06/23/19 This is an 84 year old man who lives in [place]. He was admitted to hospital in March with altered functional level. 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 He came home with physical therapy at some point he was discovered that he had pressure ulcer on the left heel which started as a blister. He has had [facility] health [[facility] going out to change dressings. They have been using Medihoney. The patient is minimally ambulatory only for transfers. They have been offloading the heel by using pillows etc. He spends most of his day in bed or in a recliner. 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 His daughter lives next door and there are other family members close by. ABI in our clinic was 0.77 on the left 06/28/2019; patient I admitted to the clinic last week who has a deep pressure ulcer on the left heel. Culture I did of this area showed Enterococcus and staph epidermidis. Empirically put him on Augmentin last week which will continue for another week. His x-ray suggested possible calcaneal erosion. He will also require an MRI. We are using silver alginate. Arterial studies are on 6/22 6/21; this is a patient with a deep pressure ulcer on the left heel. He has his arterial studies tomorrow however currently his MRI is only booked for 7/14 at [facility]. This will not suffice. He is still on Augmentin that I started him on last week. Culture of the area superficially I  did showed Enterococcus. 6/28; arterial studies got delayed until 6/30. MRI is for 7/3. He has completed Augmentin. We are using silver alginate in the wound 7/6;  arterial studies actually were surprisingly good. On the left his ABI was 1 TBI is slightly reduced at 0.5 biphasic waveforms. Unfortunately his MRI documents osteomyelitis deep to the wound. Negative for abscess or septic joint. We have been using silver alginate. Electronic Signature(s) Signed: 07/19/2019 5:09:59 PM By: Linton Ham MD Entered By: Linton Ham on 07/19/2019 13:24:27 -------------------------------------------------------------------------------- Physical Exam Details Patient Name: Date of Service: Curtis Jenkins NA RD G. 07/19/2019 12:30 PM Medical Record Number: [id number] Patient Account Number: [id number] Date of Birth/Sex: Treating RN: 1936/01/08 (84 y.o. [name] Primary Care Provider: [employer] Other Clinician: Referring Provider: Treating Provider/Extender: Garfield Cornea in Treatment: 3 Constitutional Sitting or standing Blood Pressure is within target range for patient.. Pulse regular and within target range for patient.Marland Kitchen Respirations regular, non-labored and within target range.. Temperature is normal and within the target range for the patient.Marland Kitchen Appears in no distress. Notes Wound exam; left lateral calcaneus. Open to bone. I used rongeurs to remove a specimen of bone for pathology hopefully obtained and CandS. Hemostasis with silver nitrate. No evidence of soft tissue infection Electronic Signature(s) Signed: 07/19/2019 5:09:59 PM By: Linton Ham MD Entered By: Linton Ham on 07/19/2019 13:25:36 -------------------------------------------------------------------------------- Physician Orders Details Patient Name: Date of Service: [name] NA RD G. 07/19/2019 12:30 PM Medical Record Number: [id number] Patient Account Number: [id number] Date of  Birth/Sex: Treating RN: October 18, 1935 (84 y.o. 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 Dressing Change Frequency Wound #1 Left,Lateral Calcaneus Change dressing every day. - home health change twice a week. wound center weekly. all other days family to change. Wound #2 Left Gluteus Change dressing every day. - home health change twice a week. wound center weekly. all other days family to change. Wound Cleansing Clean wound with Wound Cleanser - or wash with soap and water Primary Wound Dressing Wound #1 Left,Lateral Calcaneus Calcium Alginate with Silver Wound #2 Left Gluteus Calcium Alginate with Silver Secondary Dressing Wound #1 Left,Lateral Calcaneus Kerlix/Rolled Gauze Dry Gauze Heel Cup Wound #2 Left Gluteus Foam Border Edema Control Elevate legs to the level of the heart or above for 30 minutes daily and/or when sitting, a frequency of: - throughout the day Off-Loading Turn and reposition every 2 hours Other: - float heels with bunny boots or pillows while rest in bed or chair to aid in offloading pressure to heels. [facility] skilled nursing for wound care. - [facility] Consults Infectious Disease - Osteomyelitis left heel - (ICD10 L89.620 - Pressure ulcer of left heel, unstageable) Laboratory naerobe culture (MICRO) - Bone left heel - (ICD10 L89.620 - Pressure ulcer of left heel, Bacteria identified in Unspecified specimen by A unstageable) LOINC Code: 993-5 Convenience  Name: Anerobic culture Bacteria identified in Tissue by Biopsy culture (MICRO) - Bone left heel - (ICD10 L89.620 - Pressure ulcer of left heel, unstageable) LOINC Code: 70177-9 Convenience Name: Biopsy specimen culture Electronic Signature(s) Signed: 07/19/2019 5:09:59 PM By: Linton Ham MD Signed: 07/19/2019 5:26:11 PM By: Levan Hurst RN, BSN Entered By: Levan Hurst on 07/19/2019 13:19:48 Prescription 07/19/2019 -------------------------------------------------------------------------------- Marcelino Scot Linton Ham MD Patient Name:  Provider: 01/07/1936 [id number] Date of Birth: NPI#: Jerilynn Mages [id number] Sex: DEA #: [phone] [id number] Phone #: License #: [place] Patient Address: [address] [place], [place] [address] [place], [place] [address] [phone] Allergies Name Reaction Severity No Known Allergies Provider's Orders Infectious Disease - ICD10: L89.620 - Osteomyelitis left heel Hand Signature: Date(s): Electronic Signature(s) Signed: 07/19/2019 5:09:59 PM By: Linton Ham MD Signed: 07/19/2019 5:26:11 PM By: Levan Hurst RN, BSN Entered By: Levan Hurst on 07/19/2019 13:19:49 -------------------------------------------------------------------------------- Problem List Details Patient Name: Date of Service: [name] NA RD G. 07/19/2019 12:30 PM Medical Record Number: [id number] Patient Account Number: [id number] Date of Birth/Sex: Treating RN: [birth year] (84 y.o. 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 06/23/2019 No Yes O11.572 Other chronic osteomyelitis, left  ankle and foot 07/19/2019 No Yes L89.322 Pressure ulcer of left buttock, stage 2 07/19/2019 No Yes Inactive Problems ICD-10 Code Description Active Date Inactive Date L03.116 Cellulitis of left lower limb 06/23/2019 06/23/2019 I70.244 Atherosclerosis of native arteries of left leg with ulceration of heel and midfoot 06/23/2019 06/23/2019 Resolved Problems Electronic Signature(s) Signed: 07/19/2019 5:09:59 PM By: Linton Ham MD Entered By: Linton Ham on 07/19/2019 13:22:52 -------------------------------------------------------------------------------- Progress Note Details Patient Name: Date of Service: Curtis Jenkins NA RD G. 07/19/2019 12:30 PM Medical Record Number: [id number] Patient Account Number: [id number] Date of Birth/Sex: Treating RN: 08/14/1935 (84 y.o. [name] Primary Care Provider: [employer] Other Clinician: Referring Provider: Treating Provider/Extender: Garfield Cornea in Treatment: 3 Subjective History of Present Illness (HPI) ADMISSION 06/23/19 This is an 84 year old man who lives in [place]. He was admitted to hospital in March with altered functional level. 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 He came home with physical therapy at some point he was discovered that he had pressure ulcer on the left heel which started as a blister. He has had home health [[facility] going out to change dressings. They have been using Medihoney. The patient is minimally ambulatory only for transfers. They have been offloading the heel by using pillows etc. He spends most of his day in bed or in a recliner. 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 His daughter lives next door and  there are other family members close by. ABI in our clinic was 0.77 on the left 06/28/2019; patient I admitted to the clinic last week who has a deep pressure ulcer on the left heel. Culture I did of this area showed Enterococcus and staph epidermidis. Empirically put him on Augmentin last week which will continue for another week. His x-ray suggested possible calcaneal erosion. He will also require an MRI. We are using silver alginate. Arterial studies are on 6/22 6/21; this is a patient with a deep pressure ulcer on the left heel. He has his arterial studies tomorrow however currently his MRI is only booked for 7/14 at [facility]. This will not suffice. He is still on Augmentin that I started him on last week. Culture of the area superficially I  did showed Enterococcus. 6/28; arterial studies got delayed until 6/30. MRI is for 7/3. He has completed Augmentin. We are using silver alginate in the wound 7/6; arterial studies actually were surprisingly good. On the left his ABI was 1 TBI is slightly reduced at 0.5 biphasic waveforms. Unfortunately his MRI documents osteomyelitis deep to the wound. Negative for abscess or septic joint. We have been using silver alginate. Objective Constitutional Sitting or standing Blood Pressure is within target range for patient.. Pulse regular and within target range for patient.Marland Kitchen Respirations regular, non-labored and within target range.. Temperature is normal and within the target range for the patient.Marland Kitchen Appears in no distress. Vitals Time Taken: 12:45 PM, Height: 71 in, Weight: 250 lbs, BMI: 34.9, Temperature: 99.2 F, Pulse: 72 bpm, Respiratory Rate: 19 breaths/min, Blood Pressure: 133/75 mmHg. General Notes: Wound exam; left lateral calcaneus. Open to bone. I used rongeurs to remove a specimen of bone for pathology hopefully obtained and CandS. Hemostasis with silver nitrate. No evidence of soft tissue infection Integumentary (Hair, Skin) Wound #1 status is  Open. Original cause of wound was Blister. The wound is located on the Left,Lateral Calcaneus. The wound measures 3.8cm length x 2.6cm width x 1.8cm depth; 7.76cm^2 area and 13.968cm^3 volume. There is Fat Layer (Subcutaneous Tissue) Exposed exposed. There is no tunneling or undermining noted. There is a medium amount of purulent drainage noted. The wound margin is well defined and not attached to the wound base. There is medium (34-66%) pink granulation within the wound bed. There is a medium (34-66%) amount of necrotic tissue within the wound bed including Adherent Slough. Wound #2 status is Open. Original cause of wound was Gradually Appeared. The wound is located on the Left Gluteus. The wound measures 1.6cm length x 1.5cm width x 0.1cm depth; 1.885cm^2 area and 0.188cm^3 volume. There is Fat Layer (Subcutaneous Tissue) Exposed exposed. There is no tunneling or undermining noted. There is a medium amount of serosanguineous drainage noted. There is large (67-100%) red, pink granulation within the wound bed. There is no necrotic tissue within the wound bed. Assessment Active Problems ICD-10 Pressure ulcer of left heel, unstageable Idiopathic progressive neuropathy Other chronic osteomyelitis, left ankle and foot Pressure ulcer of left buttock, stage 2 Procedures Wound #1 Pre-procedure diagnosis of Wound #1 is a Pressure Ulcer located on the Left,Lateral Calcaneus . There was a Excisional Skin/Subcutaneous Tissue/Muscle/Bone Debridement with a total area of 1 sq cm performed by Ricard Dillon., MD. With the following instrument(s): Rongeur to remove Non-Viable tissue/material. Material removed includes Bone. 1 specimen was taken by a Swab and sent to the lab per facility protocol. A time out was conducted at 13:20, prior to the start of the procedure. A Moderate amount of bleeding was controlled with Silver Nitrate. The procedure was tolerated well with a pain level of 0 throughout and a  pain level of 0 following the procedure. Post Debridement Measurements: 3.8cm length x 2.6cm width x 1.8cm depth; 13.968cm^3 volume. Post debridement Stage noted as Unstageable/Unclassified. Character of Wound/Ulcer Post Debridement is improved. Post procedure Diagnosis Wound #1: Same as Pre-Procedure Plan Follow-up Appointments: Return Appointment in 1 week. Dressing Change Frequency: Wound #1 Left,Lateral Calcaneus: Change dressing every day. - home health change twice a week. wound center weekly. all other days family to change. Wound #2 Left Gluteus: Change dressing every day. - home health change twice a week. wound center weekly. all other days family to change. Wound Cleansing: Clean wound with Wound Cleanser - or wash with  soap and water Primary Wound Dressing: Wound #1 Left,Lateral Calcaneus: Calcium Alginate with Silver Wound #2 Left Gluteus: Calcium Alginate with Silver Secondary Dressing: Wound #1 Left,Lateral Calcaneus: Kerlix/Rolled Gauze Dry Gauze Heel Cup Wound #2 Left Gluteus: Foam Border Edema Control: Elevate legs to the level of the heart or above for 30 minutes daily and/or when sitting, a frequency of: - throughout the day Off-Loading: Turn and reposition every 2 hours Other: - float heels with bunny boots or pillows while rest in bed or chair to aid in offloading pressure to heels. Home Health: [facility] skilled nursing for wound care. - [facility] Laboratory ordered were: Anerobic culture - Bone left heel, Biopsy specimen culture - Bone left heel Consults ordered were: Infectious Disease - Osteomyelitis left heel 1. Continue with silver alginate as the primary dressing 2. Silver alginate also to the left buttock wound 3. Frail immobile man unfortunately offloading these areas is difficult. They have a level 3 pressure relief surface on his bed.Marland Kitchen He sits most of the day however in a recliner 4. He will need to see infectious disease. Specimens  of bone for culture and pathology 5. Fortunately his degree of PAD in the left leg was minimal if at all. They did note the asymmetry in his brachial pressures. I have not looked into this Electronic Signature(s) Signed: 07/19/2019 5:09:59 PM By: Linton Ham MD Entered By: Linton Ham on 07/19/2019 13:28:12 -------------------------------------------------------------------------------- SuperBill Details Patient Name: Date of Service: Curtis Jenkins NA RD G. 07/19/2019 Medical Record Number: [id number] Patient Account Number: [id number] Date of Birth/Sex: Treating RN: 01-Apr-1935 (84 y.o. 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 M86.672 Other chronic osteomyelitis, left ankle and foot L89.322 Pressure ulcer of left buttock, stage 2 Facility Procedures CPT4 Code: [id number] Description: 02111 - DEB BONE 20 SQ CM/< ICD-10 Diagnosis Description L89.620 Pressure ulcer of left heel, unstageable M86.672 Other chronic osteomyelitis, left ankle and foot Modifier: Quantity: 1 Physician Procedures CPT4: Description Modifier Code [id number] Debridement; bone (includes epidermis, dermis, subQ tissue, muscle and/or fascia, if performed) 1st 20 sqcm or less ICD-10 Diagnosis Description L89.620 Pressure ulcer of left heel, unstageable M33.612 Other  chronic osteomyelitis, left ankle and foot Quantity: 1 Electronic Signature(s) Signed: 07/19/2019 5:09:59 PM By: Linton Ham MD Entered By: Linton Ham on 07/19/2019 13:27:09

## 2019-07-20 DIAGNOSIS — L89620 Pressure ulcer of left heel, unstageable: Secondary | ICD-10-CM | POA: Diagnosis not present

## 2019-07-20 DIAGNOSIS — I69391 Dysphagia following cerebral infarction: Secondary | ICD-10-CM | POA: Diagnosis not present

## 2019-07-20 DIAGNOSIS — R1310 Dysphagia, unspecified: Secondary | ICD-10-CM | POA: Diagnosis not present

## 2019-07-20 DIAGNOSIS — N182 Chronic kidney disease, stage 2 (mild): Secondary | ICD-10-CM | POA: Diagnosis not present

## 2019-07-20 DIAGNOSIS — Z720 Tobacco use: Secondary | ICD-10-CM | POA: Diagnosis not present

## 2019-07-20 DIAGNOSIS — I69954 Hemiplegia and hemiparesis following unspecified cerebrovascular disease affecting left non-dominant side: Secondary | ICD-10-CM | POA: Diagnosis not present

## 2019-07-20 DIAGNOSIS — E785 Hyperlipidemia, unspecified: Secondary | ICD-10-CM | POA: Diagnosis not present

## 2019-07-20 DIAGNOSIS — M25422 Effusion, left elbow: Secondary | ICD-10-CM | POA: Diagnosis not present

## 2019-07-20 DIAGNOSIS — M159 Polyosteoarthritis, unspecified: Secondary | ICD-10-CM | POA: Diagnosis not present

## 2019-07-20 DIAGNOSIS — I129 Hypertensive chronic kidney disease with stage 1 through stage 4 chronic kidney disease, or unspecified chronic kidney disease: Secondary | ICD-10-CM | POA: Diagnosis not present

## 2019-07-21 ENCOUNTER — Ambulatory Visit (INDEPENDENT_AMBULATORY_CARE_PROVIDER_SITE_OTHER): Payer: Medicare Other | Admitting: *Deleted

## 2019-07-21 DIAGNOSIS — R1310 Dysphagia, unspecified: Secondary | ICD-10-CM | POA: Diagnosis not present

## 2019-07-21 DIAGNOSIS — Z720 Tobacco use: Secondary | ICD-10-CM | POA: Diagnosis not present

## 2019-07-21 DIAGNOSIS — I129 Hypertensive chronic kidney disease with stage 1 through stage 4 chronic kidney disease, or unspecified chronic kidney disease: Secondary | ICD-10-CM | POA: Diagnosis not present

## 2019-07-21 DIAGNOSIS — I69954 Hemiplegia and hemiparesis following unspecified cerebrovascular disease affecting left non-dominant side: Secondary | ICD-10-CM | POA: Diagnosis not present

## 2019-07-21 DIAGNOSIS — M159 Polyosteoarthritis, unspecified: Secondary | ICD-10-CM | POA: Diagnosis not present

## 2019-07-21 DIAGNOSIS — N182 Chronic kidney disease, stage 2 (mild): Secondary | ICD-10-CM | POA: Diagnosis not present

## 2019-07-21 DIAGNOSIS — M25422 Effusion, left elbow: Secondary | ICD-10-CM | POA: Diagnosis not present

## 2019-07-21 DIAGNOSIS — I69391 Dysphagia following cerebral infarction: Secondary | ICD-10-CM | POA: Diagnosis not present

## 2019-07-21 DIAGNOSIS — L89620 Pressure ulcer of left heel, unstageable: Secondary | ICD-10-CM | POA: Diagnosis not present

## 2019-07-21 DIAGNOSIS — I63531 Cerebral infarction due to unspecified occlusion or stenosis of right posterior cerebral artery: Secondary | ICD-10-CM

## 2019-07-21 DIAGNOSIS — E785 Hyperlipidemia, unspecified: Secondary | ICD-10-CM | POA: Diagnosis not present

## 2019-07-21 LAB — CUP PACEART REMOTE DEVICE CHECK
Date Time Interrogation Session: 20210707230407
Implantable Pulse Generator Implant Date: 20210330

## 2019-07-22 DIAGNOSIS — I69391 Dysphagia following cerebral infarction: Secondary | ICD-10-CM | POA: Diagnosis not present

## 2019-07-22 DIAGNOSIS — R1310 Dysphagia, unspecified: Secondary | ICD-10-CM | POA: Diagnosis not present

## 2019-07-22 DIAGNOSIS — I69954 Hemiplegia and hemiparesis following unspecified cerebrovascular disease affecting left non-dominant side: Secondary | ICD-10-CM | POA: Diagnosis not present

## 2019-07-22 DIAGNOSIS — N182 Chronic kidney disease, stage 2 (mild): Secondary | ICD-10-CM | POA: Diagnosis not present

## 2019-07-22 DIAGNOSIS — M25422 Effusion, left elbow: Secondary | ICD-10-CM | POA: Diagnosis not present

## 2019-07-22 DIAGNOSIS — L89620 Pressure ulcer of left heel, unstageable: Secondary | ICD-10-CM | POA: Diagnosis not present

## 2019-07-22 DIAGNOSIS — M159 Polyosteoarthritis, unspecified: Secondary | ICD-10-CM | POA: Diagnosis not present

## 2019-07-22 DIAGNOSIS — E785 Hyperlipidemia, unspecified: Secondary | ICD-10-CM | POA: Diagnosis not present

## 2019-07-22 DIAGNOSIS — Z720 Tobacco use: Secondary | ICD-10-CM | POA: Diagnosis not present

## 2019-07-22 DIAGNOSIS — I129 Hypertensive chronic kidney disease with stage 1 through stage 4 chronic kidney disease, or unspecified chronic kidney disease: Secondary | ICD-10-CM | POA: Diagnosis not present

## 2019-07-24 LAB — AEROBIC/ANAEROBIC CULTURE W GRAM STAIN (SURGICAL/DEEP WOUND)

## 2019-07-25 DIAGNOSIS — M25422 Effusion, left elbow: Secondary | ICD-10-CM | POA: Diagnosis not present

## 2019-07-25 DIAGNOSIS — E785 Hyperlipidemia, unspecified: Secondary | ICD-10-CM | POA: Diagnosis not present

## 2019-07-25 DIAGNOSIS — Z720 Tobacco use: Secondary | ICD-10-CM | POA: Diagnosis not present

## 2019-07-25 DIAGNOSIS — M159 Polyosteoarthritis, unspecified: Secondary | ICD-10-CM | POA: Diagnosis not present

## 2019-07-25 DIAGNOSIS — N182 Chronic kidney disease, stage 2 (mild): Secondary | ICD-10-CM | POA: Diagnosis not present

## 2019-07-25 DIAGNOSIS — R1310 Dysphagia, unspecified: Secondary | ICD-10-CM | POA: Diagnosis not present

## 2019-07-25 DIAGNOSIS — I69391 Dysphagia following cerebral infarction: Secondary | ICD-10-CM | POA: Diagnosis not present

## 2019-07-25 DIAGNOSIS — L89620 Pressure ulcer of left heel, unstageable: Secondary | ICD-10-CM | POA: Diagnosis not present

## 2019-07-25 DIAGNOSIS — I129 Hypertensive chronic kidney disease with stage 1 through stage 4 chronic kidney disease, or unspecified chronic kidney disease: Secondary | ICD-10-CM | POA: Diagnosis not present

## 2019-07-25 DIAGNOSIS — I69954 Hemiplegia and hemiparesis following unspecified cerebrovascular disease affecting left non-dominant side: Secondary | ICD-10-CM | POA: Diagnosis not present

## 2019-07-25 NOTE — Progress Notes (Signed)
[facility] Summary Report / Loop Recorder 

## 2019-07-27 ENCOUNTER — Ambulatory Visit (HOSPITAL_COMMUNITY): Payer: Medicare Other

## 2019-07-28 ENCOUNTER — Other Ambulatory Visit: Payer: Self-pay

## 2019-07-28 ENCOUNTER — Encounter (HOSPITAL_BASED_OUTPATIENT_CLINIC_OR_DEPARTMENT_OTHER): Payer: Medicare Other | Admitting: Internal Medicine

## 2019-07-28 DIAGNOSIS — G603 Idiopathic progressive neuropathy: Secondary | ICD-10-CM | POA: Diagnosis not present

## 2019-07-28 DIAGNOSIS — Z7901 Long term (current) use of anticoagulants: Secondary | ICD-10-CM | POA: Diagnosis not present

## 2019-07-28 DIAGNOSIS — I10 Essential (primary) hypertension: Secondary | ICD-10-CM | POA: Diagnosis not present

## 2019-07-28 DIAGNOSIS — M86672 Other chronic osteomyelitis, left ankle and foot: Secondary | ICD-10-CM | POA: Diagnosis not present

## 2019-07-28 DIAGNOSIS — L89322 Pressure ulcer of left buttock, stage 2: Secondary | ICD-10-CM | POA: Diagnosis not present

## 2019-07-28 DIAGNOSIS — L89620 Pressure ulcer of left heel, unstageable: Secondary | ICD-10-CM | POA: Diagnosis not present

## 2019-07-28 DIAGNOSIS — I4891 Unspecified atrial fibrillation: Secondary | ICD-10-CM | POA: Diagnosis not present

## 2019-07-29 DIAGNOSIS — M25422 Effusion, left elbow: Secondary | ICD-10-CM | POA: Diagnosis not present

## 2019-07-29 DIAGNOSIS — I69391 Dysphagia following cerebral infarction: Secondary | ICD-10-CM | POA: Diagnosis not present

## 2019-07-29 DIAGNOSIS — R1310 Dysphagia, unspecified: Secondary | ICD-10-CM | POA: Diagnosis not present

## 2019-07-29 DIAGNOSIS — L89620 Pressure ulcer of left heel, unstageable: Secondary | ICD-10-CM | POA: Diagnosis not present

## 2019-07-29 DIAGNOSIS — E785 Hyperlipidemia, unspecified: Secondary | ICD-10-CM | POA: Diagnosis not present

## 2019-07-29 DIAGNOSIS — I69954 Hemiplegia and hemiparesis following unspecified cerebrovascular disease affecting left non-dominant side: Secondary | ICD-10-CM | POA: Diagnosis not present

## 2019-07-29 DIAGNOSIS — I129 Hypertensive chronic kidney disease with stage 1 through stage 4 chronic kidney disease, or unspecified chronic kidney disease: Secondary | ICD-10-CM | POA: Diagnosis not present

## 2019-07-29 DIAGNOSIS — M159 Polyosteoarthritis, unspecified: Secondary | ICD-10-CM | POA: Diagnosis not present

## 2019-07-29 DIAGNOSIS — N182 Chronic kidney disease, stage 2 (mild): Secondary | ICD-10-CM | POA: Diagnosis not present

## 2019-07-29 DIAGNOSIS — Z720 Tobacco use: Secondary | ICD-10-CM | POA: Diagnosis not present

## 2019-08-01 DIAGNOSIS — I69954 Hemiplegia and hemiparesis following unspecified cerebrovascular disease affecting left non-dominant side: Secondary | ICD-10-CM | POA: Diagnosis not present

## 2019-08-01 DIAGNOSIS — N182 Chronic kidney disease, stage 2 (mild): Secondary | ICD-10-CM | POA: Diagnosis not present

## 2019-08-01 DIAGNOSIS — I69391 Dysphagia following cerebral infarction: Secondary | ICD-10-CM | POA: Diagnosis not present

## 2019-08-01 DIAGNOSIS — M159 Polyosteoarthritis, unspecified: Secondary | ICD-10-CM | POA: Diagnosis not present

## 2019-08-01 DIAGNOSIS — I129 Hypertensive chronic kidney disease with stage 1 through stage 4 chronic kidney disease, or unspecified chronic kidney disease: Secondary | ICD-10-CM | POA: Diagnosis not present

## 2019-08-01 DIAGNOSIS — E785 Hyperlipidemia, unspecified: Secondary | ICD-10-CM | POA: Diagnosis not present

## 2019-08-01 DIAGNOSIS — L89620 Pressure ulcer of left heel, unstageable: Secondary | ICD-10-CM | POA: Diagnosis not present

## 2019-08-01 DIAGNOSIS — R1310 Dysphagia, unspecified: Secondary | ICD-10-CM | POA: Diagnosis not present

## 2019-08-01 DIAGNOSIS — M25422 Effusion, left elbow: Secondary | ICD-10-CM | POA: Diagnosis not present

## 2019-08-01 DIAGNOSIS — Z720 Tobacco use: Secondary | ICD-10-CM | POA: Diagnosis not present

## 2019-08-01 NOTE — Progress Notes (Signed)
Curtis Jenkins, Curtis Jenkins ([id number]) Visit Report for 07/28/2019 Arrival Information Details Patient Name: Date of Service: [place] RD G. 07/28/2019 12:30 PM Medical Record Number: [id number] Patient Account Number: [id number] Date of Birth/Sex: Treating RN: 10-Apr-1935 (84 y.o. [name] Primary Care Elcie Pelster: [employer] Other Clinician: Referring Elainah Rhyne: Treating Corleone Biegler/Extender: Garfield Cornea in Treatment: 5 Visit Information History Since Last Visit Added or deleted any medications: No Patient Arrived: Wheel Chair Any new allergies or adverse reactions: No Arrival Time: 12:50 Had a fall or experienced change in No Accompanied By: daughter activities of daily living that may affect Transfer Assistance: Manual risk of falls: Patient Identification Verified: Yes Signs or symptoms of abuse/neglect since last visito No Secondary Verification Process Completed: Yes Hospitalized since last visit: No Patient Requires Transmission-Based Precautions: No Implantable device outside of the clinic excluding No Patient Has Alerts: Yes cellular tissue based products placed in the center Patient Alerts: Left ABI: 0.77 since last visit: Has Dressing in Place as Prescribed: Yes Pain Present Now: No Electronic Signature(s) Signed: 07/28/2019 5:52:12 PM By: Kela Millin Entered By: Kela Millin on 07/28/2019 12:51:00 -------------------------------------------------------------------------------- Clinic Level of Care Assessment Details Patient Name: Date of Service: MA Curtis Jenkins [place] RD G. 07/28/2019 12:30 PM Medical Record Number: [id number] Patient Account Number: [id number] Date of Birth/Sex: Treating RN: December 06, 1935 (84 y.o. Curtis Jenkins Primary Care Mahina Salatino: [employer] Other Clinician: Referring Ednah Hammock: Treating Hunner Garcon/Extender: Garfield Cornea in Treatment: 5 Clinic Level of Care Assessment  Items TOOL 4 Quantity Score X- 1 0 Use when only an EandM is performed on FOLLOW-UP visit ASSESSMENTS - Nursing Assessment / Reassessment X- 1 10 Reassessment of Co-morbidities (includes updates in patient status) X- 1 5 Reassessment of Adherence to Treatment Plan ASSESSMENTS - Wound and Skin A ssessment / Reassessment X - Simple Wound Assessment / Reassessment - one wound 1 5 []  - 0 Complex Wound Assessment / Reassessment - multiple wounds []  - 0 Dermatologic / Skin Assessment (not related to wound area) ASSESSMENTS - Focused Assessment []  - 0 Circumferential Edema Measurements - multi extremities []  - 0 Nutritional Assessment / Counseling / Intervention X- 1 5 Lower Extremity Assessment (monofilament, tuning fork, pulses) []  - 0 Peripheral Arterial Disease Assessment (using hand held doppler) ASSESSMENTS - Ostomy and/or Continence Assessment and Care []  - 0 Incontinence Assessment and Management []  - 0 Ostomy Care Assessment and Management (repouching, etc.) PROCESS - Coordination of Care X - Simple Patient / Family Education for ongoing care 1 15 []  - 0 Complex (extensive) Patient / Family Education for ongoing care X- 1 10 Staff obtains Programmer, systems, Records, T Results / Process Orders est X- 1 10 Staff telephones HHA, Nursing Homes / Clarify orders / etc []  - 0 Routine Transfer to another Facility (non-emergent condition) []  - 0 Routine Hospital Admission (non-emergent condition) []  - 0 New Admissions / Biomedical engineer / Ordering NPWT Apligraf, etc. , []  - 0 Emergency Hospital Admission (emergent condition) X- 1 10 Simple Discharge Coordination []  - 0 Complex (extensive) Discharge Coordination PROCESS - Special Needs []  - 0 Pediatric / Minor Patient Management []  - 0 Isolation Patient Management []  - 0 Hearing / Language / Visual special needs []  - 0 Assessment of Community assistance (transportation, D/C planning, etc.) []  - 0 Additional  assistance / Altered mentation []  - 0 Support Surface(s) Assessment (bed, cushion, seat, etc.) INTERVENTIONS - Wound Cleansing / Measurement X - Simple Wound Cleansing - one wound 1 5 []  -  0 Complex Wound Cleansing - multiple wounds X- 1 5 Wound Imaging (photographs - any number of wounds) []  - 0 Wound Tracing (instead of photographs) X- 1 5 Simple Wound Measurement - one wound []  - 0 Complex Wound Measurement - multiple wounds INTERVENTIONS - Wound Dressings []  - 0 Small Wound Dressing one or multiple wounds X- 1 15 Medium Wound Dressing one or multiple wounds []  - 0 Large Wound Dressing one or multiple wounds X- 1 5 Application of Medications - topical []  - 0 Application of Medications - injection INTERVENTIONS - Miscellaneous []  - 0 External ear exam []  - 0 Specimen Collection (cultures, biopsies, blood, body fluids, etc.) []  - 0 Specimen(s) / Culture(s) sent or taken to Lab for analysis []  - 0 Patient Transfer (multiple staff / Civil Service fast streamer / Similar devices) []  - 0 Simple Staple / Suture removal (25 or less) []  - 0 Complex Staple / Suture removal (26 or more) []  - 0 Hypo / Hyperglycemic Management (close monitor of Blood Glucose) []  - 0 Ankle / Brachial Index (ABI) - do not check if billed separately X- 1 5 Vital Signs Has the patient been seen at the hospital within the last three years: Yes Total Score: 110 Level Of Care: New/Established - Level 3 Electronic Signature(s) Signed: 08/01/2019 4:12:59 PM By: Levan Hurst RN, BSN Entered By: Levan Hurst on 07/28/2019 13:23:01 -------------------------------------------------------------------------------- Encounter Discharge Information Details Patient Name: Date of Service: MA Curtis Jenkins NA RD G. 07/28/2019 12:30 PM Medical Record Number: [id number] Patient Account Number: [id number] Date of Birth/Sex: Treating RN: 1935-05-07 (84 y.o. Curtis Jenkins Primary Care Aston Lieske: [employer] Other  Clinician: Referring Stacee Earp: Treating Melchizedek Espinola/Extender: Garfield Cornea in Treatment: 5 Encounter Discharge Information Items Discharge Condition: Stable Ambulatory Status: Wheelchair Discharge Destination: Home Transportation: Private Auto Accompanied By: Charolett Bumpers Schedule Follow-up Appointment: Yes Clinical Summary of Care: Patient Declined Electronic Signature(s) Signed: 07/28/2019 5:47:12 PM By: Baruch Gouty RN, BSN Entered By: Baruch Gouty on 07/28/2019 13:31:55 -------------------------------------------------------------------------------- Lower Extremity Assessment Details Patient Name: Date of Service: MA Curtis Jenkins NA RD G. 07/28/2019 12:30 PM Medical Record Number: [id number] Patient Account Number: [id number] Date of Birth/Sex: Treating RN: [birth year] (84 y.o. [name] Primary Care Frankee Gritz: [employer] Other Clinician: Referring Nasire Reali: Treating Carly Sabo/Extender: Garfield Cornea in Treatment: 5 Edema Assessment Assessed: [name]: No] Patrice Paradise: No] Edema: [Left: Ye] [Right: s] Calf Left: Right: Point of Measurement: 39 cm From Medial Instep 34.5 cm cm Ankle Left: Right: Point of Measurement: 14 cm From Medial Instep 26 cm cm Vascular Assessment Pulses: Dorsalis Pedis Palpable: [Left:Yes] Electronic Signature(s) Signed: 07/28/2019 5:52:12 PM By: Kela Millin Entered By: Kela Millin on 07/28/2019 12:54:24 -------------------------------------------------------------------------------- Multi Wound Chart Details Patient Name: Date of Service: [name] NA RD G. 07/28/2019 12:30 PM Medical Record Number: [id number] Patient Account Number: [id number] Date of Birth/Sex: Treating RN: 1935/12/02 (84 y.o. Curtis Jenkins Primary Care Cartina Brousseau: [employer] Other Clinician: Referring Syona Wroblewski: Treating Mysha Peeler/Extender: Garfield Cornea in Treatment: 5 Vital  Signs Height(in): 71 Pulse(bpm): 19 Weight(lbs): 250 Blood Pressure(mmHg): 146/77 Body Mass Index(BMI): 35 Temperature(F): 98.5 Respiratory Rate(breaths/min): 18 Photos: [1:No Photos Left, Lateral Calcaneus] [2:No Photos Left Gluteus] [N/A:N/A N/A] Wound Location: [1:Blister] [2:Gradually Appeared] [N/A:N/A] Wounding Event: [1:Pressure Ulcer] [2:Pressure Ulcer] [N/A:N/A] Primary Etiology: [1:05/12/2019] [2:07/14/2019] [N/A:N/A] Date Acquired: [1:5] [2:1] [N/A:N/A] Weeks of Treatment: [1:Open] [2:Healed - Epithelialized] [N/A:N/A] Wound Status: [1:2.8x4.2x2.3] [2:0x0x0] [N/A:N/A] Measurements L x W x D (cm) [1:9.236] [2:0] [N/A:N/A] A (cm) :  rea [1:21.243] [2:0] [N/A:N/A] Volume (cm) : [1:3.90%] [2:100.00%] [N/A:N/A] % Reduction in A rea: [1:-38.10%] [2:100.00%] [N/A:N/A] % Reduction in Volume: [1:Unstageable/Unclassified] [2:Category/Stage II] [N/A:N/A] Classification: [1:Medium] [2:None Present] [N/A:N/A] Exudate A mount: [1:Purulent] [2:N/A] [N/A:N/A] Exudate Type: [1:yellow, brown, green] [2:N/A] [N/A:N/A] Exudate Color: [1:Well defined, not attached] [2:Distinct, outline attached] [N/A:N/A] Wound Margin: [1:Medium (34-66%)] [2:None Present (0%)] [N/A:N/A] Granulation A mount: [1:Pink] [2:N/A] [N/A:N/A] Granulation Quality: [1:Medium (34-66%)] [2:None Present (0%)] [N/A:N/A] Necrotic A mount: [1:Fat Layer (Subcutaneous Tissue)] [2:Fascia: No] [N/A:N/A] Exposed Structures: [1:Exposed: Yes Fascia: No Tendon: No Muscle: No Joint: No Bone: No None] [2:Fat Layer (Subcutaneous Tissue) Exposed: No Tendon: No Muscle: No Joint: No Bone: No Large (67-100%)] [N/A:N/A] Treatment Notes Electronic Signature(s) Signed: 07/28/2019 5:51:19 PM By: Linton Ham MD Signed: 08/01/2019 4:12:59 PM By: Levan Hurst RN, BSN Entered By: Linton Ham on 07/28/2019 13:13:51 -------------------------------------------------------------------------------- Multi-Disciplinary Care Plan  Details Patient Name: Date of Service: [name] NA RD G. 07/28/2019 12:30 PM Medical Record Number: [id number] Patient Account Number: [id number] Date of Birth/Sex: Treating RN: [birth year] (84 y.o. Curtis Jenkins Primary Care Danzig Macgregor: [employer] Other Clinician: Referring Sreya Froio: Treating Ashlee Bewley/Extender: Garfield Cornea in Treatment: 5 Active Inactive Wound/Skin Impairment Nursing Diagnoses: Knowledge deficit related to ulceration/compromised skin integrity Goals: Patient/caregiver will verbalize understanding of skin care regimen Date Initiated: 06/23/2019 Target Resolution Date: 08/19/2019 Goal Status: Active Interventions: Assess patient/caregiver ability to obtain necessary supplies Assess patient/caregiver ability to perform ulcer/skin care regimen upon admission and as needed Provide education on ulcer and skin care Treatment Activities: Skin care regimen initiated : 06/23/2019 Topical wound management initiated : 06/23/2019 Notes: Electronic Signature(s) Signed: 08/01/2019 4:12:59 PM By: Levan Hurst RN, BSN Entered By: Levan Hurst on 07/28/2019 13:10:07 -------------------------------------------------------------------------------- Pain Assessment Details Patient Name: Date of Service: [name] NA RD G. 07/28/2019 12:30 PM Medical Record Number: [id number] Patient Account Number: [id number] Date of Birth/Sex: Treating RN: 09/29/1935 (84 y.o. [name] Primary Care Stephens Shreve: [employer] Other Clinician: Referring Mariaeduarda Defranco: Treating Kushal Saunders/Extender: Garfield Cornea in Treatment: 5 Active Problems Location of Pain Severity and Description of Pain Patient Has Paino No Site Locations Pain Management and Medication Current Pain Management: Electronic Signature(s) Signed: 07/28/2019 5:52:12 PM By: Kela Millin Entered By: Kela Millin on 07/28/2019  12:53:44 -------------------------------------------------------------------------------- Patient/Caregiver Education Details Patient Name: Date of Service: MA Curtis Jenkins NA RD G. 7/15/2021andnbsp12:30 PM Medical Record Number: [id number] Patient Account Number: [id number] Date of Birth/Gender: Treating RN: 11-29-1935 (84 y.o. Curtis Jenkins Primary Care Physician: [employer] Other Clinician: Referring Physician: Treating Physician/Extender: Garfield Cornea in Treatment: 5 Education Assessment Education Provided To: Patient Education Topics Provided Pressure: Methods: Explain/Verbal Responses: State content correctly Wound/Skin Impairment: Methods: Explain/Verbal Responses: State content correctly Electronic Signature(s) Signed: 08/01/2019 4:12:59 PM By: Levan Hurst RN, BSN Entered By: Levan Hurst on 07/28/2019 13:10:26 -------------------------------------------------------------------------------- Wound Assessment Details Patient Name: Date of Service: MA Curtis Jenkins NA RD G. 07/28/2019 12:30 PM Medical Record Number: [id number] Patient Account Number: [id number] Date of Birth/Sex: Treating RN: 1935-08-11 (84 y.o. [name] Primary Care Domanick Cuccia: [employer] Other Clinician: Referring Lavanya Roa: Treating Nihira Puello/Extender: Garfield Cornea in Treatment: 5 Wound Status Wound Number: 1 Primary Etiology: Pressure Ulcer Wound Location: Left, Lateral Calcaneus Wound Status: Open Wounding Event: Blister Date Acquired: 05/12/2019 Weeks Of Treatment: 5 Clustered Wound: No Photos Photo Uploaded By: Mikeal Hawthorne on 07/29/2019 13:47:20 Wound Measurements Length: (cm) 2.8 Width: (cm) 4.2 Depth: (cm) 2.3 Area: (cm) 9.236 Volume: (  cm) 21.243 % Reduction in Area: 3.9% % Reduction in Volume: -38.1% Epithelialization: None Tunneling: No Undermining: No Wound Description Classification:  Unstageable/Unclassified Wound Margin: Well defined, not attached Exudate Amount: Medium Exudate Type: Purulent Exudate Color: yellow, brown, green Foul Odor After Cleansing: No Slough/Fibrino Yes Wound Bed Granulation Amount: Medium (34-66%) Exposed Structure Granulation Quality: Pink Fascia Exposed: No Necrotic Amount: Medium (34-66%) Fat Layer (Subcutaneous Tissue) Exposed: Yes Necrotic Quality: Adherent Slough Tendon Exposed: No Muscle Exposed: No Joint Exposed: No Bone Exposed: No Treatment Notes Wound #1 (Left, Lateral Calcaneus) 3. Primary Dressing Applied Calcium Alginate Ag 4. Secondary Dressing Dry Gauze Roll Gauze Heel Cup 5. Secured With Tubular Bandage Notes foam to gluteus Electronic Signature(s) Signed: 07/28/2019 5:52:12 PM By: Kela Millin Entered By: Kela Millin on 07/28/2019 13:01:20 -------------------------------------------------------------------------------- Wound Assessment Details Patient Name: Date of Service: [name] NA RD G. 07/28/2019 12:30 PM Medical Record Number: [id number] Patient Account Number: [id number] Date of Birth/Sex: Treating RN: Sep 28, 1935 (84 y.o. [name] Primary Care Kshawn Canal: [employer] Other Clinician: Referring Jakerria Kingbird: Treating Dandre Sisler/Extender: Garfield Cornea in Treatment: 5 Wound Status Wound Number: 2 Primary Etiology: Pressure Ulcer Wound Location: Left Gluteus Wound Status: Healed - Epithelialized Wounding Event: Gradually Appeared Date Acquired: 07/14/2019 Weeks Of Treatment: 1 Clustered Wound: No Photos Photo Uploaded By: Mikeal Hawthorne on 07/29/2019 13:47:20 Wound Measurements Length: (cm) Width: (cm) Depth: (cm) Area: (cm) Volume: (cm) 0 % Reduction in Area: 100% 0 % Reduction in Volume: 100% 0 Epithelialization: Large (67-100%) 0 Tunneling: No 0 Undermining: No Wound Description Classification: Category/Stage II Wound Margin:  Distinct, outline attached Exudate Amount: None Present Foul Odor After Cleansing: No Slough/Fibrino No Wound Bed Granulation Amount: None Present (0%) Exposed Structure Necrotic Amount: None Present (0%) Fascia Exposed: No Fat Layer (Subcutaneous Tissue) Exposed: No Tendon Exposed: No Muscle Exposed: No Joint Exposed: No Bone Exposed: No Electronic Signature(s) Signed: 07/28/2019 5:52:12 PM By: Kela Millin Entered By: Kela Millin on 07/28/2019 13:03:03 -------------------------------------------------------------------------------- Vitals Details Patient Name: Date of Service: MA Curtis Jenkins NA RD G. 07/28/2019 12:30 PM Medical Record Number: [id number] Patient Account Number: [id number] Date of Birth/Sex: Treating RN: November 28, 1935 (84 y.o. [name] Primary Care Elfreda Blanchet: [employer] Other Clinician: Referring Cathan Gearin: Treating Yitzel Shasteen/Extender: Garfield Cornea in Treatment: 5 Vital Signs Time Taken: 12:50 Temperature (F): 98.5 Height (in): 71 Pulse (bpm): 71 Weight (lbs): 250 Respiratory Rate (breaths/min): 18 Body Mass Index (BMI): 34.9 Blood Pressure (mmHg): 146/77 Reference Range: 80 - 120 mg / dl Electronic Signature(s) Signed: 07/28/2019 5:52:12 PM By: Kela Millin Entered By: Kela Millin on 07/28/2019 12:53:39

## 2019-08-01 NOTE — Progress Notes (Signed)
LUISANTONIO, ADINOLFI ([id number]) Visit Report for 07/28/2019 HPI Details Patient Name: Date of Service: [name] 07/28/2019 12:30 PM Medical Record Number: [id number] Patient Account Number: [id number] Date of Birth/Sex: Treating RN: 05/15/1935 (84 y.o. [name] Primary Care Provider: [employer] Other Clinician: Referring Provider: Treating Provider/Extender: Garfield Cornea in Treatment: 5 History of Present Illness HPI Description: ADMISSION 06/23/19 This is an 84 year old man who lives in [place]. He was admitted to hospital in March with altered functional level. 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 He came home with physical therapy at some point he was discovered that he had pressure ulcer on the left heel which started as a blister. He has had [facility] health [[facility] going out to change dressings. They have been using Medihoney. The patient is minimally ambulatory only for transfers. They have been offloading the heel by using pillows etc. He spends most of his day in bed or in a recliner. 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 His daughter lives next door and there are other family members close by. ABI in our clinic was 0.77 on the left 06/28/2019; patient I admitted to the clinic last week who has a deep pressure ulcer on the left heel. Culture I did of this area showed Enterococcus and staph epidermidis. Empirically put him on Augmentin last week which will continue for another week. His x-ray suggested possible calcaneal erosion. He will also require an MRI. We are using silver alginate. Arterial studies are on 6/22 6/21; this is a patient with a deep pressure  ulcer on the left heel. He has his arterial studies tomorrow however currently his MRI is only booked for 7/14 at [facility]. This will not suffice. He is still on Augmentin that I started him on last week. Culture of the area superficially I did showed Enterococcus. 6/28; arterial studies got delayed until 6/30. MRI is for 7/3. He has completed Augmentin. We are using silver alginate in the wound 7/6; arterial studies actually were surprisingly good. On the left his ABI was 1 TBI is slightly reduced at 0.5 biphasic waveforms. Unfortunately his MRI documents osteomyelitis deep to the wound. Negative for abscess or septic joint. We have been using silver alginate. 7/15; we still have not heard from infectious disease. Bone culture I did showed a few Enterococcus faecalis and ampicillin sensitive. I got a few fragments of bone that did not show definite osteomyelitis nevertheless I fairly certain that this is what the patient has. With regards to the buttock area on the left buttock this is healed over. Still some erythema in the area probably indicative of pressure, wetness friction etc. We went over offloading mechanism Electronic Signature(s) Signed: 07/28/2019 5:51:19 PM By: Linton Ham MD Entered By: Linton Ham on 07/28/2019 13:14:56 -------------------------------------------------------------------------------- Physical Exam Details Patient Name: Date of Service: [name] NA RD G. 07/28/2019 12:30 PM Medical Record Number: [id number] Patient Account Number: [id number] Date of Birth/Sex: Treating RN: [birth year] (84 y.o. [name] Primary Care Provider: [employer] Other Clinician: Referring Provider: Treating Provider/Extender: Garfield Cornea in Treatment: 5 Constitutional Patient is hypertensive.. Pulse regular and within target range for patient.Marland Kitchen Respirations regular, non-labored and within target range.. Temperature is normal and within the  target range for the patient.Marland Kitchen Appears in no distress. Notes Wound exam; left lateral calcaneus. This is still open to bone. Other than that does not look ominous. No evidence of surrounding infection no mucus no purulence. Pedal pulses palpable Electronic Signature(s) Signed: 07/28/2019 5:51:19 PM By: Linton Ham MD Entered By: Linton Ham on 07/28/2019 13:16:25 -------------------------------------------------------------------------------- Physician Orders Details Patient Name: Date of Service: [name] NA RD G. 07/28/2019 12:30 PM Medical Record Number: [id number] Patient Account Number: [id number] Date of Birth/Sex: Treating RN: Jul 07, 1935 (84 y.o. 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 Dressing Change Frequency Wound #1 Left,Lateral Calcaneus Change dressing every day. - home health change twice a week. wound center weekly. all other days family to change. Wound Cleansing Clean wound with Wound Cleanser - or wash with soap and water Primary Wound Dressing Wound #1 Left,Lateral Calcaneus Calcium Alginate with Silver Secondary Dressing Foam Border - to healed area on left gluteus Wound #1 Left,Lateral Calcaneus Kerlix/Rolled Gauze Dry Gauze Heel Cup Edema Control Elevate legs to the level of the heart or above for 30 minutes daily and/or when sitting, a frequency of: - throughout the day Off-Loading Turn and reposition every 2 hours Other: - float heels with bunny boots or pillows while rest in bed or  chair to aid in offloading pressure to heels. [facility] skilled nursing for wound care. - [facility] Electronic Signature(s) Signed: 07/28/2019 5:51:19 PM By: Linton Ham MD Signed: 08/01/2019 4:12:59 PM By: Levan Hurst RN, BSN Entered By: Levan Hurst on 07/28/2019 13:13:22 -------------------------------------------------------------------------------- Problem List Details Patient Name: Date of Service: [name] NA RD G. 07/28/2019 12:30 PM Medical Record Number: [id number] Patient Account Number: [id number] Date of Birth/Sex: Treating RN: [birth year] (84 y.o. 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 06/23/2019 No Yes S17.793 Other chronic osteomyelitis, left ankle and foot 07/19/2019 No Yes L89.322 Pressure ulcer of left buttock, stage 2 07/19/2019 No Yes Inactive Problems ICD-10 Code Description Active Date Inactive Date L03.116 Cellulitis of left lower limb 06/23/2019 06/23/2019 I70.244 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 07/28/2019 12:30 PM Medical Record Number: [id number] Patient Account Number: [id number] Date of Birth/Sex: Treating RN: [birth year] (84 y.o. [name] Primary Care Provider: [employer] Other Clinician: Referring Provider: Treating  Provider/Extender: Garfield Cornea in Treatment: 5 Subjective History of Present Illness (HPI) ADMISSION 06/23/19 This is an 84 year old man who lives in [place]. He was admitted to hospital in March with altered functional level. 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 He came home with physical therapy at some point he was discovered that he had pressure ulcer on the left  heel which started as a blister. He has had home health [facility]} going out to change dressings. They have been using Medihoney. The patient is minimally ambulatory only for transfers. They have been offloading the heel by using pillows etc. He spends most of his day in bed or in a recliner. 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 His daughter lives next door and there are other family members close by. ABI in our clinic was 0.77 on the left 06/28/2019; patient I admitted to the clinic last week who has a deep pressure ulcer on the left heel. Culture I did of this area showed Enterococcus and staph epidermidis. Empirically put him on Augmentin last week which will continue for another week. His x-ray suggested possible calcaneal erosion. He will also require an MRI. We are using silver alginate. Arterial studies are on 6/22 6/21; this is a patient with a deep pressure ulcer on the left heel. He has his arterial studies tomorrow however currently his MRI is only booked for 7/14 at [facility]. This will not suffice. He is still on Augmentin that I started him on last week. Culture of the area superficially I did showed Enterococcus. 6/28; arterial studies got delayed until 6/30. MRI is for 7/3. He has completed Augmentin. We are using  silver alginate in the wound 7/6; arterial studies actually were surprisingly good. On the left his ABI was 1 TBI is slightly reduced at 0.5 biphasic waveforms. Unfortunately his MRI documents osteomyelitis deep to the wound. Negative for abscess or septic joint. We have been using silver alginate. 7/15; we still have not heard from infectious disease. Bone culture I did showed a few Enterococcus faecalis and ampicillin sensitive. I got a few fragments of bone that did not show definite osteomyelitis nevertheless I fairly certain that this is what the patient has. With regards to the buttock area on the left buttock this is healed over. Still some erythema in the area probably indicative of pressure, wetness friction etc. We went over offloading mechanism Objective Constitutional Patient is hypertensive.. Pulse regular and within target range for patient.Marland Kitchen Respirations regular, non-labored and within target range.. Temperature is normal and within the target range for the patient.Marland Kitchen Appears in no distress. Vitals Time Taken: 12:50 PM, Height: 71 in, Weight: 250 lbs, BMI: 34.9, Temperature: 98.5 F, Pulse: 71 bpm, Respiratory Rate: 18 breaths/min, Blood Pressure: 146/77 mmHg. General Notes: Wound exam; left lateral calcaneus. This is still open to bone. Other than that does not look ominous. No evidence of surrounding infection no mucus no purulence. Pedal pulses palpable Integumentary (Hair, Skin) Wound #1 status is Open. Original cause of wound was Blister. The wound is located on the Left,Lateral Calcaneus. The wound measures 2.8cm length x 4.2cm width x 2.3cm depth; 9.236cm^2 area and 21.243cm^3 volume. There is Fat Layer (Subcutaneous Tissue) Exposed exposed. There is no tunneling or undermining noted. There is a medium amount of purulent drainage noted. The wound margin is well defined and not attached to the wound base. There is medium (34-66%) pink granulation within the wound bed. There is  a medium (34-66%) amount of necrotic tissue within the wound bed including Adherent Slough. Wound #2 status is Healed - Epithelialized. Original cause of wound was Gradually Appeared. The wound is located on the Left Gluteus. The wound measures 0cm  length x 0cm width x 0cm depth; 0cm^2 area and 0cm^3 volume. There is no tunneling or undermining noted. There is a none present amount of drainage noted. The wound margin is distinct with the outline attached to the wound base. There is no granulation within the wound bed. There is no necrotic tissue within the wound bed. Assessment Active Problems ICD-10 Pressure ulcer of left heel, unstageable Idiopathic progressive neuropathy Other chronic osteomyelitis, left ankle and foot Pressure ulcer of left buttock, stage 2 Plan Follow-up Appointments: Return Appointment in 2 weeks. Dressing Change Frequency: Wound #1 Left,Lateral Calcaneus: Change dressing every day. - home health change twice a week. wound center weekly. all other days family to change. Wound Cleansing: Clean wound with Wound Cleanser - or wash with soap and water Primary Wound Dressing: Wound #1 Left,Lateral Calcaneus: Calcium Alginate with Silver Secondary Dressing: Foam Border - to healed area on left gluteus Wound #1 Left,Lateral Calcaneus: Kerlix/Rolled Gauze Dry Gauze Heel Cup Edema Control: Elevate legs to the level of the heart or above for 30 minutes daily and/or when sitting, a frequency of: - throughout the day Off-Loading: Turn and reposition every 2 hours Other: - float heels with bunny boots or pillows while rest in bed or chair to aid in offloading pressure to heels. Home Health: [facility] skilled nursing for wound care. - [facility] 1. We continued with silver alginate as the primary dressing 2. Went over offloading issues with regards to buttocks wounds they are already doing most of these 3. Reattempting to get infectious disease involved here.  Very clearly the patient will need ampicillin or equivalent for Enterococcus faecalis however I suspect this will need to be IV rather than oral although my sense of that is that things are changing the studies with regards to this. Electronic Signature(s) Signed: 07/28/2019 5:51:19 PM By: Linton Ham MD Entered By: Linton Ham on 07/28/2019 13:17:39 -------------------------------------------------------------------------------- SuperBill Details Patient Name: Date of Service: [name] NA RD G. 07/28/2019 Medical Record Number: [id number] Patient Account Number: [id number] Date of Birth/Sex: Treating RN: 1935/06/02 (84 y.o. 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.3 Idiopathic progressive neuropathy M86.672 Other chronic osteomyelitis, left ankle and foot L89.322 Pressure ulcer of left buttock, stage 2 Facility Procedures CPT4 Code: [id number] Description: 97353 - WOUND CARE VISIT-LEV 3 EST PT Modifier: Quantity: 1 Physician Procedures : CPT4 Code Description Modifier [id number] - WC PHYS LEVEL 3 - EST PT ICD-10 Diagnosis Description L89.620 Pressure ulcer of left heel, unstageable L89.322 Pressure ulcer of left buttock, stage 2 Q22.297 Other chronic osteomyelitis, left ankle and  foot Quantity: 1 Electronic Signature(s) Signed: 07/28/2019 5:51:19 PM By: Linton Ham MD Signed: 08/01/2019 4:12:59 PM By: Levan Hurst RN, BSN Entered By: Levan Hurst on 07/28/2019 13:23:14

## 2019-08-04 DIAGNOSIS — R1310 Dysphagia, unspecified: Secondary | ICD-10-CM | POA: Diagnosis not present

## 2019-08-04 DIAGNOSIS — I129 Hypertensive chronic kidney disease with stage 1 through stage 4 chronic kidney disease, or unspecified chronic kidney disease: Secondary | ICD-10-CM | POA: Diagnosis not present

## 2019-08-04 DIAGNOSIS — I69954 Hemiplegia and hemiparesis following unspecified cerebrovascular disease affecting left non-dominant side: Secondary | ICD-10-CM | POA: Diagnosis not present

## 2019-08-04 DIAGNOSIS — L89620 Pressure ulcer of left heel, unstageable: Secondary | ICD-10-CM | POA: Diagnosis not present

## 2019-08-04 DIAGNOSIS — E785 Hyperlipidemia, unspecified: Secondary | ICD-10-CM | POA: Diagnosis not present

## 2019-08-04 DIAGNOSIS — M25422 Effusion, left elbow: Secondary | ICD-10-CM | POA: Diagnosis not present

## 2019-08-04 DIAGNOSIS — N182 Chronic kidney disease, stage 2 (mild): Secondary | ICD-10-CM | POA: Diagnosis not present

## 2019-08-04 DIAGNOSIS — M159 Polyosteoarthritis, unspecified: Secondary | ICD-10-CM | POA: Diagnosis not present

## 2019-08-04 DIAGNOSIS — Z720 Tobacco use: Secondary | ICD-10-CM | POA: Diagnosis not present

## 2019-08-04 DIAGNOSIS — I69391 Dysphagia following cerebral infarction: Secondary | ICD-10-CM | POA: Diagnosis not present

## 2019-08-04 NOTE — Patient Instructions (Signed)
Curtis Jenkins  08/04/2019     @PREFPERIOPPHARMACY @   Your procedure is scheduled on  08/11/2019.  Report to [facility] at  1200  P.M.  Call this number if you have problems the morning of surgery:  [phone]   Remember:  Do not eat or drink after midnight.                       Take these medicines the morning of surgery with A SIP OF WATER amlodipine, tramadol.    Do not wear jewelry, make-up or nail polish.  Do not wear lotions, powders, or perfumes. Please wear deodorant and brush your teeth.  Do not shave 48 hours prior to surgery.  Men may shave face and neck.  Do not bring valuables to the hospital.  [facility] is not responsible for any belongings or valuables.  Contacts, dentures or bridgework may not be worn into surgery.  Leave your suitcase in the car.  After surgery it may be brought to your room.  For patients admitted to the hospital, discharge time will be determined by your treatment team.  Patients discharged the day of surgery will not be allowed to drive home.   Name and phone number of your driver:   family Special instructions:  DO NOT smoke the morning of your procedure.  Please read over the following fact sheets that you were given. Anesthesia Post-op Instructions and Care and Recovery After Surgery       Cystoscopy Cystoscopy is a procedure that is used to help diagnose and sometimes treat conditions that affect the lower urinary tract. The lower urinary tract includes the bladder and the urethra. The urethra is the tube that drains urine from the bladder. Cystoscopy is done using a thin, tube-shaped instrument with a light and camera at the end (cystoscope). The cystoscope may be hard or flexible, depending on the goal of the procedure. The cystoscope is inserted through the urethra, into the bladder. Cystoscopy may be recommended if you have:  Urinary tract infections that keep coming back.  Blood in the urine  (hematuria).  An inability to control when you urinate (urinary incontinence) or an overactive bladder.  Unusual cells found in a urine sample.  A blockage in the urethra, such as a urinary stone.  Painful urination.  An abnormality in the bladder found during an intravenous pyelogram (IVP) or CT scan. Cystoscopy may also be done to remove a sample of tissue to be examined under a microscope (biopsy). Tell a health care provider about:  Any allergies you have.  All medicines you are taking, including vitamins, herbs, eye drops, creams, and over-the-counter medicines.  Any problems you or family members have had with anesthetic medicines.  Any blood disorders you have.  Any surgeries you have had.  Any medical conditions you have.  Whether you are pregnant or may be pregnant. What are the risks? Generally, this is a safe procedure. However, problems may occur, including:  Infection.  Bleeding.  Allergic reactions to medicines.  Damage to other structures or organs. What happens before the procedure?  Ask your health care provider about: ? Changing or stopping your regular medicines. This is especially important if you are taking diabetes medicines or blood thinners. ? Taking medicines such as aspirin and ibuprofen. These medicines can thin your blood. Do not take these medicines unless your health care provider tells you to take them. ? Taking  over-the-counter medicines, vitamins, herbs, and supplements.  Follow instructions from your health care provider about eating or drinking restrictions.  Ask your health care provider what steps will be taken to help prevent infection. These may include: ? Washing skin with a germ-killing soap. ? Taking antibiotic medicine.  You may have an exam or testing, such as: ? X-rays of the bladder, urethra, or kidneys. ? Urine tests to check for signs of infection.  Plan to have someone take you home from the hospital or  clinic. What happens during the procedure?   You will be given one or more of the following: ? A medicine to help you relax (sedative). ? A medicine to numb the area (local anesthetic).  The area around the opening of your urethra will be cleaned.  The cystoscope will be passed through your urethra into your bladder.  Germ-free (sterile) fluid will flow through the cystoscope to fill your bladder. The fluid will stretch your bladder so that your health care provider can clearly examine your bladder walls.  Your doctor will look at the urethra and bladder. Your doctor may take a biopsy or remove stones.  The cystoscope will be removed, and your bladder will be emptied. The procedure may vary among health care providers and hospitals. What can I expect after the procedure? After the procedure, it is common to have:  Some soreness or pain in your abdomen and urethra.  Urinary symptoms. These include: ? Mild pain or burning when you urinate. Pain should stop within a few minutes after you urinate. This may last for up to 1 week. ? A small amount of blood in your urine for several days. ? Feeling like you need to urinate but producing only a small amount of urine. Follow these instructions at home: Medicines  Take over-the-counter and prescription medicines only as told by your health care provider.  If you were prescribed an antibiotic medicine, take it as told by your health care provider. Do not stop taking the antibiotic even if you start to feel better. General instructions  Return to your normal activities as told by your health care provider. Ask your health care provider what activities are safe for you.  Do not drive for 24 hours if you were given a sedative during your procedure.  Watch for any blood in your urine. If the amount of blood in your urine increases, call your health care provider.  Follow instructions from your health care provider about eating or drinking  restrictions.  If a tissue sample was removed for testing (biopsy) during your procedure, it is up to you to get your test results. Ask your health care provider, or the department that is doing the test, when your results will be ready.  Drink enough fluid to keep your urine pale yellow.  Keep all follow-up visits as told by your health care provider. This is important. Contact a health care provider if you:  Have pain that gets worse or does not get better with medicine, especially pain when you urinate.  Have trouble urinating.  Have more blood in your urine. Get help right away if you:  Have blood clots in your urine.  Have abdominal pain.  Have a fever or chills.  Are unable to urinate. Summary  Cystoscopy is a procedure that is used to help diagnose and sometimes treat conditions that affect the lower urinary tract.  Cystoscopy is done using a thin, tube-shaped instrument with a light and camera at the end.  After the procedure, it is common to have some soreness or pain in your abdomen and urethra.  Watch for any blood in your urine. If the amount of blood in your urine increases, call your health care provider.  If you were prescribed an antibiotic medicine, take it as told by your health care provider. Do not stop taking the antibiotic even if you start to feel better. This information is not intended to replace advice given to you by your health care provider. Make sure you discuss any questions you have with your health care provider. Document Revised: 12/22/2017 Document Reviewed: 12/22/2017 Elsevier Patient Education  [place].  Transurethral Resection of Bladder Tumor, Care After This sheet gives you information about how to care for yourself after your procedure. Your health care provider may also give you more specific instructions. If you have problems or questions, contact your health care provider. What can I expect after the procedure? After the  procedure, it is common to have:  A small amount of blood in your urine for up to 2 weeks.  Soreness or mild pain from your catheter. After your catheter is removed, you may have mild soreness, especially when urinating.  Pain in your lower abdomen. Follow these instructions at home: Medicines   Take over-the-counter and prescription medicines only as told by your health care provider.  If you were prescribed an antibiotic medicine, take it as told by your health care provider. Do not stop taking the antibiotic even if you start to feel better.  Do not drive for 24 hours if you were given a sedative during your procedure.  Ask your health care provider if the medicine prescribed to you: ? Requires you to avoid driving or using heavy machinery. ? Can cause constipation. You may need to take these actions to prevent or treat constipation:  Take over-the-counter or prescription medicines.  Eat foods that are high in fiber, such as beans, whole grains, and fresh fruits and vegetables.  Limit foods that are high in fat and processed sugars, such as fried or sweet foods. Activity  Return to your normal activities as told by your health care provider. Ask your health care provider what activities are safe for you.  Do not lift anything that is heavier than 10 lb (4.5 kg), or the limit that you are told, until your health care provider says that it is safe.  Avoid intense physical activity for as long as told by your health care provider.  Rest as told by your health care provider.  Avoid sitting for a long time without moving. Get up to take short walks every 1-2 hours. This is important to improve blood flow and breathing. Ask for help if you feel weak or unsteady. General instructions   Do not drink alcohol for as long as told by your health care provider. This is especially important if you are taking prescription pain medicines.  Do not take baths, swim, or use a hot tub until  your health care provider approves. Ask your health care provider if you may take showers. You may only be allowed to take sponge baths.  If you have a catheter, follow instructions from your health care provider about caring for your catheter and your drainage bag.  Drink enough fluid to keep your urine pale yellow.  Wear compression stockings as told by your health care provider. These stockings help to prevent blood clots and reduce swelling in your legs.  Keep all follow-up visits  as told by your health care provider. This is important. ? You will need to be followed closely with regular checks of your bladder and urethra (cystoscopies) to make sure that the cancer does not come back. Contact a health care provider if:  You have pain that gets worse or does not improve with medicine.  You have blood in your urine for more than 2 weeks.  You have cloudy or bad-smelling urine.  You become constipated. Signs of constipation may include having: ? Fewer than three bowel movements in a week. ? Difficulty having a bowel movement. ? Stools that are dry, hard, or larger than normal.  You have a fever. Get help right away if:  You have: ? Severe pain. ? Bright red blood in your urine. ? Blood clots in your urine. ? A lot of blood in your urine.  Your catheter has been removed and you are not able to urinate.  You have a catheter in place and the catheter is not draining urine. Summary  After your procedure, it is common to have a small amount of blood in your urine, soreness or mild pain from your catheter, and pain in your lower abdomen.  Take over-the-counter and prescription medicines only as told by your health care provider.  Rest as told by your health care provider. Follow your health care provider's instructions about returning to normal activities. Ask what activities are safe for you.  If you have a catheter, follow instructions from your health care provider about  caring for your catheter and your drainage bag.  Get help right away if you cannot urinate, you have severe pain, or you have bright red blood or blood clots in your urine. This information is not intended to replace advice given to you by your health care provider. Make sure you discuss any questions you have with your health care provider. Document Revised: 07/30/2017 Document Reviewed: 07/30/2017 Elsevier Patient Education  [place]. How to Use Chlorhexidine for Bathing Chlorhexidine gluconate (CHG) is a germ-killing (antiseptic) solution that is used to clean the skin. It can get rid of the bacteria that normally live on the skin and can keep them away for about 24 hours. To clean your skin with CHG, you may be given:  A CHG solution to use in the shower or as part of a sponge bath.  A prepackaged cloth that contains CHG. Cleaning your skin with CHG may help lower the risk for infection:  While you are staying in the intensive care unit of the hospital.  If you have a vascular access, such as a central line, to provide short-term or long-term access to your veins.  If you have a catheter to drain urine from your bladder.  If you are on a ventilator. A ventilator is a machine that helps you breathe by moving air in and out of your lungs.  After surgery. What are the risks? Risks of using CHG include:  A skin reaction.  Hearing loss, if CHG gets in your ears.  Eye injury, if CHG gets in your eyes and is not rinsed out.  The CHG product catching fire. Make sure that you avoid smoking and flames after applying CHG to your skin. Do not use CHG:  If you have a chlorhexidine allergy or have previously reacted to chlorhexidine.  On babies younger than 48 months of age. How to use CHG solution  Use CHG only as told by your health care provider, and follow the instructions  on the label.  Use the full amount of CHG as directed. Usually, this is one bottle. During a  shower Follow these steps when using CHG solution during a shower (unless your health care provider gives you different instructions): 1. Start the shower. 2. Use your normal soap and shampoo to wash your face and hair. 3. Turn off the shower or move out of the shower stream. 4. Pour the CHG onto a clean washcloth. Do not use any type of brush or rough-edged sponge. 5. Starting at your neck, lather your body down to your toes. Make sure you follow these instructions: ? If you will be having surgery, pay special attention to the part of your body where you will be having surgery. Scrub this area for at least 1 minute. ? Do not use CHG on your head or face. If the solution gets into your ears or eyes, rinse them well with water. ? Avoid your genital area. ? Avoid any areas of skin that have broken skin, cuts, or scrapes. ? Scrub your back and under your arms. Make sure to wash skin folds. 6. Let the lather sit on your skin for 1-2 minutes or as long as told by your health care provider. 7. Thoroughly rinse your entire body in the shower. Make sure that all body creases and crevices are rinsed well. 8. Dry off with a clean towel. Do not put any substances on your body afterward--such as powder, lotion, or perfume--unless you are told to do so by your health care provider. Only use lotions that are recommended by the manufacturer. 9. Put on clean clothes or pajamas. 10. If it is the night before your surgery, sleep in clean sheets.  During a sponge bath Follow these steps when using CHG solution during a sponge bath (unless your health care provider gives you different instructions): 1. Use your normal soap and shampoo to wash your face and hair. 2. Pour the CHG onto a clean washcloth. 3. Starting at your neck, lather your body down to your toes. Make sure you follow these instructions: ? If you will be having surgery, pay special attention to the part of your body where you will be having  surgery. Scrub this area for at least 1 minute. ? Do not use CHG on your head or face. If the solution gets into your ears or eyes, rinse them well with water. ? Avoid your genital area. ? Avoid any areas of skin that have broken skin, cuts, or scrapes. ? Scrub your back and under your arms. Make sure to wash skin folds. 4. Let the lather sit on your skin for 1-2 minutes or as long as told by your health care provider. 5. Using a different clean, wet washcloth, thoroughly rinse your entire body. Make sure that all body creases and crevices are rinsed well. 6. Dry off with a clean towel. Do not put any substances on your body afterward--such as powder, lotion, or perfume--unless you are told to do so by your health care provider. Only use lotions that are recommended by the manufacturer. 7. Put on clean clothes or pajamas. 8. If it is the night before your surgery, sleep in clean sheets. How to use CHG prepackaged cloths  Only use CHG cloths as told by your health care provider, and follow the instructions on the label.  Use the CHG cloth on clean, dry skin.  Do not use the CHG cloth on your head or face unless your health care provider  tells you to.  When washing with the CHG cloth: ? Avoid your genital area. ? Avoid any areas of skin that have broken skin, cuts, or scrapes. Before surgery Follow these steps when using a CHG cloth to clean before surgery (unless your health care provider gives you different instructions): 1. Using the CHG cloth, vigorously scrub the part of your body where you will be having surgery. Scrub using a back-and-forth motion for 3 minutes. The area on your body should be completely wet with CHG when you are done scrubbing. 2. Do not rinse. Discard the cloth and let the area air-dry. Do not put any substances on the area afterward, such as powder, lotion, or perfume. 3. Put on clean clothes or pajamas. 4. If it is the night before your surgery, sleep in clean  sheets.  For general bathing Follow these steps when using CHG cloths for general bathing (unless your health care provider gives you different instructions). 1. Use a separate CHG cloth for each area of your body. Make sure you wash between any folds of skin and between your fingers and toes. Wash your body in the following order, switching to a new cloth after each step: ? The front of your neck, shoulders, and chest. ? Both of your arms, under your arms, and your hands. ? Your stomach and groin area, avoiding the genitals. ? Your right leg and foot. ? Your left leg and foot. ? The back of your neck, your back, and your buttocks. 2. Do not rinse. Discard the cloth and let the area air-dry. Do not put any substances on your body afterward--such as powder, lotion, or perfume--unless you are told to do so by your health care provider. Only use lotions that are recommended by the manufacturer. 3. Put on clean clothes or pajamas. Contact a health care provider if:  Your skin gets irritated after scrubbing.  You have questions about using your solution or cloth. Get help right away if:  Your eyes become very red or swollen.  Your eyes itch badly.  Your skin itches badly and is red or swollen.  Your hearing changes.  You have trouble seeing.  You have swelling or tingling in your mouth or throat.  You have trouble breathing.  You swallow any chlorhexidine. Summary  Chlorhexidine gluconate (CHG) is a germ-killing (antiseptic) solution that is used to clean the skin. Cleaning your skin with CHG may help to lower your risk for infection.  You may be given CHG to use for bathing. It may be in a bottle or in a prepackaged cloth to use on your skin. Carefully follow your health care provider's instructions and the instructions on the product label.  Do not use CHG if you have a chlorhexidine allergy.  Contact your health care provider if your skin gets irritated after scrubbing. This  information is not intended to replace advice given to you by your health care provider. Make sure you discuss any questions you have with your health care provider. Document Revised: 03/18/2018 Document Reviewed: 11/27/2016 Elsevier Patient Education  [place].

## 2019-08-08 ENCOUNTER — Encounter (HOSPITAL_BASED_OUTPATIENT_CLINIC_OR_DEPARTMENT_OTHER): Payer: Medicare Other | Admitting: Internal Medicine

## 2019-08-09 ENCOUNTER — Encounter (HOSPITAL_COMMUNITY)
Admission: RE | Admit: 2019-08-09 | Discharge: 2019-08-09 | Disposition: A | Discharge status: Home / Self Care | Payer: Medicare Other | Source: Ambulatory Visit | Attending: Urology | Admitting: Urology

## 2019-08-09 ENCOUNTER — Encounter (HOSPITAL_COMMUNITY): Payer: Self-pay

## 2019-08-09 ENCOUNTER — Other Ambulatory Visit: Payer: Self-pay

## 2019-08-09 ENCOUNTER — Other Ambulatory Visit (HOSPITAL_COMMUNITY)
Admission: RE | Admit: 2019-08-09 | Discharge: 2019-08-09 | Disposition: A | Discharge status: Home / Self Care | Payer: Medicare Other | Source: Ambulatory Visit | Attending: Urology | Admitting: Urology

## 2019-08-09 DIAGNOSIS — M159 Polyosteoarthritis, unspecified: Secondary | ICD-10-CM | POA: Diagnosis not present

## 2019-08-09 DIAGNOSIS — M25422 Effusion, left elbow: Secondary | ICD-10-CM | POA: Diagnosis not present

## 2019-08-09 DIAGNOSIS — R1310 Dysphagia, unspecified: Secondary | ICD-10-CM | POA: Diagnosis not present

## 2019-08-09 DIAGNOSIS — Z01812 Encounter for preprocedural laboratory examination: Secondary | ICD-10-CM | POA: Insufficient documentation

## 2019-08-09 DIAGNOSIS — Z20822 Contact with and (suspected) exposure to covid-19: Secondary | ICD-10-CM | POA: Insufficient documentation

## 2019-08-09 DIAGNOSIS — N182 Chronic kidney disease, stage 2 (mild): Secondary | ICD-10-CM | POA: Diagnosis not present

## 2019-08-09 DIAGNOSIS — L89620 Pressure ulcer of left heel, unstageable: Secondary | ICD-10-CM | POA: Diagnosis not present

## 2019-08-09 DIAGNOSIS — E785 Hyperlipidemia, unspecified: Secondary | ICD-10-CM | POA: Diagnosis not present

## 2019-08-09 DIAGNOSIS — Z720 Tobacco use: Secondary | ICD-10-CM | POA: Diagnosis not present

## 2019-08-09 DIAGNOSIS — I69391 Dysphagia following cerebral infarction: Secondary | ICD-10-CM | POA: Diagnosis not present

## 2019-08-09 DIAGNOSIS — I129 Hypertensive chronic kidney disease with stage 1 through stage 4 chronic kidney disease, or unspecified chronic kidney disease: Secondary | ICD-10-CM | POA: Diagnosis not present

## 2019-08-09 DIAGNOSIS — I69954 Hemiplegia and hemiparesis following unspecified cerebrovascular disease affecting left non-dominant side: Secondary | ICD-10-CM | POA: Diagnosis not present

## 2019-08-09 HISTORY — DX: Sleep apnea, unspecified: G47.30

## 2019-08-09 LAB — CBC WITH DIFFERENTIAL/PLATELET
Abs Immature Granulocytes: 0.02 10*3/uL (ref 0.00–0.07)
Basophils Absolute: 0.1 10*3/uL (ref 0.0–0.1)
Basophils Relative: 1 %
Eosinophils Absolute: 0.6 10*3/uL — ABNORMAL HIGH (ref 0.0–0.5)
Eosinophils Relative: 9 %
HCT: 40 % (ref 39.0–52.0)
Hemoglobin: 12.4 g/dL — ABNORMAL LOW (ref 13.0–17.0)
Immature Granulocytes: 0 %
Lymphocytes Relative: 22 %
Lymphs Abs: 1.5 10*3/uL (ref 0.7–4.0)
MCH: 29.2 pg (ref 26.0–34.0)
MCHC: 31 g/dL (ref 30.0–36.0)
MCV: 94.3 fL (ref 80.0–100.0)
Monocytes Absolute: 0.7 10*3/uL (ref 0.1–1.0)
Monocytes Relative: 10 %
Neutro Abs: 3.9 10*3/uL (ref 1.7–7.7)
Neutrophils Relative %: 58 %
Platelets: 310 10*3/uL (ref 150–400)
RBC: 4.24 MIL/uL (ref 4.22–5.81)
RDW: 13.5 % (ref 11.5–15.5)
WBC: 6.8 10*3/uL (ref 4.0–10.5)
nRBC: 0 % (ref 0.0–0.2)

## 2019-08-09 LAB — COMPREHENSIVE METABOLIC PANEL
ALT: 13 U/L (ref 0–44)
AST: 14 U/L — ABNORMAL LOW (ref 15–41)
Albumin: 3.4 g/dL — ABNORMAL LOW (ref 3.5–5.0)
Alkaline Phosphatase: 92 U/L (ref 38–126)
Anion gap: 9 (ref 5–15)
BUN: 13 mg/dL (ref 8–23)
CO2: 28 mmol/L (ref 22–32)
Calcium: 8.5 mg/dL — ABNORMAL LOW (ref 8.9–10.3)
Chloride: 101 mmol/L (ref 98–111)
Creatinine, Ser: 1.21 mg/dL (ref 0.61–1.24)
GFR calc Af Amer: >60 mL/min (ref >60)
GFR calc non Af Amer: 55 mL/min — ABNORMAL LOW (ref >60)
Glucose, Bld: 103 mg/dL — ABNORMAL HIGH (ref 70–99)
Potassium: 4.2 mmol/L (ref 3.5–5.1)
Sodium: 138 mmol/L (ref 135–145)
Total Bilirubin: 0.4 mg/dL (ref 0.3–1.2)
Total Protein: 6.8 g/dL (ref 6.5–8.1)

## 2019-08-09 LAB — PROTIME-INR
INR: 1.4 — ABNORMAL HIGH (ref 0.8–1.2)
Prothrombin Time: 16.6 seconds — ABNORMAL HIGH (ref 11.4–15.2)

## 2019-08-09 LAB — SARS CORONAVIRUS 2 (TAT 6-24 HRS): SARS Coronavirus 2: NEGATIVE

## 2019-08-09 NOTE — Pre-Procedure Instructions (Signed)
Per pt's daughter Dr. Guadalupe Maple pt. Continuing half dose of Eliquis prior to procedure.

## 2019-08-09 NOTE — Progress Notes (Signed)
   08/09/19 1313  OBSTRUCTIVE SLEEP APNEA  Have you ever been diagnosed with sleep apnea through a sleep study? Yes  If yes, do you have and use a CPAP or BPAP machine every night? 0  Do you snore loudly (loud enough to be heard through closed doors)?  1  Do you often feel tired, fatigued, or sleepy during the daytime (such as falling asleep during driving or talking to someone)? 1  Has anyone observed you stop breathing during your sleep? 0  Do you have, or are you being treated for high blood pressure? 1  BMI more than 35 kg/m2? 0  Age > 50 (1-yes) 1  Neck circumference greater than:Male 16 inches or larger, Male 17inches or larger? 0  Male Gender (Yes=1) 1  Obstructive Sleep Apnea Score 5

## 2019-08-10 ENCOUNTER — Encounter: Payer: Self-pay | Admitting: Internal Medicine

## 2019-08-10 ENCOUNTER — Telehealth: Payer: Self-pay

## 2019-08-10 ENCOUNTER — Ambulatory Visit: Payer: Medicare Other | Admitting: Internal Medicine

## 2019-08-10 VITALS — BP 144/75 | HR 68

## 2019-08-10 DIAGNOSIS — I639 Cerebral infarction, unspecified: Secondary | ICD-10-CM | POA: Diagnosis not present

## 2019-08-10 DIAGNOSIS — M86672 Other chronic osteomyelitis, left ankle and foot: Secondary | ICD-10-CM

## 2019-08-10 DIAGNOSIS — L8990 Pressure ulcer of unspecified site, unspecified stage: Secondary | ICD-10-CM | POA: Diagnosis not present

## 2019-08-10 DIAGNOSIS — R1310 Dysphagia, unspecified: Secondary | ICD-10-CM | POA: Diagnosis not present

## 2019-08-10 NOTE — Progress Notes (Signed)
RFV: new patient for right heel osteo  Patient ID: Curtis Jenkins, male   DOB: 26-Oct-1935, 84 y.o.   MRN: [id number]  HPI Curtis Jenkins is a 84yo M with CVA, and left heel osteomyelitis currently on amox/clav. Referred by wound care clinic. Superficial cultures showing enterococcus faecalis (amp S) and diptheroids. He denies weight loss, no fevers, nor chills. Drainage followed by wound care.  Outpatient Encounter Medications as of 08/10/2019  Medication Sig   amLODipine (NORVASC) 5 MG tablet Take 5 mg by mouth daily.   amoxicillin-clavulanate (AUGMENTIN) 875-125 MG tablet Take 1 tablet by mouth 2 (two) times daily.   apixaban (ELIQUIS) 5 MG TABS tablet Take 2.5 mg by mouth 2 (two) times daily.   atorvastatin (LIPITOR) 80 MG tablet Take 1 tablet (80 mg total) by mouth at bedtime. (Patient not taking: Reported on 06/03/2019)   calcium-vitamin D (OSCAL WITH D) 500-200 MG-UNIT tablet Take 1 tablet by mouth 2 (two) times daily.   clotrimazole-betamethasone (LOTRISONE) cream Apply 1 application topically 2 (two) times daily as needed (irritation/rash.).    feeding supplement, ENSURE ENLIVE, (ENSURE ENLIVE) LIQD Take 237 mLs by mouth 3 (three) times daily after meals. (Patient taking differently: Take 237 mLs by mouth daily. )   Magnesium 200 MG TABS Take 200 mg by mouth in the morning and at bedtime.    Methylcellulose, Laxative, (FIBER THERAPY) 500 MG TABS Take 500 mg by mouth in the morning and at bedtime.   rosuvastatin (CRESTOR) 20 MG tablet Take 20 mg by mouth every evening.    traMADol (ULTRAM) 50 MG tablet Take 50 mg by mouth 3 (three) times daily as needed (pain.).    Vitamin D, Ergocalciferol, (DRISDOL) 1.25 MG (50000 UNIT) CAPS capsule Take 1 capsule (50,000 Units total) by mouth every 7 (seven) days. (Patient taking differently: Take 50,000 Units by mouth every Wednesday. )   zolpidem (AMBIEN) 10 MG tablet Take 10 mg by mouth at bedtime.    No facility-administered encounter  medications on file as of 08/10/2019.     Patient Active Problem List   Diagnosis Date Noted   PAF (paroxysmal atrial fibrillation) ([place]) 05/05/2019   Effusion of left elbow 04/12/2019   Chronic left shoulder pain 04/12/2019   Rt Occipital Lobe Stroke with Lt hemiparesis 04/09/2019   Dysphagia following cerebrovascular accident 04/09/2019   Speech disturbance due to Acute CVA 04/09/2019   Tobacco abuse-Chews Tobacco 04/09/2019   Paresthesia    Hypocalcemia 04/03/2019   Prolonged QT interval 04/03/2019   HTN (hypertension) 04/03/2019   HLD (hyperlipidemia) 04/03/2019   Weakness 04/03/2019     Health Maintenance Due  Topic Date Due   URINE MICROALBUMIN  Never done   COVID-19 Vaccine (1) Never done   TETANUS/TDAP  Never done   PNA vac Low Risk Adult (1 of 2 - PCV13) Never done    Social History   Tobacco Use   Smoking status: Never Smoker   Smokeless tobacco: Current User    Types: Chew  Substance Use Topics   Alcohol use: Not Currently   Drug use: Never   family history is not on file. Review of Systems Review of Systems  Constitutional: Negative for fever, chills, diaphoresis, activity change, appetite change, fatigue and unexpected weight change.  HENT: Negative for congestion, sore throat, rhinorrhea, sneezing, trouble swallowing and sinus pressure.  Eyes: Negative for photophobia and visual disturbance.  Respiratory: Negative for cough, chest tightness, shortness of breath, wheezing and stridor.  Cardiovascular: Negative for chest pain, palpitations  and leg swelling.  Gastrointestinal: Negative for nausea, vomiting, abdominal pain, diarrhea, constipation, blood in stool, abdominal distention and anal bleeding.  Genitourinary: Negative for dysuria, hematuria, flank pain and difficulty urinating.  Musculoskeletal: Negative for myalgias, back pain, joint swelling, arthralgias and gait problem.  Skin: + wound on left  Neurological: Negative for  dizziness, tremors, weakness and light-headedness.  Hematological: Negative for adenopathy. Does not bruise/bleed easily.  Psychiatric/Behavioral: Negative for behavioral problems, confusion, sleep disturbance, dysphoric mood, decreased concentration and agitation.    Physical Exam  BP (!) 144/75    Pulse 68    SpO2 98%  Physical Exam  Constitutional: He is oriented to person, place, and time. He appears well-developed and well-nourished. No distress.  HENT:  Mouth/Throat: Oropharynx is clear and moist. No oropharyngeal exudate.  Cardiovascular: Normal rate, regular rhythm and normal heart sounds. Exam reveals no gallop and no friction rub.  No murmur heard.  Pulmonary/Chest: Effort normal and breath sounds normal. No respiratory distress. He has no wheezes.  Abdominal: Soft. Bowel sounds are normal. He exhibits no distension. There is no tenderness.  Lymphadenopathy:  He has no cervical adenopathy.  Neurological: He is alert and oriented to person, place, and time.  KKO:ECXF heel full thickness wound mild exudate at wound bed Skin: Skin is warm and dry. No rash noted. No erythema.  Psychiatric: He has a normal mood and affect. His behavior is normal.    CBC Lab Results  Component Value Date   WBC 6.8 08/09/2019   RBC 4.24 08/09/2019   HGB 12.4 (L) 08/09/2019   HCT 40.0 08/09/2019   PLT 310 08/09/2019   MCV 94.3 08/09/2019   MCH 29.2 08/09/2019   MCHC 31.0 08/09/2019   RDW 13.5 08/09/2019   LYMPHSABS 1.5 08/09/2019   MONOABS 0.7 08/09/2019   EOSABS 0.6 (H) 08/09/2019    BMET Lab Results  Component Value Date   NA 138 08/09/2019   K 4.2 08/09/2019   CL 101 08/09/2019   CO2 28 08/09/2019   GLUCOSE 103 (H) 08/09/2019   BUN 13 08/09/2019   CREATININE 1.21 08/09/2019   CALCIUM 8.5 (L) 08/09/2019   GFRNONAA 55 (L) 08/09/2019   GFRAA >60 08/09/2019      Assessment and Plan  Heel osteomyelitis = plan on 6 wk with vancomycin plus ceftiraxone 2gm IV daily. Will  renally dose his vanomcycin. Will check sed rate and crp, at end of treatment to see that he is improving.

## 2019-08-10 NOTE — Telephone Encounter (Signed)
Faxed referral to [facility] for IV antibiotics per Dr. Baxter Flattery. PICC placement and first dose pending. Consuelo Pandy

## 2019-08-10 NOTE — Progress Notes (Addendum)
Pat for TURB in am- PT/INR 16.6/1.4-  Dr Alyson Ingles is aware and is okay to proceed with surgery.

## 2019-08-11 ENCOUNTER — Encounter (HOSPITAL_COMMUNITY)
Admission: RE | Disposition: A | Discharge status: Home / Self Care | Payer: Self-pay | Source: Home / Self Care | Attending: Urology

## 2019-08-11 ENCOUNTER — Ambulatory Visit (HOSPITAL_COMMUNITY)
Admission: RE | Admit: 2019-08-11 | Discharge: 2019-08-11 | Disposition: A | Discharge status: Home / Self Care | Payer: Medicare Other | Attending: Urology | Admitting: Urology

## 2019-08-11 ENCOUNTER — Ambulatory Visit (HOSPITAL_COMMUNITY): Payer: Medicare Other | Admitting: Anesthesiology

## 2019-08-11 ENCOUNTER — Ambulatory Visit (HOSPITAL_COMMUNITY): Payer: Medicare Other

## 2019-08-11 DIAGNOSIS — D494 Neoplasm of unspecified behavior of bladder: Secondary | ICD-10-CM | POA: Diagnosis not present

## 2019-08-11 DIAGNOSIS — N3289 Other specified disorders of bladder: Secondary | ICD-10-CM | POA: Diagnosis not present

## 2019-08-11 DIAGNOSIS — R310 Gross hematuria: Secondary | ICD-10-CM | POA: Diagnosis not present

## 2019-08-11 DIAGNOSIS — M1990 Unspecified osteoarthritis, unspecified site: Secondary | ICD-10-CM | POA: Diagnosis not present

## 2019-08-11 DIAGNOSIS — I10 Essential (primary) hypertension: Secondary | ICD-10-CM | POA: Diagnosis not present

## 2019-08-11 DIAGNOSIS — C672 Malignant neoplasm of lateral wall of bladder: Secondary | ICD-10-CM | POA: Diagnosis not present

## 2019-08-11 DIAGNOSIS — G4730 Sleep apnea, unspecified: Secondary | ICD-10-CM | POA: Insufficient documentation

## 2019-08-11 DIAGNOSIS — Z79899 Other long term (current) drug therapy: Secondary | ICD-10-CM | POA: Insufficient documentation

## 2019-08-11 DIAGNOSIS — C671 Malignant neoplasm of dome of bladder: Secondary | ICD-10-CM | POA: Diagnosis not present

## 2019-08-11 DIAGNOSIS — N3090 Cystitis, unspecified without hematuria: Secondary | ICD-10-CM | POA: Diagnosis not present

## 2019-08-11 DIAGNOSIS — R9431 Abnormal electrocardiogram [ECG] [EKG]: Secondary | ICD-10-CM | POA: Diagnosis not present

## 2019-08-11 DIAGNOSIS — I480 Paroxysmal atrial fibrillation: Secondary | ICD-10-CM | POA: Diagnosis not present

## 2019-08-11 DIAGNOSIS — E785 Hyperlipidemia, unspecified: Secondary | ICD-10-CM | POA: Insufficient documentation

## 2019-08-11 DIAGNOSIS — I69354 Hemiplegia and hemiparesis following cerebral infarction affecting left non-dominant side: Secondary | ICD-10-CM | POA: Insufficient documentation

## 2019-08-11 DIAGNOSIS — I4891 Unspecified atrial fibrillation: Secondary | ICD-10-CM | POA: Diagnosis not present

## 2019-08-11 DIAGNOSIS — N3020 Other chronic cystitis without hematuria: Secondary | ICD-10-CM | POA: Diagnosis not present

## 2019-08-11 HISTORY — PX: TRANSURETHRAL RESECTION OF BLADDER TUMOR: SHX2575

## 2019-08-11 HISTORY — PX: CYSTOSCOPY W/ RETROGRADES: SHX1426

## 2019-08-11 LAB — C-REACTIVE PROTEIN: CRP: 14.5 mg/L — ABNORMAL HIGH (ref <8.0)

## 2019-08-11 LAB — SEDIMENTATION RATE: Sed Rate: 46 mm/h — ABNORMAL HIGH (ref 0–20)

## 2019-08-11 LAB — CK: Total CK: 25 U/L — ABNORMAL LOW (ref 44–196)

## 2019-08-11 SURGERY — CYSTOSCOPY, WITH RETROGRADE PYELOGRAM
Anesthesia: General | Laterality: Bilateral

## 2019-08-11 MED ORDER — METOCLOPRAMIDE HCL 5 MG/ML IJ SOLN
INTRAMUSCULAR | Status: AC
  Filled 2019-08-11: qty 2

## 2019-08-11 MED ORDER — CEFAZOLIN SODIUM-DEXTROSE 2-4 GM/100ML-% IV SOLN
2.0000 g | INTRAVENOUS | Status: AC
  Administered 2019-08-11: 2 g via INTRAVENOUS

## 2019-08-11 MED ORDER — METOCLOPRAMIDE HCL 5 MG/ML IJ SOLN
INTRAMUSCULAR | Status: DC | PRN
Start: 2019-08-11 — End: 2019-08-11
  Administered 2019-08-11: 10 mg via INTRAVENOUS

## 2019-08-11 MED ORDER — PROPOFOL 10 MG/ML IV BOLUS
INTRAVENOUS | Status: DC | PRN
  Administered 2019-08-11: 100 mg via INTRAVENOUS

## 2019-08-11 MED ORDER — SUGAMMADEX SODIUM 500 MG/5ML IV SOLN
INTRAVENOUS | Status: DC | PRN
  Administered 2019-08-11: 200 mg via INTRAVENOUS

## 2019-08-11 MED ORDER — DEXAMETHASONE SODIUM PHOSPHATE 10 MG/ML IJ SOLN
INTRAMUSCULAR | Status: DC | PRN
  Administered 2019-08-11: 4 mg via INTRAVENOUS

## 2019-08-11 MED ORDER — MEPERIDINE HCL 50 MG/ML IJ SOLN: 6.2500 mg | INTRAMUSCULAR | Status: DC | PRN

## 2019-08-11 MED ORDER — DIATRIZOATE MEGLUMINE 30 % UR SOLN
URETHRAL | Status: AC
  Filled 2019-08-11: qty 100

## 2019-08-11 MED ORDER — DEXAMETHASONE SODIUM PHOSPHATE 10 MG/ML IJ SOLN
INTRAMUSCULAR | Status: AC
  Filled 2019-08-11: qty 1

## 2019-08-11 MED ORDER — FENTANYL CITRATE (PF) 100 MCG/2ML IJ SOLN
INTRAMUSCULAR | Status: DC | PRN
  Administered 2019-08-11 (×2): 50 ug via INTRAVENOUS

## 2019-08-11 MED ORDER — CEFAZOLIN SODIUM-DEXTROSE 2-4 GM/100ML-% IV SOLN
INTRAVENOUS | Status: AC
  Filled 2019-08-11: qty 100

## 2019-08-11 MED ORDER — SODIUM CHLORIDE (PF) 0.9 % IJ SOLN: 50.0000 mg | Freq: Once | INTRAVENOUS | Status: DC

## 2019-08-11 MED ORDER — CHLORHEXIDINE GLUCONATE 0.12 % MT SOLN
OROMUCOSAL | Status: AC
  Filled 2019-08-11: qty 15

## 2019-08-11 MED ORDER — LACTATED RINGERS IV SOLN: Freq: Once | INTRAVENOUS | Status: AC

## 2019-08-11 MED ORDER — STERILE WATER FOR IRRIGATION IR SOLN
Status: DC | PRN
  Administered 2019-08-11: 500 mL

## 2019-08-11 MED ORDER — PROPOFOL 10 MG/ML IV BOLUS
INTRAVENOUS | Status: AC
  Filled 2019-08-11: qty 20

## 2019-08-11 MED ORDER — FENTANYL CITRATE (PF) 100 MCG/2ML IJ SOLN
INTRAMUSCULAR | Status: AC
  Filled 2019-08-11: qty 2

## 2019-08-11 MED ORDER — DIATRIZOATE MEGLUMINE 30 % UR SOLN
URETHRAL | Status: DC | PRN
  Administered 2019-08-11: 100 mL via URETHRAL

## 2019-08-11 MED ORDER — ROCURONIUM BROMIDE 100 MG/10ML IV SOLN
INTRAVENOUS | Status: DC | PRN
  Administered 2019-08-11: 30 mg via INTRAVENOUS
  Administered 2019-08-11: 10 mg via INTRAVENOUS

## 2019-08-11 MED ORDER — HYDROMORPHONE HCL 1 MG/ML IJ SOLN: 0.2500 mg | INTRAMUSCULAR | Status: DC | PRN

## 2019-08-11 MED ORDER — SODIUM CHLORIDE 0.9 % IR SOLN
Status: DC | PRN
  Administered 2019-08-11: 3000 mL

## 2019-08-11 MED ORDER — CHLORHEXIDINE GLUCONATE 0.12 % MT SOLN
15.0000 mL | Freq: Once | OROMUCOSAL | Status: AC
  Administered 2019-08-11: 15 mL via OROMUCOSAL

## 2019-08-11 MED ORDER — ORAL CARE MOUTH RINSE: 15.0000 mL | Freq: Once | OROMUCOSAL | Status: AC

## 2019-08-11 MED ORDER — ROCURONIUM BROMIDE 10 MG/ML (PF) SYRINGE
PREFILLED_SYRINGE | INTRAVENOUS | Status: AC
  Filled 2019-08-11: qty 10

## 2019-08-11 MED ORDER — LIDOCAINE HCL (CARDIAC) PF 50 MG/5ML IV SOSY
PREFILLED_SYRINGE | INTRAVENOUS | Status: DC | PRN
  Administered 2019-08-11: 100 mg via INTRAVENOUS

## 2019-08-11 MED ORDER — LACTATED RINGERS IV SOLN: INTRAVENOUS | Status: DC | PRN

## 2019-08-11 MED ORDER — SUCCINYLCHOLINE CHLORIDE 20 MG/ML IJ SOLN
INTRAMUSCULAR | Status: DC | PRN
  Administered 2019-08-11: 100 mg via INTRAVENOUS

## 2019-08-11 MED ORDER — LIDOCAINE HCL URETHRAL/MUCOSAL 2 % EX GEL
CUTANEOUS | Status: AC
  Filled 2019-08-11: qty 10

## 2019-08-11 SURGICAL SUPPLY — 31 items
BAG DRAIN URO TABLE W/ADPT NS (BAG) ×2 IMPLANT
BAG DRN 8 ADPR NS SKTRN CSTL (BAG) ×1
BAG DRN RND TRDRP ANRFLXCHMBR (UROLOGICAL SUPPLIES) ×1
BAG HAMPER (MISCELLANEOUS) ×2 IMPLANT
BAG URINE DRAIN 2000ML AR STRL (UROLOGICAL SUPPLIES) ×2 IMPLANT
CATH FOLEY 3WAY 30CC 22F (CATHETERS) ×1 IMPLANT
CATH FOLEY LATEX FREE 22FR (CATHETERS)
CATH FOLEY LF 22FR (CATHETERS) IMPLANT
CATH INTERMIT  6FR 70CM (CATHETERS) ×2 IMPLANT
CLOTH BEACON ORANGE TIMEOUT ST (SAFETY) ×2 IMPLANT
DECANTER SPIKE VIAL GLASS SM (MISCELLANEOUS) ×2 IMPLANT
ELECT LOOP 22F BIPOLAR SML (ELECTROSURGICAL) ×2
ELECT REM PT RETURN 9FT ADLT (ELECTROSURGICAL) ×2
ELECTRODE LOOP 22F BIPOLAR SML (ELECTROSURGICAL) ×1 IMPLANT
ELECTRODE REM PT RTRN 9FT ADLT (ELECTROSURGICAL) ×1 IMPLANT
GLOVE BIO SURGEON STRL SZ8 (GLOVE) ×2 IMPLANT
GLOVE BIOGEL PI IND STRL 7.0 (GLOVE) ×2 IMPLANT
GLOVE BIOGEL PI INDICATOR 7.0 (GLOVE) ×2
GOWN STRL REUS W/TWL LRG LVL3 (GOWN DISPOSABLE) ×2 IMPLANT
GOWN STRL REUS W/TWL XL LVL3 (GOWN DISPOSABLE) ×2 IMPLANT
GUIDEWIRE STR DUAL SENSOR (WIRE) IMPLANT
IV NS IRRIG 3000ML ARTHROMATIC (IV SOLUTION) ×4 IMPLANT
KIT TURNOVER CYSTO (KITS) ×2 IMPLANT
MANIFOLD NEPTUNE II (INSTRUMENTS) ×2 IMPLANT
PACK CYSTO (CUSTOM PROCEDURE TRAY) ×2 IMPLANT
PAD ARMBOARD 7.5X6 YLW CONV (MISCELLANEOUS) ×2 IMPLANT
PLUG CATH AND CAP STER (CATHETERS) ×2 IMPLANT
SYR 30ML LL (SYRINGE) ×2 IMPLANT
SYR TOOMEY 50ML (SYRINGE) ×2 IMPLANT
TOWEL OR 17X26 4PK STRL BLUE (TOWEL DISPOSABLE) ×2 IMPLANT
WATER STERILE IRR 500ML POUR (IV SOLUTION) ×2 IMPLANT

## 2019-08-11 NOTE — Discharge Instructions (Signed)
Message left at office to call patient's daughter [name] regarding one week follow up appointment     [facility], [place], [place] , [place] [address] [place], Adult An indwelling urinary catheter is a thin tube that is put into your bladder. The tube helps to drain pee (urine) out of your body. The tube goes in through your urethra. Your urethra is where pee comes out of your body. Your pee will come out through the catheter, then it will go into a bag (drainage bag). Take good care of your catheter so it will work well. How to wear your catheter and bag Supplies needed  Sticky tape (adhesive tape) or a leg strap.  Alcohol wipe or soap and water (if you use tape).  A clean towel (if you use tape).  Large overnight bag.  Smaller bag (leg bag). Wearing your catheter Attach your catheter to your leg with tape or a leg strap.  Make sure the catheter is not pulled tight.  If a leg strap gets wet, take it off and put on a dry strap.  If you use tape to hold the bag on your leg: 1. Use an alcohol wipe or soap and water to wash your skin where the tape made it sticky before. 2. Use a clean towel to pat-dry that skin. 3. Use new tape to make the bag stay on your leg. Wearing your bags You should have been given a large overnight bag.  You may wear the overnight bag in the day or night.  Always have the overnight bag lower than your bladder.  Do not let the bag touch the floor.  Before you go to sleep, put a clean plastic bag in a wastebasket. Then hang the overnight bag inside the wastebasket. You should also have a smaller leg bag that fits under your clothes.  Always wear the leg bag below your knee.  Do not wear your leg bag at night. How to care for your skin and catheter Supplies needed  A clean washcloth.  Water and mild soap.  A clean towel. Caring for your skin and catheter      Clean the  skin around your catheter every day: 1. Wash your hands with soap and water. 2. Wet a clean washcloth in warm water and mild soap. 3. Clean the skin around your urethra.  If you are male:  Gently spread the folds of skin around your vagina (labia).  With the washcloth in your other hand, wipe the inner side of your labia on each side. Wipe from front to back.  If you are male:  Pull back any skin that covers the end of your penis (foreskin).  With the washcloth in your other hand, wipe your penis in small circles. Start wiping at the tip of your penis, then move away from the catheter.  Move the foreskin back in place, if needed. 4. With your free hand, hold the catheter close to where it goes into your body.  Keep holding the catheter during cleaning so it does not get pulled out. 5. With the washcloth in your other hand, clean the catheter.  Only wipe downward on the catheter.  Do not wipe upward toward your body. Doing this may push germs into your urethra and cause infection. 6. Use a clean towel to pat-dry the catheter and the skin around it. Make sure to wipe off all soap. 7. Wash your hands with  soap and water.  Shower every day. Do not take baths.  Do not use cream, ointment, or lotion on the area where the catheter goes into your body, unless your doctor tells you to.  Do not use powders, sprays, or lotions on your genital area.  Check your skin around the catheter every day for signs of infection. Check for: ? Redness, swelling, or pain. ? Fluid or blood. ? Warmth. ? Pus or a bad smell. How to empty the bag Supplies needed  Rubbing alcohol.  Gauze pad or cotton ball.  Tape or a leg strap. Emptying the bag Pour the pee out of your bag when it is ?- full, or at least 2-3 times a day. Do this for your overnight bag and your leg bag. 1. Wash your hands with soap and water. 2. Separate (detach) the bag from your leg. 3. Hold the bag over the toilet or a  clean pail. Keep the bag lower than your hips and bladder. This is so the pee (urine) does not go back into the tube. 4. Open the pour spout. It is at the bottom of the bag. 5. Empty the pee into the toilet or pail. Do not let the pour spout touch any surface. 6. Put rubbing alcohol on a gauze pad or cotton ball. 7. Use the gauze pad or cotton ball to clean the pour spout. 8. Close the pour spout. 9. Attach the bag to your leg with tape or a leg strap. 10. Wash your hands with soap and water. Follow instructions for cleaning the drainage bag:  From the product maker.  As told by your doctor. How to change the bag Supplies needed  Alcohol wipes.  A clean bag.  Tape or a leg strap. Changing the bag Replace your bag when it starts to leak, smell bad, or look dirty. 1. Wash your hands with soap and water. 2. Separate the dirty bag from your leg. 3. Pinch the catheter with your fingers so that pee does not spill out. 4. Separate the catheter tube from the bag tube where these tubes connect (at the connection valve). Do not let the tubes touch any surface. 5. Clean the end of the catheter tube with an alcohol wipe. Use a different alcohol wipe to clean the end of the bag tube. 6. Connect the catheter tube to the tube of the clean bag. 7. Attach the clean bag to your leg with tape or a leg strap. Do not make the bag tight on your leg. 8. Wash your hands with soap and water. General rules   Never pull on your catheter. Never try to take it out. Doing that can hurt you.  Always wash your hands before and after you touch your catheter or bag. Use a mild, fragrance-free soap. If you do not have soap and water, use hand sanitizer.  Always make sure there are no twists or bends (kinks) in the catheter tube.  Always make sure there are no leaks in the catheter or bag.  Drink enough fluid to keep your pee pale yellow.  Do not take baths, swim, or use a hot tub.  If you are male, wipe  from front to back after you poop (have a bowel movement). Contact a doctor if:  Your pee is cloudy.  Your pee smells worse than usual.  Your catheter gets clogged.  Your catheter leaks.  Your bladder feels full. Get help right away if:  You have redness, swelling, or pain  where the catheter goes into your body.  You have fluid, blood, pus, or a bad smell coming from the area where the catheter goes into your body.  Your skin feels warm where the catheter goes into your body.  You have a fever.  You have pain in your: ? Belly (abdomen). ? Legs. ? Lower back. ? Bladder.  You see blood in the catheter.  Your pee is pink or red.  You feel sick to your stomach (nauseous).  You throw up (vomit).  You have chills.  Your pee is not draining into the bag.  Your catheter gets pulled out. Summary  An indwelling urinary catheter is a thin tube that is placed into the bladder to help drain pee (urine) out of the body.  The catheter is placed into the part of the body that drains pee from the bladder (urethra).  Taking good care of your catheter will keep it working properly and help prevent problems.  Always wash your hands before and after touching your catheter or bag.  Never pull on your catheter or try to take it out. This information is not intended to replace advice given to you by your health care provider. Make sure you discuss any questions you have with your health care provider. Document Revised: 04/23/2018 Document Reviewed: 08/15/2016 Elsevier Patient Education  2020 [place] Anesthesia, Adult, Care After This sheet gives you information about how to care for yourself after your procedure. Your health care provider may also give you more specific instructions. If you have problems or questions, contact your health care provider. What can I expect after the procedure? After the procedure, the following side effects are common:  Pain  or discomfort at the IV site.  Nausea.  Vomiting.  Sore throat.  Trouble concentrating.  Feeling cold or chills.  Weak or tired.  Sleepiness and fatigue.  Soreness and body aches. These side effects can affect parts of the body that were not involved in surgery. Follow these instructions at home:  For at least 24 hours after the procedure:  Have a responsible adult stay with you. It is important to have someone help care for you until you are awake and alert.  Rest as needed.  Do not: ? Participate in activities in which you could fall or become injured. ? Drive. ? Use heavy machinery. ? Drink alcohol. ? Take sleeping pills or medicines that cause drowsiness. ? Make important decisions or sign legal documents. ? Take care of children on your own. Eating and drinking  Follow any instructions from your health care provider about eating or drinking restrictions.  When you feel hungry, start by eating small amounts of foods that are soft and easy to digest (bland), such as toast. Gradually return to your regular diet.  Drink enough fluid to keep your urine pale yellow.  If you vomit, rehydrate by drinking water, juice, or clear broth. General instructions  If you have sleep apnea, surgery and certain medicines can increase your risk for breathing problems. Follow instructions from your health care provider about wearing your sleep device: ? Anytime you are sleeping, including during daytime naps. ? While taking prescription pain medicines, sleeping medicines, or medicines that make you drowsy.  Return to your normal activities as told by your health care provider. Ask your health care provider what activities are safe for you.  Take over-the-counter and prescription medicines only as told by your health care provider.  If  you smoke, do not smoke without supervision.  Keep all follow-up visits as told by your health care provider. This is important. Contact a health  care provider if:  You have nausea or vomiting that does not get better with medicine.  You cannot eat or drink without vomiting.  You have pain that does not get better with medicine.  You are unable to pass urine.  You develop a skin rash.  You have a fever.  You have redness around your IV site that gets worse. Get help right away if:  You have difficulty breathing.  You have chest pain.  You have blood in your urine or stool, or you vomit blood. Summary  After the procedure, it is common to have a sore throat or nausea. It is also common to feel tired.  Have a responsible adult stay with you for the first 24 hours after general anesthesia. It is important to have someone help care for you until you are awake and alert.  When you feel hungry, start by eating small amounts of foods that are soft and easy to digest (bland), such as toast. Gradually return to your regular diet.  Drink enough fluid to keep your urine pale yellow.  Return to your normal activities as told by your health care provider. Ask your health care provider what activities are safe for you. This information is not intended to replace advice given to you by your health care provider. Make sure you discuss any questions you have with your health care provider. Document Revised: 01/02/2017 Document Reviewed: 08/15/2016 Elsevier Patient Education  [place].

## 2019-08-11 NOTE — Anesthesia Preprocedure Evaluation (Addendum)
Anesthesia Evaluation  Patient identified by MRN, date of birth, ID band Patient awake    Reviewed: Allergy & Precautions, NPO status , Patient's Chart, lab work & pertinent test results  History of Anesthesia Complications Negative for: history of anesthetic complications  Airway Mallampati: II  TM Distance: >3 FB Neck ROM: Full    Dental  (+) Edentulous Upper, Edentulous Lower   Pulmonary sleep apnea ,    Pulmonary exam normal breath sounds clear to auscultation       Cardiovascular Exercise Tolerance: Poor hypertension, Pt. on medications + dysrhythmias (prolonged QT)  Rhythm:Irregular Rate:Bradycardia  11-Aug-2019 13:36:11 [facility]-300 ROUTINE RECORD Atrial fibrillation Left axis deviation Prolonged QT Abnormal ECG   Neuro/Psych CVA, Residual Symptoms    GI/Hepatic   Endo/Other    Renal/GU Renal InsufficiencyRenal disease     Musculoskeletal  (+) Arthritis ,   Abdominal   Peds  Hematology   Anesthesia Other Findings 1. Left ventricular ejection fraction, by estimation, is 40 to 45%. The  left ventricle has mildly decreased function. The left ventricle  demonstrates global hypokinesis. The left ventricular internal cavity size  was mildly dilated. Left ventricular  diastolic parameters are consistent with Grade I diastolic dysfunction  (impaired relaxation).  2. Right ventricular systolic function is normal. The right ventricular  size is normal.  3. The mitral valve is grossly normal. Trivial mitral valve regurgitation. No evidence of mitral stenosis.  4. The aortic valve is normal in structure. Aortic valve regurgitation is  not visualized. No aortic stenosis is present  Reproductive/Obstetrics                           Anesthesia Physical Anesthesia Plan  ASA: IV  Anesthesia Plan: General   Post-op Pain Management:    Induction: Intravenous  PONV  Risk Score and Plan: Dexamethasone  Airway Management Planned: Oral ETT  Additional Equipment:   Intra-op Plan:   Post-operative Plan: Extubation in OR  Informed Consent:   Plan Discussed with: CRNA and Surgeon  Anesthesia Plan Comments:       Anesthesia Quick Evaluation

## 2019-08-11 NOTE — Transfer of Care (Signed)
Immediate Anesthesia Transfer of Care Note  Patient: Curtis Jenkins  Procedure(s) Performed: CYSTOSCOPY WITH RETROGRADE PYELOGRAM (Bilateral ) TRANSURETHRAL RESECTION OF BLADDER TUMOR (TURBT) (Bilateral )  Patient Location: PACU  Anesthesia Type:General  Level of Consciousness: awake, alert , oriented, drowsy and patient cooperative  Airway & Oxygen Therapy: Patient Spontanous Breathing and Patient connected to face mask oxygen  Post-op Assessment: Report given to RN, Post -op Vital signs reviewed and stable and Patient moving all extremities  Post vital signs: Reviewed and stable  Last Vitals:  Vitals Value Taken Time  BP 148/76 08/11/19 1545  Temp 36.3 C 08/11/19 1545  Pulse 33 08/11/19 1547  Resp 17 08/11/19 1547  SpO2 100 % 08/11/19 1547  Vitals shown include unvalidated device data.  Last Pain:  Vitals:   08/11/19 1346  TempSrc: Oral  PainSc: 0-No pain      Patients Stated Pain Goal: 7 (24/82/50 0370)  Complications: No complications documented.

## 2019-08-11 NOTE — H&P (Signed)
Urology Admission H&P  Chief Complaint: gross hematuria and bladder tumor  History of Present Illness: Curtis Jenkins is a 84yo with a hx of gross hematuria who was found to have a bladder tumor on office cystoscopy. He denies any worsening LUTS  Past Medical History:  Diagnosis Date  . Arthritis   . Hyperlipidemia   . Hypertension   . Sleep apnea   . Stroke [facility])    left sided weakness   Past Surgical History:  Procedure Laterality Date  . BACK SURGERY    . left knee surgery    . LOOP RECORDER INSERTION N/A 04/12/2019   Procedure: LOOP RECORDER INSERTION;  Surgeon: Evans Lance, MD;  Location: [place] [facility];  Service: Cardiovascular;  Laterality: N/A;    Home Medications:  Current Facility-Administered Medications  Medication Dose Route Frequency Provider Last Rate Last Admin  . ceFAZolin (ANCEF) IVPB 2g/100 mL premix  2 g Intravenous 30 min Pre-Op Erykah Lippert, Candee Furbish, MD      . epirubicin (ELLENCE) 50 mg in sodium chloride (PF) 0.9 % bladder instillation  50 mg Bladder Instillation Once Alyson Ingles Candee Furbish, MD       Allergies: No Known Allergies  No family history on file. Social History:  reports that he has never smoked. His smokeless tobacco use includes chew. He reports previous alcohol use. He reports that he does not use drugs.  Review of Systems  Genitourinary: Positive for hematuria.  All other systems reviewed and are negative.   Physical Exam:  Vital signs in last 24 hours: Temp:  [98 F (36.7 C)] 98 F (36.7 C) (07/29 1346) Pulse Rate:  [67-68] 67 (07/29 1346) Resp:  [15] 15 (07/29 1346) BP: (123-144)/(70-75) 123/70 (07/29 1346) SpO2:  [97 %-98 %] 97 % (07/29 1346) Physical Exam Vitals reviewed.  Constitutional:      Appearance: Normal appearance.  HENT:     Head: Normocephalic and atraumatic.     Nose: Nose normal.  Eyes:     Extraocular Movements: Extraocular movements intact.  Cardiovascular:     Rate and Rhythm: Normal rate and regular  rhythm.  Pulmonary:     Effort: Pulmonary effort is normal. No respiratory distress.  Abdominal:     General: Abdomen is flat. There is no distension.  Musculoskeletal:        General: No swelling. Normal range of motion.     Cervical back: Normal range of motion and neck supple.  Skin:    General: Skin is warm and dry.  Neurological:     General: No focal deficit present.     Mental Status: He is alert and oriented to person, place, and time.  Psychiatric:        Mood and Affect: Mood normal.        Behavior: Behavior normal.        Thought Content: Thought content normal.        Judgment: Judgment normal.     Laboratory Data:  Results for orders placed or performed in visit on 08/10/19 (from the past 24 hour(s))  C-reactive protein     Status: Abnormal   Collection Time: 08/10/19  3:55 PM  Result Value Ref Range   CRP 14.5 (H) <8.0 mg/L  Sedimentation rate     Status: Abnormal   Collection Time: 08/10/19  3:55 PM  Result Value Ref Range   Sed Rate 46 (H) 0 - 20 mm/h  CK (Creatine Kinase)     Status: Abnormal  Collection Time: 08/10/19  3:55 PM  Result Value Ref Range   Total CK 25 (L) 44.0 - 196.0 U/L   Recent Results (from the past 240 hour(s))  SARS CORONAVIRUS 2 (TAT 6-24 HRS) Nasopharyngeal Nasopharyngeal Swab     Status: None   Collection Time: 08/09/19  1:04 PM   Specimen: Nasopharyngeal Swab  Result Value Ref Range Status   SARS Coronavirus 2 NEGATIVE NEGATIVE Final    Comment: (NOTE) SARS-CoV-2 target nucleic acids are NOT DETECTED.  The SARS-CoV-2 RNA is generally detectable in upper and lower respiratory specimens during the acute phase of infection. Negative results do not preclude SARS-CoV-2 infection, do not rule out co-infections with other pathogens, and should not be used as the sole basis for treatment or other patient management decisions. Negative results must be combined with clinical observations, patient history, and epidemiological  information. The expected result is Negative.  Fact Sheet for Patients: SugarRoll.be  Fact Sheet for Healthcare Providers: https://www.woods-mathews.com/  This test is not yet approved or cleared by the [place] FDA and  has been authorized for detection and/or diagnosis of SARS-CoV-2 by FDA under an Emergency Use Authorization (EUA). This EUA will remain  in effect (meaning this test can be used) for the duration of the COVID-19 declaration under Se ction 564(b)(1) of the Act, 21 U.S.C. section 360bbb-3(b)(1), unless the authorization is terminated or revoked sooner.  Performed at [facility], [address] [place], [place] [address]    Creatinine: Recent Labs    08/09/19 1315  CREATININE 1.21   Baseline Creatinine: 1.2  Impression/Assessment:  84yo with a bladder tumor  Plan:  The risks/benefits/alterantives to bladder tumor resection and bilateral retrograde pyelograms was explained to the patient and he understands and wishes to proceed with surgery  Nicolette Bang 08/11/2019, 2:21 PM

## 2019-08-11 NOTE — Op Note (Signed)
.  Preoperative diagnosis: bladder tumor  Postoperative diagnosis: Same  Procedure: 1 cystoscopy 2. Left retrograde pyelography 3.  Intraoperative fluoroscopy, under one hour, with interpretation 4.  Clot evacuation 5. Transurethral resection of bladder tumor, small  Attending: Rosie Fate  Anesthesia: General  Estimated blood loss: Minimal  Drains: 22 French foley  Specimens: bladder tumor  Antibiotics: ancef  Findings:  50cc clot in the bladder. 1cm sessile left lateral wall/dome tumor.  Left ureteral orifice identified. Right ureteral orifice unable to identify. No hydronephrosis or filling defects in left collecting system  Indications: Patient is a 84 year old male with a history of bladder tumor and gross hematuria.  After discussing treatment options, they decided proceed with transurethral resection of a bladder tumor.  Procedure her in detail: The patient was brought to the operating room and a brief timeout was done to ensure correct patient, correct procedure, correct site.  General anesthesia was administered patient was placed in dorsal lithotomy position.  Their genitalia was then prepped and draped in usual sterile fashion.  A rigid 16 French cystoscope was passed in the urethra and the bladder.  Bladder was inspected and we noted a clot and a 1cm bladder tumor.  the left ureteral orifice was in the normal orthotopic locations.  a 6 french ureteral catheter was then instilled into the left ureteral orifice.  a gentle retrograde was obtained and findings noted above. We then turned our attention to the right side but we were unable to identify the left ureteral orifice.  We then removed the cystoscope and placed a resectoscope into the bladder. We proceeded to remove the clot burden from the bladder. Once this was complete we turned our attention to the bladder tumor. Using the bipolar resectoscope we removed the bladder tumor down to the base. Hemostasis was then obtained  with electrocautery. We then removed the bladder tumor chips and sent them for pathology. We then re-inspected the bladder and found no residula bleeding.  the bladder was then drained, a 22 French foley was placed and this concluded the procedure which was well tolerated by patient.  Complications: None  Condition: Stable, extubated, transferred to PACU  Plan: Patient is to be discharged home and followup in 5 days for foley catheter removal and pathology discussion.

## 2019-08-11 NOTE — Anesthesia Postprocedure Evaluation (Signed)
Anesthesia Post Note  Patient: Curtis Jenkins  Procedure(s) Performed: CYSTOSCOPY WITH RETROGRADE PYELOGRAM (Bilateral ) TRANSURETHRAL RESECTION OF BLADDER TUMOR (TURBT) (Bilateral )  Patient location during evaluation: PACU Anesthesia Type: General Level of consciousness: awake, oriented, awake and alert, patient cooperative and confused Pain management: pain level controlled Vital Signs Assessment: post-procedure vital signs reviewed and stable Respiratory status: spontaneous breathing, respiratory function stable and nonlabored ventilation Cardiovascular status: blood pressure returned to baseline and stable Postop Assessment: no headache and no backache Anesthetic complications: no   No complications documented.   Last Vitals:  Vitals:   08/11/19 1346 08/11/19 1545  BP: 123/70 (!) 148/76  Pulse: 67 64  Resp: 15 15  Temp: 36.7 C (!) 36.3 C  SpO2: 97% 100%    Last Pain:  Vitals:   08/11/19 1545  TempSrc:   PainSc: 0-No pain                 Tacy Learn

## 2019-08-11 NOTE — Anesthesia Procedure Notes (Signed)
Procedure Name: Intubation Performed by: Tacy Learn, CRNA Pre-anesthesia Checklist: Patient identified, Emergency Drugs available, Suction available, Patient being monitored and Timeout performed Patient Re-evaluated:Patient Re-evaluated prior to induction Oxygen Delivery Method: Circle system utilized Preoxygenation: Pre-oxygenation with 100% oxygen Induction Type: IV induction Laryngoscope Size: Miller and 2 Grade View: Grade I Tube size: 7.5 mm Number of attempts: 1 Airway Equipment and Method: Stylet Placement Confirmation: ETT inserted through vocal cords under direct vision,  positive ETCO2,  CO2 detector and breath sounds checked- equal and bilateral Secured at: 23 cm Tube secured with: Tape Dental Injury: Teeth and Oropharynx as per pre-operative assessment

## 2019-08-12 ENCOUNTER — Ambulatory Visit (HOSPITAL_COMMUNITY)
Admission: RE | Admit: 2019-08-12 | Discharge: 2019-08-12 | Disposition: A | Discharge status: Home / Self Care | Payer: Medicare Other | Source: Ambulatory Visit | Attending: Internal Medicine | Admitting: Internal Medicine

## 2019-08-12 ENCOUNTER — Encounter (HOSPITAL_COMMUNITY)
Admission: RE | Admit: 2019-08-12 | Discharge: 2019-08-12 | Disposition: A | Discharge status: Home / Self Care | Payer: Medicare Other | Source: Ambulatory Visit | Attending: Internal Medicine | Admitting: Internal Medicine

## 2019-08-12 ENCOUNTER — Telehealth: Payer: Self-pay

## 2019-08-12 ENCOUNTER — Encounter (HOSPITAL_COMMUNITY): Payer: Self-pay | Admitting: Urology

## 2019-08-12 ENCOUNTER — Other Ambulatory Visit: Payer: Self-pay

## 2019-08-12 DIAGNOSIS — M86672 Other chronic osteomyelitis, left ankle and foot: Secondary | ICD-10-CM | POA: Diagnosis not present

## 2019-08-12 DIAGNOSIS — Z792 Long term (current) use of antibiotics: Secondary | ICD-10-CM | POA: Insufficient documentation

## 2019-08-12 DIAGNOSIS — Z452 Encounter for adjustment and management of vascular access device: Secondary | ICD-10-CM | POA: Diagnosis not present

## 2019-08-12 MED ORDER — SODIUM CHLORIDE 0.9 % IV SOLN
2.0000 g | Freq: Once | INTRAVENOUS | Status: AC
  Administered 2019-08-12: 2 g via INTRAVENOUS
  Filled 2019-08-12: qty 2

## 2019-08-12 MED ORDER — HEPARIN SOD (PORK) LOCK FLUSH 100 UNIT/ML IV SOLN
INTRAVENOUS | Status: AC
  Filled 2019-08-12: qty 5

## 2019-08-12 MED ORDER — HEPARIN SOD (PORK) LOCK FLUSH 100 UNIT/ML IV SOLN
250.0000 [IU] | INTRAVENOUS | Status: AC | PRN
  Administered 2019-08-12: 250 [IU]

## 2019-08-12 MED ORDER — VANCOMYCIN HCL IN DEXTROSE 1-5 GM/200ML-% IV SOLN
1000.0000 mg | Freq: Once | INTRAVENOUS | Status: AC
  Administered 2019-08-12: 1000 mg via INTRAVENOUS
  Filled 2019-08-12: qty 200

## 2019-08-12 MED ORDER — LIDOCAINE HCL 1 % IJ SOLN
INTRAMUSCULAR | Status: AC
  Filled 2019-08-12: qty 20

## 2019-08-12 NOTE — Procedures (Signed)
PROCEDURE SUMMARY:  Successful placement of image-guided single lumen PICC line to the right brachial vein. Length 34 cm. Tip at lower SVC/RA. No complications. EBL = < 5 ml Ready for use.  Please see imaging section of Epic for full dictation.   Theresa Duty, NP 08/12/2019 9:46 AM

## 2019-08-12 NOTE — Telephone Encounter (Signed)
Per Stanton Kidney with [facility] needs new orders called or faxed for IV therapy. Eugenia Mcalpine

## 2019-08-13 DIAGNOSIS — I639 Cerebral infarction, unspecified: Secondary | ICD-10-CM | POA: Diagnosis not present

## 2019-08-13 DIAGNOSIS — R1310 Dysphagia, unspecified: Secondary | ICD-10-CM | POA: Diagnosis not present

## 2019-08-13 DIAGNOSIS — L8990 Pressure ulcer of unspecified site, unspecified stage: Secondary | ICD-10-CM | POA: Diagnosis not present

## 2019-08-13 DIAGNOSIS — M25422 Effusion, left elbow: Secondary | ICD-10-CM | POA: Diagnosis not present

## 2019-08-13 DIAGNOSIS — M86172 Other acute osteomyelitis, left ankle and foot: Secondary | ICD-10-CM | POA: Diagnosis not present

## 2019-08-14 DIAGNOSIS — M86172 Other acute osteomyelitis, left ankle and foot: Secondary | ICD-10-CM | POA: Diagnosis not present

## 2019-08-14 DIAGNOSIS — I639 Cerebral infarction, unspecified: Secondary | ICD-10-CM | POA: Diagnosis not present

## 2019-08-14 DIAGNOSIS — M25422 Effusion, left elbow: Secondary | ICD-10-CM | POA: Diagnosis not present

## 2019-08-15 ENCOUNTER — Encounter: Payer: Self-pay | Admitting: Internal Medicine

## 2019-08-15 DIAGNOSIS — M25422 Effusion, left elbow: Secondary | ICD-10-CM | POA: Diagnosis not present

## 2019-08-15 DIAGNOSIS — L89629 Pressure ulcer of left heel, unspecified stage: Secondary | ICD-10-CM | POA: Diagnosis not present

## 2019-08-15 DIAGNOSIS — N182 Chronic kidney disease, stage 2 (mild): Secondary | ICD-10-CM | POA: Diagnosis not present

## 2019-08-15 DIAGNOSIS — I69954 Hemiplegia and hemiparesis following unspecified cerebrovascular disease affecting left non-dominant side: Secondary | ICD-10-CM | POA: Diagnosis not present

## 2019-08-15 DIAGNOSIS — I129 Hypertensive chronic kidney disease with stage 1 through stage 4 chronic kidney disease, or unspecified chronic kidney disease: Secondary | ICD-10-CM | POA: Diagnosis not present

## 2019-08-15 DIAGNOSIS — I639 Cerebral infarction, unspecified: Secondary | ICD-10-CM | POA: Diagnosis not present

## 2019-08-15 DIAGNOSIS — M86172 Other acute osteomyelitis, left ankle and foot: Secondary | ICD-10-CM | POA: Diagnosis not present

## 2019-08-15 NOTE — Telephone Encounter (Signed)
Verbal orders given to [facility] on 08/12/19 per Dr Baxter Flattery; Vancomycin per protocol, ceftriaxone 2 gm daily x 6 weeks.

## 2019-08-16 DIAGNOSIS — I639 Cerebral infarction, unspecified: Secondary | ICD-10-CM | POA: Diagnosis not present

## 2019-08-16 DIAGNOSIS — M86172 Other acute osteomyelitis, left ankle and foot: Secondary | ICD-10-CM | POA: Diagnosis not present

## 2019-08-16 DIAGNOSIS — M25422 Effusion, left elbow: Secondary | ICD-10-CM | POA: Diagnosis not present

## 2019-08-16 LAB — SURGICAL PATHOLOGY

## 2019-08-17 ENCOUNTER — Other Ambulatory Visit: Payer: Self-pay

## 2019-08-17 ENCOUNTER — Encounter: Payer: Self-pay | Admitting: Urology

## 2019-08-17 ENCOUNTER — Ambulatory Visit (INDEPENDENT_AMBULATORY_CARE_PROVIDER_SITE_OTHER): Payer: Medicare Other | Admitting: Urology

## 2019-08-17 VITALS — BP 108/71 | HR 71 | Temp 99.8°F | Ht 71.0 in | Wt 190.0 lb

## 2019-08-17 DIAGNOSIS — Z8546 Personal history of malignant neoplasm of prostate: Secondary | ICD-10-CM | POA: Diagnosis not present

## 2019-08-17 DIAGNOSIS — M86172 Other acute osteomyelitis, left ankle and foot: Secondary | ICD-10-CM | POA: Diagnosis not present

## 2019-08-17 DIAGNOSIS — M25422 Effusion, left elbow: Secondary | ICD-10-CM | POA: Diagnosis not present

## 2019-08-17 DIAGNOSIS — I639 Cerebral infarction, unspecified: Secondary | ICD-10-CM | POA: Diagnosis not present

## 2019-08-17 LAB — BLADDER SCAN AMB NON-IMAGING: Scan Result: 142.4

## 2019-08-17 NOTE — Progress Notes (Signed)
Urological Symptom Review  Patient is experiencing the following symptoms: Frequent urination Hard to postpone urination Get up at night to urinate   Review of Systems  Gastrointestinal (upper)  : Negative for upper GI symptoms  Gastrointestinal (lower) : Negative for lower GI symptoms  Constitutional : Negative for symptoms  Skin: Negative for skin symptoms  Eyes: Negative for eye symptoms  Ear/Nose/Throat : Negative for Ear/Nose/Throat symptoms  Hematologic/Lymphatic: Negative for Hematologic/Lymphatic symptoms  Cardiovascular : Negative for cardiovascular symptoms  Respiratory : Negative for respiratory symptoms  Endocrine: Negative for endocrine symptoms  Musculoskeletal: Negative for musculoskeletal symptoms  Neurological: Negative for neurological symptoms  Psychologic: Negative for psychiatric symptoms  

## 2019-08-17 NOTE — Progress Notes (Signed)
Fill and Pull Catheter Removal  Patient is present today for a catheter removal.  Patient was cleaned and prepped in a sterile fashion 118ml of sterile water/ saline was instilled into the bladder when the patient felt the urge to urinate. 150ml of water was then drained from the balloon.  A 18FR foley cath was removed from the bladder no complications were noted .  Patient as then given some time to void on their own.  Patient can void  138ml on their own after some time.  Patient tolerated well.  Performed by: Valentina Lucks  Follow up/ Additional notes:

## 2019-08-17 NOTE — Progress Notes (Signed)
08/17/2019 11:10 AM   Curtis Jenkins September 11, 1935 [id number]  Referring provider: Redmond School, MD [address] [place],  [place] [address]  Followup bladder biopsy  HPI: Mr Curtis Jenkins is a 84yo here for followup after bladder biopsy. Pathology benign. No hematuria. No pelvic pain.    PMH: Past Medical History:  Diagnosis Date  . Arthritis   . Hyperlipidemia   . Hypertension   . Sleep apnea   . Stroke [facility])    left sided weakness    Surgical History: Past Surgical History:  Procedure Laterality Date  . BACK SURGERY    . CYSTOSCOPY W/ RETROGRADES Bilateral 08/11/2019   Procedure: CYSTOSCOPY WITH RETROGRADE PYELOGRAM;  Surgeon: Cleon Gustin, MD;  Location: [facility];  Service: Urology;  Laterality: Bilateral;  . left knee surgery    . LOOP RECORDER INSERTION N/A 04/12/2019   Procedure: LOOP RECORDER INSERTION;  Surgeon: Evans Lance, MD;  Location: [place] [facility];  Service: Cardiovascular;  Laterality: N/A;  . TRANSURETHRAL RESECTION OF BLADDER TUMOR Bilateral 08/11/2019   Procedure: TRANSURETHRAL RESECTION OF BLADDER TUMOR (TURBT);  Surgeon: Cleon Gustin, MD;  Location: [facility];  Service: Urology;  Laterality: Bilateral;    Home Medications:  Allergies as of 08/17/2019   No Known Allergies     Medication List       Accurate as of August 17, 2019 11:10 AM. If you have any questions, ask your nurse or doctor.        amLODipine 5 MG tablet Commonly known as: NORVASC Take 5 mg by mouth daily.   calcium-vitamin D 500-200 MG-UNIT tablet Commonly known as: OSCAL WITH D Take 1 tablet by mouth 2 (two) times daily.   cefTRIAXone  IVPB Commonly known as: ROCEPHIN Inject 2 g into the vein. 2g in Sodium Chloride   100  ml   clotrimazole-betamethasone cream Commonly known as: LOTRISONE Apply 1 application topically 2 (two) times daily as needed (irritation/rash.).   Eliquis 5 MG Tabs tablet Generic drug: apixaban Take 2.5 mg by mouth 2 (two) times  daily.   feeding supplement (ENSURE ENLIVE) Liqd Take 237 mLs by mouth 3 (three) times daily after meals. What changed: when to take this   Fiber Therapy 500 MG Tabs Generic drug: Methylcellulose (Laxative) Take 500 mg by mouth in the morning and at bedtime.   Magnesium 200 MG Tabs Take 200 mg by mouth in the morning and at bedtime.   rosuvastatin 20 MG tablet Commonly known as: CRESTOR Take 20 mg by mouth every evening.   traMADol 50 MG tablet Commonly known as: ULTRAM Take 50 mg by mouth 3 (three) times daily as needed (pain.).   vancomycin  IVPB Inject into the vein.   zolpidem 10 MG tablet Commonly known as: AMBIEN Take 10 mg by mouth at bedtime.       Allergies: No Known Allergies  Family History: No family history on file.  Social History:  reports that he has never smoked. His smokeless tobacco use includes chew. He reports previous alcohol use. He reports that he does not use drugs.  ROS: All other review of systems were reviewed and are negative except what is noted above in HPI  Physical Exam: BP 108/71   Pulse 71   Temp 99.8 F (37.7 C)   Ht 5\' 11"  (1.803 m)   Wt 190 lb (86.2 kg)   BMI 26.50 kg/m   Constitutional:  Alert and oriented, No acute distress. HEENT: [place] AT, moist mucus  membranes.  Trachea midline, no masses. Cardiovascular: No clubbing, cyanosis, or edema. Respiratory: Normal respiratory effort, no increased work of breathing. GI: Abdomen is soft, nontender, nondistended, no abdominal masses GU: No CVA tenderness.  Lymph: No cervical or inguinal lymphadenopathy. Skin: No rashes, bruises or suspicious lesions. Neurologic: Grossly intact, no focal deficits, moving all 4 extremities. Psychiatric: Normal mood and affect.  Laboratory Data: Lab Results  Component Value Date   WBC 6.8 08/09/2019   HGB 12.4 (L) 08/09/2019   HCT 40.0 08/09/2019   MCV 94.3 08/09/2019   PLT 310 08/09/2019    Lab Results  Component Value Date    CREATININE 1.21 08/09/2019    Lab Results  Component Value Date   PSA 0.5 05/20/2019    No results found for: TESTOSTERONE  Lab Results  Component Value Date   HGBA1C 6.3 (H) 04/03/2019    Urinalysis    Component Value Date/Time   COLORURINE YELLOW 05/20/2019 1212   APPEARANCEUR HAZY (A) 05/20/2019 1212   LABSPEC 1.014 05/20/2019 1212   PHURINE 7.0 05/20/2019 1212   GLUCOSEU NEGATIVE 05/20/2019 1212   HGBUR SMALL (A) 05/20/2019 1212   BILIRUBINUR NEGATIVE 05/20/2019 1212   BILIRUBINUR negative 05/20/2019 1137   KETONESUR NEGATIVE 05/20/2019 1212   PROTEINUR NEGATIVE 05/20/2019 1212   PROTEINUR Positive (A) 05/20/2019 1137   UROBILINOGEN 0.2 05/20/2019 1137   UROBILINOGEN 0.2 04/11/2010 1329   NITRITE NEGATIVE 05/20/2019 1212   NITRITE negative 05/20/2019 1137   LEUKOCYTESUR TRACE (A) 05/20/2019 1212   LEUKOCYTESUR Trace (A) 05/20/2019 1137    Lab Results  Component Value Date   BACTERIA NONE SEEN 05/20/2019    Pertinent Imaging:  Results for orders placed during the hospital encounter of 04/09/19  DG Abd 1 View  Narrative CLINICAL DATA:  NG tube placement  EXAM: ABDOMEN - 1 VIEW  COMPARISON:  April 09, 2019  FINDINGS: The NG tube again projects over the left upper quadrant with the side hole near the GE junction. The bowel gas pattern is nonobstructive and nonspecific. There are degenerative changes of the lumbar spine.  IMPRESSION: NG tube is unchanged in positioning. If possible, further advancement into the stomach is recommended.   Electronically Signed By: Constance Holster M.D. On: 04/09/2019 19:11  No results found for this or any previous visit.  No results found for this or any previous visit.  No results found for this or any previous visit.  Results for orders placed during the hospital encounter of 11/15/09  US Renal  Narrative Clinical Data: History of renal insufficiency.  RENAL/URINARY TRACT ULTRASOUND  COMPLETE  Comparison:  None.  Findings:  Right Kidney:  Right renal length is 9.4 cm.  No right renal mass is evident.  Left Kidney:  Left renal length is 9.0 cm.  A hypoechoic mass in the upper pole of the left kidney has a a few internal echoes.  No internal color Doppler flow was seen.  No wall thickening or solid nodularity or papillary excrescence is evident.  The area measures 2.8 x 2.4 x 2.3 cm.  Examination of both kidneys shows no evidence of solid mass, hydronephrosis, calculus, or parenchymal loss.  There is increased echogenicity of parenchyma both kidneys.  This may indicate medical renal disease.  Bladder:  No intrinsic bladder lesion is identified involving the mucosa.  Ureteral jets are seen.  There is slight thickening of the bladder wall.  This may reflect hypertrophy.  There is enlargement of the prostate with indentation on bladder base  by median lobe of the prostate.  Prostate measured 7 x 5.7 x 4.9 cm.  There is increased echogenicity of liver parenchyma.  No focal lesion is seen.  IMPRESSION: Normal sized kidneys with no hydronephrosis.  Increased echogenicity of parenchyma of kidneys may reflect medical renal disease.  A hypoechoic mass in the upper pole of the left kidney has a a few internal echoes.  No internal color Doppler flow was seen.  No wall thickening or solid nodularity or papillary excrescence is evident. The area measures 2.8 x 2.4 x 2.3 cm.  There may be slight posterior acoustic enhancement but this is not definite.  The mass is  indeterminate at this time.  It is not a simple cyst.  This may reflect a cyst with some debris within it.  Cystic neoplastic process cannot be excluded.  The mass  may be further characterized by MRI.  Follow-up ultrasound in 3 - 6 months may be obtained to evaluate for stability of size.  Enlargement of prostate with indentation of bladder base by median lobe of the prostate.  Bladder wall thickening  may reflect hypertrophy.  Provider: Andres Ege  No results found for this or any previous visit.  No results found for this or any previous visit.  Results for orders placed during the hospital encounter of 05/06/19  CT Renal Stone Study  Narrative CLINICAL DATA:  Hematuria.  EXAM: CT ABDOMEN AND PELVIS WITHOUT CONTRAST  TECHNIQUE: Multidetector CT imaging of the abdomen and pelvis was performed following the standard protocol without IV contrast.  COMPARISON:  Remote renal ultrasound 11/15/2009  FINDINGS: Lower chest: Mild elevation of left hemidiaphragm with adjacent atelectasis/scarring. Small calcifications in the posterior right costophrenic sulcus may be calcified granuloma.  Hepatobiliary: No evidence of focal hepatic abnormality on noncontrast exam. Layering hyperdensity in the gallbladder. No pericholecystic inflammation. No biliary dilatation.  Pancreas: Parenchymal atrophy. No ductal dilatation or inflammation.  Spleen: Normal in size without focal abnormality.  Adrenals/Urinary Tract: Normal adrenal glands. No renal calculi. There is bilateral renal parenchymal thinning. Complex cyst versus adjacent clustered cysts in the posterior mid left kidney measure 3.2 x 2.9 cm. No hydronephrosis. No significant perinephric edema. No obvious solid mass on noncontrast exam. With ureters are decompressed without stones along the course.  Circumferential bladder wall thickening. There is focal nodular thickening of the posterior right urinary trigone, series 2, image 67. Mass effect on the bladder base from enlarged prostate gland. Generalized perivesicular stranding.  Stomach/Bowel: Nondistended stomach. No small bowel obstruction or inflammation. Normal appendix. Moderate stool burden throughout the colon. There is colonic tortuosity particularly involving the sigmoid. Mild sigmoid diverticulosis without diverticulitis. No colonic wall thickening or  inflammation.  Vascular/Lymphatic: Aorto bi-iliac atherosclerosis. No aortic aneurysm. There are small periportal and retroperitoneal lymph nodes are not enlarged by size criteria, nonspecific.  Reproductive: Mildly enlarged prostate gland causes mass effect on the bladder base. Surgical clips within the prostate posteriorly.  Other: Mild presacral edema. No ascites. No free air or focal fluid collection. No significant body wall hernia.  Musculoskeletal: Advanced degenerative disc disease and facet hypertrophy in the lumbar spine. 8 mm anterolisthesis of L3 on L4 with chronic L3 bilateral pars interarticularis defects. No evidence of focal bone lesion.  IMPRESSION: 1. Circumferential bladder wall thickening with perivesicular stranding, cystitis versus chronic in the setting of enlarged prostate gland. 2. Focal nodular thickening of the posterior right urinary trigone. Recommend further evaluation with cystoscopy. 3. No renal stones or obstructive uropathy. Bilateral renal parenchymal  thinning. Complex cyst versus adjacent clustered cysts in the posterior mid left kidney measuring 3.2 x 2.9 cm. A 2.8 cm hypoechoic lesion with seen on remote renal ultrasound 11/15/2009. Lack of significant change in size over the course of 10 years favors a benign etiology, however this is technically indeterminate on noncontrast CT. 4. Mildly enlarged prostate gland causes mass effect on the bladder base. 5. Advanced degenerative disc disease and facet hypertrophy in the lumbar spine. 8 mm anterolisthesis of L3 on L4 with chronic L3 bilateral pars interarticularis defects. 6. Layering hyperdensity in the gallbladder may represent sludge or stones. No pericholecystic inflammation.  Aortic Atherosclerosis (ICD10-I70.0).   Electronically Signed By: Keith Rake M.D. On: 05/06/2019 17:21   Assessment & Plan:    1. History of prostate cancer -RTC 6 months with PSA - Urinalysis,  Routine w reflex microscopic   No follow-ups on file.  Nicolette Bang, MD  [facility] [place]

## 2019-08-18 ENCOUNTER — Ambulatory Visit: Payer: Medicare Other

## 2019-08-18 DIAGNOSIS — M25422 Effusion, left elbow: Secondary | ICD-10-CM | POA: Diagnosis not present

## 2019-08-18 DIAGNOSIS — M86172 Other acute osteomyelitis, left ankle and foot: Secondary | ICD-10-CM | POA: Diagnosis not present

## 2019-08-18 DIAGNOSIS — N182 Chronic kidney disease, stage 2 (mild): Secondary | ICD-10-CM | POA: Diagnosis not present

## 2019-08-18 DIAGNOSIS — R1310 Dysphagia, unspecified: Secondary | ICD-10-CM | POA: Diagnosis not present

## 2019-08-18 DIAGNOSIS — M159 Polyosteoarthritis, unspecified: Secondary | ICD-10-CM | POA: Diagnosis not present

## 2019-08-18 DIAGNOSIS — I69391 Dysphagia following cerebral infarction: Secondary | ICD-10-CM | POA: Diagnosis not present

## 2019-08-18 DIAGNOSIS — I639 Cerebral infarction, unspecified: Secondary | ICD-10-CM | POA: Diagnosis not present

## 2019-08-18 DIAGNOSIS — I129 Hypertensive chronic kidney disease with stage 1 through stage 4 chronic kidney disease, or unspecified chronic kidney disease: Secondary | ICD-10-CM | POA: Diagnosis not present

## 2019-08-18 DIAGNOSIS — I69954 Hemiplegia and hemiparesis following unspecified cerebrovascular disease affecting left non-dominant side: Secondary | ICD-10-CM | POA: Diagnosis not present

## 2019-08-18 DIAGNOSIS — Z720 Tobacco use: Secondary | ICD-10-CM | POA: Diagnosis not present

## 2019-08-18 DIAGNOSIS — L89620 Pressure ulcer of left heel, unstageable: Secondary | ICD-10-CM | POA: Diagnosis not present

## 2019-08-18 DIAGNOSIS — E785 Hyperlipidemia, unspecified: Secondary | ICD-10-CM | POA: Diagnosis not present

## 2019-08-18 DIAGNOSIS — N281 Cyst of kidney, acquired: Secondary | ICD-10-CM

## 2019-08-18 LAB — BLADDER SCAN AMB NON-IMAGING: Scan Result: 96.8

## 2019-08-19 DIAGNOSIS — M25422 Effusion, left elbow: Secondary | ICD-10-CM | POA: Diagnosis not present

## 2019-08-19 DIAGNOSIS — I639 Cerebral infarction, unspecified: Secondary | ICD-10-CM | POA: Diagnosis not present

## 2019-08-19 DIAGNOSIS — M86172 Other acute osteomyelitis, left ankle and foot: Secondary | ICD-10-CM | POA: Diagnosis not present

## 2019-08-20 DIAGNOSIS — M25422 Effusion, left elbow: Secondary | ICD-10-CM | POA: Diagnosis not present

## 2019-08-20 DIAGNOSIS — I639 Cerebral infarction, unspecified: Secondary | ICD-10-CM | POA: Diagnosis not present

## 2019-08-20 DIAGNOSIS — M86172 Other acute osteomyelitis, left ankle and foot: Secondary | ICD-10-CM | POA: Diagnosis not present

## 2019-08-21 DIAGNOSIS — M25422 Effusion, left elbow: Secondary | ICD-10-CM | POA: Diagnosis not present

## 2019-08-21 DIAGNOSIS — I639 Cerebral infarction, unspecified: Secondary | ICD-10-CM | POA: Diagnosis not present

## 2019-08-21 DIAGNOSIS — M86172 Other acute osteomyelitis, left ankle and foot: Secondary | ICD-10-CM | POA: Diagnosis not present

## 2019-08-22 ENCOUNTER — Encounter (HOSPITAL_BASED_OUTPATIENT_CLINIC_OR_DEPARTMENT_OTHER): Payer: Medicare Other | Attending: Internal Medicine | Admitting: Internal Medicine

## 2019-08-22 DIAGNOSIS — I480 Paroxysmal atrial fibrillation: Secondary | ICD-10-CM | POA: Diagnosis not present

## 2019-08-22 DIAGNOSIS — Z923 Personal history of irradiation: Secondary | ICD-10-CM | POA: Diagnosis not present

## 2019-08-22 DIAGNOSIS — Z7901 Long term (current) use of anticoagulants: Secondary | ICD-10-CM | POA: Diagnosis not present

## 2019-08-22 DIAGNOSIS — Z8546 Personal history of malignant neoplasm of prostate: Secondary | ICD-10-CM | POA: Insufficient documentation

## 2019-08-22 DIAGNOSIS — I129 Hypertensive chronic kidney disease with stage 1 through stage 4 chronic kidney disease, or unspecified chronic kidney disease: Secondary | ICD-10-CM | POA: Diagnosis not present

## 2019-08-22 DIAGNOSIS — M869 Osteomyelitis, unspecified: Secondary | ICD-10-CM | POA: Diagnosis not present

## 2019-08-22 DIAGNOSIS — Z8673 Personal history of transient ischemic attack (TIA), and cerebral infarction without residual deficits: Secondary | ICD-10-CM | POA: Insufficient documentation

## 2019-08-22 DIAGNOSIS — L89322 Pressure ulcer of left buttock, stage 2: Secondary | ICD-10-CM | POA: Insufficient documentation

## 2019-08-22 DIAGNOSIS — M159 Polyosteoarthritis, unspecified: Secondary | ICD-10-CM | POA: Diagnosis not present

## 2019-08-22 DIAGNOSIS — N182 Chronic kidney disease, stage 2 (mild): Secondary | ICD-10-CM | POA: Diagnosis not present

## 2019-08-22 DIAGNOSIS — E785 Hyperlipidemia, unspecified: Secondary | ICD-10-CM | POA: Diagnosis not present

## 2019-08-22 DIAGNOSIS — M86672 Other chronic osteomyelitis, left ankle and foot: Secondary | ICD-10-CM | POA: Insufficient documentation

## 2019-08-22 DIAGNOSIS — M86172 Other acute osteomyelitis, left ankle and foot: Secondary | ICD-10-CM | POA: Diagnosis not present

## 2019-08-22 DIAGNOSIS — R1310 Dysphagia, unspecified: Secondary | ICD-10-CM | POA: Diagnosis not present

## 2019-08-22 DIAGNOSIS — I69391 Dysphagia following cerebral infarction: Secondary | ICD-10-CM | POA: Diagnosis not present

## 2019-08-22 DIAGNOSIS — L89620 Pressure ulcer of left heel, unstageable: Secondary | ICD-10-CM | POA: Insufficient documentation

## 2019-08-22 DIAGNOSIS — G603 Idiopathic progressive neuropathy: Secondary | ICD-10-CM | POA: Insufficient documentation

## 2019-08-22 DIAGNOSIS — I69954 Hemiplegia and hemiparesis following unspecified cerebrovascular disease affecting left non-dominant side: Secondary | ICD-10-CM | POA: Diagnosis not present

## 2019-08-22 DIAGNOSIS — M25422 Effusion, left elbow: Secondary | ICD-10-CM | POA: Diagnosis not present

## 2019-08-22 DIAGNOSIS — Z5181 Encounter for therapeutic drug level monitoring: Secondary | ICD-10-CM | POA: Diagnosis not present

## 2019-08-22 DIAGNOSIS — Z720 Tobacco use: Secondary | ICD-10-CM | POA: Diagnosis not present

## 2019-08-22 DIAGNOSIS — I639 Cerebral infarction, unspecified: Secondary | ICD-10-CM | POA: Diagnosis not present

## 2019-08-22 NOTE — Progress Notes (Signed)
Curtis Jenkins, Curtis Jenkins ([id number]) Visit Report for 08/22/2019 Arrival Information Details Patient Name: Date of Service: MA Curtis Jenkins [place] RD G. 08/22/2019 1:00 PM Medical Record Number: [id number] Patient Account Number: [id number] Date of Birth/Sex: Treating RN: 05/20/1935 (84 y.o. Curtis Jenkins Primary Care Bashar Milam: [employer] Other Clinician: Referring Lyra Alaimo: Treating Anay Walter/Extender: Ronnie Doss in Treatment: 8 Visit Information History Since Last Visit Added or deleted any medications: No Patient Arrived: Wheel Chair Any new allergies or adverse reactions: No Arrival Time: 13:14 Had a fall or experienced change in No Accompanied By: self activities of daily living that may affect Transfer Assistance: Manual risk of falls: Patient Identification Verified: Yes Signs or symptoms of abuse/neglect since last visito No Secondary Verification Process Completed: Yes Hospitalized since last visit: No Patient Requires Transmission-Based Precautions: No Implantable device outside of the clinic excluding No Patient Has Alerts: Yes cellular tissue based products placed in the center Patient Alerts: Left ABI: 0.77 since last visit: Has Dressing in Place as Prescribed: Yes Pain Present Now: No Electronic Signature(s) Signed: 08/22/2019 5:16:06 PM By: Kela Millin Entered By: Kela Millin on 08/22/2019 13:16:20 -------------------------------------------------------------------------------- Encounter Discharge Information Details Patient Name: Date of Service: MA Curtis Jenkins NA RD G. 08/22/2019 1:00 PM Medical Record Number: [id number] Patient Account Number: [id number] Date of Birth/Sex: Treating RN: [birth year] (84 y.o. Curtis Jenkins Primary Care Nayshawn Mesta: [employer] Other Clinician: Referring Farooq Petrovich: Treating Paiton Fosco/Extender: Ronnie Doss in Treatment: 8 Encounter Discharge Information Items Discharge  Condition: Stable Ambulatory Status: Wheelchair Discharge Destination: Home Transportation: Private Auto Accompanied By: Charolett Bumpers Schedule Follow-up Appointment: Yes Clinical Summary of Care: Patient Declined Electronic Signature(s) Signed: 08/22/2019 4:51:12 PM By: Baruch Gouty RN, BSN Entered By: Baruch Gouty on 08/22/2019 14:10:28 -------------------------------------------------------------------------------- Lower Extremity Assessment Details Patient Name: Date of Service: MA Curtis Jenkins NA RD G. 08/22/2019 1:00 PM Medical Record Number: [id number] Patient Account Number: [id number] Date of Birth/Sex: Treating RN: 11-03-1935 (84 y.o. Curtis Jenkins Primary Care Jeane Cashatt: [employer] Other Clinician: Referring Seleste Tallman: Treating Shelbia Scinto/Extender: Ronnie Doss in Treatment: 8 Edema Assessment Assessed: Curtis Jenkins: No] Curtis Jenkins: No] Edema: [Left: Ye] [Right: s] Calf Left: Right: Point of Measurement: 39 cm From Medial Instep 34.5 cm cm Ankle Left: Right: Point of Measurement: 14 cm From Medial Instep 26 cm cm Electronic Signature(s) Signed: 08/22/2019 5:16:06 PM By: Kela Millin Entered By: Kela Millin on 08/22/2019 13:25:11 -------------------------------------------------------------------------------- [place] Details Patient Name: Date of Service: MA Curtis Jenkins NA RD G. 08/22/2019 1:00 PM Medical Record Number: [id number] Patient Account Number: [id number] Date of Birth/Sex: Treating RN: 27-Jun-1935 (84 y.o. Curtis Jenkins Primary Care Deshanta Lady: [employer] Other Clinician: Referring Dewaun Kinzler: Treating Ailany Koren/Extender: Ronnie Doss in Treatment: 8 Active Inactive Wound/Skin Impairment Nursing Diagnoses: Knowledge deficit related to ulceration/compromised skin integrity Goals: Patient/caregiver will verbalize understanding of skin care regimen Date Initiated:  06/23/2019 Target Resolution Date: 09/09/2019 Goal Status: Active Interventions: Assess patient/caregiver ability to obtain necessary supplies Assess patient/caregiver ability to perform ulcer/skin care regimen upon admission and as needed Provide education on ulcer and skin care Treatment Activities: Skin care regimen initiated : 06/23/2019 Topical wound management initiated : 06/23/2019 Notes: Electronic Signature(s) Signed: 08/22/2019 5:16:06 PM By: Kela Millin Entered By: Kela Millin on 08/22/2019 16:07:50 -------------------------------------------------------------------------------- Pain Assessment Details Patient Name: Date of Service: Curtis Jenkins NA RD G. 08/22/2019 1:00 PM Medical Record Number: [id number] Patient Account Number: [id number] Date of Birth/Sex: Treating RN: 1935/08/23 (84 y.o. Curtis Jenkins, Curtis Jenkins  Primary Care Khye Hochstetler: [employer] Other Clinician: Referring Sollie Vultaggio: Treating Berit Raczkowski/Extender: Ronnie Doss in Treatment: 8 Active Problems Location of Pain Severity and Description of Pain Patient Has Paino No Site Locations Pain Management and Medication Current Pain Management: Electronic Signature(s) Signed: 08/22/2019 5:16:06 PM By: Kela Millin Entered By: Kela Millin on 08/22/2019 13:24:31 -------------------------------------------------------------------------------- Patient/Caregiver Education Details Patient Name: Date of Service: MA Curtis Jenkins NA RD G. 8/9/2021andnbsp1:00 PM Medical Record Number: [id number] Patient Account Number: [id number] Date of Birth/Gender: Treating RN: 19-Nov-1935 (84 y.o. Curtis Jenkins Primary Care Physician: [employer] Other Clinician: Referring Physician: Treating Physician/Extender: Ronnie Doss in Treatment: 8 Education Assessment Education Provided To: Patient Education Topics Provided Wound/Skin Impairment: Handouts:  Caring for Your Ulcer Methods: Explain/Verbal Responses: State content correctly Electronic Signature(s) Signed: 08/22/2019 5:16:06 PM By: Kela Millin Entered By: Kela Millin on 08/22/2019 16:08:06 -------------------------------------------------------------------------------- Wound Assessment Details Patient Name: Date of Service: MA Curtis Jenkins NA RD G. 08/22/2019 1:00 PM Medical Record Number: [id number] Patient Account Number: [id number] Date of Birth/Sex: Treating RN: Oct 07, 1935 (84 y.o. Curtis Jenkins Primary Care Jahmani Staup: [employer] Other Clinician: Referring Towanna Avery: Treating Valerya Maxton/Extender: Ronnie Doss in Treatment: 8 Wound Status Wound Number: 1 Primary Etiology: Pressure Ulcer Wound Location: Left, Lateral Calcaneus Wound Status: Open Wounding Event: Blister Date Acquired: 05/12/2019 Weeks Of Treatment: 8 Clustered Wound: No Wound Measurements Length: (cm) 2.5 Width: (cm) 3.1 Depth: (cm) 1.6 Area: (cm) 6.087 Volume: (cm) 9.739 % Reduction in Area: 36.7% % Reduction in Volume: 36.7% Epithelialization: None Tunneling: No Undermining: No Wound Description Classification: Category/Stage IV Wound Margin: Well defined, not attached Exudate Amount: Medium Exudate Type: Serosanguineous Exudate Color: red, brown Foul Odor After Cleansing: No Slough/Fibrino Yes Wound Bed Granulation Amount: Large (67-100%) Exposed Structure Granulation Quality: Pink, Hyper-granulation Fascia Exposed: No Necrotic Amount: Small (1-33%) Fat Layer (Subcutaneous Tissue) Exposed: Yes Necrotic Quality: Adherent Slough Tendon Exposed: No Muscle Exposed: No Joint Exposed: No Bone Exposed: No Treatment Notes Wound #1 (Left, Lateral Calcaneus) 3. Primary Dressing Applied Calcium Alginate Ag 4. Secondary Dressing Dry Gauze Roll Gauze Heel Cup Electronic Signature(s) Signed: 08/22/2019 5:16:06 PM By: Kela Millin Signed:  08/22/2019 5:16:06 PM By: Kela Millin Entered By: Kela Millin on 08/22/2019 13:30:26 -------------------------------------------------------------------------------- Wound Assessment Details Patient Name: Date of Service: MA Curtis Jenkins NA RD G. 08/22/2019 1:00 PM Medical Record Number: [id number] Patient Account Number: [id number] Date of Birth/Sex: Treating RN: 09/12/1935 (84 y.o. Curtis Jenkins Primary Care Mitesh Rosendahl: [employer] Other Clinician: Referring Staceyann Knouff: Treating Evona Westra/Extender: Ronnie Doss in Treatment: 8 Wound Status Wound Number: 3 Primary Etiology: Trauma, Other Wound Location: Left, Lateral, Plantar Foot Wound Status: Open Wounding Event: Trauma Date Acquired: 08/22/2019 Weeks Of Treatment: 0 Clustered Wound: No Wound Measurements Length: (cm) 0.5 Width: (cm) 0.5 Depth: (cm) 0.1 Area: (cm) 0.196 Volume: (cm) 0.02 % Reduction in Area: % Reduction in Volume: Epithelialization: None Tunneling: No Undermining: No Wound Description Classification: Full Thickness Without Exposed Support Structures Wound Margin: Distinct, outline attached Exudate Amount: Small Exudate Type: Serosanguineous Exudate Color: red, brown Foul Odor After Cleansing: No Slough/Fibrino No Wound Bed Granulation Amount: Large (67-100%) Exposed Structure Granulation Quality: Red, Pink Fascia Exposed: No Necrotic Amount: None Present (0%) Fat Layer (Subcutaneous Tissue) Exposed: Yes Tendon Exposed: No Muscle Exposed: No Joint Exposed: No Bone Exposed: No Treatment Notes Wound #3 (Left, Lateral, Plantar Foot) 3. Primary Dressing Applied Calcium Alginate Ag 4. Secondary Dressing Dry Gauze Roll Gauze Heel Cup Electronic Signature(s)  Signed: 08/22/2019 5:16:06 PM By: Kela Millin Entered By: Kela Millin on 08/22/2019 13:35:51 -------------------------------------------------------------------------------- Vitals  Details Patient Name: Date of Service: MA Curtis Jenkins NA RD G. 08/22/2019 1:00 PM Medical Record Number: [id number] Patient Account Number: [id number] Date of Birth/Sex: Treating RN: [birth year] (84 y.o. Curtis Jenkins Primary Care Yarethzi Branan: [employer] Other Clinician: Referring Jerney Baksh: Treating Cintya Daughety/Extender: Ronnie Doss in Treatment: 8 Vital Signs Time Taken: 13:15 Temperature (F): 98.4 Height (in): 71 Pulse (bpm): 60 Weight (lbs): 250 Respiratory Rate (breaths/min): 18 Body Mass Index (BMI): 34.9 Blood Pressure (mmHg): 153/78 Reference Range: 80 - 120 mg / dl Electronic Signature(s) Signed: 08/22/2019 5:16:06 PM By: Kela Millin Entered By: Kela Millin on 08/22/2019 13:21:59

## 2019-08-22 NOTE — Progress Notes (Signed)
KASS, HERBERGER ([id number]) Visit Report for 08/22/2019 HPI Details Patient Name: Date of Service: [name] 08/22/2019 1:00 PM Medical Record Number: [id number] Patient Account Number: [id number] Date of Birth/Sex: Treating RN: 1935-08-19 (84 y.o. [name] Primary Care Provider: [employer] Other Clinician: Referring Provider: Treating Provider/Extender: Ronnie Doss in Treatment: 8 History of Present Illness HPI Description: ADMISSION 06/23/19 This is an 84 year old man who lives in [place]. He was admitted to hospital in March with altered functional level. 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 He came home with physical therapy at some point he was discovered that he had pressure ulcer on the left heel which started as a blister. He has had [facility] health [[facility] going out to change dressings. They have been using [employer]. The patient is minimally ambulatory only for transfers. They have been offloading the heel by using pillows etc. He spends most of his day in bed or in a recliner. 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 His daughter lives next door and there are other family members close by. ABI in our clinic was 0.77 on the left 06/28/2019; patient I admitted to the clinic last week who has a deep pressure ulcer on the left heel. Culture I did of this area showed Enterococcus and staph epidermidis. Empirically put him on Augmentin last week which will continue for another week. His x-ray suggested possible calcaneal erosion. He will also require an MRI. We are using silver alginate. Arterial studies are on 6/22 6/21; this is a patient with a deep pressure ulcer  on the left heel. He has his arterial studies tomorrow however currently his MRI is only booked for 7/14 at [facility]. This will not suffice. He is still on Augmentin that I started him on last week. Culture of the area superficially I did showed Enterococcus. 6/28; arterial studies got delayed until 6/30. MRI is for 7/3. He has completed Augmentin. We are using silver alginate in the wound 7/6; arterial studies actually were surprisingly good. On the left his ABI was 1 TBI is slightly reduced at 0.5 biphasic waveforms. Unfortunately his MRI documents osteomyelitis deep to the wound. Negative for abscess or septic joint. We have been using silver alginate. 7/15; we still have not heard from infectious disease. Bone culture I did showed a few Enterococcus faecalis and ampicillin sensitive. I got a few fragments of bone that did not show definite osteomyelitis nevertheless I fairly certain that this is what the patient has. With regards to the buttock area on the left buttock this is healed over. Still some erythema in the area probably indicative of pressure, wetness friction etc. We went over offloading mechanism 8/9-Patient returns he has been on IV antibiotics for his left heel osteo and due for 6-week course in total. We are using alginate to the heel he has a very tiny area on the plantar aspect of the forefoot as a new area but this is quite small and shallow. He developed it when he was transferring for an appointment Electronic Signature(s) Signed: 08/22/2019 1:52:30 PM By: Tobi Bastos MD, MBA Entered By: Tobi Bastos on 08/22/2019 13:52:30 -------------------------------------------------------------------------------- Chemical Cauterization Details Patient Name: Date of Service: [name] NA RD G. 08/22/2019 1:00 PM Medical Record  Number: [id number] Patient Account Number: [id number] Date of Birth/Sex: Treating RN: August 13, 1935 (84 y.o. [name] Primary Care Provider: [employer] Other Clinician: Referring Provider: Treating Provider/Extender: Ronnie Doss in Treatment: 8 Procedure Performed for: Wound #1 Left,Lateral Calcaneus Performed By: Physician Tobi Bastos, MD Post Procedure Diagnosis Same as Pre-procedure Notes silver nitrate Electronic Signature(s) Signed: 08/22/2019 4:24:36 PM By: Tobi Bastos MD, MBA Signed: 08/22/2019 5:19:36 PM By: Levan Hurst RN, BSN Entered By: Levan Hurst on 08/22/2019 13:53:25 -------------------------------------------------------------------------------- Physical Exam Details Patient Name: Date of Service: [name] NA RD G. 08/22/2019 1:00 PM Medical Record Number: [id number] Patient Account Number: [id number] Date of Birth/Sex: Treating RN: 08-Sep-1935 (84 y.o. [name] Primary Care Provider: [employer] Other Clinician: Referring Provider: Treating Provider/Extender: Ronnie Doss in Treatment: 8 Constitutional alert and oriented x 3. sitting or standing blood pressure is within target range for patient.. supine blood pressure is within target range for patient.. pulse regular and within target range for patient.Marland Kitchen respirations regular, non-labored and within target range for patient.Marland Kitchen temperature within target range for patient.. . . Well- nourished and well-hydrated in no acute distress. Notes Left heel wound with osteomyelitis, the most proximal aspect of the wound has hyper granulated area that was treated with silver nitrate application. There is a very small denuded area on the lateral aspect of the forefoot plantar aspect due to abrasion Electronic Signature(s) Signed: 08/22/2019 1:53:23 PM By: Tobi Bastos MD, MBA Entered By: Tobi Bastos on 08/22/2019 13:53:23 -------------------------------------------------------------------------------- Physician Orders Details Patient Name: Date of Service: [name] NA RD G. 08/22/2019  1:00 PM Medical Record Number: [id number] Patient Account Number: [id number] Date of Birth/Sex: Treating RN: 03-23-1935 (84 y.o. 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 Dressing Change Frequency Wound #1 Left,Lateral Calcaneus Change Dressing every other day. - home health change twice a week, all other days family to change, may change daily if needed for excessive drainage Wound #3 Left,Lateral,Plantar Foot Change Dressing every other day. - home health change twice a week, all other days family to change, may change daily if needed for excessive drainage Wound Cleansing Clean wound with Wound Cleanser - or wash with soap and water Primary Wound Dressing Wound #1 Left,Lateral Calcaneus Calcium Alginate with Silver Wound #3 Left,Lateral,Plantar Foot Calcium Alginate with Silver Secondary Dressing Wound #1 Left,Lateral Calcaneus Kerlix/Rolled Gauze Dry Gauze Heel Cup Wound #3 Left,Lateral,Plantar Foot Kerlix/Rolled Gauze Dry Gauze Edema Control Elevate legs to the level of the heart or above for 30 minutes daily and/or when sitting, a frequency of: - throughout the day Off-Loading Turn and reposition every 2 hours Other: - float heels with bunny boots or pillows while rest in bed or chair to aid in offloading pressure to heels. [place] skilled nursing for wound care. - [employer]) Signed: 08/22/2019 4:24:36 PM By: Tobi Bastos MD, MBA Signed: 08/22/2019 5:19:36 PM By: Levan Hurst RN, BSN Entered By: Levan Hurst on 08/22/2019 13:52:16 -------------------------------------------------------------------------------- Problem List Details Patient Name: Date of Service: [name] NA RD G. 08/22/2019 1:00 PM Medical Record Number: [id number] Patient Account Number:  [id number] Date of Birth/Sex: Treating RN: 07-Apr-1935 (84 y.o. 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.3 Idiopathic progressive neuropathy 06/23/2019 No Yes M86.672 Other chronic osteomyelitis, left ankle and foot 07/19/2019 No Yes L89.322 Pressure ulcer of left buttock, stage 2 07/19/2019 No Yes Inactive Problems ICD-10 Code Description Active Date Inactive Date L03.116 Cellulitis of left lower limb 06/23/2019 06/23/2019 I70.244 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 08/22/2019 1:00 PM Medical Record Number: [id number] Patient Account Number: [id number] Date of Birth/Sex: Treating RN: 07/25/1935 (84 y.o. [name] Primary Care Provider: [employer] Other Clinician: Referring Provider: Treating Provider/Extender: Ronnie Doss in Treatment: 8 Subjective History of Present Illness (HPI) ADMISSION 06/23/19 This is an 84 year old man who lives in [place]. He was admitted to hospital in March with altered functional level. 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 He came home with physical therapy at some point he was discovered that he had pressure ulcer on the left heel which started as a blister. He has had [facility] going out to change dressings. They have been using [employer]. The patient is minimally ambulatory only for transfers. They have been offloading the heel by using pillows etc. He spends most of his day in bed or in a recliner. 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 His daughter lives next door and there are other family members close by. ABI in our clinic was 0.77 on the left 06/28/2019; patient I admitted to the clinic last week who has a deep pressure ulcer on the left heel. Culture I did of this area showed Enterococcus and staph epidermidis. Empirically put him on Augmentin last week which will continue for another week. His x-ray suggested possible calcaneal erosion. He will also require an MRI. We are using silver alginate. Arterial studies are on 6/22 6/21; this is a patient with a deep pressure ulcer on the left heel. He has his arterial studies tomorrow however currently his MRI is only booked for 7/14 at [facility]. This will not suffice. He is still on Augmentin that I started him on last week. Culture of the area superficially I did showed Enterococcus. 6/28; arterial studies got delayed until 6/30. MRI is for 7/3. He has completed Augmentin. We are using silver alginate in the wound 7/6; arterial studies actually were surprisingly good. On the left his ABI was 1 TBI is slightly reduced at 0.5 biphasic waveforms. Unfortunately his MRI documents  osteomyelitis deep to the wound. Negative for abscess or septic joint. We have been using silver alginate. 7/15; we still have not heard from infectious disease. Bone culture I did showed a few Enterococcus faecalis and ampicillin sensitive. I got a few fragments of bone that did not show definite osteomyelitis nevertheless I fairly certain that  this is what the patient has. With regards to the buttock area on the left buttock this is healed over. Still some erythema in the area probably indicative of pressure, wetness friction etc. We went over offloading mechanism 8/9-Patient returns he has been on IV antibiotics for his left heel osteo and due for 6-week course in total. We are using alginate to the heel he has a very tiny area on the plantar aspect of the forefoot as a new area but this is quite small and shallow. He developed it when he was transferring for an appointment Objective Constitutional alert and oriented x 3. sitting or standing blood pressure is within target range for patient.. supine blood pressure is within target range for patient.. pulse regular and within target range for patient.Marland Kitchen respirations regular, non-labored and within target range for patient.Marland Kitchen temperature within target range for patient.. Well- nourished and well-hydrated in no acute distress. Vitals Time Taken: 1:15 PM, Height: 71 in, Weight: 250 lbs, BMI: 34.9, Temperature: 98.4 F, Pulse: 60 bpm, Respiratory Rate: 18 breaths/min, Blood Pressure: 153/78 mmHg. General Notes: Left heel wound with osteomyelitis, the most proximal aspect of the wound has hyper granulated area that was treated with silver nitrate application. There is a very small denuded area on the lateral aspect of the forefoot plantar aspect due to abrasion Integumentary (Hair, Skin) Wound #1 status is Open. Original cause of wound was Blister. The wound is located on the Left,Lateral Calcaneus. The wound measures 2.5cm length x 3.1cm width x 1.6cm  depth; 6.087cm^2 area and 9.739cm^3 volume. There is Fat Layer (Subcutaneous Tissue) Exposed exposed. There is no tunneling or undermining noted. There is a medium amount of serosanguineous drainage noted. The wound margin is well defined and not attached to the wound base. There is large (67- 100%) pink, hyper - granulation within the wound bed. There is a small (1-33%) amount of necrotic tissue within the wound bed including Adherent Slough. Wound #3 status is Open. Original cause of wound was Trauma. The wound is located on the [place]. The wound measures 0.5cm length x 0.5cm width x 0.1cm depth; 0.196cm^2 area and 0.02cm^3 volume. There is Fat Layer (Subcutaneous Tissue) Exposed exposed. There is no tunneling or undermining noted. There is a small amount of serosanguineous drainage noted. The wound margin is distinct with the outline attached to the wound base. There is large (67-100%) red, pink granulation within the wound bed. There is no necrotic tissue within the wound bed. Assessment Active Problems ICD-10 Pressure ulcer of left heel, unstageable Idiopathic progressive neuropathy Other chronic osteomyelitis, left ankle and foot Pressure ulcer of left buttock, stage 2 Procedures Wound #1 Pre-procedure diagnosis of Wound #1 is a Pressure Ulcer located on the Left,Lateral Calcaneus . An Chemical Cauterization procedure was performed by Tobi Bastos, MD. Post procedure Diagnosis Wound #1: Same as Pre-Procedure Notes: silver nitrate Plan Follow-up Appointments: Return Appointment in 2 weeks. Dressing Change Frequency: Wound #1 Left,Lateral Calcaneus: Change Dressing every other day. - home health change twice a week, all other days family to change, may change daily if needed for excessive drainage Wound #3 Left,Lateral,Plantar Foot: Change Dressing every other day. - home health change twice a week, all other days family to change, may change daily if needed for  excessive drainage Wound Cleansing: Clean wound with Wound Cleanser - or wash with soap and water Primary Wound Dressing: Wound #1 Left,Lateral Calcaneus: Calcium Alginate with Silver Wound #3 Left,Lateral,Plantar Foot: Calcium Alginate with Silver Secondary Dressing: Wound #1  Left,Lateral Calcaneus: Kerlix/Rolled Gauze Dry Gauze Heel Cup Wound #3 Left,Lateral,Plantar Foot: Kerlix/Rolled Gauze Dry Gauze Edema Control: Elevate legs to the level of the heart or above for 30 minutes daily and/or when sitting, a frequency of: - throughout the day Off-Loading: Turn and reposition every 2 hours Other: - float heels with bunny boots or pillows while rest in bed or chair to aid in offloading pressure to heels. Home Health: [place] skilled [facility] for wound care. - [facility] -Continue alginate to the heel wound with daily dressing changes -The more proximal wound does not need any specific management but alginate can be used here also -He is continued on IV antibiotics with vancomycin with ID following -Return to clinic in 2 weeks Electronic Signature(s) Signed: 08/22/2019 1:54:12 PM By: Tobi Bastos MD, MBA Entered By: Tobi Bastos on 08/22/2019 13:54:11 -------------------------------------------------------------------------------- SuperBill Details Patient Name: Date of Service: [name] NA RD G. 08/22/2019 Medical Record Number: [id number] Patient Account Number: [id number] Date of Birth/Sex: Treating RN: 1935-06-07 (84 y.o. 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.3 Idiopathic progressive neuropathy M86.672 Other chronic osteomyelitis, left ankle and foot L89.322 Pressure ulcer of left buttock, stage 2 Facility  Procedures CPT4 Code: [id number] Description: 78938 - CHEM CAUT GRANULATION TISS ICD-10 Diagnosis Description L89.620 Pressure ulcer of left heel, unstageable Modifier: Quantity: 1 Physician Procedures : CPT4 Code Description Modifier [id number] - WC PHYS LEVEL 3 - EST PT ICD-10 Diagnosis Description L89.620 Pressure ulcer of left heel, unstageable Quantity: 1 : [id number] - WC PHYS CHEM CAUT GRAN TISSUE ICD-10 Diagnosis Description L89.620 Pressure ulcer of left heel, unstageable Quantity: 1 Electronic Signature(s) Signed: 08/22/2019 4:24:36 PM By: Tobi Bastos MD, MBA Signed: 08/22/2019 5:16:06 PM By: Kela Millin Previous Signature: 08/22/2019 1:55:35 PM Version By: Tobi Bastos MD, MBA Entered By: Kela Millin on 08/22/2019 16:08:50

## 2019-08-23 ENCOUNTER — Encounter: Payer: Self-pay | Admitting: Urology

## 2019-08-23 ENCOUNTER — Ambulatory Visit (INDEPENDENT_AMBULATORY_CARE_PROVIDER_SITE_OTHER): Payer: Medicare Other | Admitting: *Deleted

## 2019-08-23 ENCOUNTER — Telehealth: Payer: Self-pay | Admitting: *Deleted

## 2019-08-23 DIAGNOSIS — M86172 Other acute osteomyelitis, left ankle and foot: Secondary | ICD-10-CM | POA: Diagnosis not present

## 2019-08-23 DIAGNOSIS — M25422 Effusion, left elbow: Secondary | ICD-10-CM | POA: Diagnosis not present

## 2019-08-23 DIAGNOSIS — I63531 Cerebral infarction due to unspecified occlusion or stenosis of right posterior cerebral artery: Secondary | ICD-10-CM

## 2019-08-23 DIAGNOSIS — I639 Cerebral infarction, unspecified: Secondary | ICD-10-CM | POA: Diagnosis not present

## 2019-08-23 LAB — CUP PACEART REMOTE DEVICE CHECK
Date Time Interrogation Session: 20210809230053
Implantable Pulse Generator Implant Date: 20210330

## 2019-08-23 NOTE — Telephone Encounter (Signed)
7-8 loose stools a day, incontinence. Oral probiotics have not helped. Vancomycin and ceftriaxone through 9/10. Please advise. Landis Gandy, RN

## 2019-08-23 NOTE — Patient Instructions (Signed)
Hematuria, Adult Hematuria is blood in the urine. Blood may be visible in the urine, or it may be identified with a test. This condition can be caused by infections of the bladder, urethra, kidney, or prostate. Other possible causes include:  Kidney stones.  Cancer of the urinary tract.  Too much calcium in the urine.  Conditions that are passed from parent to child (inherited conditions).  Exercise that requires a lot of energy. Infections can usually be treated with medicine, and a kidney stone usually will pass through your urine. If neither of these is the cause of your hematuria, more tests may be needed to identify the cause of your symptoms. It is very important to tell your health care provider about any blood in your urine, even if it is painless or the blood stops without treatment. Blood in the urine, when it happens and then stops and then happens again, can be a symptom of a very serious condition, including cancer. There is no pain in the initial stages of many urinary cancers. Follow these instructions at home: Medicines  Take over-the-counter and prescription medicines only as told by your health care provider.  If you were prescribed an antibiotic medicine, take it as told by your health care provider. Do not stop taking the antibiotic even if you start to feel better. Eating and drinking  Drink enough fluid to keep your urine clear or pale yellow. It is recommended that you drink 3-4 quarts (2.8-3.8 L) a day. If you have been diagnosed with an infection, it is recommended that you drink cranberry juice in addition to large amounts of water.  Avoid caffeine, tea, and carbonated beverages. These tend to irritate the bladder.  Avoid alcohol because it may irritate the prostate (men). General instructions  If you have been diagnosed with a kidney stone, follow your health care provider's instructions about straining your urine to catch the stone.  Empty your bladder  often. Avoid holding urine for long periods of time.  If you are male: ? After a bowel movement, wipe from front to back and use each piece of toilet paper only once. ? Empty your bladder before and after sex.  Pay attention to any changes in your symptoms. Tell your health care provider about any changes or any new symptoms.  It is your responsibility to get your test results. Ask your health care provider, or the department performing the test, when your results will be ready.  Keep all follow-up visits as told by your health care provider. This is important. Contact a health care provider if:  You develop back pain.  You have a fever.  You have nausea or vomiting.  Your symptoms do not improve after 3 days.  Your symptoms get worse. Get help right away if:  You develop severe vomiting and are unable take medicine without vomiting.  You develop severe pain in your back or abdomen even though you are taking medicine.  You pass a large amount of blood in your urine.  You pass blood clots in your urine.  You feel very weak or like you might faint.  You faint. Summary  Hematuria is blood in the urine. It has many possible causes.  It is very important that you tell your health care provider about any blood in your urine, even if it is painless or the blood stops without treatment.  Take over-the-counter and prescription medicines only as told by your health care provider.  Drink enough fluid to keep   your urine clear or pale yellow. This information is not intended to replace advice given to you by your health care provider. Make sure you discuss any questions you have with your health care provider. Document Revised: 05/26/2018 Document Reviewed: 02/02/2016 Elsevier Patient Education  2020 [employer]  

## 2019-08-24 DIAGNOSIS — M25422 Effusion, left elbow: Secondary | ICD-10-CM | POA: Diagnosis not present

## 2019-08-24 DIAGNOSIS — I639 Cerebral infarction, unspecified: Secondary | ICD-10-CM | POA: Diagnosis not present

## 2019-08-24 DIAGNOSIS — M86172 Other acute osteomyelitis, left ankle and foot: Secondary | ICD-10-CM | POA: Diagnosis not present

## 2019-08-24 NOTE — Progress Notes (Signed)
[facility] Summary Report / Loop Recorder 

## 2019-08-25 DIAGNOSIS — M159 Polyosteoarthritis, unspecified: Secondary | ICD-10-CM | POA: Diagnosis not present

## 2019-08-25 DIAGNOSIS — I129 Hypertensive chronic kidney disease with stage 1 through stage 4 chronic kidney disease, or unspecified chronic kidney disease: Secondary | ICD-10-CM | POA: Diagnosis not present

## 2019-08-25 DIAGNOSIS — I69954 Hemiplegia and hemiparesis following unspecified cerebrovascular disease affecting left non-dominant side: Secondary | ICD-10-CM | POA: Diagnosis not present

## 2019-08-25 DIAGNOSIS — Z720 Tobacco use: Secondary | ICD-10-CM | POA: Diagnosis not present

## 2019-08-25 DIAGNOSIS — L89620 Pressure ulcer of left heel, unstageable: Secondary | ICD-10-CM | POA: Diagnosis not present

## 2019-08-25 DIAGNOSIS — R1310 Dysphagia, unspecified: Secondary | ICD-10-CM | POA: Diagnosis not present

## 2019-08-25 DIAGNOSIS — M86172 Other acute osteomyelitis, left ankle and foot: Secondary | ICD-10-CM | POA: Diagnosis not present

## 2019-08-25 DIAGNOSIS — Z5181 Encounter for therapeutic drug level monitoring: Secondary | ICD-10-CM | POA: Diagnosis not present

## 2019-08-25 DIAGNOSIS — I639 Cerebral infarction, unspecified: Secondary | ICD-10-CM | POA: Diagnosis not present

## 2019-08-25 DIAGNOSIS — E785 Hyperlipidemia, unspecified: Secondary | ICD-10-CM | POA: Diagnosis not present

## 2019-08-25 DIAGNOSIS — M25422 Effusion, left elbow: Secondary | ICD-10-CM | POA: Diagnosis not present

## 2019-08-25 DIAGNOSIS — N182 Chronic kidney disease, stage 2 (mild): Secondary | ICD-10-CM | POA: Diagnosis not present

## 2019-08-25 DIAGNOSIS — I69391 Dysphagia following cerebral infarction: Secondary | ICD-10-CM | POA: Diagnosis not present

## 2019-08-25 DIAGNOSIS — M869 Osteomyelitis, unspecified: Secondary | ICD-10-CM | POA: Diagnosis not present

## 2019-08-25 NOTE — Telephone Encounter (Signed)
Can we test for cdiff

## 2019-08-25 NOTE — Telephone Encounter (Signed)
Relayed verbal order to [facility].

## 2019-08-26 DIAGNOSIS — L89620 Pressure ulcer of left heel, unstageable: Secondary | ICD-10-CM | POA: Diagnosis not present

## 2019-08-26 DIAGNOSIS — M159 Polyosteoarthritis, unspecified: Secondary | ICD-10-CM | POA: Diagnosis not present

## 2019-08-26 DIAGNOSIS — R1310 Dysphagia, unspecified: Secondary | ICD-10-CM | POA: Diagnosis not present

## 2019-08-26 DIAGNOSIS — N182 Chronic kidney disease, stage 2 (mild): Secondary | ICD-10-CM | POA: Diagnosis not present

## 2019-08-26 DIAGNOSIS — M86172 Other acute osteomyelitis, left ankle and foot: Secondary | ICD-10-CM | POA: Diagnosis not present

## 2019-08-26 DIAGNOSIS — Z720 Tobacco use: Secondary | ICD-10-CM | POA: Diagnosis not present

## 2019-08-26 DIAGNOSIS — I639 Cerebral infarction, unspecified: Secondary | ICD-10-CM | POA: Diagnosis not present

## 2019-08-26 DIAGNOSIS — E785 Hyperlipidemia, unspecified: Secondary | ICD-10-CM | POA: Diagnosis not present

## 2019-08-26 DIAGNOSIS — M25422 Effusion, left elbow: Secondary | ICD-10-CM | POA: Diagnosis not present

## 2019-08-26 DIAGNOSIS — I129 Hypertensive chronic kidney disease with stage 1 through stage 4 chronic kidney disease, or unspecified chronic kidney disease: Secondary | ICD-10-CM | POA: Diagnosis not present

## 2019-08-26 DIAGNOSIS — I69954 Hemiplegia and hemiparesis following unspecified cerebrovascular disease affecting left non-dominant side: Secondary | ICD-10-CM | POA: Diagnosis not present

## 2019-08-26 DIAGNOSIS — I69391 Dysphagia following cerebral infarction: Secondary | ICD-10-CM | POA: Diagnosis not present

## 2019-08-27 DIAGNOSIS — M86172 Other acute osteomyelitis, left ankle and foot: Secondary | ICD-10-CM | POA: Diagnosis not present

## 2019-08-27 DIAGNOSIS — M25422 Effusion, left elbow: Secondary | ICD-10-CM | POA: Diagnosis not present

## 2019-08-27 DIAGNOSIS — I639 Cerebral infarction, unspecified: Secondary | ICD-10-CM | POA: Diagnosis not present

## 2019-08-28 DIAGNOSIS — I639 Cerebral infarction, unspecified: Secondary | ICD-10-CM | POA: Diagnosis not present

## 2019-08-28 DIAGNOSIS — M25422 Effusion, left elbow: Secondary | ICD-10-CM | POA: Diagnosis not present

## 2019-08-28 DIAGNOSIS — M86172 Other acute osteomyelitis, left ankle and foot: Secondary | ICD-10-CM | POA: Diagnosis not present

## 2019-08-29 DIAGNOSIS — M869 Osteomyelitis, unspecified: Secondary | ICD-10-CM | POA: Diagnosis not present

## 2019-08-29 DIAGNOSIS — M25422 Effusion, left elbow: Secondary | ICD-10-CM | POA: Diagnosis not present

## 2019-08-29 DIAGNOSIS — M159 Polyosteoarthritis, unspecified: Secondary | ICD-10-CM | POA: Diagnosis not present

## 2019-08-29 DIAGNOSIS — I69391 Dysphagia following cerebral infarction: Secondary | ICD-10-CM | POA: Diagnosis not present

## 2019-08-29 DIAGNOSIS — I639 Cerebral infarction, unspecified: Secondary | ICD-10-CM | POA: Diagnosis not present

## 2019-08-29 DIAGNOSIS — M86172 Other acute osteomyelitis, left ankle and foot: Secondary | ICD-10-CM | POA: Diagnosis not present

## 2019-08-29 DIAGNOSIS — R1310 Dysphagia, unspecified: Secondary | ICD-10-CM | POA: Diagnosis not present

## 2019-08-29 DIAGNOSIS — Z720 Tobacco use: Secondary | ICD-10-CM | POA: Diagnosis not present

## 2019-08-29 DIAGNOSIS — E785 Hyperlipidemia, unspecified: Secondary | ICD-10-CM | POA: Diagnosis not present

## 2019-08-29 DIAGNOSIS — I129 Hypertensive chronic kidney disease with stage 1 through stage 4 chronic kidney disease, or unspecified chronic kidney disease: Secondary | ICD-10-CM | POA: Diagnosis not present

## 2019-08-29 DIAGNOSIS — L89620 Pressure ulcer of left heel, unstageable: Secondary | ICD-10-CM | POA: Diagnosis not present

## 2019-08-29 DIAGNOSIS — N182 Chronic kidney disease, stage 2 (mild): Secondary | ICD-10-CM | POA: Diagnosis not present

## 2019-08-29 DIAGNOSIS — I69954 Hemiplegia and hemiparesis following unspecified cerebrovascular disease affecting left non-dominant side: Secondary | ICD-10-CM | POA: Diagnosis not present

## 2019-08-29 DIAGNOSIS — Z5181 Encounter for therapeutic drug level monitoring: Secondary | ICD-10-CM | POA: Diagnosis not present

## 2019-08-30 DIAGNOSIS — M86172 Other acute osteomyelitis, left ankle and foot: Secondary | ICD-10-CM | POA: Diagnosis not present

## 2019-08-30 DIAGNOSIS — I639 Cerebral infarction, unspecified: Secondary | ICD-10-CM | POA: Diagnosis not present

## 2019-08-30 DIAGNOSIS — M25422 Effusion, left elbow: Secondary | ICD-10-CM | POA: Diagnosis not present

## 2019-08-31 DIAGNOSIS — M25422 Effusion, left elbow: Secondary | ICD-10-CM | POA: Diagnosis not present

## 2019-08-31 DIAGNOSIS — M86172 Other acute osteomyelitis, left ankle and foot: Secondary | ICD-10-CM | POA: Diagnosis not present

## 2019-08-31 DIAGNOSIS — I639 Cerebral infarction, unspecified: Secondary | ICD-10-CM | POA: Diagnosis not present

## 2019-09-01 ENCOUNTER — Telehealth (INDEPENDENT_AMBULATORY_CARE_PROVIDER_SITE_OTHER): Payer: Medicare Other | Admitting: Adult Health

## 2019-09-01 ENCOUNTER — Encounter: Payer: Self-pay | Admitting: Adult Health

## 2019-09-01 DIAGNOSIS — M25429 Effusion, unspecified elbow: Secondary | ICD-10-CM | POA: Diagnosis not present

## 2019-09-01 DIAGNOSIS — I69398 Other sequelae of cerebral infarction: Secondary | ICD-10-CM

## 2019-09-01 DIAGNOSIS — M159 Polyosteoarthritis, unspecified: Secondary | ICD-10-CM | POA: Diagnosis not present

## 2019-09-01 DIAGNOSIS — R1310 Dysphagia, unspecified: Secondary | ICD-10-CM | POA: Diagnosis not present

## 2019-09-01 DIAGNOSIS — L89620 Pressure ulcer of left heel, unstageable: Secondary | ICD-10-CM | POA: Diagnosis not present

## 2019-09-01 DIAGNOSIS — M25422 Effusion, left elbow: Secondary | ICD-10-CM | POA: Diagnosis not present

## 2019-09-01 DIAGNOSIS — I480 Paroxysmal atrial fibrillation: Secondary | ICD-10-CM

## 2019-09-01 DIAGNOSIS — I63431 Cerebral infarction due to embolism of right posterior cerebral artery: Secondary | ICD-10-CM | POA: Diagnosis not present

## 2019-09-01 DIAGNOSIS — E785 Hyperlipidemia, unspecified: Secondary | ICD-10-CM | POA: Diagnosis not present

## 2019-09-01 DIAGNOSIS — I10 Essential (primary) hypertension: Secondary | ICD-10-CM | POA: Diagnosis not present

## 2019-09-01 DIAGNOSIS — Z5181 Encounter for therapeutic drug level monitoring: Secondary | ICD-10-CM | POA: Diagnosis not present

## 2019-09-01 DIAGNOSIS — I69391 Dysphagia following cerebral infarction: Secondary | ICD-10-CM | POA: Diagnosis not present

## 2019-09-01 DIAGNOSIS — M869 Osteomyelitis, unspecified: Secondary | ICD-10-CM | POA: Diagnosis not present

## 2019-09-01 DIAGNOSIS — Z720 Tobacco use: Secondary | ICD-10-CM | POA: Diagnosis not present

## 2019-09-01 DIAGNOSIS — H547 Unspecified visual loss: Secondary | ICD-10-CM

## 2019-09-01 DIAGNOSIS — I639 Cerebral infarction, unspecified: Secondary | ICD-10-CM | POA: Diagnosis not present

## 2019-09-01 DIAGNOSIS — N182 Chronic kidney disease, stage 2 (mild): Secondary | ICD-10-CM | POA: Diagnosis not present

## 2019-09-01 DIAGNOSIS — G8194 Hemiplegia, unspecified affecting left nondominant side: Secondary | ICD-10-CM

## 2019-09-01 DIAGNOSIS — M86179 Other acute osteomyelitis, unspecified ankle and foot: Secondary | ICD-10-CM | POA: Diagnosis not present

## 2019-09-01 DIAGNOSIS — I129 Hypertensive chronic kidney disease with stage 1 through stage 4 chronic kidney disease, or unspecified chronic kidney disease: Secondary | ICD-10-CM | POA: Diagnosis not present

## 2019-09-01 DIAGNOSIS — M86172 Other acute osteomyelitis, left ankle and foot: Secondary | ICD-10-CM | POA: Diagnosis not present

## 2019-09-01 DIAGNOSIS — I69954 Hemiplegia and hemiparesis following unspecified cerebrovascular disease affecting left non-dominant side: Secondary | ICD-10-CM | POA: Diagnosis not present

## 2019-09-01 NOTE — Progress Notes (Signed)
[facility] [address] [place]. [place] [address] [phone]       STROKE FOLLOW-UP NOTE  [name] Date of Birth:  1935/06/14 Medical Record Number:  [id number]   Virtual Visit via Video Note  I connected with Curtis Jenkins on 09/01/19 at  2:45 PM EDT by a video enabled telemedicine application located in office and verified that I am speaking with the correct person using two identifiers who was located at their own home accompanied by his daughter.   I discussed the limitations of evaluation and management by telemedicine and the availability of in person appointments. The patient expressed understanding and agreed to proceed.     HPI:   Today, 09/01/2019, Curtis Jenkins is being seen via virtual visit for stroke follow-up accompanied by his daughter.  Residual deficits left-sided peripheral vision impairment and left sided weakness Daughter reports gradual improvement but currently limited due to deep pressure ulcer on left heel managed by wound clinic and receiving IV antibiotics.  Therapy placed on hold due to this reason Limited ambulation as he is currently nonweightbearing Denies new or worsening stroke/TIA symptoms  Restarted back on Eliquis 2.5mg  BID without bleeding or bruising Continues on Crestor 20mg  daily without myalgias Blood pressure monitored by [facility] nurse 2x weekly which has been stable  Underwent bladder tumor removal on 08/11/2019 and continues to follow closely with urology Denies additional hematuria at this time  No further concerns    History provided for reference purposes only Initial visit 05/17/2019 Curtis Jenkins: Curtis Jenkins is a 84 year old Caucasian male seen today for initial office follow-up visit following hospital admission for stroke in March 2021.  History is obtained from the patient as well as his daughter is accompanying her and review of electronic medical records and have personally reviewed imaging films in  PACS.  He has past medical history of hypertension, hyperlipidemia who [facility] sudden onset left-sided weakness, speech and swallowing difficulties and was transferred to [facility] on 04/09/2019.  He stated he had problems going on for about a week and at baseline uses a walker to ambulate due to arthritis.  He had previously had a CT scan done a week prior for unsteady gait and weakness but that was unremarkable.  He continues to have difficulties on his left side and hence went back to the hospital and repeat CT scan showed a large right PCA infarct.  His NIH HSS was 10 and had dense left, MS hemianopsia and mild left hemiparesis with subjective diminished left hemibody sensation.  MRI scan confirmed a large subacute right PCA infarct with moderate chronic small vessel disease changes.  CT angiogram of the brain showed chronic right PCA occlusion with widely patent carotid and vertebral arteries.  2D echo showed slightly diminished ejection fraction of 40 to 45% with global hypokinesis.  No definite clot was noted.  LDL cholesterol 77 mg percent.  Hemoglobin A1c was 6.3.  Urine drug screen was negative.  He was started on aspirin and Plavix and had a loop recorder placed.  Patient had paroxysmal A. fib diagnosed on 05/05/2019.  Patient was started on Eliquis after discussion with Curtis Jenkins.  Patient took it only for few days and developed hematuria and was asked to stop it.  He does have history of chronic prostate problems and sees Dr. Eulas Jenkins from [facility] urology and has an upcoming appointment to see him on Friday.  He is also since developed left heel  pressure ulcer and not been walking a lot because of that.  He has been getting home physical and occupational therapy and has been working with them with transfers and standing only.  His left-sided peripheral vision loss persists but he feels it may be improving.  Left-sided numbness is a lot better.  Patient on inquiry  also admits of mild short-term memory difficulties there is noticed and the daughter state that she has to repeat herself and give him the same information more than once.      PMH:  Past Medical History:  Diagnosis Date  . Arthritis   . Hyperlipidemia   . Hypertension   . Sleep apnea   . Stroke [facility])    left sided weakness    Social History:  Social History   Socioeconomic History  . Marital status: Married    Spouse name: Not on file  . Number of children: Not on file  . Years of education: Not on file  . Highest education level: Not on file  Occupational History  . Not on file  Tobacco Use  . Smoking status: Never Smoker  . Smokeless tobacco: Current User    Types: Chew  Substance and Sexual Activity  . Alcohol use: Not Currently  . Drug use: Never  . Sexual activity: Not on file  Other Topics Concern  . Not on file  Social History Narrative  . Not on file   Social Determinants of Health   Financial Resource Strain:   . Difficulty of Paying Living Expenses: Not on file  Food Insecurity:   . Worried About Charity fundraiser in the Last Year: Not on file  . Ran Out of Food in the Last Year: Not on file  Transportation Needs:   . Lack of Transportation (Medical): Not on file  . Lack of Transportation (Non-Medical): Not on file  Physical Activity:   . Days of Exercise per Week: Not on file  . Minutes of Exercise per Session: Not on file  Stress:   . Feeling of Stress : Not on file  Social Connections:   . Frequency of Communication with Friends and Family: Not on file  . Frequency of Social Gatherings with Friends and Family: Not on file  . Attends Religious Services: Not on file  . Active Member of Clubs or Organizations: Not on file  . Attends Archivist Meetings: Not on file  . Marital Status: Not on file  Intimate Partner Violence:   . Fear of Current or Ex-Partner: Not on file  . Emotionally Abused: Not on file  . Physically Abused: Not  on file  . Sexually Abused: Not on file    Medications:   Current Outpatient Medications on File Prior to Visit  Medication Sig Dispense Refill  . amLODipine (NORVASC) 5 MG tablet Take 5 mg by mouth daily.    Marland Kitchen apixaban (ELIQUIS) 5 MG TABS tablet Take 2.5 mg by mouth 2 (two) times daily.    . calcium-vitamin D (OSCAL WITH D) 500-200 MG-UNIT tablet Take 1 tablet by mouth 2 (two) times daily. 60 tablet 2  . cefTRIAXone (ROCEPHIN) IVPB Inject 2 g into the vein. 2g in Sodium Chloride   100  ml    . clotrimazole-betamethasone (LOTRISONE) cream Apply 1 application topically 2 (two) times daily as needed (irritation/rash.).     Marland Kitchen feeding supplement, ENSURE ENLIVE, (ENSURE ENLIVE) LIQD Take 237 mLs by mouth 3 (three) times daily after meals. (Patient taking differently: Take 237  mLs by mouth daily. ) 237 mL 12  . Magnesium 200 MG TABS Take 200 mg by mouth in the morning and at bedtime.     . Methylcellulose, Laxative, (FIBER THERAPY) 500 MG TABS Take 500 mg by mouth in the morning and at bedtime.    . rosuvastatin (CRESTOR) 20 MG tablet Take 20 mg by mouth every evening.     . traMADol (ULTRAM) 50 MG tablet Take 50 mg by mouth 3 (three) times daily as needed (pain.).     Marland Kitchen vancomycin IVPB Inject into the vein.    Marland Kitchen zolpidem (AMBIEN) 10 MG tablet Take 10 mg by mouth at bedtime.      No current facility-administered medications on file prior to visit.    Allergies:  No Known Allergies  Physical Exam Limited due to visit type  General: well developed, well nourished, pleasant elderly Caucasian male, seated, in no evident distress Head: head normocephalic and atraumatic.    Neurologic Exam Mental Status: Awake and fully alert. Oriented to place and time. Recent and remote memory intact. Attention span, concentration and fund of knowledge appropriate. Mood and affect appropriate.  Cranial Nerves: Extraocular movements full without nystagmus. Hearing intact to voice. Facial sensation intact.  Mild  left lower facial asymmetry.  Tongue, and palate moves normally and symmetrically.  Shoulder shrug symmetric.  Subjective left periphery impairment Motor: Mild pronator drift LUE Gait and Station: Deferred as patient primarily nonweightbearing     ASSESSMENT/PLAN: 84 year old Caucasian male with embolic right posterior cerebral artery infarct and March 2021 secondary to paroxysmal atrial fibrillation which was diagnosed later on loop recorder.  He has significant residual left homonymous hemianopsia and left hemiparesis and mild cognitive impairment.  Vascular risk factors of hyperlipidemia, hypertension, paroxysmal A. fib and age    -Residual left hemiparesis and left peripheral deficit -Therapy is currently on hold due to left heel ulcer -Encouraged to restart therapy once cleared by wound -Continue Eliquis 2.5 mg twice daily and Crestor for secondary stroke prevention -Ensure close PCP and cardiology follow-up for aggressive stroke risk factor management -maintain strict control of hypertension with blood pressure goal below 130/90 and lipids with LDL cholesterol goal below 70 mg/dL. -advised the patient to eat a healthy diet with plenty of whole grains, cereals, fruits and vegetables, exercise regularly and maintain ideal body weight   Follow-up in 6 months or call earlier if needed  I spent 20 minutes of non-face-to-face time with patient and daughter via virtual visit.  This included previsit chart review, lab review, study review, order entry, electronic health record documentation, patient education regarding prior stroke, residual deficits, current heel ulcer, importance of managing stroke risk factors and answered all questions to patient satisfaction  Frann Rider, AGNP-BC  [facility] [address] [place], [place] [address]  Phone [phone] Fax [phone] Note: This document was prepared with digital dictation and possible smart phrase  technology. Any transcriptional errors that result from this process are unintentional.

## 2019-09-02 DIAGNOSIS — M86172 Other acute osteomyelitis, left ankle and foot: Secondary | ICD-10-CM | POA: Diagnosis not present

## 2019-09-02 DIAGNOSIS — I639 Cerebral infarction, unspecified: Secondary | ICD-10-CM | POA: Diagnosis not present

## 2019-09-02 DIAGNOSIS — M25422 Effusion, left elbow: Secondary | ICD-10-CM | POA: Diagnosis not present

## 2019-09-02 NOTE — Progress Notes (Signed)
I agree with the above plan 

## 2019-09-03 DIAGNOSIS — I639 Cerebral infarction, unspecified: Secondary | ICD-10-CM | POA: Diagnosis not present

## 2019-09-03 DIAGNOSIS — M86172 Other acute osteomyelitis, left ankle and foot: Secondary | ICD-10-CM | POA: Diagnosis not present

## 2019-09-03 DIAGNOSIS — M25422 Effusion, left elbow: Secondary | ICD-10-CM | POA: Diagnosis not present

## 2019-09-04 DIAGNOSIS — M25422 Effusion, left elbow: Secondary | ICD-10-CM | POA: Diagnosis not present

## 2019-09-04 DIAGNOSIS — I639 Cerebral infarction, unspecified: Secondary | ICD-10-CM | POA: Diagnosis not present

## 2019-09-04 DIAGNOSIS — M86172 Other acute osteomyelitis, left ankle and foot: Secondary | ICD-10-CM | POA: Diagnosis not present

## 2019-09-05 ENCOUNTER — Encounter (HOSPITAL_BASED_OUTPATIENT_CLINIC_OR_DEPARTMENT_OTHER): Payer: Medicare Other | Admitting: Internal Medicine

## 2019-09-05 DIAGNOSIS — M86172 Other acute osteomyelitis, left ankle and foot: Secondary | ICD-10-CM | POA: Diagnosis not present

## 2019-09-05 DIAGNOSIS — I639 Cerebral infarction, unspecified: Secondary | ICD-10-CM | POA: Diagnosis not present

## 2019-09-05 DIAGNOSIS — I69391 Dysphagia following cerebral infarction: Secondary | ICD-10-CM | POA: Diagnosis not present

## 2019-09-05 DIAGNOSIS — Z8673 Personal history of transient ischemic attack (TIA), and cerebral infarction without residual deficits: Secondary | ICD-10-CM | POA: Diagnosis not present

## 2019-09-05 DIAGNOSIS — I69954 Hemiplegia and hemiparesis following unspecified cerebrovascular disease affecting left non-dominant side: Secondary | ICD-10-CM | POA: Diagnosis not present

## 2019-09-05 DIAGNOSIS — R1310 Dysphagia, unspecified: Secondary | ICD-10-CM | POA: Diagnosis not present

## 2019-09-05 DIAGNOSIS — Z7901 Long term (current) use of anticoagulants: Secondary | ICD-10-CM | POA: Diagnosis not present

## 2019-09-05 DIAGNOSIS — L89620 Pressure ulcer of left heel, unstageable: Secondary | ICD-10-CM | POA: Diagnosis not present

## 2019-09-05 DIAGNOSIS — Z923 Personal history of irradiation: Secondary | ICD-10-CM | POA: Diagnosis not present

## 2019-09-05 DIAGNOSIS — I129 Hypertensive chronic kidney disease with stage 1 through stage 4 chronic kidney disease, or unspecified chronic kidney disease: Secondary | ICD-10-CM | POA: Diagnosis not present

## 2019-09-05 DIAGNOSIS — M869 Osteomyelitis, unspecified: Secondary | ICD-10-CM | POA: Diagnosis not present

## 2019-09-05 DIAGNOSIS — N182 Chronic kidney disease, stage 2 (mild): Secondary | ICD-10-CM | POA: Diagnosis not present

## 2019-09-05 DIAGNOSIS — Z5181 Encounter for therapeutic drug level monitoring: Secondary | ICD-10-CM | POA: Diagnosis not present

## 2019-09-05 DIAGNOSIS — M86672 Other chronic osteomyelitis, left ankle and foot: Secondary | ICD-10-CM | POA: Diagnosis not present

## 2019-09-05 DIAGNOSIS — M159 Polyosteoarthritis, unspecified: Secondary | ICD-10-CM | POA: Diagnosis not present

## 2019-09-05 DIAGNOSIS — I480 Paroxysmal atrial fibrillation: Secondary | ICD-10-CM | POA: Diagnosis not present

## 2019-09-05 DIAGNOSIS — E785 Hyperlipidemia, unspecified: Secondary | ICD-10-CM | POA: Diagnosis not present

## 2019-09-05 DIAGNOSIS — M25422 Effusion, left elbow: Secondary | ICD-10-CM | POA: Diagnosis not present

## 2019-09-05 DIAGNOSIS — L89322 Pressure ulcer of left buttock, stage 2: Secondary | ICD-10-CM | POA: Diagnosis not present

## 2019-09-05 DIAGNOSIS — Z720 Tobacco use: Secondary | ICD-10-CM | POA: Diagnosis not present

## 2019-09-05 DIAGNOSIS — G603 Idiopathic progressive neuropathy: Secondary | ICD-10-CM | POA: Diagnosis not present

## 2019-09-06 DIAGNOSIS — M86172 Other acute osteomyelitis, left ankle and foot: Secondary | ICD-10-CM | POA: Diagnosis not present

## 2019-09-06 DIAGNOSIS — M25422 Effusion, left elbow: Secondary | ICD-10-CM | POA: Diagnosis not present

## 2019-09-06 DIAGNOSIS — I639 Cerebral infarction, unspecified: Secondary | ICD-10-CM | POA: Diagnosis not present

## 2019-09-06 NOTE — Progress Notes (Signed)
MIDAS, DAUGHETY ([id number]) Visit Report for 09/05/2019 HPI Details Patient Name: Date of Service: [name] 09/05/2019 1:00 PM Medical Record Number: [id number] Patient Account Number: [id number] Date of Birth/Sex: Treating RN: 01-02-1936 (84 y.o. [name] Primary Care Provider: [employer] Other Clinician: Referring Provider: Treating Provider/Extender: Garfield Cornea in Treatment: 10 History of Present Illness HPI Description: ADMISSION 06/23/19 This is an 84 year old man who lives in [place]. He was admitted to hospital in March with altered functional level. 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 He came home with physical therapy at some point he was discovered that he had pressure ulcer on the left heel which started as a blister. He has had home health [[facility]} going out to change dressings. They have been using [employer]. The patient is minimally ambulatory only for transfers. They have been offloading the heel by using pillows etc. He spends most of his day in bed or in a recliner. 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 His daughter lives next door and there are other family members close by. ABI in our clinic was 0.77 on the left 06/28/2019; patient I admitted to the clinic last week who has a deep pressure ulcer on the left heel. Culture I did of this area showed Enterococcus and staph epidermidis. Empirically put him on Augmentin last week which will continue for another week. His x-ray suggested possible calcaneal erosion. He will also require an MRI. We are using silver alginate. Arterial studies are on 6/22 6/21; this is a patient with a deep pressure  ulcer on the left heel. He has his arterial studies tomorrow however currently his MRI is only booked for 7/14 at [facility]. This will not suffice. He is still on Augmentin that I started him on last week. Culture of the area superficially I did showed Enterococcus. 6/28; arterial studies got delayed until 6/30. MRI is for 7/3. He has completed Augmentin. We are using silver alginate in the wound 7/6; arterial studies actually were surprisingly good. On the left his ABI was 1 TBI is slightly reduced at 0.5 biphasic waveforms. Unfortunately his MRI documents osteomyelitis deep to the wound. Negative for abscess or septic joint. We have been using silver alginate. 7/15; we still have not heard from infectious disease. Bone culture I did showed a few Enterococcus faecalis and ampicillin sensitive. I got a few fragments of bone that did not show definite osteomyelitis nevertheless I fairly certain that this is what the patient has. With regards to the buttock area on the left buttock this is healed over. Still some erythema in the area probably indicative of pressure, wetness friction etc. We went over offloading mechanism 8/9-Patient returns he has been on IV antibiotics for his left heel osteo and due for 6-week course in total. We are using alginate to the heel he has a very tiny area on the plantar aspect of the forefoot as a new area but this is quite small and shallow. He developed it when he was transferring for an appointment 8/23; 2-week follow-up. Area on the left heel. Hyper granulated today. Still on IV vancomycin and Rocephin week 4/6 as directed by infectious disease for underlying osteomyelitis in the left calcaneus Electronic Signature(s) Signed: 09/05/2019 4:33:08 PM By:  Linton Ham MD Entered By: Linton Ham on 09/05/2019 13:54:04 -------------------------------------------------------------------------------- Chemical Cauterization Details Patient Name: Date of Service: MA  [name] NA RD G. 09/05/2019 1:00 PM Medical Record Number: [id number] Patient Account Number: [id number] Date of Birth/Sex: Treating RN: Sep 28, 1935 (84 y.o. [name] Primary Care Provider: [employer] Other Clinician: Referring Provider: Treating Provider/Extender: Garfield Cornea in Treatment: 10 Procedure Performed for: Wound #1 Left,Lateral Calcaneus Performed By: Physician Ricard Dillon., MD Post Procedure Diagnosis Same as Pre-procedure Notes silver nitrate Electronic Signature(s) Signed: 09/05/2019 4:33:08 PM By: Linton Ham MD Entered By: Linton Ham on 09/05/2019 13:52:28 -------------------------------------------------------------------------------- Physical Exam Details Patient Name: Date of Service: [name] NA RD G. 09/05/2019 1:00 PM Medical Record Number: [id number] Patient Account Number: [id number] Date of Birth/Sex: Treating RN: March 16, 1935 (84 y.o. [name] Primary Care Provider: [employer] Other Clinician: Referring Provider: Treating Provider/Extender: Garfield Cornea in Treatment: 10 Notes Wound exam; left heel wound. The tissue here is friable and hyper granulated. I treated this with silver nitrate to help cauterize and reduce the amount of hyper granulation. There is some undermining. The small area on the lateral aspect of the forefoot is closed Electronic Signature(s) Signed: 09/05/2019 4:33:08 PM By: Linton Ham MD Entered By: Linton Ham on 09/05/2019 13:54:59 -------------------------------------------------------------------------------- Physician Orders Details Patient Name: Date of Service: [name] NA RD G. 09/05/2019 1:00 PM Medical Record Number: [id number] Patient Account Number: [id number] Date of Birth/Sex: Treating RN: 1935-04-01 (84 y.o. 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 Dressing Change Frequency Wound #1 Left,Lateral Calcaneus Change Dressing every other day. - home health change twice a week, all other days family to change, may change daily if needed for excessive drainage Wound Cleansing Clean wound with Wound Cleanser - or wash with soap and water Primary Wound Dressing Wound #1 Left,Lateral Calcaneus Calcium Alginate with Silver Secondary Dressing Wound #1 Left,Lateral Calcaneus Kerlix/Rolled Gauze Dry Gauze Heel Cup Edema Control Elevate legs to the level of the heart or above for 30 minutes daily and/or when sitting, a frequency of: - throughout the day Off-Loading Turn and reposition every 2 hours Other: - float heels with bunny boots or pillows while rest in bed or chair to aid in offloading pressure to heels. [place] skilled nursing for wound care. - [facility] Electronic Signature(s) Signed: 09/05/2019 4:21:43 PM By: Levan Hurst RN, BSN Signed: 09/05/2019 4:33:08 PM By: Linton Ham MD Entered By: Levan Hurst on 09/05/2019 13:44:07 -------------------------------------------------------------------------------- Problem List Details Patient Name: Date of Service: [name] NA RD G. 09/05/2019 1:00 PM Medical Record Number: [id number] Patient Account Number: [id number] Date of Birth/Sex: Treating RN: November 13, 1935 (84 y.o. 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.3 Idiopathic progressive neuropathy 06/23/2019 No Yes M86.672 Other chronic osteomyelitis, left ankle and foot 07/19/2019 No Yes L89.322 Pressure ulcer of left buttock, stage 2 07/19/2019 No Yes Inactive Problems ICD-10  Code Description Active Date Inactive Date L03.116 Cellulitis of left lower limb 06/23/2019 06/23/2019 I70.244 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 09/05/2019 1:00 PM Medical Record Number: [id number] Patient Account Number: [id number] Date of Birth/Sex: Treating RN: Oct 21, 1935 (84 y.o. [name] Primary Care Provider: [employer] Other Clinician: Referring Provider: Treating Provider/Extender: Garfield Cornea in Treatment: 10 Subjective History of Present Illness (HPI) ADMISSION 06/23/19 This is an 84 year old man who lives in [place]. He was admitted to hospital in March with altered functional level. 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 He came home with physical therapy at some point he was discovered that he had pressure ulcer on the left heel which started as a blister. He has had home health [[facility]} going out to change dressings. They have been using [employer]. The patient is minimally ambulatory only for transfers. They have been offloading the heel by using pillows etc. He spends most of his day in bed or in a recliner. 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 His daughter lives next door and there are other family members close by. ABI in our clinic was 0.77 on the left 06/28/2019; patient I admitted to the clinic last week who has a deep pressure ulcer on the left heel. Culture I did of this area showed Enterococcus and staph epidermidis. Empirically put him on Augmentin last week which will continue for another week. His x-ray suggested possible calcaneal erosion. He will also require an MRI. We are using silver alginate. Arterial studies are on 6/22 6/21; this is a patient with a deep pressure ulcer on the left heel. He has his arterial studies tomorrow however currently his MRI is only booked for 7/14 at [facility]. This will not suffice. He is still on Augmentin that I started him on last week. Culture of the area superficially I did showed Enterococcus. 6/28; arterial studies got delayed until 6/30. MRI is for 7/3. He has completed Augmentin. We are using silver alginate in the wound 7/6; arterial studies actually were surprisingly good. On the left his ABI was 1 TBI is slightly reduced at 0.5 biphasic waveforms. Unfortunately his MRI documents osteomyelitis deep to the wound. Negative for abscess or septic joint. We have been using silver alginate. 7/15; we still have not heard from infectious disease. Bone culture I did showed a few Enterococcus faecalis and ampicillin sensitive. I got a few fragments of bone that did not show definite osteomyelitis nevertheless I fairly certain that this is what the patient has. With regards to the buttock area on the left buttock this is healed over. Still some erythema in the area probably indicative of pressure, wetness friction etc. We went over offloading mechanism 8/9-Patient returns he has been on IV  antibiotics for his left heel osteo and due for 6-week course in total. We are using alginate to the heel he has a very tiny area on the plantar aspect of the forefoot as a new area but this is quite small and shallow. He developed it when  he was transferring for an appointment 8/23; 2-week follow-up. Area on the left heel. Hyper granulated today. Still on IV vancomycin and Rocephin week 4/6 as directed by infectious disease for underlying osteomyelitis in the left calcaneus Objective Constitutional Vitals Time Taken: 1:17 PM, Height: 71 in, Weight: 250 lbs, BMI: 34.9, Temperature: 98.5 F, Pulse: 67 bpm, Respiratory Rate: 18 breaths/min, Blood Pressure: 125/74 mmHg. Integumentary (Hair, Skin) Wound #1 status is Open. Original cause of wound was Blister. The wound is located on the Left,Lateral Calcaneus. The wound measures 2.6cm length x 1.7cm width x 0.9cm depth; 3.471cm^2 area and 3.124cm^3 volume. There is Fat Layer (Subcutaneous Tissue) exposed. There is no tunneling or undermining noted. There is a medium amount of serosanguineous drainage noted. The wound margin is well defined and not attached to the wound base. There is large (67-100%) pink, hyper - granulation within the wound bed. There is a small (1-33%) amount of necrotic tissue within the wound bed including Adherent Slough. Wound #3 status is Open. Original cause of wound was Trauma. The wound is located on the [place]. The wound measures 0cm length x 0cm width x 0cm depth; 0cm^2 area and 0cm^3 volume. There is no tunneling or undermining noted. There is a none present amount of drainage noted. The wound margin is distinct with the outline attached to the wound base. There is no granulation within the wound bed. There is no necrotic tissue within the wound bed. Assessment Active Problems ICD-10 Pressure ulcer of left heel, unstageable Idiopathic progressive neuropathy Other chronic osteomyelitis, left ankle and  foot Pressure ulcer of left buttock, stage 2 Procedures Wound #1 Pre-procedure diagnosis of Wound #1 is a Pressure Ulcer located on the Left,Lateral Calcaneus . An Chemical Cauterization procedure was performed by Ricard Dillon., MD. Post procedure Diagnosis Wound #1: Same as Pre-Procedure Notes: silver nitrate Plan Follow-up Appointments: Return appointment in 3 weeks. Dressing Change Frequency: Wound #1 Left,Lateral Calcaneus: Change Dressing every other day. - home health change twice a week, all other days family to change, may change daily if needed for excessive drainage Wound Cleansing: Clean wound with Wound Cleanser - or wash with soap and water Primary Wound Dressing: Wound #1 Left,Lateral Calcaneus: Calcium Alginate with Silver Secondary Dressing: Wound #1 Left,Lateral Calcaneus: Kerlix/Rolled Gauze Dry Gauze Heel Cup Edema Control: Elevate legs to the level of the heart or above for 30 minutes daily and/or when sitting, a frequency of: - throughout the day Off-Loading: Turn and reposition every 2 hours Other: - float heels with bunny boots or pillows while rest in bed or chair to aid in offloading pressure to heels. Home Health: [place] skilled [facility] for wound care. - [facility] #1 I continued with the silver alginate although I had some thoughts about Hydrofera Blue 2. Patient continues on his IV antibiotics which should be the finish in 3 weeks when we see him the next time. This was IV vancomycin and Rocephin Electronic Signature(s) Signed: 09/05/2019 4:33:08 PM By: Linton Ham MD Entered By: Linton Ham on 09/05/2019 13:55:41 -------------------------------------------------------------------------------- SuperBill Details Patient Name: Date of Service: [name] NA RD G. 09/05/2019 Medical Record Number: [id number] Patient Account Number: [id number] Date of Birth/Sex: Treating RN: 1935-08-12 (84 y.o. 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.3 Idiopathic progressive neuropathy M86.672 Other chronic osteomyelitis, left ankle and  foot L89.322 Pressure ulcer of left buttock, stage 2 Facility Procedures CPT4 Code: [id number] Description: 77824 - CHEM CAUT GRANULATION TISS Modifier: Quantity: 1 Physician Procedures : CPT4 Code Description Modifier [id number] - WC PHYS LEVEL 3 - EST PT ICD-10 Diagnosis Description L89.620 Pressure ulcer of left heel, unstageable M08.676 Other chronic osteomyelitis, left ankle and foot Quantity: 1 Electronic Signature(s) Signed: 09/05/2019 4:21:43 PM By: Levan Hurst RN, BSN Signed: 09/05/2019 4:33:08 PM By: Linton Ham MD Entered By: Levan Hurst on 09/05/2019 14:22:27

## 2019-09-06 NOTE — Progress Notes (Signed)
MAKANI, SECKMAN ([id number]) Visit Report for 09/05/2019 Arrival Information Details Patient Name: Date of Service: [place] RD G. 09/05/2019 1:00 PM Medical Record Number: [id number] Patient Account Number: [id number] Date of Birth/Sex: Treating RN: 12-26-1935 (84 y.o. [name] Primary Care Janeene Sand: [employer] Other Clinician: Referring Dekendrick Uzelac: Treating Kristine Chahal/Extender: Garfield Cornea in Treatment: 10 Visit Information History Since Last Visit Added or deleted any medications: No Patient Arrived: Wheel Chair Any new allergies or adverse reactions: No Arrival Time: 13:16 Had a fall or experienced change in No Accompanied By: daughter activities of daily living that may affect Transfer Assistance: None risk of falls: Patient Identification Verified: Yes Signs or symptoms of abuse/neglect since last visito No Secondary Verification Process Completed: Yes Hospitalized since last visit: No Patient Requires Transmission-Based Precautions: No Implantable device outside of the clinic excluding No Patient Has Alerts: Yes cellular tissue based products placed in the center Patient Alerts: Left ABI: 0.77 since last visit: Has Dressing in Place as Prescribed: Yes Pain Present Now: No Electronic Signature(s) Signed: 09/05/2019 2:53:33 PM By: Sandre Kitty Entered By: Sandre Kitty on 09/05/2019 13:17:03 -------------------------------------------------------------------------------- Encounter Discharge Information Details Patient Name: Date of Service: [name] NA RD G. 09/05/2019 1:00 PM Medical Record Number: [id number] Patient Account Number: [id number] Date of Birth/Sex: Treating RN: [birth year] (84 y.o. [name] Primary Care Milam Allbaugh: [employer] Other Clinician: Referring Heru Montz: Treating Qiara Minetti/Extender: Garfield Cornea in Treatment: 10 Encounter Discharge Information  Items Discharge Condition: Stable Ambulatory Status: Wheelchair Discharge Destination: Home Transportation: Private Auto Accompanied By: daughter Schedule Follow-up Appointment: Yes Clinical Summary of Care: Patient Declined Electronic Signature(s) Signed: 09/05/2019 4:06:40 PM By: Kela Millin Entered By: Kela Millin on 09/05/2019 14:03:05 -------------------------------------------------------------------------------- Lower Extremity Assessment Details Patient Name: Date of Service: [name] NA RD G. 09/05/2019 1:00 PM Medical Record Number: [id number] Patient Account Number: [id number] Date of Birth/Sex: Treating RN: 04-30-1935 (84 y.o. [name] Primary Care Shailynn Fong: [employer] Other Clinician: Referring Khyan Oats: Treating Demarie Uhlig/Extender: Garfield Cornea in Treatment: 10 Edema Assessment Assessed: [name]: No] Patrice Paradise: No] Edema: [Left: Ye] [Right: s] Calf Left: Right: Point of Measurement: 39 cm From Medial Instep 34.5 cm cm Ankle Left: Right: Point of Measurement: 14 cm From Medial Instep 26 cm cm Vascular Assessment Pulses: Dorsalis Pedis Palpable: [Left:No] Electronic Signature(s) Signed: 09/05/2019 4:06:40 PM By: Kela Millin Entered By: Kela Millin on 09/05/2019 13:29:21 -------------------------------------------------------------------------------- Multi Wound Chart Details Patient Name: Date of Service: [name] NA RD G. 09/05/2019 1:00 PM Medical Record Number: [id number] Patient Account Number: [id number] Date of Birth/Sex: Treating RN: [birth year] (84 y.o. [name] Primary Care Zailyn Rowser: [employer] Other Clinician: Referring Devery Murgia: Treating Raysa Bosak/Extender: Garfield Cornea in Treatment: 10 Vital Signs Height(in): 71 Pulse(bpm): 63 Weight(lbs): 250 Blood Pressure(mmHg): 125/74 Body Mass Index(BMI): 35 Temperature(F): 98.5 Respiratory  Rate(breaths/min): 18 Photos: [1:No Photos Left, Lateral Calcaneus] [3:No Photos Left, Lateral, Plantar Foot] [N/A:N/A N/A] Wound Location: [1:Blister] [3:Trauma] [N/A:N/A] Wounding Event: [1:Pressure Ulcer] [3:Trauma, Other] [N/A:N/A] Primary Etiology: [1:05/12/2019] [3:08/22/2019] [N/A:N/A] Date Acquired: [1:10] [3:2] [N/A:N/A] Weeks of Treatment: [1:Open] [3:Open] [N/A:N/A] Wound Status: [1:2.6x1.7x0.9] [3:0x0x0] [N/A:N/A] Measurements L x W x D (cm) [1:3.471] [3:0] [N/A:N/A] A (cm) : rea [1:3.124] [3:0] [N/A:N/A] Volume (cm) : [1:63.90%] [3:100.00%] [N/A:N/A] % Reduction in Area: [1:79.70%] [3:100.00%] [N/A:N/A] % Reduction in Volume: [1:Category/Stage IV] [3:Full Thickness Without Exposed] [N/A:N/A] Classification: [1:Medium] [3:Support Structures None Present] [N/A:N/A] Exudate Amount: [1:Serosanguineous] [3:N/A] [N/A:N/A] Exudate Type: [1:red,  brown] [3:N/A] [N/A:N/A] Exudate Color: [1:Well defined, not attached] [3:Distinct, outline attached] [N/A:N/A] Wound Margin: [1:Large (67-100%)] [3:None Present (0%)] [N/A:N/A] Granulation Amount: [1:Pink, Hyper-granulation] [3:N/A] [N/A:N/A] Granulation Quality: [1:Small (1-33%)] [3:None Present (0%)] [N/A:N/A] Necrotic Amount: [1:Fat Layer (Subcutaneous Tissue): Yes Fascia: No] [N/A:N/A] Exposed Structures: [1:Fascia: No Tendon: No Muscle: No Joint: No Bone: No Small (1-33%)] [3:Fat Layer (Subcutaneous Tissue): No Tendon: No Muscle: No Joint: No Bone: No Large (67-100%)] [N/A:N/A] Epithelialization: [1:Chemical Cauterization] [3:N/A] [N/A:N/A] Treatment Notes Electronic Signature(s) Signed: 09/05/2019 4:21:43 PM By: Levan Hurst RN, BSN Signed: 09/05/2019 4:33:08 PM By: Linton Ham MD Entered By: Linton Ham on 09/05/2019 13:52:16 -------------------------------------------------------------------------------- Multi-Disciplinary Care Plan Details Patient Name: Date of Service: [name] NA RD G. 09/05/2019 1:00 PM Medical  Record Number: [id number] Patient Account Number: [id number] Date of Birth/Sex: Treating RN: [birth year] (84 y.o. [name] Primary Care Floyed Masoud: [employer] Other Clinician: Referring Alliah Boulanger: Treating France Noyce/Extender: Garfield Cornea in Treatment: 10 Active Inactive Wound/Skin Impairment Nursing Diagnoses: Knowledge deficit related to ulceration/compromised skin integrity Goals: Patient/caregiver will verbalize understanding of skin care regimen Date Initiated: 06/23/2019 Target Resolution Date: 10/07/2019 Goal Status: Active Interventions: Assess patient/caregiver ability to obtain necessary supplies Assess patient/caregiver ability to perform ulcer/skin care regimen upon admission and as needed Provide education on ulcer and skin care Treatment Activities: Skin care regimen initiated : 06/23/2019 Topical wound management initiated : 06/23/2019 Notes: Electronic Signature(s) Signed: 09/05/2019 4:21:43 PM By: Levan Hurst RN, BSN Entered By: Levan Hurst on 09/05/2019 13:27:29 -------------------------------------------------------------------------------- Pain Assessment Details Patient Name: Date of Service: [name] NA RD G. 09/05/2019 1:00 PM Medical Record Number: [id number] Patient Account Number: [id number] Date of Birth/Sex: Treating RN: [birth year] (83 y.o. [name] Primary Care Keilan Nichol: [employer] Other Clinician: Referring Shirlene Andaya: Treating Sadat Sliwa/Extender: Garfield Cornea in Treatment: 10 Active Problems Location of Pain Severity and Description of Pain Patient Has Paino No Site Locations Pain Management and Medication Current Pain Management: Electronic Signature(s) Signed: 09/05/2019 2:53:33 PM By: Sandre Kitty Signed: 09/05/2019 4:21:43 PM By: Levan Hurst RN, BSN Entered By: Sandre Kitty on 09/05/2019  13:17:28 -------------------------------------------------------------------------------- Patient/Caregiver Education Details Patient Name: Date of Service: [name] NA RD G. 8/23/2021andnbsp1:00 PM Medical Record Number: [id number] Patient Account Number: [id number] Date of Birth/Gender: Treating RN: 01/10/1936 (84 y.o. [name] Primary Care Physician: [employer] Other Clinician: Referring Physician: Treating Physician/Extender: Garfield Cornea in Treatment: 10 Education Assessment Education Provided To: Patient Education Topics Provided Wound/Skin Impairment: Methods: Explain/Verbal Responses: State content correctly [employer]) Signed: 09/05/2019 4:21:43 PM By: Levan Hurst RN, BSN Entered By: Levan Hurst on 09/05/2019 13:27:44 -------------------------------------------------------------------------------- Wound Assessment Details Patient Name: Date of Service: [name] NA RD G. 09/05/2019 1:00 PM Medical Record Number: [id number] Patient Account Number: [id number] Date of Birth/Sex: Treating RN: 1935/03/27 (84 y.o. [name] Primary Care Katheline Brendlinger: [employer] Other Clinician: Referring Kylani Wires: Treating Jacques Fife/Extender: Garfield Cornea in Treatment: 10 Wound Status Wound Number: 1 Primary Etiology: Pressure Ulcer Wound Location: Left, Lateral Calcaneus Wound Status: Open Wounding Event: Blister Date Acquired: 05/12/2019 Weeks Of Treatment: 10 Clustered Wound: No Wound Measurements Length: (cm) 2.6 Width: (cm) 1.7 Depth: (cm) 0.9 Area: (cm) 3.471 Volume: (cm) 3.124 % Reduction in Area: 63.9% % Reduction in Volume: 79.7% Epithelialization: Small (1-33%) Tunneling: No Undermining: No Wound Description Classification: Category/Stage IV Wound Margin: Well defined, not attached Exudate Amount: Medium Exudate Type: Serosanguineous Exudate Color: red, brown Foul Odor  After Cleansing:  No Slough/Fibrino Yes Wound Bed Granulation Amount: Large (67-100%) Exposed Structure Granulation Quality: Pink, Hyper-granulation Fascia Exposed: No Necrotic Amount: Small (1-33%) Fat Layer (Subcutaneous Tissue) Exposed: Yes Necrotic Quality: Adherent Slough Tendon Exposed: No Muscle Exposed: No Joint Exposed: No Bone Exposed: No Treatment Notes Wound #1 (Left, Lateral Calcaneus) 1. Cleanse With Wound Cleanser 3. Primary Dressing Applied Calcium Alginate Ag 4. Secondary Dressing Dry Gauze Roll Gauze Heel Cup 5. Secured With Tape Notes Horticulturist, commercial) Signed: 09/05/2019 4:06:40 PM By: Kela Millin Signed: 09/05/2019 4:21:43 PM By: Levan Hurst RN, BSN Entered By: Kela Millin on 09/05/2019 13:30:21 -------------------------------------------------------------------------------- Wound Assessment Details Patient Name: Date of Service: [name] NA RD G. 09/05/2019 1:00 PM Medical Record Number: [id number] Patient Account Number: [id number] Date of Birth/Sex: Treating RN: 1935/07/09 (84 y.o. [name] Primary Care Dmitriy Gair: [employer] Other Clinician: Referring Terianne Thaker: Treating Sade Hollon/Extender: Garfield Cornea in Treatment: 10 Wound Status Wound Number: 3 Primary Etiology: Trauma, Other Wound Location: Left, Lateral, Plantar Foot Wound Status: Open Wounding Event: Trauma Date Acquired: 08/22/2019 Weeks Of Treatment: 2 Clustered Wound: No Wound Measurements Length: (cm) Width: (cm) Depth: (cm) Area: (cm) Volume: (cm) 0 % Reduction in Area: 100% 0 % Reduction in Volume: 100% 0 Epithelialization: Large (67-100%) 0 Tunneling: No 0 Undermining: No Wound Description Classification: Full Thickness Without Exposed Support Structures Wound Margin: Distinct, outline attached Exudate Amount: None Present Foul Odor After Cleansing: No Slough/Fibrino No Wound Bed Granulation  Amount: None Present (0%) Exposed Structure Necrotic Amount: None Present (0%) Fascia Exposed: No Fat Layer (Subcutaneous Tissue) Exposed: No Tendon Exposed: No Muscle Exposed: No Joint Exposed: No Bone Exposed: No Electronic Signature(s) Signed: 09/05/2019 4:06:40 PM By: Kela Millin Signed: 09/05/2019 4:21:43 PM By: Levan Hurst RN, BSN Entered By: Kela Millin on 09/05/2019 13:29:59 -------------------------------------------------------------------------------- [place] Details Patient Name: Date of Service: [name] NA RD G. 09/05/2019 1:00 PM Medical Record Number: [id number] Patient Account Number: [id number] Date of Birth/Sex: Treating RN: April 03, 1935 (84 y.o. [name] Primary Care Ezell Poke: [employer] Other Clinician: Referring Braedin Millhouse: Treating Edahi Kroening/Extender: Garfield Cornea in Treatment: 10 Vital Signs Time Taken: 13:17 Temperature (F): 98.5 Height (in): 71 Pulse (bpm): 67 Weight (lbs): 250 Respiratory Rate (breaths/min): 18 Body Mass Index (BMI): 34.9 Blood Pressure (mmHg): 125/74 Reference Range: 80 - 120 mg / dl Electronic Signature(s) Signed: 09/05/2019 2:53:33 PM By: Sandre Kitty Entered By: Sandre Kitty on 09/05/2019 13:17:20

## 2019-09-07 ENCOUNTER — Ambulatory Visit: Payer: Medicare Other | Admitting: Urology

## 2019-09-07 DIAGNOSIS — M25422 Effusion, left elbow: Secondary | ICD-10-CM | POA: Diagnosis not present

## 2019-09-07 DIAGNOSIS — I639 Cerebral infarction, unspecified: Secondary | ICD-10-CM | POA: Diagnosis not present

## 2019-09-07 DIAGNOSIS — M86172 Other acute osteomyelitis, left ankle and foot: Secondary | ICD-10-CM | POA: Diagnosis not present

## 2019-09-08 DIAGNOSIS — I129 Hypertensive chronic kidney disease with stage 1 through stage 4 chronic kidney disease, or unspecified chronic kidney disease: Secondary | ICD-10-CM | POA: Diagnosis not present

## 2019-09-08 DIAGNOSIS — M25422 Effusion, left elbow: Secondary | ICD-10-CM | POA: Diagnosis not present

## 2019-09-08 DIAGNOSIS — I69391 Dysphagia following cerebral infarction: Secondary | ICD-10-CM | POA: Diagnosis not present

## 2019-09-08 DIAGNOSIS — L89620 Pressure ulcer of left heel, unstageable: Secondary | ICD-10-CM | POA: Diagnosis not present

## 2019-09-08 DIAGNOSIS — Z5181 Encounter for therapeutic drug level monitoring: Secondary | ICD-10-CM | POA: Diagnosis not present

## 2019-09-08 DIAGNOSIS — M159 Polyosteoarthritis, unspecified: Secondary | ICD-10-CM | POA: Diagnosis not present

## 2019-09-08 DIAGNOSIS — N182 Chronic kidney disease, stage 2 (mild): Secondary | ICD-10-CM | POA: Diagnosis not present

## 2019-09-08 DIAGNOSIS — R1310 Dysphagia, unspecified: Secondary | ICD-10-CM | POA: Diagnosis not present

## 2019-09-08 DIAGNOSIS — E785 Hyperlipidemia, unspecified: Secondary | ICD-10-CM | POA: Diagnosis not present

## 2019-09-08 DIAGNOSIS — I69954 Hemiplegia and hemiparesis following unspecified cerebrovascular disease affecting left non-dominant side: Secondary | ICD-10-CM | POA: Diagnosis not present

## 2019-09-08 DIAGNOSIS — M869 Osteomyelitis, unspecified: Secondary | ICD-10-CM | POA: Diagnosis not present

## 2019-09-08 DIAGNOSIS — M86172 Other acute osteomyelitis, left ankle and foot: Secondary | ICD-10-CM | POA: Diagnosis not present

## 2019-09-08 DIAGNOSIS — I639 Cerebral infarction, unspecified: Secondary | ICD-10-CM | POA: Diagnosis not present

## 2019-09-08 DIAGNOSIS — Z720 Tobacco use: Secondary | ICD-10-CM | POA: Diagnosis not present

## 2019-09-09 DIAGNOSIS — M86172 Other acute osteomyelitis, left ankle and foot: Secondary | ICD-10-CM | POA: Diagnosis not present

## 2019-09-09 DIAGNOSIS — I639 Cerebral infarction, unspecified: Secondary | ICD-10-CM | POA: Diagnosis not present

## 2019-09-09 DIAGNOSIS — M25422 Effusion, left elbow: Secondary | ICD-10-CM | POA: Diagnosis not present

## 2019-09-10 DIAGNOSIS — M25422 Effusion, left elbow: Secondary | ICD-10-CM | POA: Diagnosis not present

## 2019-09-10 DIAGNOSIS — L8990 Pressure ulcer of unspecified site, unspecified stage: Secondary | ICD-10-CM | POA: Diagnosis not present

## 2019-09-10 DIAGNOSIS — R1310 Dysphagia, unspecified: Secondary | ICD-10-CM | POA: Diagnosis not present

## 2019-09-10 DIAGNOSIS — I639 Cerebral infarction, unspecified: Secondary | ICD-10-CM | POA: Diagnosis not present

## 2019-09-10 DIAGNOSIS — M86172 Other acute osteomyelitis, left ankle and foot: Secondary | ICD-10-CM | POA: Diagnosis not present

## 2019-09-11 DIAGNOSIS — M25422 Effusion, left elbow: Secondary | ICD-10-CM | POA: Diagnosis not present

## 2019-09-11 DIAGNOSIS — M86172 Other acute osteomyelitis, left ankle and foot: Secondary | ICD-10-CM | POA: Diagnosis not present

## 2019-09-11 DIAGNOSIS — I639 Cerebral infarction, unspecified: Secondary | ICD-10-CM | POA: Diagnosis not present

## 2019-09-12 DIAGNOSIS — M86172 Other acute osteomyelitis, left ankle and foot: Secondary | ICD-10-CM | POA: Diagnosis not present

## 2019-09-12 DIAGNOSIS — L89620 Pressure ulcer of left heel, unstageable: Secondary | ICD-10-CM | POA: Diagnosis not present

## 2019-09-12 DIAGNOSIS — I639 Cerebral infarction, unspecified: Secondary | ICD-10-CM | POA: Diagnosis not present

## 2019-09-12 DIAGNOSIS — N182 Chronic kidney disease, stage 2 (mild): Secondary | ICD-10-CM | POA: Diagnosis not present

## 2019-09-12 DIAGNOSIS — R1310 Dysphagia, unspecified: Secondary | ICD-10-CM | POA: Diagnosis not present

## 2019-09-12 DIAGNOSIS — Z720 Tobacco use: Secondary | ICD-10-CM | POA: Diagnosis not present

## 2019-09-12 DIAGNOSIS — I129 Hypertensive chronic kidney disease with stage 1 through stage 4 chronic kidney disease, or unspecified chronic kidney disease: Secondary | ICD-10-CM | POA: Diagnosis not present

## 2019-09-12 DIAGNOSIS — M159 Polyosteoarthritis, unspecified: Secondary | ICD-10-CM | POA: Diagnosis not present

## 2019-09-12 DIAGNOSIS — M869 Osteomyelitis, unspecified: Secondary | ICD-10-CM | POA: Diagnosis not present

## 2019-09-12 DIAGNOSIS — H6121 Impacted cerumen, right ear: Secondary | ICD-10-CM | POA: Diagnosis not present

## 2019-09-12 DIAGNOSIS — I69391 Dysphagia following cerebral infarction: Secondary | ICD-10-CM | POA: Diagnosis not present

## 2019-09-12 DIAGNOSIS — G894 Chronic pain syndrome: Secondary | ICD-10-CM | POA: Diagnosis not present

## 2019-09-12 DIAGNOSIS — M1711 Unilateral primary osteoarthritis, right knee: Secondary | ICD-10-CM | POA: Diagnosis not present

## 2019-09-12 DIAGNOSIS — I10 Essential (primary) hypertension: Secondary | ICD-10-CM | POA: Diagnosis not present

## 2019-09-12 DIAGNOSIS — I69954 Hemiplegia and hemiparesis following unspecified cerebrovascular disease affecting left non-dominant side: Secondary | ICD-10-CM | POA: Diagnosis not present

## 2019-09-12 DIAGNOSIS — E785 Hyperlipidemia, unspecified: Secondary | ICD-10-CM | POA: Diagnosis not present

## 2019-09-12 DIAGNOSIS — M25422 Effusion, left elbow: Secondary | ICD-10-CM | POA: Diagnosis not present

## 2019-09-12 DIAGNOSIS — Z5181 Encounter for therapeutic drug level monitoring: Secondary | ICD-10-CM | POA: Diagnosis not present

## 2019-09-13 DIAGNOSIS — I639 Cerebral infarction, unspecified: Secondary | ICD-10-CM | POA: Diagnosis not present

## 2019-09-13 DIAGNOSIS — M25422 Effusion, left elbow: Secondary | ICD-10-CM | POA: Diagnosis not present

## 2019-09-13 DIAGNOSIS — M86172 Other acute osteomyelitis, left ankle and foot: Secondary | ICD-10-CM | POA: Diagnosis not present

## 2019-09-13 DIAGNOSIS — R1310 Dysphagia, unspecified: Secondary | ICD-10-CM | POA: Diagnosis not present

## 2019-09-13 DIAGNOSIS — L8990 Pressure ulcer of unspecified site, unspecified stage: Secondary | ICD-10-CM | POA: Diagnosis not present

## 2019-09-14 DIAGNOSIS — M86172 Other acute osteomyelitis, left ankle and foot: Secondary | ICD-10-CM | POA: Diagnosis not present

## 2019-09-14 DIAGNOSIS — M25422 Effusion, left elbow: Secondary | ICD-10-CM | POA: Diagnosis not present

## 2019-09-14 DIAGNOSIS — I639 Cerebral infarction, unspecified: Secondary | ICD-10-CM | POA: Diagnosis not present

## 2019-09-15 DIAGNOSIS — E785 Hyperlipidemia, unspecified: Secondary | ICD-10-CM | POA: Diagnosis not present

## 2019-09-15 DIAGNOSIS — I69391 Dysphagia following cerebral infarction: Secondary | ICD-10-CM | POA: Diagnosis not present

## 2019-09-15 DIAGNOSIS — L89620 Pressure ulcer of left heel, unstageable: Secondary | ICD-10-CM | POA: Diagnosis not present

## 2019-09-15 DIAGNOSIS — I129 Hypertensive chronic kidney disease with stage 1 through stage 4 chronic kidney disease, or unspecified chronic kidney disease: Secondary | ICD-10-CM | POA: Diagnosis not present

## 2019-09-15 DIAGNOSIS — M86172 Other acute osteomyelitis, left ankle and foot: Secondary | ICD-10-CM | POA: Diagnosis not present

## 2019-09-15 DIAGNOSIS — M869 Osteomyelitis, unspecified: Secondary | ICD-10-CM | POA: Diagnosis not present

## 2019-09-15 DIAGNOSIS — Z720 Tobacco use: Secondary | ICD-10-CM | POA: Diagnosis not present

## 2019-09-15 DIAGNOSIS — I639 Cerebral infarction, unspecified: Secondary | ICD-10-CM | POA: Diagnosis not present

## 2019-09-15 DIAGNOSIS — I69954 Hemiplegia and hemiparesis following unspecified cerebrovascular disease affecting left non-dominant side: Secondary | ICD-10-CM | POA: Diagnosis not present

## 2019-09-15 DIAGNOSIS — Z5181 Encounter for therapeutic drug level monitoring: Secondary | ICD-10-CM | POA: Diagnosis not present

## 2019-09-15 DIAGNOSIS — M25422 Effusion, left elbow: Secondary | ICD-10-CM | POA: Diagnosis not present

## 2019-09-15 DIAGNOSIS — M159 Polyosteoarthritis, unspecified: Secondary | ICD-10-CM | POA: Diagnosis not present

## 2019-09-15 DIAGNOSIS — N182 Chronic kidney disease, stage 2 (mild): Secondary | ICD-10-CM | POA: Diagnosis not present

## 2019-09-15 DIAGNOSIS — R1310 Dysphagia, unspecified: Secondary | ICD-10-CM | POA: Diagnosis not present

## 2019-09-16 DIAGNOSIS — I639 Cerebral infarction, unspecified: Secondary | ICD-10-CM | POA: Diagnosis not present

## 2019-09-16 DIAGNOSIS — M25422 Effusion, left elbow: Secondary | ICD-10-CM | POA: Diagnosis not present

## 2019-09-16 DIAGNOSIS — M86172 Other acute osteomyelitis, left ankle and foot: Secondary | ICD-10-CM | POA: Diagnosis not present

## 2019-09-17 DIAGNOSIS — M86172 Other acute osteomyelitis, left ankle and foot: Secondary | ICD-10-CM | POA: Diagnosis not present

## 2019-09-17 DIAGNOSIS — M25422 Effusion, left elbow: Secondary | ICD-10-CM | POA: Diagnosis not present

## 2019-09-17 DIAGNOSIS — I639 Cerebral infarction, unspecified: Secondary | ICD-10-CM | POA: Diagnosis not present

## 2019-09-18 DIAGNOSIS — M86172 Other acute osteomyelitis, left ankle and foot: Secondary | ICD-10-CM | POA: Diagnosis not present

## 2019-09-18 DIAGNOSIS — I639 Cerebral infarction, unspecified: Secondary | ICD-10-CM | POA: Diagnosis not present

## 2019-09-18 DIAGNOSIS — M25422 Effusion, left elbow: Secondary | ICD-10-CM | POA: Diagnosis not present

## 2019-09-19 DIAGNOSIS — M25422 Effusion, left elbow: Secondary | ICD-10-CM | POA: Diagnosis not present

## 2019-09-19 DIAGNOSIS — M86172 Other acute osteomyelitis, left ankle and foot: Secondary | ICD-10-CM | POA: Diagnosis not present

## 2019-09-19 DIAGNOSIS — I639 Cerebral infarction, unspecified: Secondary | ICD-10-CM | POA: Diagnosis not present

## 2019-09-20 DIAGNOSIS — I69391 Dysphagia following cerebral infarction: Secondary | ICD-10-CM | POA: Diagnosis not present

## 2019-09-20 DIAGNOSIS — I639 Cerebral infarction, unspecified: Secondary | ICD-10-CM | POA: Diagnosis not present

## 2019-09-20 DIAGNOSIS — Z5181 Encounter for therapeutic drug level monitoring: Secondary | ICD-10-CM | POA: Diagnosis not present

## 2019-09-20 DIAGNOSIS — N182 Chronic kidney disease, stage 2 (mild): Secondary | ICD-10-CM | POA: Diagnosis not present

## 2019-09-20 DIAGNOSIS — I69954 Hemiplegia and hemiparesis following unspecified cerebrovascular disease affecting left non-dominant side: Secondary | ICD-10-CM | POA: Diagnosis not present

## 2019-09-20 DIAGNOSIS — L89620 Pressure ulcer of left heel, unstageable: Secondary | ICD-10-CM | POA: Diagnosis not present

## 2019-09-20 DIAGNOSIS — E785 Hyperlipidemia, unspecified: Secondary | ICD-10-CM | POA: Diagnosis not present

## 2019-09-20 DIAGNOSIS — Z720 Tobacco use: Secondary | ICD-10-CM | POA: Diagnosis not present

## 2019-09-20 DIAGNOSIS — I129 Hypertensive chronic kidney disease with stage 1 through stage 4 chronic kidney disease, or unspecified chronic kidney disease: Secondary | ICD-10-CM | POA: Diagnosis not present

## 2019-09-20 DIAGNOSIS — M86172 Other acute osteomyelitis, left ankle and foot: Secondary | ICD-10-CM | POA: Diagnosis not present

## 2019-09-20 DIAGNOSIS — R1310 Dysphagia, unspecified: Secondary | ICD-10-CM | POA: Diagnosis not present

## 2019-09-20 DIAGNOSIS — M159 Polyosteoarthritis, unspecified: Secondary | ICD-10-CM | POA: Diagnosis not present

## 2019-09-20 DIAGNOSIS — M869 Osteomyelitis, unspecified: Secondary | ICD-10-CM | POA: Diagnosis not present

## 2019-09-20 DIAGNOSIS — M25422 Effusion, left elbow: Secondary | ICD-10-CM | POA: Diagnosis not present

## 2019-09-21 DIAGNOSIS — M86172 Other acute osteomyelitis, left ankle and foot: Secondary | ICD-10-CM | POA: Diagnosis not present

## 2019-09-21 DIAGNOSIS — I639 Cerebral infarction, unspecified: Secondary | ICD-10-CM | POA: Diagnosis not present

## 2019-09-21 DIAGNOSIS — M25422 Effusion, left elbow: Secondary | ICD-10-CM | POA: Diagnosis not present

## 2019-09-22 ENCOUNTER — Telehealth: Payer: Self-pay

## 2019-09-22 ENCOUNTER — Ambulatory Visit: Payer: Medicare Other | Admitting: Internal Medicine

## 2019-09-22 ENCOUNTER — Other Ambulatory Visit: Payer: Self-pay

## 2019-09-22 VITALS — BP 105/70 | HR 71

## 2019-09-22 DIAGNOSIS — E785 Hyperlipidemia, unspecified: Secondary | ICD-10-CM | POA: Diagnosis not present

## 2019-09-22 DIAGNOSIS — M25422 Effusion, left elbow: Secondary | ICD-10-CM | POA: Diagnosis not present

## 2019-09-22 DIAGNOSIS — I69391 Dysphagia following cerebral infarction: Secondary | ICD-10-CM | POA: Diagnosis not present

## 2019-09-22 DIAGNOSIS — M869 Osteomyelitis, unspecified: Secondary | ICD-10-CM | POA: Diagnosis not present

## 2019-09-22 DIAGNOSIS — N182 Chronic kidney disease, stage 2 (mild): Secondary | ICD-10-CM | POA: Diagnosis not present

## 2019-09-22 DIAGNOSIS — L89620 Pressure ulcer of left heel, unstageable: Secondary | ICD-10-CM | POA: Diagnosis not present

## 2019-09-22 DIAGNOSIS — M86672 Other chronic osteomyelitis, left ankle and foot: Secondary | ICD-10-CM | POA: Diagnosis not present

## 2019-09-22 DIAGNOSIS — M86172 Other acute osteomyelitis, left ankle and foot: Secondary | ICD-10-CM | POA: Diagnosis not present

## 2019-09-22 DIAGNOSIS — M159 Polyosteoarthritis, unspecified: Secondary | ICD-10-CM | POA: Diagnosis not present

## 2019-09-22 DIAGNOSIS — Z720 Tobacco use: Secondary | ICD-10-CM | POA: Diagnosis not present

## 2019-09-22 DIAGNOSIS — I129 Hypertensive chronic kidney disease with stage 1 through stage 4 chronic kidney disease, or unspecified chronic kidney disease: Secondary | ICD-10-CM | POA: Diagnosis not present

## 2019-09-22 DIAGNOSIS — Z5181 Encounter for therapeutic drug level monitoring: Secondary | ICD-10-CM | POA: Diagnosis not present

## 2019-09-22 DIAGNOSIS — R1310 Dysphagia, unspecified: Secondary | ICD-10-CM | POA: Diagnosis not present

## 2019-09-22 DIAGNOSIS — I69954 Hemiplegia and hemiparesis following unspecified cerebrovascular disease affecting left non-dominant side: Secondary | ICD-10-CM | POA: Diagnosis not present

## 2019-09-22 DIAGNOSIS — I639 Cerebral infarction, unspecified: Secondary | ICD-10-CM | POA: Diagnosis not present

## 2019-09-22 NOTE — Telephone Encounter (Signed)
Received call from [place] from [facility] for lab drawn review.   Draw date 9/7 Cr: 1.34 on 9/2 Cr was 1.13 Drawn on 9/2 Sed rate 9 CRP 5  Jeani Hawking requesting pull picc order after MD reviews labs Routing to Dr. Baxter Flattery for advise.  Eugenia Mcalpine

## 2019-09-22 NOTE — Progress Notes (Signed)
RFV: follow up for left heel osteomyelitis  Patient ID: Curtis Jenkins, male   DOB: 05/29/1935, 84 y.o.   MRN: [id number]  HPI 84yo M with left heel osteo on vanco and ceftriaxone which he finishes this week. Left heel is healing slowly but showing some improvement. Little drainage on bandage. Has tolerated abtx without difficulty. No pain at picc line  Outpatient Encounter Medications as of 09/22/2019  Medication Sig  . amLODipine (NORVASC) 5 MG tablet Take 5 mg by mouth daily.  Marland Kitchen apixaban (ELIQUIS) 5 MG TABS tablet Take 2.5 mg by mouth 2 (two) times daily.  . calcium-vitamin D (OSCAL WITH D) 500-200 MG-UNIT tablet Take 1 tablet by mouth 2 (two) times daily.  . cefTRIAXone (ROCEPHIN) IVPB Inject 2 g into the vein. 2g in Sodium Chloride   100  ml  . clotrimazole-betamethasone (LOTRISONE) cream Apply 1 application topically 2 (two) times daily as needed (irritation/rash.).   Marland Kitchen feeding supplement, ENSURE ENLIVE, (ENSURE ENLIVE) LIQD Take 237 mLs by mouth 3 (three) times daily after meals. (Patient taking differently: Take 237 mLs by mouth daily. )  . Magnesium 200 MG TABS Take 200 mg by mouth in the morning and at bedtime.   . Methylcellulose, Laxative, (FIBER THERAPY) 500 MG TABS Take 500 mg by mouth in the morning and at bedtime.  . rosuvastatin (CRESTOR) 20 MG tablet Take 20 mg by mouth every evening.   . traMADol (ULTRAM) 50 MG tablet Take 50 mg by mouth 3 (three) times daily as needed (pain.).   Marland Kitchen vancomycin IVPB Inject into the vein.  Marland Kitchen zolpidem (AMBIEN) 10 MG tablet Take 10 mg by mouth at bedtime.    No facility-administered encounter medications on file as of 09/22/2019.     Patient Active Problem List   Diagnosis Date Noted  . PAF (paroxysmal atrial fibrillation) ([place]) 05/05/2019  . Effusion of left elbow 04/12/2019  . Chronic left shoulder pain 04/12/2019  . Rt Occipital Lobe Stroke with Lt hemiparesis 04/09/2019  . Dysphagia following cerebrovascular accident 04/09/2019  .  Speech disturbance due to Acute CVA 04/09/2019  . Tobacco abuse-Chews Tobacco 04/09/2019  . Paresthesia   . Hypocalcemia 04/03/2019  . Prolonged QT interval 04/03/2019  . HTN (hypertension) 04/03/2019  . HLD (hyperlipidemia) 04/03/2019  . Weakness 04/03/2019     Health Maintenance Due  Topic Date Due  . URINE MICROALBUMIN  Never done  . COVID-19 Vaccine (1) Never done  . TETANUS/TDAP  Never done  . PNA vac Low Risk Adult (1 of 2 - PCV13) Never done  . INFLUENZA VACCINE  08/14/2019     Review of Systems Review of Systems  Constitutional: Negative for fever, chills, diaphoresis, activity change, appetite change, fatigue and unexpected weight change.  HENT: Negative for congestion, sore throat, rhinorrhea, sneezing, trouble swallowing and sinus pressure.  Eyes: Negative for photophobia and visual disturbance.  Respiratory: Negative for cough, chest tightness, shortness of breath, wheezing and stridor.  Cardiovascular: Negative for chest pain, palpitations and leg swelling.  Gastrointestinal: Negative for nausea, vomiting, abdominal pain, diarrhea, constipation, blood in stool, abdominal distention and anal bleeding.  Genitourinary: Negative for dysuria, hematuria, flank pain and difficulty urinating.  Musculoskeletal: Negative for myalgias, back pain, joint swelling, arthralgias and gait problem.  Skin: +wound Neurological: Negative for dizziness, tremors, weakness and light-headedness.  Hematological: Negative for adenopathy. Does not bruise/bleed easily.  Psychiatric/Behavioral: Negative for behavioral problems, confusion, sleep disturbance, dysphoric mood, decreased concentration and agitation.   Physical Exam   BP 105/70  Pulse 71    Physical Exam  Constitutional: He is oriented to person, place, and time. He appears well-developed and well-nourished. No distress.  HENT:  Mouth/Throat: Oropharynx is clear and moist. No oropharyngeal exudate.  Cardiovascular: Normal rate,  regular rhythm and normal heart sounds. Exam reveals no gallop and no friction rub.  No murmur heard.  Pulmonary/Chest: Effort normal and breath sounds normal. No respiratory distress. He has no wheezes.  Abdominal: Soft. Bowel sounds are normal. He exhibits no distension. There is no tenderness.  Lymphadenopathy:  He has no cervical adenopathy.  Neurological: He is alert and oriented to person, place, and time.  Skin: Skin is warm and dry. No rash noted. No erythema.  Ext: left heel lateral aspect with peeling dried eschar Psychiatric: He has a normal mood and affect. His behavior is normal.    CBC Lab Results  Component Value Date   WBC 6.8 08/09/2019   RBC 4.24 08/09/2019   HGB 12.4 (L) 08/09/2019   HCT 40.0 08/09/2019   PLT 310 08/09/2019   MCV 94.3 08/09/2019   MCH 29.2 08/09/2019   MCHC 31.0 08/09/2019   RDW 13.5 08/09/2019   LYMPHSABS 1.5 08/09/2019   MONOABS 0.7 08/09/2019   EOSABS 0.6 (H) 08/09/2019    BMET Lab Results  Component Value Date   NA 138 08/09/2019   K 4.2 08/09/2019   CL 101 08/09/2019   CO2 28 08/09/2019   GLUCOSE 103 (H) 08/09/2019   BUN 13 08/09/2019   CREATININE 1.21 08/09/2019   CALCIUM 8.5 (L) 08/09/2019   GFRNONAA 55 (L) 08/09/2019   GFRAA >60 08/09/2019   Erythrocyte Sedimentation Rate     Component Value Date/Time   ESRSEDRATE 46 (H) 08/10/2019 1555   C-reactive protein Lab Results  Component Value Date   CRP 14.5 (H) 08/10/2019      Assessment and Plan  Will check sed rate and crp to decide if need to extend by 2 addn weeks  Will check cr while on vanco  --- Addendum = sed rate nearly normalized. Will transition to oral abtx (doxy plus amox/clav) for 4 wk. Have him follow up at 4 wk

## 2019-09-22 NOTE — Progress Notes (Signed)
Spoke with Jeani Hawking at [facility] Draw date 9/7 Cr: 1.34 on 9/2 Cr was 1.13 Drawn on 9/2 Sed rate 9 CRP 5  Jeani Hawking requesting pull picc order after MD reviews labs [employer]

## 2019-09-23 DIAGNOSIS — M25422 Effusion, left elbow: Secondary | ICD-10-CM | POA: Diagnosis not present

## 2019-09-23 DIAGNOSIS — M86172 Other acute osteomyelitis, left ankle and foot: Secondary | ICD-10-CM | POA: Diagnosis not present

## 2019-09-23 DIAGNOSIS — I639 Cerebral infarction, unspecified: Secondary | ICD-10-CM | POA: Diagnosis not present

## 2019-09-23 LAB — BASIC METABOLIC PANEL
BUN/Creatinine Ratio: 13 (calc) (ref 6–22)
BUN: 18 mg/dL (ref 7–25)
CO2: 26 mmol/L (ref 20–32)
Calcium: 7.9 mg/dL — ABNORMAL LOW (ref 8.6–10.3)
Chloride: 101 mmol/L (ref 98–110)
Creat: 1.41 mg/dL — ABNORMAL HIGH (ref 0.70–1.11)
Glucose, Bld: 89 mg/dL (ref 65–99)
Potassium: 4.3 mmol/L (ref 3.5–5.3)
Sodium: 137 mmol/L (ref 135–146)

## 2019-09-23 LAB — SEDIMENTATION RATE: Sed Rate: 25 mm/h — ABNORMAL HIGH (ref 0–20)

## 2019-09-23 LAB — C-REACTIVE PROTEIN: CRP: 35.4 mg/L — ABNORMAL HIGH (ref <8.0)

## 2019-09-23 NOTE — Telephone Encounter (Signed)
Per Dr. Baxter Flattery called advance with orders to pull picc based off labs. Jeani Hawking was made aware. [place]

## 2019-09-26 ENCOUNTER — Encounter (HOSPITAL_BASED_OUTPATIENT_CLINIC_OR_DEPARTMENT_OTHER): Payer: Medicare Other | Attending: Internal Medicine | Admitting: Internal Medicine

## 2019-09-26 ENCOUNTER — Other Ambulatory Visit: Payer: Self-pay

## 2019-09-26 ENCOUNTER — Ambulatory Visit (INDEPENDENT_AMBULATORY_CARE_PROVIDER_SITE_OTHER): Payer: Medicare Other | Admitting: *Deleted

## 2019-09-26 DIAGNOSIS — I63531 Cerebral infarction due to unspecified occlusion or stenosis of right posterior cerebral artery: Secondary | ICD-10-CM

## 2019-09-26 DIAGNOSIS — I69954 Hemiplegia and hemiparesis following unspecified cerebrovascular disease affecting left non-dominant side: Secondary | ICD-10-CM | POA: Diagnosis not present

## 2019-09-26 DIAGNOSIS — R1310 Dysphagia, unspecified: Secondary | ICD-10-CM | POA: Diagnosis not present

## 2019-09-26 DIAGNOSIS — I129 Hypertensive chronic kidney disease with stage 1 through stage 4 chronic kidney disease, or unspecified chronic kidney disease: Secondary | ICD-10-CM | POA: Diagnosis not present

## 2019-09-26 DIAGNOSIS — N182 Chronic kidney disease, stage 2 (mild): Secondary | ICD-10-CM | POA: Diagnosis not present

## 2019-09-26 DIAGNOSIS — M159 Polyosteoarthritis, unspecified: Secondary | ICD-10-CM | POA: Diagnosis not present

## 2019-09-26 DIAGNOSIS — I69391 Dysphagia following cerebral infarction: Secondary | ICD-10-CM | POA: Diagnosis not present

## 2019-09-26 DIAGNOSIS — E785 Hyperlipidemia, unspecified: Secondary | ICD-10-CM | POA: Diagnosis not present

## 2019-09-26 DIAGNOSIS — M25422 Effusion, left elbow: Secondary | ICD-10-CM | POA: Diagnosis not present

## 2019-09-26 DIAGNOSIS — L89322 Pressure ulcer of left buttock, stage 2: Secondary | ICD-10-CM | POA: Diagnosis not present

## 2019-09-26 DIAGNOSIS — Z720 Tobacco use: Secondary | ICD-10-CM | POA: Diagnosis not present

## 2019-09-26 DIAGNOSIS — G603 Idiopathic progressive neuropathy: Secondary | ICD-10-CM | POA: Diagnosis not present

## 2019-09-26 DIAGNOSIS — M86672 Other chronic osteomyelitis, left ankle and foot: Secondary | ICD-10-CM | POA: Insufficient documentation

## 2019-09-26 DIAGNOSIS — Z923 Personal history of irradiation: Secondary | ICD-10-CM | POA: Diagnosis not present

## 2019-09-26 DIAGNOSIS — L89624 Pressure ulcer of left heel, stage 4: Secondary | ICD-10-CM | POA: Diagnosis not present

## 2019-09-26 DIAGNOSIS — Z8546 Personal history of malignant neoplasm of prostate: Secondary | ICD-10-CM | POA: Diagnosis not present

## 2019-09-26 DIAGNOSIS — L89620 Pressure ulcer of left heel, unstageable: Secondary | ICD-10-CM | POA: Insufficient documentation

## 2019-09-26 LAB — CUP PACEART REMOTE DEVICE CHECK
Date Time Interrogation Session: 20210913110222
Implantable Pulse Generator Implant Date: 20210330

## 2019-09-28 NOTE — Progress Notes (Signed)
Curtis Jenkins, Curtis Jenkins ([id number]) Visit Report for 09/26/2019 Debridement Details Patient Name: Date of Service: Curtis [name] 09/26/2019 3:15 PM Medical Record Number: [id number] Patient Account Number: [id number] Date of Birth/Sex: Treating RN: 10/14/1935 (84 y.o. Curtis Jenkins Other Clinician: Referring Provider: Treating Provider/Extender: Curtis Jenkins in Treatment: 13 Debridement Performed for Assessment: Wound #1 Left,Lateral Calcaneus Performed By: Physician Curtis Dillon., MD Debridement Type: Debridement Level of Consciousness (Pre-procedure): Awake and Alert Pre-procedure Verification/Time Out Yes - 16:40 Taken: Start Time: 16:40 T Area Debrided (L x W): otal 1.2 (cm) x 0.6 (cm) = 0.72 (cm) Tissue and other material debrided: Viable, Non-Viable, Callus, Slough, Subcutaneous, Slough Level: Skin/Subcutaneous Tissue Debridement Description: Excisional Instrument: Curette Bleeding: Minimum Hemostasis Achieved: Pressure End Time: 16:41 Procedural Pain: 0 Post Procedural Pain: 0 Response to Treatment: Procedure was tolerated well Level of Consciousness (Post- Awake and Alert procedure): Post Debridement Measurements of Total Wound Length: (cm) 1.2 Stage: Category/Stage IV Width: (cm) 0.6 Depth: (cm) 0.3 Volume: (cm) 0.17 Character of Wound/Ulcer Post Debridement: Improved Post Procedure Diagnosis Same as Pre-procedure Electronic Signature(s) Signed: 09/27/2019 5:12:45 PM By: Curtis Ham MD Signed: 09/28/2019 5:57:30 PM By: Curtis Hurst RN, BSN Entered By: Curtis Jenkins on 09/26/2019 17:56:28 -------------------------------------------------------------------------------- HPI Details Patient Name: Date of Service: Curtis Crow NA RD G. 09/26/2019 3:15 PM Medical Record Number: [id number] Patient Account Number: [id number] Date of Birth/Sex: Treating RN: 10-12-1935 (84 y.o. Curtis Jenkins Other Clinician: Referring Provider: Treating Provider/Extender: Curtis Jenkins in Treatment: 13 History of Present Illness HPI Description: ADMISSION 06/23/19 This is an 84 year old man who lives in [place]. He was admitted to hospital in March with altered functional level. 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 He came home with physical therapy at some point he was discovered that he had pressure ulcer on the left heel which started as a blister. He has had home health [[facility]} going out to change dressings. They have been using [employer]. The patient is minimally ambulatory only for transfers. They have been offloading the heel by using pillows etc. He spends most of his day in bed or in a recliner. 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 His daughter lives next door and there are other family members close by. ABI in our clinic was 0.77 on the left 06/28/2019; patient I admitted to the clinic last week who has a deep pressure ulcer on the left heel. Culture I did of this area showed Enterococcus and staph epidermidis. Empirically put him on Augmentin last week which will continue for another week. His x-ray suggested possible calcaneal erosion. He will also require an MRI. We are using silver alginate. Arterial studies are on 6/22 6/21; this is a patient with a deep pressure ulcer on the left heel. He has his arterial studies tomorrow however currently his MRI is only booked for 7/14 at [facility]. This will not suffice. He is still on Augmentin that I started him on last week. Culture of the area superficially I did showed Enterococcus. 6/28;  arterial studies got delayed until 6/30. MRI is for 7/3. He has completed Augmentin. We are using silver alginate in the wound 7/6; arterial studies actually were  surprisingly good. On the left his ABI was 1 TBI is slightly reduced at 0.5 biphasic waveforms. Unfortunately his MRI documents osteomyelitis deep to the wound. Negative for abscess or septic joint. We have been using silver alginate. 7/15; we still have not heard from infectious disease. Bone culture I did showed a few Enterococcus faecalis and ampicillin sensitive. I got a few fragments of bone that did not show definite osteomyelitis nevertheless I fairly certain that this is what the patient has. With regards to the buttock area on the left buttock this is healed over. Still some erythema in the area probably indicative of pressure, wetness friction etc. We went over offloading mechanism 8/9-Patient returns he has been on IV antibiotics for his left heel osteo and due for 6-week course in total. We are using alginate to the heel he has a very tiny area on the plantar aspect of the forefoot as a new area but this is quite small and shallow. He developed it when he was transferring for an appointment 8/23; 2-week follow-up. Area on the left heel. Hyper granulated today. Still on IV vancomycin and Rocephin week 4/6 as directed by infectious disease for underlying osteomyelitis in the left calcaneus 9/13; 2-week follow-up. Area on the left heel debris on the surface and around the wound. He has completed his antibiotics and his PICC line has been removed. There are some question whether Curtis Jenkins wanted him to take oral medications. We have been using silver alginate Electronic Signature(s) Signed: 09/27/2019 5:12:45 PM By: Curtis Ham MD Entered By: Curtis Jenkins on 09/26/2019 17:59:11 -------------------------------------------------------------------------------- Physical Exam Details Patient Name: Date of Service: Curtis Crow  NA RD G. 09/26/2019 3:15 PM Medical Record Number: [id number] Patient Account Number: [id number] Date of Birth/Sex: Treating RN: 05-01-1935 (84 y.o. Curtis Jenkins Other Clinician: Referring Provider: Treating Provider/Extender: Curtis Jenkins in Treatment: 13 Constitutional Sitting or standing Blood Pressure is within target range for patient.. Pulse regular and within target range for patient.Marland Kitchen Respirations regular, non-labored and within target range.. Temperature is normal and within the target range for the patient.Marland Kitchen Appears in no distress. Notes Wound exam; left heel wound. Debris around the surface and on the surface but measuring smaller. Using a #5 curette callus and skin from around the wound circumference and debris from the wound surface removed. Hemostasis with direct pressure Electronic Signature(s) Signed: 09/27/2019 5:12:45 PM By: Curtis Ham MD Entered By: Curtis Jenkins on 09/26/2019 17:59:57 -------------------------------------------------------------------------------- Physician Orders Details Patient Name: Date of Service: Curtis [name] NA RD G. 09/26/2019 3:15 PM Medical Record Number: [id number] Patient Account Number: [id number] Date of Birth/Sex: Treating RN: 04-Aug-1935 (84 y.o. Curtis Jenkins Primary Care Provider: Other Clinician: Redmond Jenkins Referring Provider: Treating Provider/Extender: Curtis Jenkins in Treatment: [phone] Verbal / Phone Orders: No Diagnosis Coding ICD-10 Coding Code Description [phone] Pressure ulcer of left heel, unstageable G60.3 Idiopathic progressive neuropathy M86.672 Other chronic osteomyelitis, left ankle and foot L89.322 Pressure ulcer of left buttock, stage 2 Follow-up Appointments Return Appointment in 2 weeks. Dressing Change Frequency Wound #1 Left,Lateral Calcaneus Change Dressing every other day. - home health change twice a  week, all other days family to change, may change daily if needed for excessive drainage Wound Cleansing Wound #1 Left,Lateral Calcaneus Clean wound with Wound Cleanser - or wash with soap and water Primary Wound Dressing Wound #1 Left,Lateral Calcaneus Calcium Alginate with Silver Secondary Dressing Wound #1 Left,Lateral Calcaneus Kerlix/Rolled Gauze Dry  Gauze Heel Cup Edema Control Elevate legs to the level of the heart or above for 30 minutes daily and/or when sitting, a frequency of: - throughout the day Off-Loading Turn and reposition every 2 hours Other: - float heels with bunny boots or pillows while rest in bed or chair to aid in offloading pressure to heels. [place] skilled nursing for wound care. - [facility] Electronic Signature(s) Signed: 09/27/2019 5:12:45 PM By: Curtis Ham MD Signed: 09/28/2019 5:57:30 PM By: Curtis Hurst RN, BSN Entered By: Curtis Jenkins on 09/26/2019 16:42:32 -------------------------------------------------------------------------------- Problem List Details Patient Name: Date of Service: Curtis [name] NA RD G. 09/26/2019 3:15 PM Medical Record Number: [id number] Patient Account Number: [id number] Date of Birth/Sex: Treating RN: March 09, 1935 (84 y.o. 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 09/26/2019 3:15 PM Medical Record Number: [id number] Patient Account Number: [id number] Date of Birth/Sex: Treating RN: [birth year] (84 y.o. Curtis Jenkins Other Clinician: Referring Provider: Treating Provider/Extender: Curtis Jenkins in Treatment: 13 Subjective History of Present Illness (HPI) ADMISSION 06/23/19 This is an 84 year old man who lives in [place]. He was admitted to hospital in March with altered functional level. 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 He came home with physical therapy at some point he was discovered that he had pressure ulcer on the left heel which started as a blister. He has had home health [[facility]} going out to change dressings. They have been using [employer]. The patient is minimally ambulatory only for transfers. They have been offloading the heel by using pillows etc. He spends most of his day in bed or in a recliner. 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 His daughter lives next door and there are other family members close by. ABI in our clinic was 0.77 on the left 06/28/2019; patient I admitted to the clinic last week who has a deep pressure ulcer on the left heel. Culture I did of this area showed Enterococcus and staph epidermidis.  Empirically put him on Augmentin last week which will continue for another week. His x-ray suggested possible calcaneal erosion. He will also require an MRI. We are using silver alginate. Arterial studies are on 6/22 6/21; this is a patient with a deep pressure ulcer on the left heel. He has his arterial studies tomorrow however currently his MRI is only booked for 7/14 at [facility]. This will not suffice. He is still on Augmentin that I started him on last week. Culture of the  area superficially I did showed Enterococcus. 6/28; arterial studies got delayed until 6/30. MRI is for 7/3. He has completed Augmentin. We are using silver alginate in the wound 7/6; arterial studies actually were surprisingly good. On the left his ABI was 1 TBI is slightly reduced at 0.5 biphasic waveforms. Unfortunately his MRI documents osteomyelitis deep to the wound. Negative for abscess or septic joint. We have been using silver alginate. 7/15; we still have not heard from infectious disease. Bone culture I did showed a few Enterococcus faecalis and ampicillin sensitive. I got a few fragments of bone that did not show definite osteomyelitis nevertheless I fairly certain that this is what the patient has. With regards to the buttock area on the left buttock this is healed over. Still some erythema in the area probably indicative of pressure, wetness friction etc. We went over offloading mechanism 8/9-Patient returns he has been on IV antibiotics for his left heel osteo and due for 6-week course in total. We are using alginate to the heel he has a very tiny area on the plantar aspect of the forefoot as a new area but this is quite small and shallow. He developed it when he was transferring for an appointment 8/23; 2-week follow-up. Area on the left heel. Hyper granulated today. Still on IV vancomycin and Rocephin week 4/6 as directed by infectious disease for underlying osteomyelitis in the left calcaneus 9/13; 2-week  follow-up. Area on the left heel debris on the surface and around the wound. He has completed his antibiotics and his PICC line has been removed. There are some question whether Curtis Jenkins wanted him to take oral medications. We have been using silver alginate Objective Constitutional Sitting or standing Blood Pressure is within target range for patient.. Pulse regular and within target range for patient.Marland Kitchen Respirations regular, non-labored and within target range.. Temperature is normal and within the target range for the patient.Marland Kitchen Appears in no distress. Vitals Time Taken: 4:30 PM, Height: 71 in, Weight: 250 lbs, BMI: 34.9, Temperature: 98.6 F, Pulse: 65 bpm, Respiratory Rate: 18 breaths/min, Blood Pressure: 127/72 mmHg. General Notes: Wound exam; left heel wound. Debris around the surface and on the surface but measuring smaller. Using a #5 curette callus and skin from around the wound circumference and debris from the wound surface removed. Hemostasis with direct pressure Integumentary (Hair, Skin) Wound #1 status is Open. Original cause of wound was Blister. The wound is located on the Left,Lateral Calcaneus. The wound measures 1.2cm length x 0.6cm width x 0.3cm depth; 0.565cm^2 area and 0.17cm^3 volume. There is Fat Layer (Subcutaneous Tissue) exposed. There is no tunneling or undermining noted. There is a medium amount of serosanguineous drainage noted. The wound margin is well defined and not attached to the wound base. There is medium (34- 66%) pink granulation within the wound bed. There is a medium (34-66%) amount of necrotic tissue within the wound bed including Adherent Slough. Assessment Active Problems ICD-10 Pressure ulcer of left heel, unstageable Idiopathic progressive neuropathy Other chronic osteomyelitis, left ankle and foot Pressure ulcer of left buttock, stage 2 Procedures Wound #1 Pre-procedure diagnosis of Wound #1 is a Pressure Ulcer located on the Left,Lateral  Calcaneus . There was a Excisional Skin/Subcutaneous Tissue Debridement with a total area of 0.72 sq cm performed by Curtis Dillon., MD. With the following instrument(s): Curette to remove Viable and Non-Viable tissue/material. Material removed includes Callus, Subcutaneous Tissue, and Slough. No specimens were taken. A time out was conducted at 16:40, prior to  the start of the procedure. A Minimum amount of bleeding was controlled with Pressure. The procedure was tolerated well with a pain level of 0 throughout and a pain level of 0 following the procedure. Post Debridement Measurements: 1.2cm length x 0.6cm width x 0.3cm depth; 0.17cm^3 volume. Post debridement Stage noted as Category/Stage IV. Character of Wound/Ulcer Post Debridement is improved. Post procedure Diagnosis Wound #1: Same as Pre-Procedure Plan Follow-up Appointments: Return Appointment in 2 weeks. Dressing Change Frequency: Wound #1 Left,Lateral Calcaneus: Change Dressing every other day. - home health change twice a week, all other days family to change, may change daily if needed for excessive drainage Wound Cleansing: Wound #1 Left,Lateral Calcaneus: Clean wound with Wound Cleanser - or wash with soap and water Primary Wound Dressing: Wound #1 Left,Lateral Calcaneus: Calcium Alginate with Silver Secondary Dressing: Wound #1 Left,Lateral Calcaneus: Kerlix/Rolled Gauze Dry Gauze Heel Cup Edema Control: Elevate legs to the level of the heart or above for 30 minutes daily and/or when sitting, a frequency of: - throughout the day Off-Loading: Turn and reposition every 2 hours Other: - float heels with bunny boots or pillows while rest in bed or chair to aid in offloading pressure to heels. Home Health: [facility] skilled nursing for wound care. - [facility]. I think we are making progress with the silver alginate in spite of the debridement. We therefore continued this. Heel cup and dry gauze. Rigorous  offloading as supervised by his family Electronic Signature(s) Signed: 09/27/2019 5:12:45 PM By: Curtis Ham MD Entered By: Curtis Jenkins on 09/26/2019 18:00:41 -------------------------------------------------------------------------------- SuperBill Details Patient Name: Date of Service: Curtis Crow NA RD G. 09/26/2019 Medical Record Number: [id number] Patient Account Number: [id number] Date of Birth/Sex: Treating RN: 12/07/1935 (84 y.o. Curtis Jenkins Other Clinician: Referring Provider: Treating Provider/Extender: Curtis Jenkins in Treatment: 13 Diagnosis Coding ICD-10 Codes Code Description [phone] Pressure ulcer of left heel, unstageable G60.3 Idiopathic progressive neuropathy M86.672 Other chronic osteomyelitis, left ankle and foot L89.322 Pressure ulcer of left buttock, stage 2 Facility Procedures CPT4 Code: [id number] Description: 76720 - DEB SUBQ TISSUE 20 SQ CM/< ICD-10 Diagnosis Description L89.620 Pressure ulcer of left heel, unstageable Modifier: Quantity: 1 Physician Procedures : CPT4 Code Description Modifier [id number] - WC PHYS SUBQ TISS 20 SQ CM ICD-10 Diagnosis Description L89.620 Pressure ulcer of left heel, unstageable Quantity: 1 Electronic Signature(s) Signed: 09/27/2019 5:12:45 PM By: Curtis Ham MD Entered By: Curtis Jenkins on 09/26/2019 18:00:57

## 2019-09-28 NOTE — Progress Notes (Signed)
MICHAELL, GRIDER ([id number]) Visit Report for 09/26/2019 Arrival Information Details Patient Name: Date of Service: [place] RD G. 09/26/2019 3:15 PM Medical Record Number: [id number] Patient Account Number: [id number] Date of Birth/Sex: Treating RN: July 18, 1935 (84 y.o. M) Primary Care Zacharie Portner: [employer] Other Clinician: Referring Bishoy Cupp: Treating Khalessi Blough/Extender: Garfield Cornea in Treatment: 69 Visit Information History Since Last Visit Added or deleted any medications: No Patient Arrived: Wheel Chair Any new allergies or adverse reactions: No Arrival Time: 16:30 Had a fall or experienced change in No Accompanied By: daughter activities of daily living that may affect Transfer Assistance: None risk of falls: Patient Identification Verified: Yes Signs or symptoms of abuse/neglect since last visito No Secondary Verification Process Completed: Yes Hospitalized since last visit: No Patient Requires Transmission-Based Precautions: No Implantable device outside of the clinic excluding No Patient Has Alerts: Yes cellular tissue based products placed in the center Patient Alerts: Left ABI: 0.77 since last visit: Has Dressing in Place as Prescribed: Yes Pain Present Now: No Electronic Signature(s) Signed: 09/27/2019 10:35:16 AM By: Sandre Kitty Entered By: Sandre Kitty on 09/26/2019 16:30:38 -------------------------------------------------------------------------------- Encounter Discharge Information Details Patient Name: Date of Service: [name] NA RD G. 09/26/2019 3:15 PM Medical Record Number: [id number] Patient Account Number: [id number] Date of Birth/Sex: Treating RN: 21-Nov-1935 (84 y.o. [name] Primary Care Nevae Pinnix: [employer] Other Clinician: Referring Pearson Reasons: Treating Pearline Yerby/Extender: Garfield Cornea in Treatment: 13 Encounter Discharge Information Items Post Procedure  Vitals Discharge Condition: Stable Temperature (F): 98.6 Ambulatory Status: Wheelchair Pulse (bpm): 65 Discharge Destination: Home Respiratory Rate (breaths/min): 18 Transportation: Private Auto Blood Pressure (mmHg): 127/72 Accompanied By: daughter Schedule Follow-up Appointment: Yes Clinical Summary of Care: Patient Declined Electronic Signature(s) Signed: 09/26/2019 6:04:50 PM By: Kela Millin Entered By: Kela Millin on 09/26/2019 16:46:36 -------------------------------------------------------------------------------- Lower Extremity Assessment Details Patient Name: Date of Service: [name] NA RD G. 09/26/2019 3:15 PM Medical Record Number: [id number] Patient Account Number: [id number] Date of Birth/Sex: Treating RN: Sep 14, 1935 (84 y.o. [name] Primary Care Joliene Salvador: [employer] Other Clinician: Referring Keith Cancio: Treating Gethsemane Fischler/Extender: Garfield Cornea in Treatment: 13 Edema Assessment Assessed: [name]: No] Patrice Paradise: No] Edema: [Left: Ye] [Right: s] Calf Left: Right: Point of Measurement: 39 cm From Medial Instep 34.5 cm cm Ankle Left: Right: Point of Measurement: 14 cm From Medial Instep 26 cm cm Vascular Assessment Pulses: Dorsalis Pedis Palpable: [Left:Yes] Electronic Signature(s) Signed: 09/28/2019 5:57:30 PM By: Levan Hurst RN, BSN Entered By: Levan Hurst on 09/26/2019 16:40:01 -------------------------------------------------------------------------------- Multi Wound Chart Details Patient Name: Date of Service: [name] NA RD G. 09/26/2019 3:15 PM Medical Record Number: [id number] Patient Account Number: [id number] Date of Birth/Sex: Treating RN: 1935/03/09 (84 y.o. [name] Primary Care Reinhold Rickey: [employer] Other Clinician: Referring Maizy Davanzo: Treating Jakarius Flamenco/Extender: Garfield Cornea in Treatment: 13 Vital Signs Height(in): 71 Pulse(bpm):  28 Weight(lbs): 250 Blood Pressure(mmHg): 127/72 Body Mass Index(BMI): 35 Temperature(F): 98.6 Respiratory Rate(breaths/min): 18 Photos: [1:No Photos Left, Lateral Calcaneus] [N/A:N/A N/A] Wound Location: [1:Blister] [N/A:N/A] Wounding Event: [1:Pressure Ulcer] [N/A:N/A] Primary Etiology: [1:05/12/2019] [N/A:N/A] Date Acquired: [1:13] [N/A:N/A] Weeks of Treatment: [1:Open] [N/A:N/A] Wound Status: [1:1.2x0.6x0.3] [N/A:N/A] Measurements L x W x D (cm) [1:0.565] [N/A:N/A] A (cm) : rea [1:0.17] [N/A:N/A] Volume (cm) : [1:94.10%] [N/A:N/A] % Reduction in A rea: [1:98.90%] [N/A:N/A] % Reduction in Volume: [1:Category/Stage IV] [N/A:N/A] Classification: [1:Medium] [N/A:N/A] Exudate A mount: [1:Serosanguineous] [N/A:N/A] Exudate Type: [1:red, brown] [N/A:N/A] Exudate Color: [1:Well defined, not  attached] [N/A:N/A] Wound Margin: [1:Medium (34-66%)] [N/A:N/A] Granulation A mount: [1:Pink] [N/A:N/A] Granulation Quality: [1:Medium (34-66%)] [N/A:N/A] Necrotic A mount: [1:Fat Layer (Subcutaneous Tissue): Yes N/A] Exposed Structures: [1:Fascia: No Tendon: No Muscle: No Joint: No Bone: No Small (1-33%)] [N/A:N/A] Epithelialization: [1:Debridement - Excisional] [N/A:N/A] Debridement: Pre-procedure Verification/Time Out 16:40 [N/A:N/A] Taken: [1:Callus, Subcutaneous, Slough] [N/A:N/A] Tissue Debrided: [1:Skin/Subcutaneous Tissue] [N/A:N/A] Level: [1:0.72] [N/A:N/A] Debridement A (sq cm): [1:rea Curette] [N/A:N/A] Instrument: [1:Minimum] [N/A:N/A] Bleeding: [1:Pressure] [N/A:N/A] Hemostasis A chieved: [1:0] [N/A:N/A] Procedural Pain: [1:0] [N/A:N/A] Post Procedural Pain: [1:Procedure was tolerated well] [N/A:N/A] Debridement Treatment Response: [1:1.2x0.6x0.3] [N/A:N/A] Post Debridement Measurements L x W x D (cm) [1:0.17] [N/A:N/A] Post Debridement Volume: (cm) [1:Category/Stage IV] [N/A:N/A] Post Debridement Stage: [1:Debridement] [N/A:N/A] Treatment Notes Wound #1 (Left, Lateral  Calcaneus) 1. Cleanse With Wound Cleanser 3. Primary Dressing Applied Calcium Alginate Ag 4. Secondary Dressing Dry Gauze Roll Gauze Heel Cup 5. Secured With Tape Notes Horticulturist, commercial) Signed: 09/27/2019 5:12:45 PM By: Linton Ham MD Signed: 09/28/2019 5:57:30 PM By: Levan Hurst RN, BSN Entered By: Linton Ham on 09/26/2019 17:56:18 -------------------------------------------------------------------------------- Multi-Disciplinary Care Plan Details Patient Name: Date of Service: [name] NA RD G. 09/26/2019 3:15 PM Medical Record Number: [id number] Patient Account Number: [id number] Date of Birth/Sex: Treating RN: 1935-11-07 (84 y.o. [name] Primary Care Sharvi Mooneyhan: [employer] Other Clinician: Referring Jaryan Chicoine: Treating Yoshino Broccoli/Extender: Garfield Cornea in Treatment: 13 Active Inactive Wound/Skin Impairment Nursing Diagnoses: Knowledge deficit related to ulceration/compromised skin integrity Goals: Patient/caregiver will verbalize understanding of skin care regimen Date Initiated: 06/23/2019 Target Resolution Date: 10/07/2019 Goal Status: Active Interventions: Assess patient/caregiver ability to obtain necessary supplies Assess patient/caregiver ability to perform ulcer/skin care regimen upon admission and as needed Provide education on ulcer and skin care Treatment Activities: Skin care regimen initiated : 06/23/2019 Topical wound management initiated : 06/23/2019 Notes: Electronic Signature(s) Signed: 09/28/2019 5:57:30 PM By: Levan Hurst RN, BSN Entered By: Levan Hurst on 09/26/2019 17:21:28 -------------------------------------------------------------------------------- Pain Assessment Details Patient Name: Date of Service: [name] NA RD G. 09/26/2019 3:15 PM Medical Record Number: [id number] Patient Account Number: [id number] Date of Birth/Sex: Treating RN: October 06, 1935 (84 y.o. M) Primary  Care Layana Konkel: [employer] Other Clinician: Referring Favor Hackler: Treating Khadeeja Elden/Extender: Garfield Cornea in Treatment: 13 Active Problems Location of Pain Severity and Description of Pain Patient Has Paino No Site Locations Pain Management and Medication Current Pain Management: Electronic Signature(s) Signed: 09/27/2019 10:35:16 AM By: Sandre Kitty Entered By: Sandre Kitty on 09/26/2019 16:31:03 -------------------------------------------------------------------------------- Patient/Caregiver Education Details Patient Name: Date of Service: [name] NA RD G. 9/13/2021andnbsp3:15 PM Medical Record Number: [id number] Patient Account Number: [id number] Date of Birth/Gender: Treating RN: May 13, 1935 (84 y.o. [name] Primary Care Physician: [employer] Other Clinician: Referring Physician: Treating Physician/Extender: Garfield Cornea in Treatment: 13 Education Assessment Education Provided To: Patient Education Topics Provided Wound/Skin Impairment: Methods: Explain/Verbal Responses: State content correctly [employer]) Signed: 09/28/2019 5:57:30 PM By: Levan Hurst RN, BSN Entered By: Levan Hurst on 09/26/2019 17:21:38 -------------------------------------------------------------------------------- Wound Assessment Details Patient Name: Date of Service: [name] NA RD G. 09/26/2019 3:15 PM Medical Record Number: [id number] Patient Account Number: [id number] Date of Birth/Sex: Treating RN: 1935-08-10 (84 y.o. M) Primary Care Oluwadarasimi Favor: [employer] Other Clinician: Referring Fiorella Hanahan: Treating Tyanna Hach/Extender: Garfield Cornea in Treatment: 13 Wound Status Wound Number: 1 Primary Etiology: Pressure Ulcer Wound Location: Left, Lateral Calcaneus Wound Status: Open Wounding Event: Blister Date Acquired: 05/12/2019 Weeks Of Treatment: 13 Clustered  Wound: No Wound Measurements Length: (cm) 1.2 Width: (cm) 0.6 Depth: (cm) 0.3 Area: (cm) 0.565 Volume: (cm) 0.17 % Reduction in Area: 94.1% % Reduction in Volume: 98.9% Epithelialization: Small (1-33%) Tunneling: No Undermining: No Wound Description Classification: Category/Stage IV Wound Margin: Well defined, not attached Exudate Amount: Medium Exudate Type: Serosanguineous Exudate Color: red, brown Wound Bed Granulation Amount: Medium (34-66%) Granulation Quality: Pink Necrotic Amount: Medium (34-66%) Necrotic Quality: Adherent Slough Foul Odor After Cleansing: No Slough/Fibrino Yes Exposed Structure Fascia Exposed: No Fat Layer (Subcutaneous Tissue) Exposed: Yes Tendon Exposed: No Muscle Exposed: No Joint Exposed: No Bone Exposed: No Treatment Notes Wound #1 (Left, Lateral Calcaneus) 1. Cleanse With Wound Cleanser 3. Primary Dressing Applied Calcium Alginate Ag 4. Secondary Dressing Dry Gauze Roll Gauze Heel Cup 5. Secured With Tape Notes Horticulturist, commercial) Signed: 09/28/2019 5:57:30 PM By: Levan Hurst RN, BSN Entered By: Levan Hurst on 09/26/2019 16:40:16 -------------------------------------------------------------------------------- Vitals Details Patient Name: Date of Service: [name] NA RD G. 09/26/2019 3:15 PM Medical Record Number: [id number] Patient Account Number: [id number] Date of Birth/Sex: Treating RN: 08-07-1935 (84 y.o. M) Primary Care Jose Alleyne: [employer] Other Clinician: Referring Analisse Randle: Treating Jonhatan Hearty/Extender: Garfield Cornea in Treatment: 13 Vital Signs Time Taken: 16:30 Temperature (F): 98.6 Height (in): 71 Pulse (bpm): 65 Weight (lbs): 250 Respiratory Rate (breaths/min): 18 Body Mass Index (BMI): 34.9 Blood Pressure (mmHg): 127/72 Reference Range: 80 - 120 mg / dl Electronic Signature(s) Signed: 09/27/2019 10:35:16 AM By: Sandre Kitty Entered By: Sandre Kitty on 09/26/2019 16:30:55

## 2019-09-28 NOTE — Progress Notes (Signed)
[facility] Summary Report / Loop Recorder 

## 2019-09-29 DIAGNOSIS — E785 Hyperlipidemia, unspecified: Secondary | ICD-10-CM | POA: Diagnosis not present

## 2019-09-29 DIAGNOSIS — L89620 Pressure ulcer of left heel, unstageable: Secondary | ICD-10-CM | POA: Diagnosis not present

## 2019-09-29 DIAGNOSIS — M159 Polyosteoarthritis, unspecified: Secondary | ICD-10-CM | POA: Diagnosis not present

## 2019-09-29 DIAGNOSIS — M25422 Effusion, left elbow: Secondary | ICD-10-CM | POA: Diagnosis not present

## 2019-09-29 DIAGNOSIS — R1310 Dysphagia, unspecified: Secondary | ICD-10-CM | POA: Diagnosis not present

## 2019-09-29 DIAGNOSIS — I129 Hypertensive chronic kidney disease with stage 1 through stage 4 chronic kidney disease, or unspecified chronic kidney disease: Secondary | ICD-10-CM | POA: Diagnosis not present

## 2019-09-29 DIAGNOSIS — I69391 Dysphagia following cerebral infarction: Secondary | ICD-10-CM | POA: Diagnosis not present

## 2019-09-29 DIAGNOSIS — N182 Chronic kidney disease, stage 2 (mild): Secondary | ICD-10-CM | POA: Diagnosis not present

## 2019-09-29 DIAGNOSIS — I69954 Hemiplegia and hemiparesis following unspecified cerebrovascular disease affecting left non-dominant side: Secondary | ICD-10-CM | POA: Diagnosis not present

## 2019-09-29 DIAGNOSIS — Z720 Tobacco use: Secondary | ICD-10-CM | POA: Diagnosis not present

## 2019-10-03 DIAGNOSIS — M159 Polyosteoarthritis, unspecified: Secondary | ICD-10-CM | POA: Diagnosis not present

## 2019-10-03 DIAGNOSIS — Z720 Tobacco use: Secondary | ICD-10-CM | POA: Diagnosis not present

## 2019-10-03 DIAGNOSIS — I69391 Dysphagia following cerebral infarction: Secondary | ICD-10-CM | POA: Diagnosis not present

## 2019-10-03 DIAGNOSIS — E785 Hyperlipidemia, unspecified: Secondary | ICD-10-CM | POA: Diagnosis not present

## 2019-10-03 DIAGNOSIS — L89620 Pressure ulcer of left heel, unstageable: Secondary | ICD-10-CM | POA: Diagnosis not present

## 2019-10-03 DIAGNOSIS — N182 Chronic kidney disease, stage 2 (mild): Secondary | ICD-10-CM | POA: Diagnosis not present

## 2019-10-03 DIAGNOSIS — I129 Hypertensive chronic kidney disease with stage 1 through stage 4 chronic kidney disease, or unspecified chronic kidney disease: Secondary | ICD-10-CM | POA: Diagnosis not present

## 2019-10-03 DIAGNOSIS — M25422 Effusion, left elbow: Secondary | ICD-10-CM | POA: Diagnosis not present

## 2019-10-03 DIAGNOSIS — R1310 Dysphagia, unspecified: Secondary | ICD-10-CM | POA: Diagnosis not present

## 2019-10-03 DIAGNOSIS — I69954 Hemiplegia and hemiparesis following unspecified cerebrovascular disease affecting left non-dominant side: Secondary | ICD-10-CM | POA: Diagnosis not present

## 2019-10-07 DIAGNOSIS — E785 Hyperlipidemia, unspecified: Secondary | ICD-10-CM | POA: Diagnosis not present

## 2019-10-07 DIAGNOSIS — I69391 Dysphagia following cerebral infarction: Secondary | ICD-10-CM | POA: Diagnosis not present

## 2019-10-07 DIAGNOSIS — Z720 Tobacco use: Secondary | ICD-10-CM | POA: Diagnosis not present

## 2019-10-07 DIAGNOSIS — M159 Polyosteoarthritis, unspecified: Secondary | ICD-10-CM | POA: Diagnosis not present

## 2019-10-07 DIAGNOSIS — L89620 Pressure ulcer of left heel, unstageable: Secondary | ICD-10-CM | POA: Diagnosis not present

## 2019-10-07 DIAGNOSIS — I69954 Hemiplegia and hemiparesis following unspecified cerebrovascular disease affecting left non-dominant side: Secondary | ICD-10-CM | POA: Diagnosis not present

## 2019-10-07 DIAGNOSIS — M25422 Effusion, left elbow: Secondary | ICD-10-CM | POA: Diagnosis not present

## 2019-10-07 DIAGNOSIS — R1310 Dysphagia, unspecified: Secondary | ICD-10-CM | POA: Diagnosis not present

## 2019-10-07 DIAGNOSIS — I129 Hypertensive chronic kidney disease with stage 1 through stage 4 chronic kidney disease, or unspecified chronic kidney disease: Secondary | ICD-10-CM | POA: Diagnosis not present

## 2019-10-07 DIAGNOSIS — N182 Chronic kidney disease, stage 2 (mild): Secondary | ICD-10-CM | POA: Diagnosis not present

## 2019-10-10 ENCOUNTER — Encounter (HOSPITAL_BASED_OUTPATIENT_CLINIC_OR_DEPARTMENT_OTHER): Payer: Medicare Other | Admitting: Internal Medicine

## 2019-10-11 DIAGNOSIS — L8990 Pressure ulcer of unspecified site, unspecified stage: Secondary | ICD-10-CM | POA: Diagnosis not present

## 2019-10-11 DIAGNOSIS — R1310 Dysphagia, unspecified: Secondary | ICD-10-CM | POA: Diagnosis not present

## 2019-10-11 DIAGNOSIS — I639 Cerebral infarction, unspecified: Secondary | ICD-10-CM | POA: Diagnosis not present

## 2019-10-13 DIAGNOSIS — I129 Hypertensive chronic kidney disease with stage 1 through stage 4 chronic kidney disease, or unspecified chronic kidney disease: Secondary | ICD-10-CM | POA: Diagnosis not present

## 2019-10-13 DIAGNOSIS — E1122 Type 2 diabetes mellitus with diabetic chronic kidney disease: Secondary | ICD-10-CM | POA: Diagnosis not present

## 2019-10-13 DIAGNOSIS — L89620 Pressure ulcer of left heel, unstageable: Secondary | ICD-10-CM | POA: Diagnosis not present

## 2019-10-13 DIAGNOSIS — I639 Cerebral infarction, unspecified: Secondary | ICD-10-CM | POA: Diagnosis not present

## 2019-10-13 DIAGNOSIS — I69391 Dysphagia following cerebral infarction: Secondary | ICD-10-CM | POA: Diagnosis not present

## 2019-10-13 DIAGNOSIS — I69954 Hemiplegia and hemiparesis following unspecified cerebrovascular disease affecting left non-dominant side: Secondary | ICD-10-CM | POA: Diagnosis not present

## 2019-10-13 DIAGNOSIS — N182 Chronic kidney disease, stage 2 (mild): Secondary | ICD-10-CM | POA: Diagnosis not present

## 2019-10-13 DIAGNOSIS — L8990 Pressure ulcer of unspecified site, unspecified stage: Secondary | ICD-10-CM | POA: Diagnosis not present

## 2019-10-13 DIAGNOSIS — E785 Hyperlipidemia, unspecified: Secondary | ICD-10-CM | POA: Diagnosis not present

## 2019-10-13 DIAGNOSIS — M159 Polyosteoarthritis, unspecified: Secondary | ICD-10-CM | POA: Diagnosis not present

## 2019-10-13 DIAGNOSIS — Z720 Tobacco use: Secondary | ICD-10-CM | POA: Diagnosis not present

## 2019-10-13 DIAGNOSIS — N184 Chronic kidney disease, stage 4 (severe): Secondary | ICD-10-CM | POA: Diagnosis not present

## 2019-10-13 DIAGNOSIS — R1310 Dysphagia, unspecified: Secondary | ICD-10-CM | POA: Diagnosis not present

## 2019-10-13 DIAGNOSIS — M25422 Effusion, left elbow: Secondary | ICD-10-CM | POA: Diagnosis not present

## 2019-10-17 ENCOUNTER — Other Ambulatory Visit: Payer: Self-pay

## 2019-10-17 ENCOUNTER — Encounter (HOSPITAL_BASED_OUTPATIENT_CLINIC_OR_DEPARTMENT_OTHER): Payer: Medicare Other | Attending: Internal Medicine | Admitting: Internal Medicine

## 2019-10-17 DIAGNOSIS — G603 Idiopathic progressive neuropathy: Secondary | ICD-10-CM | POA: Diagnosis not present

## 2019-10-17 DIAGNOSIS — L89620 Pressure ulcer of left heel, unstageable: Secondary | ICD-10-CM | POA: Diagnosis not present

## 2019-10-17 DIAGNOSIS — L97422 Non-pressure chronic ulcer of left heel and midfoot with fat layer exposed: Secondary | ICD-10-CM | POA: Diagnosis not present

## 2019-10-17 DIAGNOSIS — I10 Essential (primary) hypertension: Secondary | ICD-10-CM | POA: Diagnosis not present

## 2019-10-17 DIAGNOSIS — Z923 Personal history of irradiation: Secondary | ICD-10-CM | POA: Insufficient documentation

## 2019-10-17 DIAGNOSIS — M86672 Other chronic osteomyelitis, left ankle and foot: Secondary | ICD-10-CM | POA: Insufficient documentation

## 2019-10-17 DIAGNOSIS — L89322 Pressure ulcer of left buttock, stage 2: Secondary | ICD-10-CM | POA: Insufficient documentation

## 2019-10-17 DIAGNOSIS — Z7901 Long term (current) use of anticoagulants: Secondary | ICD-10-CM | POA: Insufficient documentation

## 2019-10-17 DIAGNOSIS — Z8546 Personal history of malignant neoplasm of prostate: Secondary | ICD-10-CM | POA: Diagnosis not present

## 2019-10-17 NOTE — Progress Notes (Signed)
IBAN, UTZ ([id number]) Visit Report for 10/17/2019 HPI Details Patient Name: Date of Service: [name] 10/17/2019 12:30 PM Medical Record Number: [id number] Patient Account Number: [id number] Date of Birth/Sex: Treating RN: 1935/01/19 (84 y.o. [name] Primary Care Provider: [employer] Other Clinician: Referring Provider: Treating Provider/Extender: Garfield Cornea in Treatment: 16 History of Present Illness HPI Description: ADMISSION 06/23/19 This is an 84 year old man who lives in [place]. He was admitted to hospital in March with altered functional level. 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 He came home with physical therapy at some point he was discovered that he had pressure ulcer on the left heel which started as a blister. He has had home health [[facility]} going out to change dressings. They have been using [employer]. The patient is minimally ambulatory only for transfers. They have been offloading the heel by using pillows etc. He spends most of his day in bed or in a recliner. 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 His daughter lives next door and there are other family members close by. ABI in our clinic was 0.77 on the left 06/28/2019; patient I admitted to the clinic last week who has a deep pressure ulcer on the left heel. Culture I did of this area showed Enterococcus and staph epidermidis. Empirically put him on Augmentin last week which will continue for another week. His x-ray suggested possible calcaneal erosion. He will also require an MRI. We are using silver alginate. Arterial studies are on 6/22 6/21; this is a patient with a deep pressure  ulcer on the left heel. He has his arterial studies tomorrow however currently his MRI is only booked for 7/14 at [facility]. This will not suffice. He is still on Augmentin that I started him on last week. Culture of the area superficially I did showed Enterococcus. 6/28; arterial studies got delayed until 6/30. MRI is for 7/3. He has completed Augmentin. We are using silver alginate in the wound 7/6; arterial studies actually were surprisingly good. On the left his ABI was 1 TBI is slightly reduced at 0.5 biphasic waveforms. Unfortunately his MRI documents osteomyelitis deep to the wound. Negative for abscess or septic joint. We have been using silver alginate. 7/15; we still have not heard from infectious disease. Bone culture I did showed a few Enterococcus faecalis and ampicillin sensitive. I got a few fragments of bone that did not show definite osteomyelitis nevertheless I fairly certain that this is what the patient has. With regards to the buttock area on the left buttock this is healed over. Still some erythema in the area probably indicative of pressure, wetness friction etc. We went over offloading mechanism 8/9-Patient returns he has been on IV antibiotics for his left heel osteo and due for 6-week course in total. We are using alginate to the heel he has a very tiny area on the plantar aspect of the forefoot as a new area but this is quite small and shallow. He developed it when he was transferring for an appointment 8/23; 2-week follow-up. Area on the left heel. Hyper granulated today. Still on IV vancomycin and Rocephin week 4/6 as directed by infectious disease for underlying osteomyelitis in the left calcaneus 9/13; 2-week follow-up. Area on the left  heel debris on the surface and around the wound. He has completed his antibiotics and his PICC line has been removed. There are some question whether Dr. Baxter Flattery wanted him to take oral medications. We have been using silver  alginate 10/4; 3-week follow-up. The area on the left heel lateral aspect is closed. He did complete his IV antibiotics I went back and looked at Dr. Crissie Figures last note from 9/9 she was in the planning to put him on Augmentin and doxycycline for a month although this never did get called into her their pharmacy. They have sent Dr. Graylon Good a message on my chart Electronic Signature(s) Signed: 10/17/2019 4:54:01 PM By: Linton Ham MD Entered By: Linton Ham on 10/17/2019 14:40:50 -------------------------------------------------------------------------------- Physical Exam Details Patient Name: Date of Service: [name] NA RD G. 10/17/2019 12:30 PM Medical Record Number: [id number] Patient Account Number: [id number] Date of Birth/Sex: Treating RN: 18-Mar-1935 (84 y.o. [name] Primary Care Provider: [employer] Other Clinician: Referring Provider: Treating Provider/Extender: Garfield Cornea in Treatment: 82 Constitutional Patient is hypertensive.. Pulse regular and within target range for patient.Marland Kitchen Respirations regular, non-labored and within target range.. Temperature is normal and within the target range for the patient.Marland Kitchen Appears in no distress. Notes Wound exam; left heel wound. There is debris on the surface of this removing this shows no open wound. There is not much left of the heel fat pad in this area is simply skin over the bone. We talked about this vis--vis pressure relief going forward Electronic Signature(s) Signed: 10/17/2019 4:54:01 PM By: Linton Ham MD Entered By: Linton Ham on 10/17/2019 14:41:32 -------------------------------------------------------------------------------- Physician Orders Details Patient Name: Date of Service: [name] NA RD G. 10/17/2019 12:30 PM Medical Record Number: [id number] Patient Account Number: [id number] Date of Birth/Sex: Treating RN: [birth year] (84 y.o. [name] Primary Care  Provider: [employer] Other Clinician: Referring Provider: Treating Provider/Extender: Garfield Cornea in Treatment: [phone] Verbal / Phone Orders: No Diagnosis Coding ICD-10 Coding Code Description [phone] Pressure ulcer of left heel, unstageable G60.3 Idiopathic progressive neuropathy M86.672 Other chronic osteomyelitis, left ankle and foot L89.322 Pressure ulcer of left buttock, stage 2 Discharge From [facility] Services Discharge from [place] Skin Barriers/Peri-Wound Care Moisturizing lotion - to both feet daily Wound Cleansing May shower and wash wound with soap and water. Primary Wound Dressing Foam - heel cup as needed to protect Edema Control Elevate legs to the level of the heart or above for 30 minutes daily and/or when sitting, a frequency of: - throughout the day Off-Loading Turn and reposition every 2 hours Other: - float heels with bunny boots or pillows while rest in bed or chair to aid in offloading pressure to heels. [place] [facility] - [facility] discontinue wound care Electronic Signature(s) Signed: 10/17/2019 4:54:01 PM By: Linton Ham MD Signed: 10/17/2019 5:03:51 PM By: Baruch Gouty RN, BSN Entered By: Baruch Gouty on 10/17/2019 13:31:15 -------------------------------------------------------------------------------- Problem List Details Patient Name: Date of Service: [name] NA RD G. 10/17/2019 12:30 PM Medical Record Number: [id number] Patient Account Number: [id number] Date of Birth/Sex: Treating RN: 1935-03-20 (84 y.o. [name] Primary Care Provider: [employer] Other Clinician: Referring Provider: Treating Provider/Extender: Garfield Cornea in Treatment: 16 Active Problems ICD-10 Encounter Code Description Active Date MDM Diagnosis L89.620 Pressure ulcer of left heel, unstageable 06/23/2019 No Yes G60.3 Idiopathic progressive neuropathy 06/23/2019 No  Yes M86.672 Other chronic osteomyelitis, left ankle and foot 07/19/2019 No  Yes L89.322 Pressure ulcer of left buttock, stage 2 07/19/2019 No Yes Inactive Problems ICD-10 Code Description Active Date Inactive Date L03.116 Cellulitis of left lower limb 06/23/2019 06/23/2019 I70.244 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 10/17/2019 12:30 PM Medical Record Number: [id number] Patient Account Number: [id number] Date of Birth/Sex: Treating RN: 10-Apr-1935 (84 y.o. [name] Primary Care Provider: [employer] Other Clinician: Referring Provider: Treating Provider/Extender: Garfield Cornea in Treatment: 16 Subjective History of Present Illness (HPI) ADMISSION 06/23/19 This is an 84 year old man who lives in [place]. He was admitted to hospital in March with altered functional level. 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 He came home with physical therapy at some point he was discovered that he had pressure ulcer on the left heel which started as a blister. He has had home health [[facility]} going out to change dressings. They have been using [employer]. The patient is minimally ambulatory only for transfers. They have been offloading the heel by using pillows etc. He spends most of his day in bed or in a recliner. 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 His daughter lives next door and there are other family members close by. ABI in our clinic was 0.77 on the left 06/28/2019; patient I admitted to the clinic last week who has a deep pressure ulcer on the left heel. Culture I did of this area showed Enterococcus and staph epidermidis. Empirically put him on Augmentin last week which will continue for another week. His x-ray suggested possible calcaneal erosion. He will also require an MRI. We are using silver alginate. Arterial studies are on 6/22 6/21; this is a patient with a deep pressure ulcer on the left heel. He has his arterial studies tomorrow however currently his MRI is only booked for 7/14 at [facility]. This will not suffice. He is still on Augmentin that I started him on last week. Culture of the area superficially I did showed Enterococcus. 6/28; arterial studies got delayed until 6/30. MRI is for 7/3. He has completed Augmentin. We are using silver alginate in the wound 7/6; arterial studies actually were surprisingly good. On the left his ABI was 1 TBI is slightly reduced at 0.5 biphasic waveforms. Unfortunately his MRI documents osteomyelitis deep to the wound. Negative for abscess or septic joint. We have been using silver alginate. 7/15; we still have not heard from infectious disease. Bone culture I did showed a few Enterococcus faecalis and ampicillin sensitive. I got a few fragments of bone that did not show definite osteomyelitis nevertheless I fairly certain that this is what the patient has. With regards to the buttock area on the left buttock this is healed over. Still some erythema in the area probably indicative of pressure, wetness friction etc. We went over offloading mechanism 8/9-Patient returns he has been on IV antibiotics for his left heel osteo and due for 6-week course in total. We are using alginate to the heel he has a very tiny area on the plantar aspect of the forefoot as  a new area but  this is quite small and shallow. He developed it when he was transferring for an appointment 8/23; 2-week follow-up. Area on the left heel. Hyper granulated today. Still on IV vancomycin and Rocephin week 4/6 as directed by infectious disease for underlying osteomyelitis in the left calcaneus 9/13; 2-week follow-up. Area on the left heel debris on the surface and around the wound. He has completed his antibiotics and his PICC line has been removed. There are some question whether Dr. Baxter Flattery wanted him to take oral medications. We have been using silver alginate 10/4; 3-week follow-up. The area on the left heel lateral aspect is closed. He did complete his IV antibiotics I went back and looked at Dr. Crissie Figures last note from 9/9 she was in the planning to put him on Augmentin and doxycycline for a month although this never did get called into her their pharmacy. They have sent Dr. Graylon Good a message on my chart Objective Constitutional Patient is hypertensive.. Pulse regular and within target range for patient.Marland Kitchen Respirations regular, non-labored and within target range.. Temperature is normal and within the target range for the patient.Marland Kitchen Appears in no distress. Vitals Time Taken: 12:45 PM, Height: 71 in, Weight: 250 lbs, BMI: 34.9, Temperature: 97.7 F, Pulse: 70 bpm, Respiratory Rate: 18 breaths/min, Blood Pressure: 147/74 mmHg. General Notes: Wound exam; left heel wound. There is debris on the surface of this removing this shows no open wound. There is not much left of the heel fat pad in this area is simply skin over the bone. We talked about this vis--vis pressure relief going forward Integumentary (Hair, Skin) Wound #1 status is Healed - Epithelialized. Original cause of wound was Blister. The wound is located on the Left,Lateral Calcaneus. The wound measures 0cm length x 0cm width x 0cm depth; 0cm^2 area and 0cm^3 volume. There is Fat Layer (Subcutaneous Tissue) exposed. There is no tunneling  or undermining noted. There is a none present amount of drainage noted. The wound margin is well defined and not attached to the wound base. There is no granulation within the wound bed. There is a large (67-100%) amount of necrotic tissue within the wound bed including Eschar. General Notes: dry black eschar noted to wound bed. Assessment Active Problems ICD-10 Pressure ulcer of left heel, unstageable Idiopathic progressive neuropathy Other chronic osteomyelitis, left ankle and foot Pressure ulcer of left buttock, stage 2 Plan Discharge From [facility] Services: Discharge from [place] Skin Barriers/Peri-Wound Care: Moisturizing lotion - to both feet daily Wound Cleansing: May shower and wash wound with soap and water. Primary Wound Dressing: Foam - heel cup as needed to protect Edema Control: Elevate legs to the level of the heart or above for 30 minutes daily and/or when sitting, a frequency of: - throughout the day Off-Loading: Turn and reposition every 2 hours Other: - float heels with bunny boots or pillows while rest in bed or chair to aid in offloading pressure to heels. Home Health: Discontinue home health - [facility] discontinue wound care 1. The patient can be discharged. 2. I suggested border foam over the area to protect this all heel offloading measures including there soft boot will need to continue. Electronic Signature(s) Signed: 10/17/2019 4:54:01 PM By: Linton Ham MD Entered By: Linton Ham on 10/17/2019 14:42:17 -------------------------------------------------------------------------------- SuperBill Details Patient Name: Date of Service: [name] NA RD G. 10/17/2019 Medical Record Number: [id number] Patient Account Number: [id number] Date of Birth/Sex: Treating RN: 11/30/1935 (84 y.o. [name] Primary  Care Provider: [employer] Other Clinician: Referring Provider: Treating Provider/Extender: Garfield Cornea  in Treatment: 16 Diagnosis Coding ICD-10 Codes Code Description [phone] Pressure ulcer of left heel, unstageable G60.3 Idiopathic progressive neuropathy M86.672 Other chronic osteomyelitis, left ankle and foot L89.322 Pressure ulcer of left buttock, stage 2 Facility Procedures CPT4 Code: [id number] Description: 52479 - WOUND CARE VISIT-LEV 3 EST PT Modifier: Quantity: 1 Physician Procedures : CPT4 Code Description Modifier [id number] - WC PHYS LEVEL 2 - EST PT ICD-10 Diagnosis Description L89.620 Pressure ulcer of left heel, unstageable G60.3 Idiopathic progressive neuropathy Quantity: 1 Electronic Signature(s) Signed: 10/17/2019 4:54:01 PM By: Linton Ham MD Entered By: Linton Ham on 10/17/2019 14:42:47

## 2019-10-17 NOTE — Progress Notes (Signed)
EDGEL, DEGNAN ([id number]) Visit Report for 10/17/2019 Arrival Information Details Patient Name: Date of Service: Curtis Jenkins [place] RD G. 10/17/2019 12:30 PM Medical Record Number: [id number] Patient Account Number: [id number] Date of Birth/Sex: Treating RN: 1936-01-13 (84 y.o. Curtis Jenkins, Curtis Jenkins Primary Care Curtis Jenkins: Curtis Jenkins Curtis Jenkins: Referring Curtis Jenkins: Treating Curtis Jenkins/Extender: Curtis Jenkins in Treatment: 16 Visit Information History Since Last Visit Added or deleted any medications: No Patient Arrived: Wheel Chair Any new allergies or adverse reactions: No Arrival Time: 12:44 Had a fall or experienced change in No Accompanied By: daughter activities of daily living that may affect Transfer Assistance: None risk of falls: Patient Identification Verified: Yes Signs or symptoms of abuse/neglect since last visito No Secondary Verification Process Completed: Yes Hospitalized since last visit: No Patient Requires Transmission-Based Precautions: No Implantable device outside of the clinic excluding No Patient Has Alerts: Yes cellular tissue based products placed in the center Patient Alerts: Left ABI: 0.77 since last visit: Has Dressing in Place as Prescribed: Yes Has Footwear/Offloading in Place as Prescribed: Yes Left: Multipodus Split/Boot Pain Present Now: No Electronic Signature(s) Signed: 10/17/2019 4:56:48 PM By: Curtis Jenkins Entered By: Curtis Jenkins on 10/17/2019 12:56:06 -------------------------------------------------------------------------------- Clinic Level of Care Assessment Details Patient Name: Date of Service: Curtis Jenkins [place] RD G. 10/17/2019 12:30 PM Medical Record Number: [id number] Patient Account Number: [id number] Date of Birth/Sex: Treating RN: 05-Jan-1936 (84 y.o. Curtis Jenkins Primary Care Hawthorne Day: Curtis Jenkins Curtis Jenkins: Referring Curtis Jenkins: Treating Curtis Jenkins/Extender: Curtis Jenkins in Treatment: 16 Clinic Level of Care Assessment Items TOOL 4 Quantity Score []  - 0 Use when only an EandM is performed on FOLLOW-UP visit ASSESSMENTS - Nursing Assessment / Reassessment X- 1 10 Reassessment of Co-morbidities (includes updates in patient status) X- 1 5 Reassessment of Adherence to Treatment Plan ASSESSMENTS - Wound and Skin A ssessment / Reassessment X - Simple Wound Assessment / Reassessment - one wound 1 5 []  - 0 Complex Wound Assessment / Reassessment - multiple wounds []  - 0 Dermatologic / Skin Assessment (not related to wound area) ASSESSMENTS - Focused Assessment []  - 0 Circumferential Edema Measurements - multi extremities []  - 0 Nutritional Assessment / Counseling / Intervention X- 1 5 Lower Extremity Assessment (monofilament, tuning fork, pulses) []  - 0 Peripheral Arterial Disease Assessment (using hand held doppler) ASSESSMENTS - Ostomy and/or Continence Assessment and Care []  - 0 Incontinence Assessment and Management []  - 0 Ostomy Care Assessment and Management (repouching, etc.) PROCESS - Coordination of Care X - Simple Patient / Family Education for ongoing care 1 15 []  - 0 Complex (extensive) Patient / Family Education for ongoing care X- 1 10 Staff obtains Programmer, systems, Records, T Results / Process Orders est X- 1 10 Staff telephones HHA, Nursing Homes / Clarify orders / etc []  - 0 Routine Transfer to another Facility (non-emergent condition) []  - 0 Routine Hospital Admission (non-emergent condition) []  - 0 New Admissions / Biomedical engineer / Ordering NPWT Apligraf, etc. , []  - 0 Emergency Hospital Admission (emergent condition) X- 1 10 Simple Discharge Coordination []  - 0 Complex (extensive) Discharge Coordination PROCESS - Special Needs []  - 0 Pediatric / Minor Patient Management []  - 0 Isolation Patient Management []  - 0 Hearing / Language / Visual special needs []  - 0 Assessment of Community  assistance (transportation, D/C planning, etc.) []  - 0 Additional assistance / Altered mentation []  - 0 Support Surface(s) Assessment (bed, cushion, seat, etc.) INTERVENTIONS - Wound Cleansing / Measurement X -  Simple Wound Cleansing - one wound 1 5 []  - 0 Complex Wound Cleansing - multiple wounds X- 1 5 Wound Imaging (photographs - any number of wounds) []  - 0 Wound Tracing (instead of photographs) X- 1 5 Simple Wound Measurement - one wound []  - 0 Complex Wound Measurement - multiple wounds INTERVENTIONS - Wound Dressings X - Small Wound Dressing one or multiple wounds 1 10 []  - 0 Medium Wound Dressing one or multiple wounds []  - 0 Large Wound Dressing one or multiple wounds []  - 0 Application of Medications - topical []  - 0 Application of Medications - injection INTERVENTIONS - Miscellaneous []  - 0 External ear exam []  - 0 Specimen Collection (cultures, biopsies, blood, body fluids, etc.) []  - 0 Specimen(s) / Culture(s) sent or taken to Lab for analysis []  - 0 Patient Transfer (multiple staff / Civil Service fast streamer / Similar devices) []  - 0 Simple Staple / Suture removal (25 or less) []  - 0 Complex Staple / Suture removal (26 or more) []  - 0 Hypo / Hyperglycemic Management (close monitor of Blood Glucose) []  - 0 Ankle / Brachial Index (ABI) - do not check if billed separately X- 1 5 Vital Signs Has the patient been seen at the hospital within the last three years: Yes Total Score: 100 Level Of Care: New/Established - Level 3 Electronic Signature(s) Signed: 10/17/2019 5:03:51 PM By: Curtis Gouty RN, BSN Entered By: Curtis Jenkins on 10/17/2019 13:26:58 -------------------------------------------------------------------------------- Encounter Discharge Information Details Patient Name: Date of Service: Curtis Jenkins NA RD G. 10/17/2019 12:30 PM Medical Record Number: [id number] Patient Account Number: [id number] Date of Birth/Sex: Treating RN: Jan 08, 1936 (84 y.o. Curtis Jenkins Primary Care Curtis Jenkins: Curtis Jenkins Curtis Jenkins: Referring Curtis Jenkins: Treating Curtis Jenkins/Extender: Curtis Jenkins in Treatment: 16 Encounter Discharge Information Items Discharge Condition: Stable Ambulatory Status: Wheelchair Discharge Destination: Home Transportation: Private Auto Accompanied By: Charolett Bumpers Schedule Follow-up Appointment: Yes Clinical Summary of Care: Patient Declined Electronic Signature(s) Signed: 10/17/2019 5:03:51 PM By: Curtis Gouty RN, BSN Entered By: Curtis Jenkins on 10/17/2019 13:28:17 -------------------------------------------------------------------------------- Lower Extremity Assessment Details Patient Name: Date of Service: Curtis Jenkins NA RD G. 10/17/2019 12:30 PM Medical Record Number: [id number] Patient Account Number: [id number] Date of Birth/Sex: Treating RN: 07/04/1935 (84 y.o. [name] Primary Care Keaundre Thelin: Curtis Jenkins Curtis Jenkins: Referring Thirza Pellicano: Treating Nuno Brubacher/Extender: Curtis Jenkins in Treatment: 16 Edema Assessment Assessed: [name]: Yes] Patrice Paradise: No] Edema: [Left: Ye] [Right: s] Calf Left: Right: Point of Measurement: 39 cm From Medial Instep 32 cm Ankle Left: Right: Point of Measurement: 14 cm From Medial Instep 23 cm Vascular Assessment Pulses: Dorsalis Pedis Palpable: [Left:Yes] Electronic Signature(s) Signed: 10/17/2019 4:56:48 PM By: Curtis Jenkins Entered By: Curtis Jenkins on 10/17/2019 12:56:44 -------------------------------------------------------------------------------- Multi Wound Chart Details Patient Name: Date of Service: Curtis Jenkins NA RD G. 10/17/2019 12:30 PM Medical Record Number: [id number] Patient Account Number: [id number] Date of Birth/Sex: Treating RN: 04-May-1935 (84 y.o. Curtis Jenkins Primary Care Kailand Seda: Curtis Jenkins Curtis Jenkins: Referring Antwoin Lackey: Treating Ulrich Soules/Extender: Curtis Jenkins in Treatment: 16 Vital Signs Height(in): 71 Pulse(bpm): 39 Weight(lbs): 250 Blood Pressure(mmHg): 147/74 Body Mass Index(BMI): 35 Temperature(F): 97.7 Respiratory Rate(breaths/min): 18 Photos: [1:No Photos Left, Lateral Calcaneus] [N/A:N/A N/A] Wound Location: [1:Blister] [N/A:N/A] Wounding Event: [1:Pressure Ulcer] [N/A:N/A] Primary Etiology: [1:05/12/2019] [N/A:N/A] Date Acquired: [1:16] [N/A:N/A] Weeks of Treatment: [1:Healed - Epithelialized] [N/A:N/A] Wound Status: [1:0x0x0] [N/A:N/A] Measurements L x W x D (cm) [1:0] [N/A:N/A] A (cm) : rea [1:0] [N/A:N/A] Volume (  cm) : [1:100.00%] [N/A:N/A] % Reduction in A rea: [1:100.00%] [N/A:N/A] % Reduction in Volume: [1:Category/Stage IV] [N/A:N/A] Classification: [1:None Present] [N/A:N/A] Exudate A mount: [1:Well defined, not attached] [N/A:N/A] Wound Margin: [1:None Present (0%)] [N/A:N/A] Granulation A mount: [1:Large (67-100%)] [N/A:N/A] Necrotic A mount: [1:Eschar] [N/A:N/A] Necrotic Tissue: [1:Fat Layer (Subcutaneous Tissue): Yes N/A] Exposed Structures: [1:Fascia: No Tendon: No Muscle: No Joint: No Bone: No Medium (34-66%)] [N/A:N/A] Epithelialization: [1:dry black eschar noted to wound bed. N/A] Treatment Notes Electronic Signature(s) Signed: 10/17/2019 4:54:01 PM By: Linton Ham MD Signed: 10/17/2019 5:03:51 PM By: Curtis Gouty RN, BSN Entered By: Linton Ham on 10/17/2019 14:40:02 -------------------------------------------------------------------------------- Multi-Disciplinary Care Plan Details Patient Name: Date of Service: Curtis Jenkins NA RD G. 10/17/2019 12:30 PM Medical Record Number: [id number] Patient Account Number: [id number] Date of Birth/Sex: Treating RN: 1936/01/02 (84 y.o. Curtis Jenkins Primary Care Jeison Delpilar: Curtis Jenkins Curtis Jenkins: Referring Kashus Karlen: Treating Alasdair Kleve/Extender: Curtis Jenkins in Treatment: 16 Active  Inactive Electronic Signature(s) Signed: 10/17/2019 5:03:51 PM By: Curtis Gouty RN, BSN Entered By: Curtis Jenkins on 10/17/2019 14:03:36 -------------------------------------------------------------------------------- Pain Assessment Details Patient Name: Date of Service: Curtis Jenkins NA RD G. 10/17/2019 12:30 PM Medical Record Number: [id number] Patient Account Number: [id number] Date of Birth/Sex: Treating RN: 1935-12-02 (84 y.o. [name] Primary Care Jerron Niblack: Curtis Jenkins Curtis Jenkins: Referring Cassadie Pankonin: Treating Kenyette Gundy/Extender: Curtis Jenkins in Treatment: 16 Active Problems Location of Pain Severity and Description of Pain Patient Has Paino No Site Locations Rate the pain. Current Pain Level: 0 Pain Management and Medication Current Pain Management: Medication: No Cold Application: No Rest: No Massage: No Activity: No T.E.N.S.: No Heat Application: No Leg drop or elevation: No Is the Current Pain Management Adequate: Adequate How does your wound impact your activities of daily livingo Sleep: No Bathing: No Appetite: No Relationship With Others: No Bladder Continence: No Emotions: No Bowel Continence: No Work: No Toileting: No Drive: No Dressing: No Hobbies: No Electronic Signature(s) Signed: 10/17/2019 4:56:48 PM By: Curtis Jenkins Entered By: Curtis Jenkins on 10/17/2019 12:56:29 -------------------------------------------------------------------------------- Patient/Caregiver Education Details Patient Name: Date of Service: Curtis Jenkins NA RD G. 10/4/2021andnbsp12:30 PM Medical Record Number: [id number] Patient Account Number: [id number] Date of Birth/Gender: Treating RN: 20-Dec-1935 (84 y.o. Curtis Jenkins Primary Care Physician: Curtis Jenkins Curtis Jenkins: Referring Physician: Treating Physician/Extender: Curtis Jenkins in Treatment: 16 Education Assessment Education  Provided To: Patient Education Topics Provided Wound/Skin Impairment: Methods: Explain/Verbal Responses: Reinforcements needed, State content correctly [employer]) Signed: 10/17/2019 5:03:51 PM By: Curtis Gouty RN, BSN Entered By: Curtis Jenkins on 10/17/2019 13:20:19 -------------------------------------------------------------------------------- Wound Assessment Details Patient Name: Date of Service: Curtis Jenkins NA RD G. 10/17/2019 12:30 PM Medical Record Number: [id number] Patient Account Number: [id number] Date of Birth/Sex: Treating RN: [birth year] (84 y.o. Curtis Jenkins Primary Care Candelario Steppe: Curtis Jenkins Curtis Jenkins: Referring Zoria Rawlinson: Treating Aima Mcwhirt/Extender: Curtis Jenkins in Treatment: 16 Wound Status Wound Number: 1 Primary Etiology: Pressure Ulcer Wound Location: Left, Lateral Calcaneus Wound Status: Healed - Epithelialized Wounding Event: Blister Date Acquired: 05/12/2019 Weeks Of Treatment: 16 Clustered Wound: No Wound Measurements Length: (cm) Width: (cm) Depth: (cm) Area: (cm) Volume: (cm) 0 % Reduction in Area: 100% 0 % Reduction in Volume: 100% 0 Epithelialization: Medium (34-66%) 0 Tunneling: No 0 Undermining: No Wound Description Classification: Category/Stage IV Wound Margin: Well defined, not attached Exudate Amount: None Present Foul Odor After Cleansing: No Slough/Fibrino No Wound Bed Granulation Amount: None Present (0%) Exposed Structure Necrotic  Amount: Large (67-100%) Fascia Exposed: No Necrotic Quality: Eschar Fat Layer (Subcutaneous Tissue) Exposed: Yes Tendon Exposed: No Muscle Exposed: No Joint Exposed: No Bone Exposed: No Assessment Notes dry black eschar noted to wound bed. Electronic Signature(s) Signed: 10/17/2019 5:03:51 PM By: Curtis Gouty RN, BSN Entered By: Curtis Jenkins on 10/17/2019  13:21:59 -------------------------------------------------------------------------------- [place] Details Patient Name: Date of Service: Curtis Jenkins NA RD G. 10/17/2019 12:30 PM Medical Record Number: [id number] Patient Account Number: [id number] Date of Birth/Sex: Treating RN: [birth year] (84 y.o. Curtis Jenkins, Curtis Jenkins Primary Care Zacharias Ridling: Curtis Jenkins Curtis Jenkins: Referring Torina Ey: Treating Aloysious Vangieson/Extender: Curtis Jenkins in Treatment: 16 Vital Signs Time Taken: 12:45 Temperature (F): 97.7 Height (in): 71 Pulse (bpm): 70 Weight (lbs): 250 Respiratory Rate (breaths/min): 18 Body Mass Index (BMI): 34.9 Blood Pressure (mmHg): 147/74 Reference Range: 80 - 120 mg / dl Electronic Signature(s) Signed: 10/17/2019 4:56:48 PM By: Curtis Jenkins Entered By: Curtis Jenkins on 10/17/2019 12:56:20

## 2019-10-20 DIAGNOSIS — M159 Polyosteoarthritis, unspecified: Secondary | ICD-10-CM | POA: Diagnosis not present

## 2019-10-20 DIAGNOSIS — R1310 Dysphagia, unspecified: Secondary | ICD-10-CM | POA: Diagnosis not present

## 2019-10-20 DIAGNOSIS — I69954 Hemiplegia and hemiparesis following unspecified cerebrovascular disease affecting left non-dominant side: Secondary | ICD-10-CM | POA: Diagnosis not present

## 2019-10-20 DIAGNOSIS — N182 Chronic kidney disease, stage 2 (mild): Secondary | ICD-10-CM | POA: Diagnosis not present

## 2019-10-20 DIAGNOSIS — I69391 Dysphagia following cerebral infarction: Secondary | ICD-10-CM | POA: Diagnosis not present

## 2019-10-20 DIAGNOSIS — L89620 Pressure ulcer of left heel, unstageable: Secondary | ICD-10-CM | POA: Diagnosis not present

## 2019-10-20 DIAGNOSIS — E785 Hyperlipidemia, unspecified: Secondary | ICD-10-CM | POA: Diagnosis not present

## 2019-10-20 DIAGNOSIS — M25422 Effusion, left elbow: Secondary | ICD-10-CM | POA: Diagnosis not present

## 2019-10-20 DIAGNOSIS — I129 Hypertensive chronic kidney disease with stage 1 through stage 4 chronic kidney disease, or unspecified chronic kidney disease: Secondary | ICD-10-CM | POA: Diagnosis not present

## 2019-10-20 DIAGNOSIS — Z720 Tobacco use: Secondary | ICD-10-CM | POA: Diagnosis not present

## 2019-10-21 ENCOUNTER — Other Ambulatory Visit: Payer: Self-pay | Admitting: Internal Medicine

## 2019-10-21 MED ORDER — AMOXICILLIN-POT CLAVULANATE 875-125 MG PO TABS
1.0000 | ORAL_TABLET | Freq: Two times a day (BID) | ORAL | 0 refills | Status: DC
Start: 2019-10-21 — End: 2020-09-19

## 2019-10-21 MED ORDER — DOXYCYCLINE HYCLATE 100 MG PO TABS: 100.0000 mg | ORAL_TABLET | Freq: Two times a day (BID) | ORAL | 0 refills | Status: DC

## 2019-10-25 DIAGNOSIS — I129 Hypertensive chronic kidney disease with stage 1 through stage 4 chronic kidney disease, or unspecified chronic kidney disease: Secondary | ICD-10-CM | POA: Diagnosis not present

## 2019-10-25 DIAGNOSIS — L89620 Pressure ulcer of left heel, unstageable: Secondary | ICD-10-CM | POA: Diagnosis not present

## 2019-10-25 DIAGNOSIS — I69954 Hemiplegia and hemiparesis following unspecified cerebrovascular disease affecting left non-dominant side: Secondary | ICD-10-CM | POA: Diagnosis not present

## 2019-10-25 DIAGNOSIS — N182 Chronic kidney disease, stage 2 (mild): Secondary | ICD-10-CM | POA: Diagnosis not present

## 2019-10-27 ENCOUNTER — Other Ambulatory Visit: Payer: Self-pay

## 2019-10-27 ENCOUNTER — Telehealth (INDEPENDENT_AMBULATORY_CARE_PROVIDER_SITE_OTHER): Payer: Medicare Other | Admitting: Internal Medicine

## 2019-10-27 DIAGNOSIS — I69954 Hemiplegia and hemiparesis following unspecified cerebrovascular disease affecting left non-dominant side: Secondary | ICD-10-CM | POA: Diagnosis not present

## 2019-10-27 DIAGNOSIS — I129 Hypertensive chronic kidney disease with stage 1 through stage 4 chronic kidney disease, or unspecified chronic kidney disease: Secondary | ICD-10-CM | POA: Diagnosis not present

## 2019-10-27 DIAGNOSIS — E785 Hyperlipidemia, unspecified: Secondary | ICD-10-CM | POA: Diagnosis not present

## 2019-10-27 DIAGNOSIS — M25422 Effusion, left elbow: Secondary | ICD-10-CM | POA: Diagnosis not present

## 2019-10-27 DIAGNOSIS — R1310 Dysphagia, unspecified: Secondary | ICD-10-CM | POA: Diagnosis not present

## 2019-10-27 DIAGNOSIS — L89620 Pressure ulcer of left heel, unstageable: Secondary | ICD-10-CM | POA: Diagnosis not present

## 2019-10-27 DIAGNOSIS — M159 Polyosteoarthritis, unspecified: Secondary | ICD-10-CM | POA: Diagnosis not present

## 2019-10-27 DIAGNOSIS — M86672 Other chronic osteomyelitis, left ankle and foot: Secondary | ICD-10-CM

## 2019-10-27 DIAGNOSIS — Z720 Tobacco use: Secondary | ICD-10-CM | POA: Diagnosis not present

## 2019-10-27 DIAGNOSIS — N182 Chronic kidney disease, stage 2 (mild): Secondary | ICD-10-CM | POA: Diagnosis not present

## 2019-10-27 DIAGNOSIS — I69391 Dysphagia following cerebral infarction: Secondary | ICD-10-CM | POA: Diagnosis not present

## 2019-10-27 NOTE — Progress Notes (Signed)
RFV: follow up on chronic wound infection/osteo Phone visit -identified by 2 identifiers Patient at home  and provider in clinic Patient ID: Curtis Jenkins, male   DOB: Jul 27, 1935, 84 y.o.   MRN: [id number]  HPI  Curtis Jenkins is a 84yo M with history of left heel osteomyelitis which was treated with ceftriaxone plus vancomycin for 6 wk which ended first week of September, then transitioned to amox/clav and doxy. However, just started this past week. He is still having some drainage but it is improved. Had seen dr Dellia Nims who felt that it is improving and recommended to keep heel elevated, so pressure improvied. Keep it elevated for healing - dr Dellia Nims released clinic  Outpatient Encounter Medications as of 10/27/2019  Medication Sig  . amLODipine (NORVASC) 5 MG tablet Take 5 mg by mouth daily.  Marland Kitchen amoxicillin-clavulanate (AUGMENTIN) 875-125 MG tablet Take 1 tablet by mouth 2 (two) times daily.  Marland Kitchen apixaban (ELIQUIS) 5 MG TABS tablet Take 2.5 mg by mouth 2 (two) times daily.  . calcium-vitamin D (OSCAL WITH D) 500-200 MG-UNIT tablet Take 1 tablet by mouth 2 (two) times daily.  . clotrimazole-betamethasone (LOTRISONE) cream Apply 1 application topically 2 (two) times daily as needed (irritation/rash.).   Marland Kitchen doxycycline (VIBRA-TABS) 100 MG tablet Take 1 tablet (100 mg total) by mouth 2 (two) times daily.  . feeding supplement, ENSURE ENLIVE, (ENSURE ENLIVE) LIQD Take 237 mLs by mouth 3 (three) times daily after meals. (Patient taking differently: Take 237 mLs by mouth daily. )  . Magnesium 200 MG TABS Take 200 mg by mouth in the morning and at bedtime.   . Methylcellulose, Laxative, (FIBER THERAPY) 500 MG TABS Take 500 mg by mouth in the morning and at bedtime.  . rosuvastatin (CRESTOR) 20 MG tablet Take 20 mg by mouth every evening.   . traMADol (ULTRAM) 50 MG tablet Take 50 mg by mouth 3 (three) times daily as needed (pain.).   Marland Kitchen zolpidem (AMBIEN) 10 MG tablet Take 10 mg by mouth at bedtime.     No facility-administered encounter medications on file as of 10/27/2019.     Patient Active Problem List   Diagnosis Date Noted  . PAF (paroxysmal atrial fibrillation) ([place]) 05/05/2019  . Effusion of left elbow 04/12/2019  . Chronic left shoulder pain 04/12/2019  . Rt Occipital Lobe Stroke with Lt hemiparesis 04/09/2019  . Dysphagia following cerebrovascular accident 04/09/2019  . Speech disturbance due to Acute CVA 04/09/2019  . Tobacco abuse-Chews Tobacco 04/09/2019  . Paresthesia   . Hypocalcemia 04/03/2019  . Prolonged QT interval 04/03/2019  . HTN (hypertension) 04/03/2019  . HLD (hyperlipidemia) 04/03/2019  . Weakness 04/03/2019     Health Maintenance Due  Topic Date Due  . URINE MICROALBUMIN  Never done  . COVID-19 Vaccine (1) Never done  . TETANUS/TDAP  Never done  . PNA vac Low Risk Adult (1 of 2 - PCV13) Never done  . INFLUENZA VACCINE  08/14/2019     Review of Systems 12 point ros is negative, except left heel wound. Physical Exam   On the phone/did not see patient CBC Lab Results  Component Value Date   WBC 6.8 08/09/2019   RBC 4.24 08/09/2019   HGB 12.4 (L) 08/09/2019   HCT 40.0 08/09/2019   PLT 310 08/09/2019   MCV 94.3 08/09/2019   MCH 29.2 08/09/2019   MCHC 31.0 08/09/2019   RDW 13.5 08/09/2019   LYMPHSABS 1.5 08/09/2019   MONOABS 0.7 08/09/2019   EOSABS 0.6 (H)  08/09/2019    BMET Lab Results  Component Value Date   NA 137 09/22/2019   K 4.3 09/22/2019   CL 101 09/22/2019   CO2 26 09/22/2019   GLUCOSE 89 09/22/2019   BUN 18 09/22/2019   CREATININE 1.41 (H) 09/22/2019   CALCIUM 7.9 (L) 09/22/2019   GFRNONAA 55 (L) 08/09/2019   GFRAA >60 08/09/2019      Assessment and Plan Chronic osteomyelitis of heel = continue on augmentin and doxycycline-  See back in 4 wk virtual visit so that we can see improvement

## 2019-10-29 LAB — CUP PACEART REMOTE DEVICE CHECK
Date Time Interrogation Session: 20211014230308
Implantable Pulse Generator Implant Date: 20210330

## 2019-11-02 ENCOUNTER — Encounter: Payer: Self-pay | Admitting: Internal Medicine

## 2019-11-02 ENCOUNTER — Other Ambulatory Visit: Payer: Self-pay

## 2019-11-02 ENCOUNTER — Ambulatory Visit: Payer: Medicare Other | Admitting: Internal Medicine

## 2019-11-02 VITALS — BP 110/66 | HR 57 | Ht 72.0 in | Wt 200.0 lb

## 2019-11-02 DIAGNOSIS — I480 Paroxysmal atrial fibrillation: Secondary | ICD-10-CM

## 2019-11-02 NOTE — Progress Notes (Signed)
HPI Curtis Jenkins returns today for followup. He is a pleasant 84 yo man with a cryptogenic stroke, s/p ILR insertion who was found to have PAF and placed on Eliquis. He had hematuria and the eliquis was initially stopped. He then had a urological procedure and eliquis restarted. He does not have palpitations. He has not had chest pain or sob. He has developed osteo of the heal and has had surgery. No fever or chills.  No Known Allergies   Current Outpatient Medications  Medication Sig Dispense Refill  . amLODipine (NORVASC) 5 MG tablet Take 5 mg by mouth daily.    Marland Kitchen amoxicillin-clavulanate (AUGMENTIN) 875-125 MG tablet Take 1 tablet by mouth 2 (two) times daily. 60 tablet 0  . apixaban (ELIQUIS) 5 MG TABS tablet Take 2.5 mg by mouth 2 (two) times daily.    . calcium-vitamin D (OSCAL WITH D) 500-200 MG-UNIT tablet Take 1 tablet by mouth 2 (two) times daily. 60 tablet 2  . clotrimazole-betamethasone (LOTRISONE) cream Apply 1 application topically 2 (two) times daily as needed (irritation/rash.).     Marland Kitchen doxycycline (VIBRA-TABS) 100 MG tablet Take 1 tablet (100 mg total) by mouth 2 (two) times daily. 60 tablet 0  . feeding supplement, ENSURE ENLIVE, (ENSURE ENLIVE) LIQD Take 237 mLs by mouth 3 (three) times daily after meals. (Patient taking differently: Take 237 mLs by mouth daily. ) 237 mL 12  . Magnesium 200 MG TABS Take 200 mg by mouth in the morning and at bedtime.     . Methylcellulose, Laxative, (FIBER THERAPY) 500 MG TABS Take 500 mg by mouth in the morning and at bedtime.    . rosuvastatin (CRESTOR) 20 MG tablet Take 20 mg by mouth every evening.     . traMADol (ULTRAM) 50 MG tablet Take 50 mg by mouth 3 (three) times daily as needed (pain.).     Marland Kitchen zolpidem (AMBIEN) 10 MG tablet Take 10 mg by mouth at bedtime.      No current facility-administered medications for this visit.     Past Medical History:  Diagnosis Date  . Arthritis   . Hyperlipidemia   . Hypertension   . Sleep  apnea   . Stroke [facility])    left sided weakness    ROS:   All systems reviewed and negative except as noted in the HPI.   Past Surgical History:  Procedure Laterality Date  . BACK SURGERY    . CYSTOSCOPY W/ RETROGRADES Bilateral 08/11/2019   Procedure: CYSTOSCOPY WITH RETROGRADE PYELOGRAM;  Surgeon: Cleon Gustin, MD;  Location: [facility];  Service: Urology;  Laterality: Bilateral;  . left knee surgery    . LOOP RECORDER INSERTION N/A 04/12/2019   Procedure: LOOP RECORDER INSERTION;  Surgeon: Evans Lance, MD;  Location: [place] [facility];  Service: Cardiovascular;  Laterality: N/A;  . TRANSURETHRAL RESECTION OF BLADDER TUMOR Bilateral 08/11/2019   Procedure: TRANSURETHRAL RESECTION OF BLADDER TUMOR (TURBT);  Surgeon: Cleon Gustin, MD;  Location: [facility];  Service: Urology;  Laterality: Bilateral;     No family history on file.   Social History   Socioeconomic History  . Marital status: Married    Spouse name: Not on file  . Number of children: Not on file  . Years of education: Not on file  . Highest education level: Not on file  Occupational History  . Not on file  Tobacco Use  . Smoking status: Never Smoker  . Smokeless tobacco: Former Systems developer  Types: Chew  Substance and Sexual Activity  . Alcohol use: Not Currently  . Drug use: Never  . Sexual activity: Not on file  Other Topics Concern  . Not on file  Social History Narrative  . Not on file   Social Determinants of Health   Financial Resource Strain:   . Difficulty of Paying Living Expenses: Not on file  Food Insecurity:   . Worried About Charity fundraiser in the Last Year: Not on file  . Ran Out of Food in the Last Year: Not on file  Transportation Needs:   . Lack of Transportation (Medical): Not on file  . Lack of Transportation (Non-Medical): Not on file  Physical Activity:   . Days of Exercise per Week: Not on file  . Minutes of Exercise per Session: Not on file  Stress:   . Feeling of  Stress : Not on file  Social Connections:   . Frequency of Communication with Friends and Family: Not on file  . Frequency of Social Gatherings with Friends and Family: Not on file  . Attends Religious Services: Not on file  . Active Member of Clubs or Organizations: Not on file  . Attends Archivist Meetings: Not on file  . Marital Status: Not on file  Intimate Partner Violence:   . Fear of Current or Ex-Partner: Not on file  . Emotionally Abused: Not on file  . Physically Abused: Not on file  . Sexually Abused: Not on file     BP 110/66   Pulse (!) 57   Ht 6' (1.829 m)   Wt 200 lb (90.7 kg)   SpO2 99%   BMI 27.12 kg/m   Physical Exam:  Well appearing NAD HEENT: Unremarkable Neck:  No JVD, no thyromegally Lymphatics:  No adenopathy Back:  No CVA tenderness Lungs:  Clear with no wheezes HEART:  Regular rate rhythm, no murmurs, no rubs, no clicks Abd:  soft, positive bowel sounds, no organomegally, no rebound, no guarding Ext:  2 plus pulses, no edema, no cyanosis, no clubbing Skin:  No rashes no nodules Neuro:  CN II through XII intact, motor grossly intact  DEVICE  Normal device function.  See PaceArt for details.   Assess/Plan: 1. PAF - he is asymptomatic and will continue his eliquis. 2. Stroke - he has not had additional symptoms. 3. HTN - his bp is well controlled today. We will follow. 4. Dyslipidemia - he will continue his statin therapy.  Carleene Overlie Yves Fodor,MD

## 2019-11-02 NOTE — Patient Instructions (Signed)
Medication Instructions:  Your physician recommends that you continue on your current medications as directed. Please refer to the Current Medication list given to you today.  *If you need a refill on your cardiac medications before your next appointment, please call your pharmacy*   Lab Work: NONE   If you have labs (blood work) drawn today and your tests are completely normal, you will receive your results only by: . MyChart Message (if you have MyChart) OR . A paper copy in the mail If you have any lab test that is abnormal or we need to change your treatment, we will call you to review the results.   Testing/Procedures: NONE    Follow-Up: At [facility], you and your health needs are our priority.  As part of our continuing mission to provide you with exceptional heart care, we have created designated Provider Care Teams.  These Care Teams include your primary Cardiologist (physician) and Advanced Practice Providers (APPs -  Physician Assistants and Nurse Practitioners) who all work together to provide you with the care you need, when you need it.  We recommend signing up for the patient portal called "MyChart".  Sign up information is provided on this After Visit Summary.  MyChart is used to connect with patients for Virtual Visits (Telemedicine).  Patients are able to view lab/test results, encounter notes, upcoming appointments, etc.  Non-urgent messages can be sent to your provider as well.   To learn more about what you can do with MyChart, go to [email or web address].com.    Your next appointment:   1 year(s)  The format for your next appointment:   In Person  Provider:   Gregg Taylor, MD   Other Instructions Thank you for choosing [facility]!    

## 2019-11-03 DIAGNOSIS — M25422 Effusion, left elbow: Secondary | ICD-10-CM | POA: Diagnosis not present

## 2019-11-03 DIAGNOSIS — I69954 Hemiplegia and hemiparesis following unspecified cerebrovascular disease affecting left non-dominant side: Secondary | ICD-10-CM | POA: Diagnosis not present

## 2019-11-03 DIAGNOSIS — L89620 Pressure ulcer of left heel, unstageable: Secondary | ICD-10-CM | POA: Diagnosis not present

## 2019-11-03 DIAGNOSIS — I129 Hypertensive chronic kidney disease with stage 1 through stage 4 chronic kidney disease, or unspecified chronic kidney disease: Secondary | ICD-10-CM | POA: Diagnosis not present

## 2019-11-03 DIAGNOSIS — N182 Chronic kidney disease, stage 2 (mild): Secondary | ICD-10-CM | POA: Diagnosis not present

## 2019-11-03 DIAGNOSIS — E785 Hyperlipidemia, unspecified: Secondary | ICD-10-CM | POA: Diagnosis not present

## 2019-11-03 DIAGNOSIS — R1310 Dysphagia, unspecified: Secondary | ICD-10-CM | POA: Diagnosis not present

## 2019-11-03 DIAGNOSIS — I69391 Dysphagia following cerebral infarction: Secondary | ICD-10-CM | POA: Diagnosis not present

## 2019-11-03 DIAGNOSIS — M159 Polyosteoarthritis, unspecified: Secondary | ICD-10-CM | POA: Diagnosis not present

## 2019-11-03 DIAGNOSIS — Z720 Tobacco use: Secondary | ICD-10-CM | POA: Diagnosis not present

## 2019-11-04 DIAGNOSIS — M25422 Effusion, left elbow: Secondary | ICD-10-CM | POA: Diagnosis not present

## 2019-11-04 DIAGNOSIS — I69954 Hemiplegia and hemiparesis following unspecified cerebrovascular disease affecting left non-dominant side: Secondary | ICD-10-CM | POA: Diagnosis not present

## 2019-11-04 DIAGNOSIS — E785 Hyperlipidemia, unspecified: Secondary | ICD-10-CM | POA: Diagnosis not present

## 2019-11-04 DIAGNOSIS — R1310 Dysphagia, unspecified: Secondary | ICD-10-CM | POA: Diagnosis not present

## 2019-11-04 DIAGNOSIS — N182 Chronic kidney disease, stage 2 (mild): Secondary | ICD-10-CM | POA: Diagnosis not present

## 2019-11-04 DIAGNOSIS — I69391 Dysphagia following cerebral infarction: Secondary | ICD-10-CM | POA: Diagnosis not present

## 2019-11-04 DIAGNOSIS — I129 Hypertensive chronic kidney disease with stage 1 through stage 4 chronic kidney disease, or unspecified chronic kidney disease: Secondary | ICD-10-CM | POA: Diagnosis not present

## 2019-11-04 DIAGNOSIS — M159 Polyosteoarthritis, unspecified: Secondary | ICD-10-CM | POA: Diagnosis not present

## 2019-11-04 DIAGNOSIS — Z720 Tobacco use: Secondary | ICD-10-CM | POA: Diagnosis not present

## 2019-11-04 DIAGNOSIS — L89620 Pressure ulcer of left heel, unstageable: Secondary | ICD-10-CM | POA: Diagnosis not present

## 2019-11-05 DIAGNOSIS — R1310 Dysphagia, unspecified: Secondary | ICD-10-CM | POA: Diagnosis not present

## 2019-11-05 DIAGNOSIS — Z720 Tobacco use: Secondary | ICD-10-CM | POA: Diagnosis not present

## 2019-11-05 DIAGNOSIS — M25422 Effusion, left elbow: Secondary | ICD-10-CM | POA: Diagnosis not present

## 2019-11-05 DIAGNOSIS — M159 Polyosteoarthritis, unspecified: Secondary | ICD-10-CM | POA: Diagnosis not present

## 2019-11-05 DIAGNOSIS — I69954 Hemiplegia and hemiparesis following unspecified cerebrovascular disease affecting left non-dominant side: Secondary | ICD-10-CM | POA: Diagnosis not present

## 2019-11-05 DIAGNOSIS — L89620 Pressure ulcer of left heel, unstageable: Secondary | ICD-10-CM | POA: Diagnosis not present

## 2019-11-05 DIAGNOSIS — E785 Hyperlipidemia, unspecified: Secondary | ICD-10-CM | POA: Diagnosis not present

## 2019-11-05 DIAGNOSIS — I129 Hypertensive chronic kidney disease with stage 1 through stage 4 chronic kidney disease, or unspecified chronic kidney disease: Secondary | ICD-10-CM | POA: Diagnosis not present

## 2019-11-05 DIAGNOSIS — N182 Chronic kidney disease, stage 2 (mild): Secondary | ICD-10-CM | POA: Diagnosis not present

## 2019-11-05 DIAGNOSIS — I69391 Dysphagia following cerebral infarction: Secondary | ICD-10-CM | POA: Diagnosis not present

## 2019-11-08 DIAGNOSIS — Z720 Tobacco use: Secondary | ICD-10-CM | POA: Diagnosis not present

## 2019-11-08 DIAGNOSIS — I69954 Hemiplegia and hemiparesis following unspecified cerebrovascular disease affecting left non-dominant side: Secondary | ICD-10-CM | POA: Diagnosis not present

## 2019-11-08 DIAGNOSIS — I129 Hypertensive chronic kidney disease with stage 1 through stage 4 chronic kidney disease, or unspecified chronic kidney disease: Secondary | ICD-10-CM | POA: Diagnosis not present

## 2019-11-08 DIAGNOSIS — R1310 Dysphagia, unspecified: Secondary | ICD-10-CM | POA: Diagnosis not present

## 2019-11-08 DIAGNOSIS — I69391 Dysphagia following cerebral infarction: Secondary | ICD-10-CM | POA: Diagnosis not present

## 2019-11-08 DIAGNOSIS — L89620 Pressure ulcer of left heel, unstageable: Secondary | ICD-10-CM | POA: Diagnosis not present

## 2019-11-08 DIAGNOSIS — N182 Chronic kidney disease, stage 2 (mild): Secondary | ICD-10-CM | POA: Diagnosis not present

## 2019-11-08 DIAGNOSIS — E785 Hyperlipidemia, unspecified: Secondary | ICD-10-CM | POA: Diagnosis not present

## 2019-11-08 DIAGNOSIS — M25422 Effusion, left elbow: Secondary | ICD-10-CM | POA: Diagnosis not present

## 2019-11-08 DIAGNOSIS — M159 Polyosteoarthritis, unspecified: Secondary | ICD-10-CM | POA: Diagnosis not present

## 2019-11-10 DIAGNOSIS — R1310 Dysphagia, unspecified: Secondary | ICD-10-CM | POA: Diagnosis not present

## 2019-11-10 DIAGNOSIS — I129 Hypertensive chronic kidney disease with stage 1 through stage 4 chronic kidney disease, or unspecified chronic kidney disease: Secondary | ICD-10-CM | POA: Diagnosis not present

## 2019-11-10 DIAGNOSIS — I639 Cerebral infarction, unspecified: Secondary | ICD-10-CM | POA: Diagnosis not present

## 2019-11-10 DIAGNOSIS — L89620 Pressure ulcer of left heel, unstageable: Secondary | ICD-10-CM | POA: Diagnosis not present

## 2019-11-10 DIAGNOSIS — Z720 Tobacco use: Secondary | ICD-10-CM | POA: Diagnosis not present

## 2019-11-10 DIAGNOSIS — N182 Chronic kidney disease, stage 2 (mild): Secondary | ICD-10-CM | POA: Diagnosis not present

## 2019-11-10 DIAGNOSIS — I69391 Dysphagia following cerebral infarction: Secondary | ICD-10-CM | POA: Diagnosis not present

## 2019-11-10 DIAGNOSIS — M159 Polyosteoarthritis, unspecified: Secondary | ICD-10-CM | POA: Diagnosis not present

## 2019-11-10 DIAGNOSIS — I69954 Hemiplegia and hemiparesis following unspecified cerebrovascular disease affecting left non-dominant side: Secondary | ICD-10-CM | POA: Diagnosis not present

## 2019-11-10 DIAGNOSIS — L8990 Pressure ulcer of unspecified site, unspecified stage: Secondary | ICD-10-CM | POA: Diagnosis not present

## 2019-11-10 DIAGNOSIS — M25422 Effusion, left elbow: Secondary | ICD-10-CM | POA: Diagnosis not present

## 2019-11-10 DIAGNOSIS — E785 Hyperlipidemia, unspecified: Secondary | ICD-10-CM | POA: Diagnosis not present

## 2019-11-13 DIAGNOSIS — R1310 Dysphagia, unspecified: Secondary | ICD-10-CM | POA: Diagnosis not present

## 2019-11-13 DIAGNOSIS — L8990 Pressure ulcer of unspecified site, unspecified stage: Secondary | ICD-10-CM | POA: Diagnosis not present

## 2019-11-13 DIAGNOSIS — I639 Cerebral infarction, unspecified: Secondary | ICD-10-CM | POA: Diagnosis not present

## 2019-11-15 DIAGNOSIS — R1310 Dysphagia, unspecified: Secondary | ICD-10-CM | POA: Diagnosis not present

## 2019-11-15 DIAGNOSIS — M25422 Effusion, left elbow: Secondary | ICD-10-CM | POA: Diagnosis not present

## 2019-11-15 DIAGNOSIS — I129 Hypertensive chronic kidney disease with stage 1 through stage 4 chronic kidney disease, or unspecified chronic kidney disease: Secondary | ICD-10-CM | POA: Diagnosis not present

## 2019-11-15 DIAGNOSIS — I69954 Hemiplegia and hemiparesis following unspecified cerebrovascular disease affecting left non-dominant side: Secondary | ICD-10-CM | POA: Diagnosis not present

## 2019-11-15 DIAGNOSIS — N182 Chronic kidney disease, stage 2 (mild): Secondary | ICD-10-CM | POA: Diagnosis not present

## 2019-11-15 DIAGNOSIS — E785 Hyperlipidemia, unspecified: Secondary | ICD-10-CM | POA: Diagnosis not present

## 2019-11-15 DIAGNOSIS — Z720 Tobacco use: Secondary | ICD-10-CM | POA: Diagnosis not present

## 2019-11-15 DIAGNOSIS — M159 Polyosteoarthritis, unspecified: Secondary | ICD-10-CM | POA: Diagnosis not present

## 2019-11-15 DIAGNOSIS — Z7902 Long term (current) use of antithrombotics/antiplatelets: Secondary | ICD-10-CM | POA: Diagnosis not present

## 2019-11-15 DIAGNOSIS — I69391 Dysphagia following cerebral infarction: Secondary | ICD-10-CM | POA: Diagnosis not present

## 2019-11-16 ENCOUNTER — Ambulatory Visit (INDEPENDENT_AMBULATORY_CARE_PROVIDER_SITE_OTHER): Payer: Medicare Other | Admitting: Urology

## 2019-11-16 ENCOUNTER — Other Ambulatory Visit: Payer: Self-pay

## 2019-11-16 ENCOUNTER — Encounter: Payer: Self-pay | Admitting: Urology

## 2019-11-16 VITALS — BP 147/68 | HR 79 | Temp 98.7°F | Ht 72.0 in | Wt 200.0 lb

## 2019-11-16 DIAGNOSIS — N329 Bladder disorder, unspecified: Secondary | ICD-10-CM | POA: Diagnosis not present

## 2019-11-16 MED ORDER — CIPROFLOXACIN HCL 500 MG PO TABS
500.0000 mg | ORAL_TABLET | Freq: Once | ORAL | Status: AC
  Administered 2019-11-16: 500 mg via ORAL

## 2019-11-16 NOTE — Addendum Note (Signed)
Addended byIris Pert on: 11/16/2019 04:10 PM   Modules accepted: Orders

## 2019-11-16 NOTE — Patient Instructions (Signed)
Hematuria, Adult Hematuria is blood in the urine. Blood may be visible in the urine, or it may be identified with a test. This condition can be caused by infections of the bladder, urethra, kidney, or prostate. Other possible causes include:  Kidney stones.  Cancer of the urinary tract.  Too much calcium in the urine.  Conditions that are passed from parent to child (inherited conditions).  Exercise that requires a lot of energy. Infections can usually be treated with medicine, and a kidney stone usually will pass through your urine. If neither of these is the cause of your hematuria, more tests may be needed to identify the cause of your symptoms. It is very important to tell your health care provider about any blood in your urine, even if it is painless or the blood stops without treatment. Blood in the urine, when it happens and then stops and then happens again, can be a symptom of a very serious condition, including cancer. There is no pain in the initial stages of many urinary cancers. Follow these instructions at home: Medicines  Take over-the-counter and prescription medicines only as told by your health care provider.  If you were prescribed an antibiotic medicine, take it as told by your health care provider. Do not stop taking the antibiotic even if you start to feel better. Eating and drinking  Drink enough fluid to keep your urine clear or pale yellow. It is recommended that you drink 3-4 quarts (2.8-3.8 L) a day. If you have been diagnosed with an infection, it is recommended that you drink cranberry juice in addition to large amounts of water.  Avoid caffeine, tea, and carbonated beverages. These tend to irritate the bladder.  Avoid alcohol because it may irritate the prostate (men). General instructions  If you have been diagnosed with a kidney stone, follow your health care provider's instructions about straining your urine to catch the stone.  Empty your bladder  often. Avoid holding urine for long periods of time.  If you are male: ? After a bowel movement, wipe from front to back and use each piece of toilet paper only once. ? Empty your bladder before and after sex.  Pay attention to any changes in your symptoms. Tell your health care provider about any changes or any new symptoms.  It is your responsibility to get your test results. Ask your health care provider, or the department performing the test, when your results will be ready.  Keep all follow-up visits as told by your health care provider. This is important. Contact a health care provider if:  You develop back pain.  You have a fever.  You have nausea or vomiting.  Your symptoms do not improve after 3 days.  Your symptoms get worse. Get help right away if:  You develop severe vomiting and are unable take medicine without vomiting.  You develop severe pain in your back or abdomen even though you are taking medicine.  You pass a large amount of blood in your urine.  You pass blood clots in your urine.  You feel very weak or like you might faint.  You faint. Summary  Hematuria is blood in the urine. It has many possible causes.  It is very important that you tell your health care provider about any blood in your urine, even if it is painless or the blood stops without treatment.  Take over-the-counter and prescription medicines only as told by your health care provider.  Drink enough fluid to keep   your urine clear or pale yellow. This information is not intended to replace advice given to you by your health care provider. Make sure you discuss any questions you have with your health care provider. Document Revised: 05/26/2018 Document Reviewed: 02/02/2016 Elsevier Patient Education  2020 [employer]  

## 2019-11-16 NOTE — Progress Notes (Signed)
Urological Symptom Review ° °Patient is experiencing the following symptoms: °Frequent urination °Hard to postpone urination ° ° °Review of Systems ° °Gastrointestinal (upper)  : °Negative for upper GI symptoms ° °Gastrointestinal (lower) : °Negative for lower GI symptoms ° °Constitutional : °Negative for symptoms ° °Skin: °Negative for skin symptoms ° °Eyes: °Negative for eye symptoms ° °Ear/Nose/Throat : °Negative for Ear/Nose/Throat symptoms ° °Hematologic/Lymphatic: °Negative for Hematologic/Lymphatic symptoms ° °Cardiovascular : °Negative for cardiovascular symptoms ° °Respiratory : °Negative for respiratory symptoms ° °Endocrine: °Negative for endocrine symptoms ° °Musculoskeletal: °Negative for musculoskeletal symptoms ° °Neurological: °Negative for neurological symptoms ° °Psychologic: °Negative for psychiatric symptoms ° °

## 2019-11-16 NOTE — Progress Notes (Signed)
   11/16/19  CC: followup benign bladder lesion  HPI: Mr Banet is a 84yo here for followup for a benign bladder lesion removed 3 months ago. No recent hematuria. No significant LUTS Blood pressure (!) 147/68, pulse 79, temperature 98.7 F (37.1 C), height 6' (1.829 m), weight 200 lb (90.7 kg). NED. A&Ox3.   No respiratory distress   Abd soft, NT, ND Normal phallus with bilateral descended testicles  Cystoscopy Procedure Note  Patient identification was confirmed, informed consent was obtained, and patient was prepped using Betadine solution.  Lidocaine jelly was administered per urethral meatus.     Pre-Procedure: - Inspection reveals a normal caliber ureteral meatus.  Procedure: The flexible cystoscope was introduced without difficulty - No urethral strictures/lesions are present. - Enlarged prostate  - Normal bladder neck - Bilateral ureteral orifices identified - Bladder mucosa  reveals left lateral wall/anterior wall fibrinous material but no definitive mass. At previous resection site - No bladder stones - No trabeculation   Post-Procedure: - Patient tolerated the procedure well  Assessment/ Plan: RTC 3 months for cystoscopy  No follow-ups on file.  Nicolette Bang, MD

## 2019-11-17 DIAGNOSIS — I69391 Dysphagia following cerebral infarction: Secondary | ICD-10-CM | POA: Diagnosis not present

## 2019-11-17 DIAGNOSIS — I129 Hypertensive chronic kidney disease with stage 1 through stage 4 chronic kidney disease, or unspecified chronic kidney disease: Secondary | ICD-10-CM | POA: Diagnosis not present

## 2019-11-17 DIAGNOSIS — M25422 Effusion, left elbow: Secondary | ICD-10-CM | POA: Diagnosis not present

## 2019-11-17 DIAGNOSIS — Z720 Tobacco use: Secondary | ICD-10-CM | POA: Diagnosis not present

## 2019-11-17 DIAGNOSIS — E785 Hyperlipidemia, unspecified: Secondary | ICD-10-CM | POA: Diagnosis not present

## 2019-11-17 DIAGNOSIS — I69954 Hemiplegia and hemiparesis following unspecified cerebrovascular disease affecting left non-dominant side: Secondary | ICD-10-CM | POA: Diagnosis not present

## 2019-11-17 DIAGNOSIS — N182 Chronic kidney disease, stage 2 (mild): Secondary | ICD-10-CM | POA: Diagnosis not present

## 2019-11-17 DIAGNOSIS — R1310 Dysphagia, unspecified: Secondary | ICD-10-CM | POA: Diagnosis not present

## 2019-11-17 DIAGNOSIS — Z7902 Long term (current) use of antithrombotics/antiplatelets: Secondary | ICD-10-CM | POA: Diagnosis not present

## 2019-11-17 DIAGNOSIS — M159 Polyosteoarthritis, unspecified: Secondary | ICD-10-CM | POA: Diagnosis not present

## 2019-11-22 DIAGNOSIS — N182 Chronic kidney disease, stage 2 (mild): Secondary | ICD-10-CM | POA: Diagnosis not present

## 2019-11-22 DIAGNOSIS — M159 Polyosteoarthritis, unspecified: Secondary | ICD-10-CM | POA: Diagnosis not present

## 2019-11-22 DIAGNOSIS — I69391 Dysphagia following cerebral infarction: Secondary | ICD-10-CM | POA: Diagnosis not present

## 2019-11-22 DIAGNOSIS — M25422 Effusion, left elbow: Secondary | ICD-10-CM | POA: Diagnosis not present

## 2019-11-22 DIAGNOSIS — R1310 Dysphagia, unspecified: Secondary | ICD-10-CM | POA: Diagnosis not present

## 2019-11-22 DIAGNOSIS — E785 Hyperlipidemia, unspecified: Secondary | ICD-10-CM | POA: Diagnosis not present

## 2019-11-22 DIAGNOSIS — I129 Hypertensive chronic kidney disease with stage 1 through stage 4 chronic kidney disease, or unspecified chronic kidney disease: Secondary | ICD-10-CM | POA: Diagnosis not present

## 2019-11-22 DIAGNOSIS — Z7902 Long term (current) use of antithrombotics/antiplatelets: Secondary | ICD-10-CM | POA: Diagnosis not present

## 2019-11-22 DIAGNOSIS — I69954 Hemiplegia and hemiparesis following unspecified cerebrovascular disease affecting left non-dominant side: Secondary | ICD-10-CM | POA: Diagnosis not present

## 2019-11-22 DIAGNOSIS — Z720 Tobacco use: Secondary | ICD-10-CM | POA: Diagnosis not present

## 2019-11-25 DIAGNOSIS — R1310 Dysphagia, unspecified: Secondary | ICD-10-CM | POA: Diagnosis not present

## 2019-11-25 DIAGNOSIS — M159 Polyosteoarthritis, unspecified: Secondary | ICD-10-CM | POA: Diagnosis not present

## 2019-11-25 DIAGNOSIS — Z720 Tobacco use: Secondary | ICD-10-CM | POA: Diagnosis not present

## 2019-11-25 DIAGNOSIS — N182 Chronic kidney disease, stage 2 (mild): Secondary | ICD-10-CM | POA: Diagnosis not present

## 2019-11-25 DIAGNOSIS — I69954 Hemiplegia and hemiparesis following unspecified cerebrovascular disease affecting left non-dominant side: Secondary | ICD-10-CM | POA: Diagnosis not present

## 2019-11-25 DIAGNOSIS — E785 Hyperlipidemia, unspecified: Secondary | ICD-10-CM | POA: Diagnosis not present

## 2019-11-25 DIAGNOSIS — I69391 Dysphagia following cerebral infarction: Secondary | ICD-10-CM | POA: Diagnosis not present

## 2019-11-25 DIAGNOSIS — I129 Hypertensive chronic kidney disease with stage 1 through stage 4 chronic kidney disease, or unspecified chronic kidney disease: Secondary | ICD-10-CM | POA: Diagnosis not present

## 2019-11-25 DIAGNOSIS — M25422 Effusion, left elbow: Secondary | ICD-10-CM | POA: Diagnosis not present

## 2019-11-25 DIAGNOSIS — Z7902 Long term (current) use of antithrombotics/antiplatelets: Secondary | ICD-10-CM | POA: Diagnosis not present

## 2019-11-30 DIAGNOSIS — H6122 Impacted cerumen, left ear: Secondary | ICD-10-CM | POA: Diagnosis not present

## 2019-11-30 DIAGNOSIS — H6121 Impacted cerumen, right ear: Secondary | ICD-10-CM | POA: Diagnosis not present

## 2019-11-30 DIAGNOSIS — M1991 Primary osteoarthritis, unspecified site: Secondary | ICD-10-CM | POA: Diagnosis not present

## 2019-11-30 DIAGNOSIS — M1711 Unilateral primary osteoarthritis, right knee: Secondary | ICD-10-CM | POA: Diagnosis not present

## 2019-11-30 LAB — CUP PACEART REMOTE DEVICE CHECK
Date Time Interrogation Session: 20211116230614
Implantable Pulse Generator Implant Date: 20210330

## 2019-12-01 ENCOUNTER — Ambulatory Visit (INDEPENDENT_AMBULATORY_CARE_PROVIDER_SITE_OTHER): Payer: Medicare Other

## 2019-12-01 DIAGNOSIS — M159 Polyosteoarthritis, unspecified: Secondary | ICD-10-CM | POA: Diagnosis not present

## 2019-12-01 DIAGNOSIS — I63531 Cerebral infarction due to unspecified occlusion or stenosis of right posterior cerebral artery: Secondary | ICD-10-CM

## 2019-12-01 DIAGNOSIS — I69954 Hemiplegia and hemiparesis following unspecified cerebrovascular disease affecting left non-dominant side: Secondary | ICD-10-CM | POA: Diagnosis not present

## 2019-12-01 DIAGNOSIS — Z7902 Long term (current) use of antithrombotics/antiplatelets: Secondary | ICD-10-CM | POA: Diagnosis not present

## 2019-12-01 DIAGNOSIS — E785 Hyperlipidemia, unspecified: Secondary | ICD-10-CM | POA: Diagnosis not present

## 2019-12-01 DIAGNOSIS — R1310 Dysphagia, unspecified: Secondary | ICD-10-CM | POA: Diagnosis not present

## 2019-12-01 DIAGNOSIS — I129 Hypertensive chronic kidney disease with stage 1 through stage 4 chronic kidney disease, or unspecified chronic kidney disease: Secondary | ICD-10-CM | POA: Diagnosis not present

## 2019-12-01 DIAGNOSIS — I69391 Dysphagia following cerebral infarction: Secondary | ICD-10-CM | POA: Diagnosis not present

## 2019-12-01 DIAGNOSIS — N182 Chronic kidney disease, stage 2 (mild): Secondary | ICD-10-CM | POA: Diagnosis not present

## 2019-12-01 DIAGNOSIS — Z720 Tobacco use: Secondary | ICD-10-CM | POA: Diagnosis not present

## 2019-12-01 DIAGNOSIS — M25422 Effusion, left elbow: Secondary | ICD-10-CM | POA: Diagnosis not present

## 2019-12-02 DIAGNOSIS — Z7902 Long term (current) use of antithrombotics/antiplatelets: Secondary | ICD-10-CM | POA: Diagnosis not present

## 2019-12-02 DIAGNOSIS — I69954 Hemiplegia and hemiparesis following unspecified cerebrovascular disease affecting left non-dominant side: Secondary | ICD-10-CM | POA: Diagnosis not present

## 2019-12-02 DIAGNOSIS — I69391 Dysphagia following cerebral infarction: Secondary | ICD-10-CM | POA: Diagnosis not present

## 2019-12-02 DIAGNOSIS — M159 Polyosteoarthritis, unspecified: Secondary | ICD-10-CM | POA: Diagnosis not present

## 2019-12-02 DIAGNOSIS — I129 Hypertensive chronic kidney disease with stage 1 through stage 4 chronic kidney disease, or unspecified chronic kidney disease: Secondary | ICD-10-CM | POA: Diagnosis not present

## 2019-12-02 DIAGNOSIS — E785 Hyperlipidemia, unspecified: Secondary | ICD-10-CM | POA: Diagnosis not present

## 2019-12-02 DIAGNOSIS — R1310 Dysphagia, unspecified: Secondary | ICD-10-CM | POA: Diagnosis not present

## 2019-12-02 DIAGNOSIS — N182 Chronic kidney disease, stage 2 (mild): Secondary | ICD-10-CM | POA: Diagnosis not present

## 2019-12-02 DIAGNOSIS — M25422 Effusion, left elbow: Secondary | ICD-10-CM | POA: Diagnosis not present

## 2019-12-02 DIAGNOSIS — Z720 Tobacco use: Secondary | ICD-10-CM | POA: Diagnosis not present

## 2019-12-02 NOTE — Progress Notes (Signed)
[facility] Summary Report / Loop Recorder 

## 2019-12-06 DIAGNOSIS — Z720 Tobacco use: Secondary | ICD-10-CM | POA: Diagnosis not present

## 2019-12-06 DIAGNOSIS — I69954 Hemiplegia and hemiparesis following unspecified cerebrovascular disease affecting left non-dominant side: Secondary | ICD-10-CM | POA: Diagnosis not present

## 2019-12-06 DIAGNOSIS — Z7902 Long term (current) use of antithrombotics/antiplatelets: Secondary | ICD-10-CM | POA: Diagnosis not present

## 2019-12-06 DIAGNOSIS — M25422 Effusion, left elbow: Secondary | ICD-10-CM | POA: Diagnosis not present

## 2019-12-06 DIAGNOSIS — I69391 Dysphagia following cerebral infarction: Secondary | ICD-10-CM | POA: Diagnosis not present

## 2019-12-06 DIAGNOSIS — N182 Chronic kidney disease, stage 2 (mild): Secondary | ICD-10-CM | POA: Diagnosis not present

## 2019-12-06 DIAGNOSIS — I129 Hypertensive chronic kidney disease with stage 1 through stage 4 chronic kidney disease, or unspecified chronic kidney disease: Secondary | ICD-10-CM | POA: Diagnosis not present

## 2019-12-06 DIAGNOSIS — E785 Hyperlipidemia, unspecified: Secondary | ICD-10-CM | POA: Diagnosis not present

## 2019-12-06 DIAGNOSIS — R1310 Dysphagia, unspecified: Secondary | ICD-10-CM | POA: Diagnosis not present

## 2019-12-06 DIAGNOSIS — M159 Polyosteoarthritis, unspecified: Secondary | ICD-10-CM | POA: Diagnosis not present

## 2019-12-11 DIAGNOSIS — L8990 Pressure ulcer of unspecified site, unspecified stage: Secondary | ICD-10-CM | POA: Diagnosis not present

## 2019-12-11 DIAGNOSIS — I639 Cerebral infarction, unspecified: Secondary | ICD-10-CM | POA: Diagnosis not present

## 2019-12-11 DIAGNOSIS — R1310 Dysphagia, unspecified: Secondary | ICD-10-CM | POA: Diagnosis not present

## 2019-12-12 DIAGNOSIS — E785 Hyperlipidemia, unspecified: Secondary | ICD-10-CM | POA: Diagnosis not present

## 2019-12-12 DIAGNOSIS — M159 Polyosteoarthritis, unspecified: Secondary | ICD-10-CM | POA: Diagnosis not present

## 2019-12-12 DIAGNOSIS — N182 Chronic kidney disease, stage 2 (mild): Secondary | ICD-10-CM | POA: Diagnosis not present

## 2019-12-12 DIAGNOSIS — I69391 Dysphagia following cerebral infarction: Secondary | ICD-10-CM | POA: Diagnosis not present

## 2019-12-12 DIAGNOSIS — M25422 Effusion, left elbow: Secondary | ICD-10-CM | POA: Diagnosis not present

## 2019-12-12 DIAGNOSIS — Z7902 Long term (current) use of antithrombotics/antiplatelets: Secondary | ICD-10-CM | POA: Diagnosis not present

## 2019-12-12 DIAGNOSIS — I129 Hypertensive chronic kidney disease with stage 1 through stage 4 chronic kidney disease, or unspecified chronic kidney disease: Secondary | ICD-10-CM | POA: Diagnosis not present

## 2019-12-12 DIAGNOSIS — I69954 Hemiplegia and hemiparesis following unspecified cerebrovascular disease affecting left non-dominant side: Secondary | ICD-10-CM | POA: Diagnosis not present

## 2019-12-12 DIAGNOSIS — R1310 Dysphagia, unspecified: Secondary | ICD-10-CM | POA: Diagnosis not present

## 2019-12-12 DIAGNOSIS — Z720 Tobacco use: Secondary | ICD-10-CM | POA: Diagnosis not present

## 2019-12-13 DIAGNOSIS — E1122 Type 2 diabetes mellitus with diabetic chronic kidney disease: Secondary | ICD-10-CM | POA: Diagnosis not present

## 2019-12-13 DIAGNOSIS — I69391 Dysphagia following cerebral infarction: Secondary | ICD-10-CM | POA: Diagnosis not present

## 2019-12-13 DIAGNOSIS — I129 Hypertensive chronic kidney disease with stage 1 through stage 4 chronic kidney disease, or unspecified chronic kidney disease: Secondary | ICD-10-CM | POA: Diagnosis not present

## 2019-12-13 DIAGNOSIS — N184 Chronic kidney disease, stage 4 (severe): Secondary | ICD-10-CM | POA: Diagnosis not present

## 2019-12-13 DIAGNOSIS — I69954 Hemiplegia and hemiparesis following unspecified cerebrovascular disease affecting left non-dominant side: Secondary | ICD-10-CM | POA: Diagnosis not present

## 2019-12-13 DIAGNOSIS — R1310 Dysphagia, unspecified: Secondary | ICD-10-CM | POA: Diagnosis not present

## 2019-12-13 DIAGNOSIS — N182 Chronic kidney disease, stage 2 (mild): Secondary | ICD-10-CM | POA: Diagnosis not present

## 2019-12-13 DIAGNOSIS — L8990 Pressure ulcer of unspecified site, unspecified stage: Secondary | ICD-10-CM | POA: Diagnosis not present

## 2019-12-13 DIAGNOSIS — I639 Cerebral infarction, unspecified: Secondary | ICD-10-CM | POA: Diagnosis not present

## 2019-12-14 DIAGNOSIS — M159 Polyosteoarthritis, unspecified: Secondary | ICD-10-CM | POA: Diagnosis not present

## 2019-12-14 DIAGNOSIS — Z7902 Long term (current) use of antithrombotics/antiplatelets: Secondary | ICD-10-CM | POA: Diagnosis not present

## 2019-12-14 DIAGNOSIS — Z720 Tobacco use: Secondary | ICD-10-CM | POA: Diagnosis not present

## 2019-12-14 DIAGNOSIS — E785 Hyperlipidemia, unspecified: Secondary | ICD-10-CM | POA: Diagnosis not present

## 2019-12-14 DIAGNOSIS — I69954 Hemiplegia and hemiparesis following unspecified cerebrovascular disease affecting left non-dominant side: Secondary | ICD-10-CM | POA: Diagnosis not present

## 2019-12-14 DIAGNOSIS — N182 Chronic kidney disease, stage 2 (mild): Secondary | ICD-10-CM | POA: Diagnosis not present

## 2019-12-14 DIAGNOSIS — I69391 Dysphagia following cerebral infarction: Secondary | ICD-10-CM | POA: Diagnosis not present

## 2019-12-14 DIAGNOSIS — I129 Hypertensive chronic kidney disease with stage 1 through stage 4 chronic kidney disease, or unspecified chronic kidney disease: Secondary | ICD-10-CM | POA: Diagnosis not present

## 2019-12-14 DIAGNOSIS — R1310 Dysphagia, unspecified: Secondary | ICD-10-CM | POA: Diagnosis not present

## 2019-12-14 DIAGNOSIS — M25422 Effusion, left elbow: Secondary | ICD-10-CM | POA: Diagnosis not present

## 2019-12-20 DIAGNOSIS — I69954 Hemiplegia and hemiparesis following unspecified cerebrovascular disease affecting left non-dominant side: Secondary | ICD-10-CM | POA: Diagnosis not present

## 2019-12-20 DIAGNOSIS — R1310 Dysphagia, unspecified: Secondary | ICD-10-CM | POA: Diagnosis not present

## 2019-12-20 DIAGNOSIS — M159 Polyosteoarthritis, unspecified: Secondary | ICD-10-CM | POA: Diagnosis not present

## 2019-12-20 DIAGNOSIS — N182 Chronic kidney disease, stage 2 (mild): Secondary | ICD-10-CM | POA: Diagnosis not present

## 2019-12-20 DIAGNOSIS — I129 Hypertensive chronic kidney disease with stage 1 through stage 4 chronic kidney disease, or unspecified chronic kidney disease: Secondary | ICD-10-CM | POA: Diagnosis not present

## 2019-12-20 DIAGNOSIS — E785 Hyperlipidemia, unspecified: Secondary | ICD-10-CM | POA: Diagnosis not present

## 2019-12-20 DIAGNOSIS — I69391 Dysphagia following cerebral infarction: Secondary | ICD-10-CM | POA: Diagnosis not present

## 2019-12-20 DIAGNOSIS — Z720 Tobacco use: Secondary | ICD-10-CM | POA: Diagnosis not present

## 2019-12-20 DIAGNOSIS — Z7902 Long term (current) use of antithrombotics/antiplatelets: Secondary | ICD-10-CM | POA: Diagnosis not present

## 2019-12-20 DIAGNOSIS — M25422 Effusion, left elbow: Secondary | ICD-10-CM | POA: Diagnosis not present

## 2019-12-22 DIAGNOSIS — M25422 Effusion, left elbow: Secondary | ICD-10-CM | POA: Diagnosis not present

## 2019-12-22 DIAGNOSIS — R1310 Dysphagia, unspecified: Secondary | ICD-10-CM | POA: Diagnosis not present

## 2019-12-22 DIAGNOSIS — I69954 Hemiplegia and hemiparesis following unspecified cerebrovascular disease affecting left non-dominant side: Secondary | ICD-10-CM | POA: Diagnosis not present

## 2019-12-22 DIAGNOSIS — Z7902 Long term (current) use of antithrombotics/antiplatelets: Secondary | ICD-10-CM | POA: Diagnosis not present

## 2019-12-22 DIAGNOSIS — Z720 Tobacco use: Secondary | ICD-10-CM | POA: Diagnosis not present

## 2019-12-22 DIAGNOSIS — I69391 Dysphagia following cerebral infarction: Secondary | ICD-10-CM | POA: Diagnosis not present

## 2019-12-22 DIAGNOSIS — N182 Chronic kidney disease, stage 2 (mild): Secondary | ICD-10-CM | POA: Diagnosis not present

## 2019-12-22 DIAGNOSIS — I129 Hypertensive chronic kidney disease with stage 1 through stage 4 chronic kidney disease, or unspecified chronic kidney disease: Secondary | ICD-10-CM | POA: Diagnosis not present

## 2019-12-22 DIAGNOSIS — M159 Polyosteoarthritis, unspecified: Secondary | ICD-10-CM | POA: Diagnosis not present

## 2019-12-22 DIAGNOSIS — E785 Hyperlipidemia, unspecified: Secondary | ICD-10-CM | POA: Diagnosis not present

## 2019-12-27 DIAGNOSIS — Z720 Tobacco use: Secondary | ICD-10-CM | POA: Diagnosis not present

## 2019-12-27 DIAGNOSIS — I69954 Hemiplegia and hemiparesis following unspecified cerebrovascular disease affecting left non-dominant side: Secondary | ICD-10-CM | POA: Diagnosis not present

## 2019-12-27 DIAGNOSIS — N182 Chronic kidney disease, stage 2 (mild): Secondary | ICD-10-CM | POA: Diagnosis not present

## 2019-12-27 DIAGNOSIS — R1310 Dysphagia, unspecified: Secondary | ICD-10-CM | POA: Diagnosis not present

## 2019-12-27 DIAGNOSIS — I129 Hypertensive chronic kidney disease with stage 1 through stage 4 chronic kidney disease, or unspecified chronic kidney disease: Secondary | ICD-10-CM | POA: Diagnosis not present

## 2019-12-27 DIAGNOSIS — M159 Polyosteoarthritis, unspecified: Secondary | ICD-10-CM | POA: Diagnosis not present

## 2019-12-27 DIAGNOSIS — M25422 Effusion, left elbow: Secondary | ICD-10-CM | POA: Diagnosis not present

## 2019-12-27 DIAGNOSIS — I69391 Dysphagia following cerebral infarction: Secondary | ICD-10-CM | POA: Diagnosis not present

## 2019-12-27 DIAGNOSIS — Z7902 Long term (current) use of antithrombotics/antiplatelets: Secondary | ICD-10-CM | POA: Diagnosis not present

## 2019-12-27 DIAGNOSIS — E785 Hyperlipidemia, unspecified: Secondary | ICD-10-CM | POA: Diagnosis not present

## 2019-12-28 ENCOUNTER — Other Ambulatory Visit: Payer: Self-pay | Admitting: Student

## 2019-12-30 DIAGNOSIS — M25422 Effusion, left elbow: Secondary | ICD-10-CM | POA: Diagnosis not present

## 2019-12-30 DIAGNOSIS — N182 Chronic kidney disease, stage 2 (mild): Secondary | ICD-10-CM | POA: Diagnosis not present

## 2019-12-30 DIAGNOSIS — Z720 Tobacco use: Secondary | ICD-10-CM | POA: Diagnosis not present

## 2019-12-30 DIAGNOSIS — I69954 Hemiplegia and hemiparesis following unspecified cerebrovascular disease affecting left non-dominant side: Secondary | ICD-10-CM | POA: Diagnosis not present

## 2019-12-30 DIAGNOSIS — R1310 Dysphagia, unspecified: Secondary | ICD-10-CM | POA: Diagnosis not present

## 2019-12-30 DIAGNOSIS — M159 Polyosteoarthritis, unspecified: Secondary | ICD-10-CM | POA: Diagnosis not present

## 2019-12-30 DIAGNOSIS — E785 Hyperlipidemia, unspecified: Secondary | ICD-10-CM | POA: Diagnosis not present

## 2019-12-30 DIAGNOSIS — Z7902 Long term (current) use of antithrombotics/antiplatelets: Secondary | ICD-10-CM | POA: Diagnosis not present

## 2019-12-30 DIAGNOSIS — I129 Hypertensive chronic kidney disease with stage 1 through stage 4 chronic kidney disease, or unspecified chronic kidney disease: Secondary | ICD-10-CM | POA: Diagnosis not present

## 2019-12-30 DIAGNOSIS — I69391 Dysphagia following cerebral infarction: Secondary | ICD-10-CM | POA: Diagnosis not present

## 2020-01-02 DIAGNOSIS — Z720 Tobacco use: Secondary | ICD-10-CM | POA: Diagnosis not present

## 2020-01-02 DIAGNOSIS — Z7902 Long term (current) use of antithrombotics/antiplatelets: Secondary | ICD-10-CM | POA: Diagnosis not present

## 2020-01-02 DIAGNOSIS — M25422 Effusion, left elbow: Secondary | ICD-10-CM | POA: Diagnosis not present

## 2020-01-02 DIAGNOSIS — R1310 Dysphagia, unspecified: Secondary | ICD-10-CM | POA: Diagnosis not present

## 2020-01-02 DIAGNOSIS — I69954 Hemiplegia and hemiparesis following unspecified cerebrovascular disease affecting left non-dominant side: Secondary | ICD-10-CM | POA: Diagnosis not present

## 2020-01-02 DIAGNOSIS — M159 Polyosteoarthritis, unspecified: Secondary | ICD-10-CM | POA: Diagnosis not present

## 2020-01-02 DIAGNOSIS — N182 Chronic kidney disease, stage 2 (mild): Secondary | ICD-10-CM | POA: Diagnosis not present

## 2020-01-02 DIAGNOSIS — I129 Hypertensive chronic kidney disease with stage 1 through stage 4 chronic kidney disease, or unspecified chronic kidney disease: Secondary | ICD-10-CM | POA: Diagnosis not present

## 2020-01-02 DIAGNOSIS — I69391 Dysphagia following cerebral infarction: Secondary | ICD-10-CM | POA: Diagnosis not present

## 2020-01-02 DIAGNOSIS — E785 Hyperlipidemia, unspecified: Secondary | ICD-10-CM | POA: Diagnosis not present

## 2020-01-03 ENCOUNTER — Ambulatory Visit (INDEPENDENT_AMBULATORY_CARE_PROVIDER_SITE_OTHER): Payer: Medicare Other

## 2020-01-03 DIAGNOSIS — I63531 Cerebral infarction due to unspecified occlusion or stenosis of right posterior cerebral artery: Secondary | ICD-10-CM | POA: Diagnosis not present

## 2020-01-03 LAB — CUP PACEART REMOTE DEVICE CHECK
Date Time Interrogation Session: 20211219230705
Implantable Pulse Generator Implant Date: 20210330

## 2020-01-10 DIAGNOSIS — I639 Cerebral infarction, unspecified: Secondary | ICD-10-CM | POA: Diagnosis not present

## 2020-01-10 DIAGNOSIS — M25422 Effusion, left elbow: Secondary | ICD-10-CM | POA: Diagnosis not present

## 2020-01-10 DIAGNOSIS — I69954 Hemiplegia and hemiparesis following unspecified cerebrovascular disease affecting left non-dominant side: Secondary | ICD-10-CM | POA: Diagnosis not present

## 2020-01-10 DIAGNOSIS — I69391 Dysphagia following cerebral infarction: Secondary | ICD-10-CM | POA: Diagnosis not present

## 2020-01-10 DIAGNOSIS — N182 Chronic kidney disease, stage 2 (mild): Secondary | ICD-10-CM | POA: Diagnosis not present

## 2020-01-10 DIAGNOSIS — E785 Hyperlipidemia, unspecified: Secondary | ICD-10-CM | POA: Diagnosis not present

## 2020-01-10 DIAGNOSIS — Z7902 Long term (current) use of antithrombotics/antiplatelets: Secondary | ICD-10-CM | POA: Diagnosis not present

## 2020-01-10 DIAGNOSIS — M159 Polyosteoarthritis, unspecified: Secondary | ICD-10-CM | POA: Diagnosis not present

## 2020-01-10 DIAGNOSIS — L8990 Pressure ulcer of unspecified site, unspecified stage: Secondary | ICD-10-CM | POA: Diagnosis not present

## 2020-01-10 DIAGNOSIS — I129 Hypertensive chronic kidney disease with stage 1 through stage 4 chronic kidney disease, or unspecified chronic kidney disease: Secondary | ICD-10-CM | POA: Diagnosis not present

## 2020-01-10 DIAGNOSIS — R1310 Dysphagia, unspecified: Secondary | ICD-10-CM | POA: Diagnosis not present

## 2020-01-10 DIAGNOSIS — Z720 Tobacco use: Secondary | ICD-10-CM | POA: Diagnosis not present

## 2020-01-11 DIAGNOSIS — Z681 Body mass index (BMI) 19 or less, adult: Secondary | ICD-10-CM | POA: Diagnosis not present

## 2020-01-11 DIAGNOSIS — H6122 Impacted cerumen, left ear: Secondary | ICD-10-CM | POA: Diagnosis not present

## 2020-01-11 DIAGNOSIS — M1711 Unilateral primary osteoarthritis, right knee: Secondary | ICD-10-CM | POA: Diagnosis not present

## 2020-01-11 DIAGNOSIS — H6121 Impacted cerumen, right ear: Secondary | ICD-10-CM | POA: Diagnosis not present

## 2020-01-12 DIAGNOSIS — E785 Hyperlipidemia, unspecified: Secondary | ICD-10-CM | POA: Diagnosis not present

## 2020-01-12 DIAGNOSIS — Z720 Tobacco use: Secondary | ICD-10-CM | POA: Diagnosis not present

## 2020-01-12 DIAGNOSIS — I129 Hypertensive chronic kidney disease with stage 1 through stage 4 chronic kidney disease, or unspecified chronic kidney disease: Secondary | ICD-10-CM | POA: Diagnosis not present

## 2020-01-12 DIAGNOSIS — N182 Chronic kidney disease, stage 2 (mild): Secondary | ICD-10-CM | POA: Diagnosis not present

## 2020-01-12 DIAGNOSIS — I69954 Hemiplegia and hemiparesis following unspecified cerebrovascular disease affecting left non-dominant side: Secondary | ICD-10-CM | POA: Diagnosis not present

## 2020-01-12 DIAGNOSIS — M25422 Effusion, left elbow: Secondary | ICD-10-CM | POA: Diagnosis not present

## 2020-01-12 DIAGNOSIS — M159 Polyosteoarthritis, unspecified: Secondary | ICD-10-CM | POA: Diagnosis not present

## 2020-01-12 DIAGNOSIS — Z7902 Long term (current) use of antithrombotics/antiplatelets: Secondary | ICD-10-CM | POA: Diagnosis not present

## 2020-01-12 DIAGNOSIS — I69391 Dysphagia following cerebral infarction: Secondary | ICD-10-CM | POA: Diagnosis not present

## 2020-01-12 DIAGNOSIS — R1310 Dysphagia, unspecified: Secondary | ICD-10-CM | POA: Diagnosis not present

## 2020-01-13 DIAGNOSIS — R1310 Dysphagia, unspecified: Secondary | ICD-10-CM | POA: Diagnosis not present

## 2020-01-13 DIAGNOSIS — I639 Cerebral infarction, unspecified: Secondary | ICD-10-CM | POA: Diagnosis not present

## 2020-01-13 DIAGNOSIS — I129 Hypertensive chronic kidney disease with stage 1 through stage 4 chronic kidney disease, or unspecified chronic kidney disease: Secondary | ICD-10-CM | POA: Diagnosis not present

## 2020-01-13 DIAGNOSIS — E1122 Type 2 diabetes mellitus with diabetic chronic kidney disease: Secondary | ICD-10-CM | POA: Diagnosis not present

## 2020-01-13 DIAGNOSIS — N184 Chronic kidney disease, stage 4 (severe): Secondary | ICD-10-CM | POA: Diagnosis not present

## 2020-01-13 DIAGNOSIS — L8990 Pressure ulcer of unspecified site, unspecified stage: Secondary | ICD-10-CM | POA: Diagnosis not present

## 2020-01-17 DIAGNOSIS — R531 Weakness: Secondary | ICD-10-CM | POA: Diagnosis not present

## 2020-01-17 DIAGNOSIS — R262 Difficulty in walking, not elsewhere classified: Secondary | ICD-10-CM | POA: Diagnosis not present

## 2020-01-18 NOTE — Progress Notes (Signed)
[facility] Summary Report / Loop Recorder 

## 2020-01-19 DIAGNOSIS — E785 Hyperlipidemia, unspecified: Secondary | ICD-10-CM | POA: Diagnosis not present

## 2020-01-19 DIAGNOSIS — M159 Polyosteoarthritis, unspecified: Secondary | ICD-10-CM | POA: Diagnosis not present

## 2020-01-19 DIAGNOSIS — N182 Chronic kidney disease, stage 2 (mild): Secondary | ICD-10-CM | POA: Diagnosis not present

## 2020-01-19 DIAGNOSIS — I69954 Hemiplegia and hemiparesis following unspecified cerebrovascular disease affecting left non-dominant side: Secondary | ICD-10-CM | POA: Diagnosis not present

## 2020-01-19 DIAGNOSIS — R1310 Dysphagia, unspecified: Secondary | ICD-10-CM | POA: Diagnosis not present

## 2020-01-19 DIAGNOSIS — M25422 Effusion, left elbow: Secondary | ICD-10-CM | POA: Diagnosis not present

## 2020-01-19 DIAGNOSIS — Z720 Tobacco use: Secondary | ICD-10-CM | POA: Diagnosis not present

## 2020-01-19 DIAGNOSIS — Z7902 Long term (current) use of antithrombotics/antiplatelets: Secondary | ICD-10-CM | POA: Diagnosis not present

## 2020-01-19 DIAGNOSIS — I69391 Dysphagia following cerebral infarction: Secondary | ICD-10-CM | POA: Diagnosis not present

## 2020-01-19 DIAGNOSIS — I129 Hypertensive chronic kidney disease with stage 1 through stage 4 chronic kidney disease, or unspecified chronic kidney disease: Secondary | ICD-10-CM | POA: Diagnosis not present

## 2020-01-21 DIAGNOSIS — I69391 Dysphagia following cerebral infarction: Secondary | ICD-10-CM | POA: Diagnosis not present

## 2020-01-21 DIAGNOSIS — E785 Hyperlipidemia, unspecified: Secondary | ICD-10-CM | POA: Diagnosis not present

## 2020-01-21 DIAGNOSIS — I69954 Hemiplegia and hemiparesis following unspecified cerebrovascular disease affecting left non-dominant side: Secondary | ICD-10-CM | POA: Diagnosis not present

## 2020-01-21 DIAGNOSIS — M159 Polyosteoarthritis, unspecified: Secondary | ICD-10-CM | POA: Diagnosis not present

## 2020-01-21 DIAGNOSIS — Z7902 Long term (current) use of antithrombotics/antiplatelets: Secondary | ICD-10-CM | POA: Diagnosis not present

## 2020-01-21 DIAGNOSIS — Z720 Tobacco use: Secondary | ICD-10-CM | POA: Diagnosis not present

## 2020-01-21 DIAGNOSIS — R1310 Dysphagia, unspecified: Secondary | ICD-10-CM | POA: Diagnosis not present

## 2020-01-21 DIAGNOSIS — I129 Hypertensive chronic kidney disease with stage 1 through stage 4 chronic kidney disease, or unspecified chronic kidney disease: Secondary | ICD-10-CM | POA: Diagnosis not present

## 2020-01-21 DIAGNOSIS — N182 Chronic kidney disease, stage 2 (mild): Secondary | ICD-10-CM | POA: Diagnosis not present

## 2020-01-21 DIAGNOSIS — M25422 Effusion, left elbow: Secondary | ICD-10-CM | POA: Diagnosis not present

## 2020-01-26 DIAGNOSIS — N182 Chronic kidney disease, stage 2 (mild): Secondary | ICD-10-CM | POA: Diagnosis not present

## 2020-01-26 DIAGNOSIS — I129 Hypertensive chronic kidney disease with stage 1 through stage 4 chronic kidney disease, or unspecified chronic kidney disease: Secondary | ICD-10-CM | POA: Diagnosis not present

## 2020-01-26 DIAGNOSIS — M25422 Effusion, left elbow: Secondary | ICD-10-CM | POA: Diagnosis not present

## 2020-01-26 DIAGNOSIS — R1310 Dysphagia, unspecified: Secondary | ICD-10-CM | POA: Diagnosis not present

## 2020-01-26 DIAGNOSIS — Z720 Tobacco use: Secondary | ICD-10-CM | POA: Diagnosis not present

## 2020-01-26 DIAGNOSIS — Z7902 Long term (current) use of antithrombotics/antiplatelets: Secondary | ICD-10-CM | POA: Diagnosis not present

## 2020-01-26 DIAGNOSIS — I69954 Hemiplegia and hemiparesis following unspecified cerebrovascular disease affecting left non-dominant side: Secondary | ICD-10-CM | POA: Diagnosis not present

## 2020-01-26 DIAGNOSIS — E785 Hyperlipidemia, unspecified: Secondary | ICD-10-CM | POA: Diagnosis not present

## 2020-01-26 DIAGNOSIS — M159 Polyosteoarthritis, unspecified: Secondary | ICD-10-CM | POA: Diagnosis not present

## 2020-01-26 DIAGNOSIS — I69391 Dysphagia following cerebral infarction: Secondary | ICD-10-CM | POA: Diagnosis not present

## 2020-01-29 ENCOUNTER — Other Ambulatory Visit: Payer: Self-pay | Admitting: Student

## 2020-02-01 ENCOUNTER — Telehealth: Payer: Self-pay | Admitting: Internal Medicine

## 2020-02-01 MED ORDER — APIXABAN 5 MG PO TABS: 5.0000 mg | ORAL_TABLET | Freq: Two times a day (BID) | ORAL | 6 refills | Status: DC

## 2020-02-01 NOTE — Telephone Encounter (Signed)
I spoke with Ladale Sherburn [phone] and she states before patient had bladder tumor removed, his Eliquis was decreased by Dr.Taylor to 2.5 mg BID. Now, tumor has been removed and his dose is back to 5 mg BID.She accidentally didn't realize he had run out of Eliquis. E-scribed to [facility].

## 2020-02-01 NOTE — Telephone Encounter (Signed)
Needs refill of Eliquis, 5mg , twice a day  sent to [facility]. They do not have request on file. He has missed his meds last night and this morning per call from Eileen Croswell [phone].

## 2020-02-04 LAB — CUP PACEART REMOTE DEVICE CHECK
Date Time Interrogation Session: 20220121230557
Implantable Pulse Generator Implant Date: 20210330

## 2020-02-06 ENCOUNTER — Ambulatory Visit (INDEPENDENT_AMBULATORY_CARE_PROVIDER_SITE_OTHER): Payer: Medicare Other

## 2020-02-06 DIAGNOSIS — I63531 Cerebral infarction due to unspecified occlusion or stenosis of right posterior cerebral artery: Secondary | ICD-10-CM | POA: Diagnosis not present

## 2020-02-10 ENCOUNTER — Emergency Department (HOSPITAL_COMMUNITY)
Admission: EM | Admit: 2020-02-10 | Discharge: 2020-02-10 | Disposition: A | Discharge status: Home / Self Care | Payer: Medicare Other | Attending: Emergency Medicine | Admitting: Emergency Medicine

## 2020-02-10 ENCOUNTER — Emergency Department (HOSPITAL_COMMUNITY): Payer: Medicare Other

## 2020-02-10 ENCOUNTER — Encounter (HOSPITAL_COMMUNITY): Payer: Self-pay | Admitting: Emergency Medicine

## 2020-02-10 DIAGNOSIS — Z7901 Long term (current) use of anticoagulants: Secondary | ICD-10-CM | POA: Insufficient documentation

## 2020-02-10 DIAGNOSIS — U071 COVID-19: Secondary | ICD-10-CM | POA: Diagnosis not present

## 2020-02-10 DIAGNOSIS — Z743 Need for continuous supervision: Secondary | ICD-10-CM | POA: Diagnosis not present

## 2020-02-10 DIAGNOSIS — L8990 Pressure ulcer of unspecified site, unspecified stage: Secondary | ICD-10-CM | POA: Diagnosis not present

## 2020-02-10 DIAGNOSIS — Z87891 Personal history of nicotine dependence: Secondary | ICD-10-CM | POA: Diagnosis not present

## 2020-02-10 DIAGNOSIS — E860 Dehydration: Secondary | ICD-10-CM | POA: Diagnosis not present

## 2020-02-10 DIAGNOSIS — N179 Acute kidney failure, unspecified: Secondary | ICD-10-CM | POA: Diagnosis not present

## 2020-02-10 DIAGNOSIS — R0689 Other abnormalities of breathing: Secondary | ICD-10-CM | POA: Diagnosis not present

## 2020-02-10 DIAGNOSIS — R531 Weakness: Secondary | ICD-10-CM | POA: Diagnosis not present

## 2020-02-10 DIAGNOSIS — R29898 Other symptoms and signs involving the musculoskeletal system: Secondary | ICD-10-CM | POA: Diagnosis not present

## 2020-02-10 DIAGNOSIS — Z79899 Other long term (current) drug therapy: Secondary | ICD-10-CM | POA: Diagnosis not present

## 2020-02-10 DIAGNOSIS — N3000 Acute cystitis without hematuria: Secondary | ICD-10-CM | POA: Diagnosis not present

## 2020-02-10 DIAGNOSIS — R1310 Dysphagia, unspecified: Secondary | ICD-10-CM | POA: Diagnosis not present

## 2020-02-10 DIAGNOSIS — R4781 Slurred speech: Secondary | ICD-10-CM | POA: Insufficient documentation

## 2020-02-10 DIAGNOSIS — I10 Essential (primary) hypertension: Secondary | ICD-10-CM | POA: Diagnosis not present

## 2020-02-10 DIAGNOSIS — I639 Cerebral infarction, unspecified: Secondary | ICD-10-CM | POA: Diagnosis not present

## 2020-02-10 LAB — URINALYSIS, ROUTINE W REFLEX MICROSCOPIC
Bilirubin Urine: NEGATIVE
Glucose, UA: NEGATIVE mg/dL
Ketones, ur: NEGATIVE mg/dL
Nitrite: NEGATIVE
Protein, ur: 100 mg/dL — AB
RBC / HPF: >50 RBC/hpf — ABNORMAL HIGH (ref 0–5)
Specific Gravity, Urine: 1.018 (ref 1.005–1.030)
WBC, UA: >50 WBC/hpf — ABNORMAL HIGH (ref 0–5)
pH: 5 (ref 5.0–8.0)

## 2020-02-10 LAB — CBC WITH DIFFERENTIAL/PLATELET
Abs Immature Granulocytes: 0.02 10*3/uL (ref 0.00–0.07)
Basophils Absolute: 0 10*3/uL (ref 0.0–0.1)
Basophils Relative: 0 %
Eosinophils Absolute: 0.1 10*3/uL (ref 0.0–0.5)
Eosinophils Relative: 1 %
HCT: 37.7 % — ABNORMAL LOW (ref 39.0–52.0)
Hemoglobin: 12.1 g/dL — ABNORMAL LOW (ref 13.0–17.0)
Immature Granulocytes: 1 %
Lymphocytes Relative: 34 %
Lymphs Abs: 1.4 10*3/uL (ref 0.7–4.0)
MCH: 30.4 pg (ref 26.0–34.0)
MCHC: 32.1 g/dL (ref 30.0–36.0)
MCV: 94.7 fL (ref 80.0–100.0)
Monocytes Absolute: 0.6 10*3/uL (ref 0.1–1.0)
Monocytes Relative: 15 %
Neutro Abs: 1.9 10*3/uL (ref 1.7–7.7)
Neutrophils Relative %: 49 %
Platelets: 107 10*3/uL — ABNORMAL LOW (ref 150–400)
RBC: 3.98 MIL/uL — ABNORMAL LOW (ref 4.22–5.81)
RDW: 12.9 % (ref 11.5–15.5)
WBC: 3.9 10*3/uL — ABNORMAL LOW (ref 4.0–10.5)
nRBC: 0 % (ref 0.0–0.2)

## 2020-02-10 LAB — COMPREHENSIVE METABOLIC PANEL
ALT: 15 U/L (ref 0–44)
AST: 26 U/L (ref 15–41)
Albumin: 3.2 g/dL — ABNORMAL LOW (ref 3.5–5.0)
Alkaline Phosphatase: 59 U/L (ref 38–126)
Anion gap: 12 (ref 5–15)
BUN: 29 mg/dL — ABNORMAL HIGH (ref 8–23)
CO2: 24 mmol/L (ref 22–32)
Calcium: 8 mg/dL — ABNORMAL LOW (ref 8.9–10.3)
Chloride: 98 mmol/L (ref 98–111)
Creatinine, Ser: 2.06 mg/dL — ABNORMAL HIGH (ref 0.61–1.24)
GFR, Estimated: 31 mL/min — ABNORMAL LOW (ref >60)
Glucose, Bld: 111 mg/dL — ABNORMAL HIGH (ref 70–99)
Potassium: 4.5 mmol/L (ref 3.5–5.1)
Sodium: 134 mmol/L — ABNORMAL LOW (ref 135–145)
Total Bilirubin: 0.7 mg/dL (ref 0.3–1.2)
Total Protein: 6.1 g/dL — ABNORMAL LOW (ref 6.5–8.1)

## 2020-02-10 LAB — I-STAT CHEM 8, ED
BUN: 30 mg/dL — ABNORMAL HIGH (ref 8–23)
Calcium, Ion: 1.04 mmol/L — ABNORMAL LOW (ref 1.15–1.40)
Chloride: 98 mmol/L (ref 98–111)
Creatinine, Ser: 1.9 mg/dL — ABNORMAL HIGH (ref 0.61–1.24)
Glucose, Bld: 104 mg/dL — ABNORMAL HIGH (ref 70–99)
HCT: 36 % — ABNORMAL LOW (ref 39.0–52.0)
Hemoglobin: 12.2 g/dL — ABNORMAL LOW (ref 13.0–17.0)
Potassium: 4.5 mmol/L (ref 3.5–5.1)
Sodium: 132 mmol/L — ABNORMAL LOW (ref 135–145)
TCO2: 24 mmol/L (ref 22–32)

## 2020-02-10 LAB — PROTIME-INR
INR: 1.7 — ABNORMAL HIGH (ref 0.8–1.2)
Prothrombin Time: 19 seconds — ABNORMAL HIGH (ref 11.4–15.2)

## 2020-02-10 LAB — ETHANOL: Alcohol, Ethyl (B): <10 mg/dL (ref <10)

## 2020-02-10 LAB — SARS CORONAVIRUS 2 BY RT PCR (HOSPITAL ORDER, PERFORMED IN ~~LOC~~ HOSPITAL LAB): SARS Coronavirus 2: POSITIVE — AB

## 2020-02-10 LAB — APTT: aPTT: 48 seconds — ABNORMAL HIGH (ref 24–36)

## 2020-02-10 MED ORDER — SODIUM CHLORIDE 0.9 % IV SOLN: INTRAVENOUS | Status: DC

## 2020-02-10 MED ORDER — CEPHALEXIN 500 MG PO CAPS: 500.0000 mg | ORAL_CAPSULE | Freq: Two times a day (BID) | ORAL | 0 refills | Status: AC

## 2020-02-10 MED ORDER — CEPHALEXIN 250 MG PO CAPS: 500.0000 mg | ORAL_CAPSULE | Freq: Once | ORAL | Status: DC

## 2020-02-10 MED ORDER — SODIUM CHLORIDE 0.9 % IV BOLUS
500.0000 mL | Freq: Once | INTRAVENOUS | Status: AC
  Administered 2020-02-10: 500 mL via INTRAVENOUS

## 2020-02-10 NOTE — Discharge Instructions (Addendum)
Your CT scan did not show any signs of new stroke With Covid, you may have several days of weakness, fevers and may develop cough, shortness of breath or vomiting / diarrhea - if these things get worse return to the ER immediately. Your blood work showed that your kidneys are dehydrated a little - this needs to be rechecked by your doctor in 1 week to make sure that they are improving You will need to drink plenty of clear liquids the next week so that you are staying hydrated Tylenol 650mg  every 6 hours for fever, Ibuprofen 400mg  every 8 hours as needed for fever You also have some urinary tract infection in the bladder - take Keflex twice daily for 7 days to treat this.  Please come back to the emergency department immediately for any severe or worsening symptoms including weakness, changes in speech or any other concerning symptom

## 2020-02-10 NOTE — ED Triage Notes (Signed)
Patient BIB [facility], for left sided weakness. Symptoms resolved before EMS arrived on scene.

## 2020-02-10 NOTE — ED Provider Notes (Signed)
[facility] EMERGENCY DEPARTMENT Provider Note   CSN: [id number] Arrival date & time: 02/10/20  1425     History Chief Complaint  Patient presents with  . Altered Mental Status    Curtis Jenkins is a 85 y.o. male.  HPI   Patient is an 85 year old male, he has a history of a prior stroke, his right occipital cerebrum, this occurred in March 2021.  He is currently on Eliquis.  He presents today after family noticed that he was having more more difficulty walking over the last couple of days, falling to the left side and today was having some slurred speech.  The paramedics noted that by the time they arrived the symptoms had essentially resolved.  This patient does not have any specific complaints for me, he denies chest pain coughing or shortness of breath.  I did discuss this with the family member, [name] who is his daughter and she states that they did a home COVID test yesterday which was positive.  He has not been making very much urine, he has been feeling more and more weak but they did notice some asymmetrical weakness when they looked at him today when he was trying to walk and falling to the left side.  Past Medical History:  Diagnosis Date  . Arthritis   . Hyperlipidemia   . Hypertension   . Sleep apnea   . Stroke [facility])    left sided weakness    Patient Active Problem List   Diagnosis Date Noted  . Lesion of bladder 11/16/2019  . PAF (paroxysmal atrial fibrillation) ([place]) 05/05/2019  . Effusion of left elbow 04/12/2019  . Chronic left shoulder pain 04/12/2019  . Rt Occipital Lobe Stroke with Lt hemiparesis 04/09/2019  . Dysphagia following cerebrovascular accident 04/09/2019  . Speech disturbance due to Acute CVA 04/09/2019  . Tobacco abuse-Chews Tobacco 04/09/2019  . Paresthesia   . Hypocalcemia 04/03/2019  . Prolonged QT interval 04/03/2019  . HTN (hypertension) 04/03/2019  . HLD (hyperlipidemia) 04/03/2019  . Weakness 04/03/2019     Past Surgical History:  Procedure Laterality Date  . BACK SURGERY    . CYSTOSCOPY W/ RETROGRADES Bilateral 08/11/2019   Procedure: CYSTOSCOPY WITH RETROGRADE PYELOGRAM;  Surgeon: Cleon Gustin, MD;  Location: [facility];  Service: Urology;  Laterality: Bilateral;  . left knee surgery    . LOOP RECORDER INSERTION N/A 04/12/2019   Procedure: LOOP RECORDER INSERTION;  Surgeon: Evans Lance, MD;  Location: [facility] CV [facility];  Service: Cardiovascular;  Laterality: N/A;  . TRANSURETHRAL RESECTION OF BLADDER TUMOR Bilateral 08/11/2019   Procedure: TRANSURETHRAL RESECTION OF BLADDER TUMOR (TURBT);  Surgeon: Cleon Gustin, MD;  Location: [facility];  Service: Urology;  Laterality: Bilateral;       No family history on file.  Social History   Tobacco Use  . Smoking status: Never Smoker  . Smokeless tobacco: Former Systems developer    Types: Chew  Substance Use Topics  . Alcohol use: Not Currently  . Drug use: Never    Home Medications Prior to Admission medications   Medication Sig Start Date End Date Taking? Authorizing Provider  cephALEXin (KEFLEX) 500 MG capsule Take 1 capsule (500 mg total) by mouth 2 (two) times daily for 7 days. 02/10/20 02/17/20 Yes Noemi Chapel, MD  amLODipine (NORVASC) 5 MG tablet Take 5 mg by mouth daily. 01/11/19   [provider]  amoxicillin-clavulanate (AUGMENTIN) 875-125 MG tablet Take 1 tablet by mouth 2 (two) times daily. 10/21/19  Carlyle Basques, MD  apixaban (ELIQUIS) 5 MG TABS tablet Take 1 tablet (5 mg total) by mouth 2 (two) times daily. 02/01/20   Evans Lance, MD  calcium-vitamin D (OSCAL WITH D) 500-200 MG-UNIT tablet Take 1 tablet by mouth 2 (two) times daily. 04/04/19   Barton Dubois, MD  clotrimazole-betamethasone (LOTRISONE) cream Apply 1 application topically 2 (two) times daily as needed (irritation/rash.).  04/29/19   [provider]  doxycycline (VIBRA-TABS) 100 MG tablet Take 1 tablet (100 mg total) by mouth 2 (two) times  daily. 10/21/19   Carlyle Basques, MD  feeding supplement, ENSURE ENLIVE, (ENSURE ENLIVE) LIQD Take 237 mLs by mouth 3 (three) times daily after meals. Patient taking differently: Take 237 mLs by mouth daily.  04/13/19   Florencia Reasons, MD  Magnesium 200 MG TABS Take 200 mg by mouth in the morning and at bedtime.     [provider]  Methylcellulose, Laxative, (FIBER THERAPY) 500 MG TABS Take 500 mg by mouth in the morning and at bedtime.    [provider]  rosuvastatin (CRESTOR) 20 MG tablet Take 20 mg by mouth every evening.  05/04/19   [provider]  traMADol (ULTRAM) 50 MG tablet Take 50 mg by mouth 3 (three) times daily as needed (pain.).  02/21/19   [provider]  zolpidem (AMBIEN) 10 MG tablet Take 10 mg by mouth at bedtime.  05/02/19   [provider]    Allergies    Patient has no known allergies.  Review of Systems   Review of Systems  All other systems reviewed and are negative.   Physical Exam Updated Vital Signs BP 109/67 (BP Location: Right Arm)   Pulse 64   Temp 98.2 F (36.8 C) (Oral)   Resp 17   SpO2 96%   Physical Exam Vitals and nursing note reviewed.  Constitutional:      General: He is not in acute distress.    Appearance: He is well-developed and well-nourished.  HENT:     Head: Normocephalic and atraumatic.     Mouth/Throat:     Mouth: Oropharynx is clear and moist.     Pharynx: No oropharyngeal exudate.  Eyes:     General: No scleral icterus.       Right eye: No discharge.        Left eye: No discharge.     Extraocular Movements: EOM normal.     Conjunctiva/sclera: Conjunctivae normal.     Pupils: Pupils are equal, round, and reactive to light.  Neck:     Thyroid: No thyromegaly.     Vascular: No JVD.  Cardiovascular:     Rate and Rhythm: Normal rate and regular rhythm.     Pulses: Intact distal pulses.     Heart sounds: Normal heart sounds. No murmur heard. No friction rub. No gallop.   Pulmonary:      Effort: Pulmonary effort is normal. No respiratory distress.     Breath sounds: Normal breath sounds. No wheezing or rales.  Abdominal:     General: Bowel sounds are normal. There is no distension.     Palpations: Abdomen is soft. There is no mass.     Tenderness: There is no abdominal tenderness.  Musculoskeletal:        General: No tenderness or edema. Normal range of motion.     Cervical back: Normal range of motion and neck supple.  Lymphadenopathy:     Cervical: No cervical adenopathy.  Skin:  General: Skin is warm and dry.     Findings: No erythema or rash.  Neurological:     Mental Status: He is alert.     Coordination: Coordination normal.     Comments: Restricted range of motion of the left upper extremity secondary to pain in the arm, grips are equal bilaterally, able to straight leg raise bilaterally.  Cranial nerves III through XII are normal, no slurred speech  Psychiatric:        Mood and Affect: Mood and affect normal.        Behavior: Behavior normal.     ED Results / Procedures / Treatments   Labs (all labs ordered are listed, but only abnormal results are displayed) Labs Reviewed  CBC WITH DIFFERENTIAL/PLATELET - Abnormal; Notable for the following components:      Result Value   WBC 3.9 (*)    RBC 3.98 (*)    Hemoglobin 12.1 (*)    HCT 37.7 (*)    Platelets 107 (*)    All other components within normal limits  COMPREHENSIVE METABOLIC PANEL - Abnormal; Notable for the following components:   Sodium 134 (*)    Glucose, Bld 111 (*)    BUN 29 (*)    Creatinine, Ser 2.06 (*)    Calcium 8.0 (*)    Total Protein 6.1 (*)    Albumin 3.2 (*)    GFR, Estimated 31 (*)    All other components within normal limits  URINALYSIS, ROUTINE W REFLEX MICROSCOPIC - Abnormal; Notable for the following components:   APPearance CLOUDY (*)    Hgb urine dipstick MODERATE (*)    Protein, ur 100 (*)    Leukocytes,Ua LARGE (*)    RBC / HPF >50 (*)    WBC, UA >50 (*)     Bacteria, UA RARE (*)    All other components within normal limits  PROTIME-INR - Abnormal; Notable for the following components:   Prothrombin Time 19.0 (*)    INR 1.7 (*)    All other components within normal limits  APTT - Abnormal; Notable for the following components:   aPTT 48 (*)    All other components within normal limits  I-STAT CHEM 8, ED - Abnormal; Notable for the following components:   Sodium 132 (*)    BUN 30 (*)    Creatinine, Ser 1.90 (*)    Glucose, Bld 104 (*)    Calcium, Ion 1.04 (*)    Hemoglobin 12.2 (*)    HCT 36.0 (*)    All other components within normal limits  SARS CORONAVIRUS 2 BY RT PCR (HOSPITAL ORDER, [facility])  ETHANOL  RAPID URINE DRUG SCREEN, HOSP PERFORMED    EKG EKG Interpretation  Date/Time:  Friday February 10 2020 14:33:36 EST Ventricular Rate:  68 PR Interval:    QRS Duration: 110 QT Interval:  460 QTC Calculation: 489 R Axis:   -29 Text Interpretation: Accelerated Junctional rhythm Nonspecific T wave abnormality Prolonged QT Abnormal ECG since last tracing no significant change Confirmed by Noemi Chapel [phone]) on 02/10/2020 5:45:26 PM   Radiology CT Head Wo Contrast  Result Date: 02/10/2020 CLINICAL DATA:  Acute neurological deficit.  LEFT-sided weakness EXAM: CT HEAD WITHOUT CONTRAST TECHNIQUE: Contiguous axial images were obtained from the base of the skull through the vertex without intravenous contrast. COMPARISON:  Head CT 05/06/2019 FINDINGS: Brain: Large region of encephalomalacia within the medial RIGHT occipital lobe. No evidence of acute cortical infarction. Extensive  subcortical and periventricular white matter hypodensities. Vascular: No hyperdense vessel or unexpected calcification. Skull: Normal. Negative for fracture or focal lesion. Sinuses/Orbits: No acute finding. Other: None. IMPRESSION: 1. No acute intracranial findings 2. Atrophy and white matter microvascular disease. 3. Remote RIGHT  occipital infarction. Electronically Signed   By: Suzy Bouchard M.D.   On: 02/10/2020 15:45    Procedures Procedures   Medications Ordered in ED Medications  cephALEXin (KEFLEX) capsule 500 mg (has no administration in time range)  sodium chloride 0.9 % bolus 500 mL (500 mLs Intravenous New Bag/Given 02/10/20 1850)    ED Course  I have reviewed the triage vital signs and the nursing notes.  Pertinent labs & imaging results that were available during my care of the patient were reviewed by me and considered in my medical decision making (see chart for details).    MDM Rules/Calculators/A&P                          This patient has had focal neurologic deficits which seem to have improved.  He does have a history of a left-sided stroke in the past thus I will discussed this with the neurologist.  The family reports that he has been gradually weaker over the last few days and tested Covid positive at home.  He is not hypoxic or tachycardic, he is not hypotensive, he is not febrile.  I have looked at the CT scan of his brain and there is no signs of hemorrhage or masses.  Lab work shows that he has an acute kidney injury likely secondary to dehydration which is likely why he has a decreased urinary output.  I will give him some IV fluids, he will need to be on Covid precautions  Care was discussed with the neurologist who thinks that this may very well have been just some recrudescence or possibly may have been a possible seizure with a postictal state which has now resolved.  She recommends that the patient be discharged home as long as there is no other acute findings on the work-up and that he can follow-up in the outpatient setting unless he continues to have these episodes in which case he should be admitted for seizure-like activity.  At this time the patient's vital signs are unremarkable.  I discussed the care with the patient's daughter, they are all in agreement including the patient  that he can go home.  He will get some IV fluids due to his urinary decreased output and mild acute kidney injury.  They want to go home, they feel they are able to care for him, I offered home health services to help because of generalized weakness and the decubitus ulcer that is starting to form on his sacrum.  This is a stage I.  UTI present on UA, keflex given, pt and family updated  The family is in agreement.  Final Clinical Impression(s) / ED Diagnoses Final diagnoses:  COVID-19  Dehydration  AKI (acute kidney injury) ([place])  Acute cystitis without hematuria    Rx / DC Orders ED Discharge Orders         [place]        02/10/20 1852    Face-to-face encounter (required for Medicare/Medicaid patients)       Comments: I Johnna Acosta certify that this patient is under my care and that I, or a nurse practitioner or physician's assistant working with me, had a  face-to-face encounter that meets the physician face-to-face encounter requirements with this patient on 02/10/2020. The encounter with the patient was in whole, or in part for the following medical condition(s) which is the primary reason for home health care (List medical condition): Weakness, pressure ulcer, stroke   02/10/20 1852    cephALEXin (KEFLEX) 500 MG capsule  2 times daily        02/10/20 1930           Noemi Chapel, MD 02/10/20 1931

## 2020-02-10 NOTE — ED Triage Notes (Signed)
Emergency Medicine Provider Triage Evaluation Note  Curtis Jenkins , a 85 y.o. male  was evaluated in triage.  Per EMS patient reported that patient was noted to be slumped over to the left side and were concerned for left sided weakness.  EMS reports that patient has had no focal neurological deficits Patient denies any complaints at present.  Patient is alert to person, place, and event.      Review of Systems  Positive: Arthralgia  Negative: Fever, chills, slurred speech, facial asymmetry, weakness, headache,   Physical Exam  There were no vitals taken for this visit. Gen:   Awake, no distress, nontoxic appearing HEENT:  Atraumatic  Resp:  Normal effort Cardiac:  Normal rate  Abd:   Nondistended, nontender MSK:   Patient complains of bilateral knee pain; reports this is his baseline due to arthritis Neuro:  Speech clear, no focal neurological deficits   Medical Decision Making  Medically screening exam initiated at 2:40 PM.  Appropriate orders placed.  Curtis Jenkins was informed that the remainder of the evaluation will be completed by another provider, this initial triage assessment does not replace that evaluation, and the importance of remaining in the ED until their evaluation is complete.  Clinical Impression  Per chart review patient had a cerebral infarct due to stenosis of right posterior cerebral artery 03/2019.  Patient has a history of hypertension, hyperlipidemia A. fib on Eliquis.  Speech clear, no focal neurological deficits; less concerning for stroke.  CBC, CMP, UA, CT head without contrast ordered and pending.     Curtis Jenkins, [place] 02/11/20 0004

## 2020-02-10 NOTE — ED Notes (Signed)
Pt and family verbalize understanding of d/c instructions, follow up, medications and isolation. Pt assisted to vehicle by EMT and family

## 2020-02-10 NOTE — ED Triage Notes (Signed)
Patient alert and oriented x4. No vision changes, difficult to assess presence of arm drift due to left shoulder pain, but patient is able to lift his left arm and hold it in place. No sensation changes.

## 2020-02-11 ENCOUNTER — Other Ambulatory Visit: Payer: Self-pay | Admitting: Physician Assistant

## 2020-02-11 ENCOUNTER — Ambulatory Visit (HOSPITAL_COMMUNITY)
Admission: RE | Admit: 2020-02-11 | Discharge: 2020-02-11 | Disposition: A | Discharge status: Home / Self Care | Payer: Medicare Other | Source: Ambulatory Visit | Attending: Pulmonary Disease | Admitting: Pulmonary Disease

## 2020-02-11 DIAGNOSIS — I480 Paroxysmal atrial fibrillation: Secondary | ICD-10-CM

## 2020-02-11 DIAGNOSIS — I63531 Cerebral infarction due to unspecified occlusion or stenosis of right posterior cerebral artery: Secondary | ICD-10-CM

## 2020-02-11 DIAGNOSIS — U071 COVID-19: Secondary | ICD-10-CM

## 2020-02-11 DIAGNOSIS — Z720 Tobacco use: Secondary | ICD-10-CM

## 2020-02-11 MED ORDER — ALBUTEROL SULFATE HFA 108 (90 BASE) MCG/ACT IN AERS: 2.0000 | INHALATION_SPRAY | Freq: Once | RESPIRATORY_TRACT | Status: DC | PRN

## 2020-02-11 MED ORDER — EPINEPHRINE 0.3 MG/0.3ML IJ SOAJ: 0.3000 mg | Freq: Once | INTRAMUSCULAR | Status: DC | PRN

## 2020-02-11 MED ORDER — DIPHENHYDRAMINE HCL 50 MG/ML IJ SOLN: 50.0000 mg | Freq: Once | INTRAMUSCULAR | Status: DC | PRN

## 2020-02-11 MED ORDER — SOTROVIMAB 500 MG/8ML IV SOLN
500.0000 mg | Freq: Once | INTRAVENOUS | Status: AC
  Administered 2020-02-11: 500 mg via INTRAVENOUS

## 2020-02-11 MED ORDER — SODIUM CHLORIDE 0.9 % IV SOLN: INTRAVENOUS | Status: DC | PRN

## 2020-02-11 MED ORDER — FAMOTIDINE IN NACL 20-0.9 MG/50ML-% IV SOLN: 20.0000 mg | Freq: Once | INTRAVENOUS | Status: DC | PRN

## 2020-02-11 MED ORDER — METHYLPREDNISOLONE SODIUM SUCC 125 MG IJ SOLR: 125.0000 mg | Freq: Once | INTRAMUSCULAR | Status: DC | PRN

## 2020-02-11 NOTE — Progress Notes (Signed)
I connected by phone with Curtis Jenkins on 02/11/2020 at 12:50 PM to discuss the potential use of a new treatment for mild to moderate COVID-19 viral infection in non-hospitalized patients.  This patient is a 85 y.o. male that meets the FDA criteria for Emergency Use Authorization of COVID monoclonal antibody sotrovimab.  Has a (+) direct SARS-CoV-2 viral test result  Has mild or moderate COVID-19   Is NOT hospitalized due to COVID-19  Is within 10 days of symptom onset  Has at least one of the high risk factor(s) for progression to severe COVID-19 and/or hospitalization as defined in EUA.  Specific high risk criteria : Older age (>/= 83 yo), BMI > 25 and Cardiovascular disease or hypertension, pt only had one vaccine   I have spoken and communicated the following to the patient or parent/caregiver regarding COVID monoclonal antibody treatment:  1. FDA has authorized the emergency use for the treatment of mild to moderate COVID-19 in adults and pediatric patients with positive results of direct SARS-CoV-2 viral testing who are 55 years of age and older weighing at least 40 kg, and who are at high risk for progressing to severe COVID-19 and/or hospitalization.  2. The significant known and potential risks and benefits of COVID monoclonal antibody, and the extent to which such potential risks and benefits are unknown.  3. Information on available alternative treatments and the risks and benefits of those alternatives, including clinical trials.  4. Patients treated with COVID monoclonal antibody should continue to self-isolate and use infection control measures (e.g., wear mask, isolate, social distance, avoid sharing personal items, clean and disinfect "high touch" surfaces, and frequent handwashing) according to CDC guidelines.   5. The patient or parent/caregiver has the option to accept or refuse COVID monoclonal antibody treatment.  After reviewing this information with the patient,  the patient has agreed to receive one of the available covid 19 monoclonal antibodies and will be provided an appropriate fact sheet prior to infusion.  Sx onset 1/24. Set up for infusion on 1/29 @ 2:30pm. Patient only had one vaccine. Directions given to [facility]. Pt is aware that insurance will be charged an infusion fee.   [name] Form 02/11/2020 12:50 PM

## 2020-02-11 NOTE — Discharge Instructions (Signed)

## 2020-02-11 NOTE — Progress Notes (Signed)
Diagnosis: COVID-19  Physician: Dr. Asencion Noble  Procedure: Covid Infusion Clinic Med: Sotrovimab infusion - Provided patient with sotrovimab fact sheet for patients, parents, and caregivers prior to infusion.   Complications: No immediate complications noted  Discharge: Discharged home  D/c instructions & fact sheet with cost information reviewed with family at car.  Family verb understanding.

## 2020-02-15 ENCOUNTER — Other Ambulatory Visit: Payer: Medicare Other | Admitting: Urology

## 2020-02-17 NOTE — Progress Notes (Signed)
[facility] Summary Report / Loop Recorder 

## 2020-03-08 LAB — CUP PACEART REMOTE DEVICE CHECK
Date Time Interrogation Session: 20220224062433
Implantable Pulse Generator Implant Date: 20210330

## 2020-03-10 ENCOUNTER — Ambulatory Visit (INDEPENDENT_AMBULATORY_CARE_PROVIDER_SITE_OTHER): Payer: Medicare Other

## 2020-03-10 DIAGNOSIS — I480 Paroxysmal atrial fibrillation: Secondary | ICD-10-CM

## 2020-03-12 DIAGNOSIS — I129 Hypertensive chronic kidney disease with stage 1 through stage 4 chronic kidney disease, or unspecified chronic kidney disease: Secondary | ICD-10-CM | POA: Diagnosis not present

## 2020-03-12 DIAGNOSIS — N184 Chronic kidney disease, stage 4 (severe): Secondary | ICD-10-CM | POA: Diagnosis not present

## 2020-03-12 DIAGNOSIS — E1122 Type 2 diabetes mellitus with diabetic chronic kidney disease: Secondary | ICD-10-CM | POA: Diagnosis not present

## 2020-03-26 NOTE — Progress Notes (Signed)
[facility] Summary Report / Loop Recorder 

## 2020-04-09 ENCOUNTER — Other Ambulatory Visit: Payer: Medicare Other | Admitting: Urology

## 2020-04-11 ENCOUNTER — Telehealth: Payer: Self-pay

## 2020-04-11 LAB — CUP PACEART REMOTE DEVICE CHECK
Date Time Interrogation Session: 20220328230338
Implantable Pulse Generator Implant Date: 20210330

## 2020-04-11 NOTE — Telephone Encounter (Signed)
Patients daughter needing to speak with nurse regarding patient having severe itching. Hospice said to check with his kidney doctor because his kidneys may not be functioning properly.  Please call daughter (care taker) at:  [phone]  Thanks, Helene Kelp

## 2020-04-12 ENCOUNTER — Other Ambulatory Visit: Payer: Self-pay

## 2020-04-12 ENCOUNTER — Ambulatory Visit (INDEPENDENT_AMBULATORY_CARE_PROVIDER_SITE_OTHER): Payer: Medicare Other

## 2020-04-12 DIAGNOSIS — I63531 Cerebral infarction due to unspecified occlusion or stenosis of right posterior cerebral artery: Secondary | ICD-10-CM

## 2020-04-17 NOTE — Telephone Encounter (Signed)
He would need to recheck a BMP and see his nephrologist

## 2020-04-25 NOTE — Progress Notes (Signed)
[facility] Summary Report / Loop Recorder 

## 2020-05-12 DIAGNOSIS — E1122 Type 2 diabetes mellitus with diabetic chronic kidney disease: Secondary | ICD-10-CM | POA: Diagnosis not present

## 2020-05-12 DIAGNOSIS — I129 Hypertensive chronic kidney disease with stage 1 through stage 4 chronic kidney disease, or unspecified chronic kidney disease: Secondary | ICD-10-CM | POA: Diagnosis not present

## 2020-05-12 DIAGNOSIS — N184 Chronic kidney disease, stage 4 (severe): Secondary | ICD-10-CM | POA: Diagnosis not present

## 2020-05-15 ENCOUNTER — Ambulatory Visit (INDEPENDENT_AMBULATORY_CARE_PROVIDER_SITE_OTHER): Payer: Medicare Other

## 2020-05-15 DIAGNOSIS — I63531 Cerebral infarction due to unspecified occlusion or stenosis of right posterior cerebral artery: Secondary | ICD-10-CM

## 2020-05-15 DIAGNOSIS — Z1389 Encounter for screening for other disorder: Secondary | ICD-10-CM | POA: Diagnosis not present

## 2020-05-15 DIAGNOSIS — R7309 Other abnormal glucose: Secondary | ICD-10-CM | POA: Diagnosis not present

## 2020-05-15 DIAGNOSIS — M1711 Unilateral primary osteoarthritis, right knee: Secondary | ICD-10-CM | POA: Diagnosis not present

## 2020-05-15 DIAGNOSIS — Z0000 Encounter for general adult medical examination without abnormal findings: Secondary | ICD-10-CM | POA: Diagnosis not present

## 2020-05-15 DIAGNOSIS — H6121 Impacted cerumen, right ear: Secondary | ICD-10-CM | POA: Diagnosis not present

## 2020-05-15 DIAGNOSIS — E7849 Other hyperlipidemia: Secondary | ICD-10-CM | POA: Diagnosis not present

## 2020-05-16 LAB — CUP PACEART REMOTE DEVICE CHECK
Date Time Interrogation Session: 20220430230239
Implantable Pulse Generator Implant Date: 20210330

## 2020-05-30 ENCOUNTER — Other Ambulatory Visit: Payer: Medicare Other | Admitting: Urology

## 2020-06-05 NOTE — Progress Notes (Signed)
[facility] Summary Report / Loop Recorder 

## 2020-06-07 DIAGNOSIS — N184 Chronic kidney disease, stage 4 (severe): Secondary | ICD-10-CM | POA: Diagnosis not present

## 2020-06-07 DIAGNOSIS — I69354 Hemiplegia and hemiparesis following cerebral infarction affecting left non-dominant side: Secondary | ICD-10-CM | POA: Diagnosis not present

## 2020-06-07 DIAGNOSIS — Z515 Encounter for palliative care: Secondary | ICD-10-CM | POA: Diagnosis not present

## 2020-06-07 DIAGNOSIS — I63531 Cerebral infarction due to unspecified occlusion or stenosis of right posterior cerebral artery: Secondary | ICD-10-CM | POA: Diagnosis not present

## 2020-06-17 LAB — CUP PACEART REMOTE DEVICE CHECK
Date Time Interrogation Session: 20220602230613
Implantable Pulse Generator Implant Date: 20210330

## 2020-06-18 ENCOUNTER — Ambulatory Visit (INDEPENDENT_AMBULATORY_CARE_PROVIDER_SITE_OTHER): Payer: Medicare Other

## 2020-06-18 DIAGNOSIS — I63531 Cerebral infarction due to unspecified occlusion or stenosis of right posterior cerebral artery: Secondary | ICD-10-CM | POA: Diagnosis not present

## 2020-07-10 NOTE — Progress Notes (Signed)
[facility] Summary Report / Loop Recorder 

## 2020-07-12 DIAGNOSIS — I69354 Hemiplegia and hemiparesis following cerebral infarction affecting left non-dominant side: Secondary | ICD-10-CM | POA: Diagnosis not present

## 2020-07-12 DIAGNOSIS — Z515 Encounter for palliative care: Secondary | ICD-10-CM | POA: Diagnosis not present

## 2020-07-12 DIAGNOSIS — N184 Chronic kidney disease, stage 4 (severe): Secondary | ICD-10-CM | POA: Diagnosis not present

## 2020-07-12 DIAGNOSIS — I63531 Cerebral infarction due to unspecified occlusion or stenosis of right posterior cerebral artery: Secondary | ICD-10-CM | POA: Diagnosis not present

## 2020-07-22 LAB — CUP PACEART REMOTE DEVICE CHECK
Date Time Interrogation Session: 20220705230742
Implantable Pulse Generator Implant Date: 20210330

## 2020-07-23 ENCOUNTER — Ambulatory Visit (INDEPENDENT_AMBULATORY_CARE_PROVIDER_SITE_OTHER): Payer: Medicare Other

## 2020-07-23 DIAGNOSIS — I63531 Cerebral infarction due to unspecified occlusion or stenosis of right posterior cerebral artery: Secondary | ICD-10-CM

## 2020-08-01 ENCOUNTER — Ambulatory Visit: Payer: Medicare Other | Admitting: Urology

## 2020-08-01 ENCOUNTER — Other Ambulatory Visit: Payer: Self-pay

## 2020-08-01 ENCOUNTER — Encounter: Payer: Self-pay | Admitting: Urology

## 2020-08-01 VITALS — BP 146/68 | HR 66

## 2020-08-01 DIAGNOSIS — N329 Bladder disorder, unspecified: Secondary | ICD-10-CM | POA: Diagnosis not present

## 2020-08-01 DIAGNOSIS — N3000 Acute cystitis without hematuria: Secondary | ICD-10-CM

## 2020-08-01 LAB — URINALYSIS, ROUTINE W REFLEX MICROSCOPIC
Bilirubin, UA: NEGATIVE
Glucose, UA: NEGATIVE
Ketones, UA: NEGATIVE
Nitrite, UA: NEGATIVE
Specific Gravity, UA: 1.025 (ref 1.005–1.030)
Urobilinogen, Ur: 0.2 mg/dL (ref 0.2–1.0)
pH, UA: 6 (ref 5.0–7.5)

## 2020-08-01 LAB — MICROSCOPIC EXAMINATION
Renal Epithel, UA: NONE SEEN /hpf
WBC, UA: >30 /hpf — AB (ref 0–5)

## 2020-08-01 MED ORDER — CIPROFLOXACIN HCL 500 MG PO TABS
500.0000 mg | ORAL_TABLET | Freq: Once | ORAL | Status: DC
Start: 2020-08-01 — End: 2020-08-01

## 2020-08-01 MED ORDER — NITROFURANTOIN MONOHYD MACRO 100 MG PO CAPS: 100.0000 mg | ORAL_CAPSULE | Freq: Two times a day (BID) | ORAL | 0 refills | Status: DC

## 2020-08-01 NOTE — Patient Instructions (Signed)
Urinary Tract Infection, Adult °A urinary tract infection (UTI) is an infection of any part of the urinary tract. The urinary tract includes: °The kidneys. °The ureters. °The bladder. °The urethra. °These organs make, store, and get rid of pee (urine) in the body. °What are the causes? °This infection is caused by germs (bacteria) in your genital area. These germs grow and cause swelling (inflammation) of your urinary tract. °What increases the risk? °The following factors may make you more likely to develop this condition: °Using a small, thin tube (catheter) to drain pee. °Not being able to control when you pee or poop (incontinence). °Being male. If you are male, these things can increase the risk: °Using these methods to prevent pregnancy: °A medicine that kills sperm (spermicide). °A device that blocks sperm (diaphragm). °Having low levels of a male hormone (estrogen). °Being pregnant. °You are more likely to develop this condition if: °You have genes that add to your risk. °You are sexually active. °You take antibiotic medicines. °You have trouble peeing because of: °A prostate that is bigger than normal, if you are male. °A blockage in the part of your body that drains pee from the bladder. °A kidney stone. °A nerve condition that affects your bladder. °Not getting enough to drink. °Not peeing often enough. °You have other conditions, such as: °Diabetes. °A weak disease-fighting system (immune system). °Sickle cell disease. °Gout. °Injury of the spine. °What are the signs or symptoms? °Symptoms of this condition include: °Needing to pee right away. °Peeing small amounts often. °Pain or burning when peeing. °Blood in the pee. °Pee that smells bad or not like normal. °Trouble peeing. °Pee that is cloudy. °Fluid coming from the vagina, if you are male. °Pain in the belly or lower back. °Other symptoms include: °Vomiting. °Not feeling hungry. °Feeling mixed up (confused). This may be the first symptom in  older adults. °Being tired and grouchy (irritable). °A fever. °Watery poop (diarrhea). °How is this treated? °Taking antibiotic medicine. °Taking other medicines. °Drinking enough water. °In some cases, you may need to see a specialist. °Follow these instructions at home: °Medicines °Take over-the-counter and prescription medicines only as told by your doctor. °If you were prescribed an antibiotic medicine, take it as told by your doctor. Do not stop taking it even if you start to feel better. °General instructions °Make sure you: °Pee until your bladder is empty. °Do not hold pee for a long time. °Empty your bladder after sex. °Wipe from front to back after peeing or pooping if you are a male. Use each tissue one time when you wipe. °Drink enough fluid to keep your pee pale yellow. °Keep all follow-up visits. °Contact a doctor if: °You do not get better after 1-2 days. °Your symptoms go away and then come back. °Get help right away if: °You have very bad back pain. °You have very bad pain in your lower belly. °You have a fever. °You have chills. °You feeling like you will vomit or you vomit. °Summary °A urinary tract infection (UTI) is an infection of any part of the urinary tract. °This condition is caused by germs in your genital area. °There are many risk factors for a UTI. °Treatment includes antibiotic medicines. °Drink enough fluid to keep your pee pale yellow. °This information is not intended to replace advice given to you by your health care provider. Make sure you discuss any questions you have with your health care provider. °Document Revised: 08/12/2019 Document Reviewed: 08/12/2019 °Elsevier Patient Education © 2022   Elsevier Inc. ° °

## 2020-08-01 NOTE — Progress Notes (Signed)
08/01/2020 3:45 PM   Curtis Jenkins [birth year] [id number]  Referring provider: [facility], [place],  [place] [address]  Chief Complaint  Patient presents with   Procedure    Cysto    HPI: [name] is a 85yo here for cystoscopy. For the past 5-6 days he has noted worsening urinary urgency, frequency, dysuria and pelvic pain. UA is concerning for infection. No hematuria. No other associated symptoms.    PMH: Past Medical History:  Diagnosis Date   Arthritis    Hyperlipidemia    Hypertension    Sleep apnea    Stroke ([place])    left sided weakness    Surgical History: Past Surgical History:  Procedure Laterality Date   BACK SURGERY     CYSTOSCOPY W/ RETROGRADES Bilateral 08/11/2019   Procedure: CYSTOSCOPY WITH RETROGRADE PYELOGRAM;  Surgeon: Cleon Gustin, MD;  Location: [facility];  Service: Urology;  Laterality: Bilateral;   left knee surgery     LOOP RECORDER INSERTION N/A 04/12/2019   Procedure: LOOP RECORDER INSERTION;  Surgeon: Evans Lance, MD;  Location: [facility];  Service: Cardiovascular;  Laterality: N/A;   TRANSURETHRAL RESECTION OF BLADDER TUMOR Bilateral 08/11/2019   Procedure: TRANSURETHRAL RESECTION OF BLADDER TUMOR (TURBT);  Surgeon: Cleon Gustin, MD;  Location: [facility];  Service: Urology;  Laterality: Bilateral;    Home Medications:  Allergies as of 08/01/2020   No Known Allergies      Medication List        Accurate as of August 01, 2020  3:45 PM. If you have any questions, ask your nurse or doctor.          amLODipine 5 MG tablet Commonly known as: NORVASC Take 5 mg by mouth daily.   amoxicillin-clavulanate 875-125 MG tablet Commonly known as: Augmentin Take 1 tablet by mouth 2 (two) times daily.   apixaban 5 MG Tabs tablet Commonly known as: Eliquis Take 1 tablet (5 mg total) by mouth 2 (two) times daily.   calcium-vitamin D 500-200 MG-UNIT tablet Commonly known as: OSCAL WITH D Take 1  tablet by mouth 2 (two) times daily.   clotrimazole-betamethasone cream Commonly known as: LOTRISONE Apply 1 application topically 2 (two) times daily as needed (irritation/rash.).   doxycycline 100 MG tablet Commonly known as: VIBRA-TABS Take 1 tablet (100 mg total) by mouth 2 (two) times daily.   feeding supplement Liqd Take 237 mLs by mouth 3 (three) times daily after meals. What changed: when to take this   Fiber Therapy 500 MG Tabs Generic drug: Methylcellulose (Laxative) Take 500 mg by mouth in the morning and at bedtime.   Magnesium 200 MG Tabs Take 200 mg by mouth in the morning and at bedtime.   nitrofurantoin (macrocrystal-monohydrate) 100 MG capsule Commonly known as: MACROBID Take 1 capsule (100 mg total) by mouth every 12 (twelve) hours. Started by: Nicolette Bang, MD   rosuvastatin 20 MG tablet Commonly known as: CRESTOR Take 20 mg by mouth every evening.   traMADol 50 MG tablet Commonly known as: ULTRAM Take 50 mg by mouth 3 (three) times daily as needed (pain.).   zolpidem 10 MG tablet Commonly known as: AMBIEN Take 10 mg by mouth at bedtime.        Allergies: No Known Allergies  Family History: History reviewed. No pertinent family history.  Social History:  reports that he has never smoked. He has quit using smokeless tobacco.  His smokeless tobacco use included chew. He  reports previous alcohol use. He reports that he does not use drugs.  ROS: All other review of systems were reviewed and are negative except what is noted above in HPI  Physical Exam: BP (!) 146/68   Pulse 66   Constitutional:  Alert and oriented, No acute distress. HEENT: [place] AT, moist mucus membranes.  Trachea midline, no masses. Cardiovascular: No clubbing, cyanosis, or edema. Respiratory: Normal respiratory effort, no increased work of breathing. GI: Abdomen is soft, nontender, nondistended, no abdominal masses GU: No CVA tenderness.  Lymph: No cervical or inguinal  lymphadenopathy. Skin: No rashes, bruises or suspicious lesions. Neurologic: Grossly intact, no focal deficits, moving all 4 extremities. Psychiatric: Normal mood and affect.  Laboratory Data: Lab Results  Component Value Date   WBC 3.9 (L) 02/10/2020   HGB 12.2 (L) 02/10/2020   HCT 36.0 (L) 02/10/2020   MCV 94.7 02/10/2020   PLT 107 (L) 02/10/2020    Lab Results  Component Value Date   CREATININE 1.90 (H) 02/10/2020    Lab Results  Component Value Date   PSA 0.5 05/20/2019    No results found for: TESTOSTERONE  Lab Results  Component Value Date   HGBA1C 6.3 (H) 04/03/2019    Urinalysis    Component Value Date/Time   COLORURINE YELLOW 02/10/2020 1830   APPEARANCEUR CLOUDY (A) 02/10/2020 1830   LABSPEC 1.018 02/10/2020 1830   PHURINE 5.0 02/10/2020 1830   GLUCOSEU NEGATIVE 02/10/2020 1830   HGBUR MODERATE (A) 02/10/2020 1830   BILIRUBINUR NEGATIVE 02/10/2020 1830   BILIRUBINUR negative 05/20/2019 1137   KETONESUR NEGATIVE 02/10/2020 1830   PROTEINUR 100 (A) 02/10/2020 1830   UROBILINOGEN 0.2 05/20/2019 1137   UROBILINOGEN 0.2 04/11/2010 1329   NITRITE NEGATIVE 02/10/2020 1830   LEUKOCYTESUR LARGE (A) 02/10/2020 1830    Lab Results  Component Value Date   BACTERIA RARE (A) 02/10/2020    Pertinent Imaging:  Results for orders placed during the hospital encounter of 04/09/19  DG Abd 1 View  Narrative CLINICAL DATA:  NG tube placement  EXAM: ABDOMEN - 1 VIEW  COMPARISON:  April 09, 2019  FINDINGS: The NG tube again projects over the left upper quadrant with the side hole near the GE junction. The bowel gas pattern is nonobstructive and nonspecific. There are degenerative changes of the lumbar spine.  IMPRESSION: NG tube is unchanged in positioning. If possible, further advancement into the stomach is recommended.   Electronically Signed By: Constance Holster M.D. On: 04/09/2019 19:11  No results found for this or any previous  visit.  No results found for this or any previous visit.  No results found for this or any previous visit.  Results for orders placed during the hospital encounter of 11/15/09  US Renal  Narrative Clinical Data: History of renal insufficiency.  RENAL/URINARY TRACT ULTRASOUND COMPLETE  Comparison:  None.  Findings:  Right Kidney:  Right renal length is 9.4 cm.  No right renal mass is evident.  Left Kidney:  Left renal length is 9.0 cm.  A hypoechoic mass in the upper pole of the left kidney has a a few internal echoes.  No internal color Doppler flow was seen.  No wall thickening or solid nodularity or papillary excrescence is evident.  The area measures 2.8 x 2.4 x 2.3 cm.  Examination of both kidneys shows no evidence of solid mass, hydronephrosis, calculus, or parenchymal loss.  There is increased echogenicity of parenchyma both kidneys.  This may indicate medical renal disease.  Bladder:  No intrinsic bladder lesion is identified involving the mucosa.  Ureteral jets are seen.  There is slight thickening of the bladder wall.  This may reflect hypertrophy.  There is enlargement of the prostate with indentation on bladder base by median lobe of the prostate.  Prostate measured 7 x 5.7 x 4.9 cm.  There is increased echogenicity of liver parenchyma.  No focal lesion is seen.  IMPRESSION: Normal sized kidneys with no hydronephrosis.  Increased echogenicity of parenchyma of kidneys may reflect medical renal disease.  A hypoechoic mass in the upper pole of the left kidney has a a few internal echoes.  No internal color Doppler flow was seen.  No wall thickening or solid nodularity or papillary excrescence is evident. The area measures 2.8 x 2.4 x 2.3 cm.  There may be slight posterior acoustic enhancement but this is not definite.  The mass is  indeterminate at this time.  It is not a simple cyst.  This may reflect a cyst with some debris within it.  Cystic  neoplastic process cannot be excluded.  The mass  may be further characterized by MRI.  Follow-up ultrasound in 3 - 6 months may be obtained to evaluate for stability of size.  Enlargement of prostate with indentation of bladder base by median lobe of the prostate.  Bladder wall thickening may reflect hypertrophy.  Provider: Andres Ege  No results found for this or any previous visit.  No results found for this or any previous visit.  Results for orders placed during the hospital encounter of 05/06/19  CT Renal Stone Study  Narrative CLINICAL DATA:  Hematuria.  EXAM: CT ABDOMEN AND PELVIS WITHOUT CONTRAST  TECHNIQUE: Multidetector CT imaging of the abdomen and pelvis was performed following the standard protocol without IV contrast.  COMPARISON:  Remote renal ultrasound 11/15/2009  FINDINGS: Lower chest: Mild elevation of left hemidiaphragm with adjacent atelectasis/scarring. Small calcifications in the posterior right costophrenic sulcus may be calcified granuloma.  Hepatobiliary: No evidence of focal hepatic abnormality on noncontrast exam. Layering hyperdensity in the gallbladder. No pericholecystic inflammation. No biliary dilatation.  Pancreas: Parenchymal atrophy. No ductal dilatation or inflammation.  Spleen: Normal in size without focal abnormality.  Adrenals/Urinary Tract: Normal adrenal glands. No renal calculi. There is bilateral renal parenchymal thinning. Complex cyst versus adjacent clustered cysts in the posterior mid left kidney measure 3.2 x 2.9 cm. No hydronephrosis. No significant perinephric edema. No obvious solid mass on noncontrast exam. With ureters are decompressed without stones along the course.  Circumferential bladder wall thickening. There is focal nodular thickening of the posterior right urinary trigone, series 2, image 67. Mass effect on the bladder base from enlarged prostate gland. Generalized perivesicular  stranding.  Stomach/Bowel: Nondistended stomach. No small bowel obstruction or inflammation. Normal appendix. Moderate stool burden throughout the colon. There is colonic tortuosity particularly involving the sigmoid. Mild sigmoid diverticulosis without diverticulitis. No colonic wall thickening or inflammation.  Vascular/Lymphatic: Aorto bi-iliac atherosclerosis. No aortic aneurysm. There are small periportal and retroperitoneal lymph nodes are not enlarged by size criteria, nonspecific.  Reproductive: Mildly enlarged prostate gland causes mass effect on the bladder base. Surgical clips within the prostate posteriorly.  Other: Mild presacral edema. No ascites. No free air or focal fluid collection. No significant body wall hernia.  Musculoskeletal: Advanced degenerative disc disease and facet hypertrophy in the lumbar spine. 8 mm anterolisthesis of L3 on L4 with chronic L3 bilateral pars interarticularis defects. No evidence of focal bone lesion.  IMPRESSION: 1. Circumferential bladder  wall thickening with perivesicular stranding, cystitis versus chronic in the setting of enlarged prostate gland. 2. Focal nodular thickening of the posterior right urinary trigone. Recommend further evaluation with cystoscopy. 3. No renal stones or obstructive uropathy. Bilateral renal parenchymal thinning. Complex cyst versus adjacent clustered cysts in the posterior mid left kidney measuring 3.2 x 2.9 cm. A 2.8 cm hypoechoic lesion with seen on remote renal ultrasound 11/15/2009. Lack of significant change in size over the course of 10 years favors a benign etiology, however this is technically indeterminate on noncontrast CT. 4. Mildly enlarged prostate gland causes mass effect on the bladder base. 5. Advanced degenerative disc disease and facet hypertrophy in the lumbar spine. 8 mm anterolisthesis of L3 on L4 with chronic L3 bilateral pars interarticularis defects. 6. Layering hyperdensity  in the gallbladder may represent sludge or stones. No pericholecystic inflammation.  Aortic Atherosclerosis (ICD10-I70.0).   Electronically Signed By: Keith Rake M.D. On: 05/06/2019 17:21   Assessment & Plan:    1. Lesion of bladder -RTC 1 week for cystoscopy - Urinalysis, Routine w reflex microscopic - ciprofloxacin (CIPRO) tablet 500 mg  2. Acute cystitis without hematuria -Urine for culture -macrobid 100mg  BID for 7 days - Urine Culture   No follow-ups on file.  Nicolette Bang, MD  [facility] [place]

## 2020-08-01 NOTE — Progress Notes (Signed)

## 2020-08-03 LAB — URINE CULTURE

## 2020-08-10 ENCOUNTER — Other Ambulatory Visit: Payer: Self-pay

## 2020-08-10 ENCOUNTER — Ambulatory Visit: Payer: Medicare Other | Admitting: Urology

## 2020-08-10 VITALS — BP 138/76 | HR 82 | Temp 100.0°F

## 2020-08-10 DIAGNOSIS — N329 Bladder disorder, unspecified: Secondary | ICD-10-CM

## 2020-08-10 MED ORDER — CIPROFLOXACIN HCL 500 MG PO TABS
500.0000 mg | ORAL_TABLET | Freq: Once | ORAL | Status: AC
  Administered 2020-08-10: 500 mg via ORAL

## 2020-08-10 NOTE — Progress Notes (Signed)

## 2020-08-10 NOTE — Progress Notes (Signed)
   08/10/20  CC: followup bladder lesion   HPI: Mr Fecteau is a 85yo here for surveillance of a bladder lesion Blood pressure 138/76, pulse 82, temperature 100 F (37.8 C). NED. A&Ox3.   No respiratory distress   Abd soft, NT, ND Normal phallus with bilateral descended testicles  Cystoscopy Procedure Note  Patient identification was confirmed, informed consent was obtained, and patient was prepped using Betadine solution.  Lidocaine jelly was administered per urethral meatus.     Pre-Procedure: - Inspection reveals a normal caliber ureteral meatus.  Procedure: The flexible cystoscope was introduced without difficulty - No urethral strictures/lesions are present. - Enlarged prostate  - Normal bladder neck - Bilateral ureteral orifices identified - 1cm left lateral wall lesion - No bladder stones - No trabeculation     Post-Procedure: - Patient tolerated the procedure well  Assessment/ Plan: We will scheduled for bladder tumor resection. Risks/benefits/alternatives discussed  No follow-ups on file.  Nicolette Bang, MD

## 2020-08-12 ENCOUNTER — Encounter: Payer: Self-pay | Admitting: Urology

## 2020-08-12 NOTE — Patient Instructions (Signed)
Transurethral Resection of Bladder Tumor  Transurethral resection of a bladder tumor is the removal (resection) of a cancerous growth (tumor) on the inside wall of the bladder. The bladder is the organ that holds urine. The tumor is removed through the tube that carries urine out of the body (urethra). In a transurethral resection, a thin telescope with a light, a tiny camera, and an electric cutting edge (resectoscope) is passed through the urethra. In men, the opening of the urethra is at theend of the penis. In women, it is just above the opening of the vagina. Tell a health care provider about: Any allergies you have. All medicines you are taking, including vitamins, herbs, eye drops, creams, and over-the-counter medicines. Any problems you or family members have had with anesthetic medicines. Any blood disorders you have. Any surgeries you have had. Any medical conditions you have. Any recent urinary tract infections you have had. Whether you are pregnant or may be pregnant. What are the risks? Generally, this is a safe procedure. However, problems may occur, including: Infection. Bleeding. Allergic reactions to medicines. Damage to nearby structures or organs, such as: The urethra. The tubes that drain urine from the kidneys into the bladder (ureters). Pain and burning during urination. Difficulty urinating due to partial blockage of the urethra. Inability to urinate (urinary retention). What happens before the procedure? Staying hydrated Follow instructions from your health care provider about hydration, which may include: Up to 2 hours before the procedure - you may continue to drink clear liquids, such as water, clear fruit juice, black coffee, and plain tea.  Eating and drinking restrictions Follow instructions from your health care provider about eating and drinking, which may include: 8 hours before the procedure - stop eating heavy meals or foods, such as meat, fried  foods, or fatty foods. 6 hours before the procedure - stop eating light meals or foods, such as toast or cereal. 6 hours before the procedure - stop drinking milk or drinks that contain milk. 2 hours before the procedure - stop drinking clear liquids. Medicines Ask your health care provider about: Changing or stopping your regular medicines. This is especially important if you are taking diabetes medicines or blood thinners. Taking medicines such as aspirin and ibuprofen. These medicines can thin your blood. Do not take these medicines unless your health care provider tells you to take them. Taking over-the-counter medicines, vitamins, herbs, and supplements. Tests You may have exams or tests, including: Physical exam. Blood tests. Urine tests. Electrocardiogram (ECG). This test measures the electrical activity of the heart. General instructions Plan to have someone take you home from the hospital or clinic. Ask your health care provider how your surgical site will be marked or identified. Ask your health care provider what steps will be taken to help prevent infection. These may include: Washing skin with a germ-killing soap. Taking antibiotic medicine. What happens during the procedure? An IV will be inserted into one of your veins. You will be given one or more of the following: A medicine to help you relax (sedative). A medicine to make you fall asleep (general anesthetic). A medicine that is injected into your spine to numb the area below and slightly above the injection site (spinal anesthetic). Your legs will be placed in foot rests (stirrups) so that your legs are apart and your knees are bent. The resectoscope will be passed through your urethra and into your bladder. The part of your bladder that is affected by the tumor will be  resected using the cutting edge of the resectoscope. The resectoscope will be removed. A thin, flexible tube (catheter) will be passed through your  urethra and into your bladder. The catheter will drain urine into a bag outside of your body. Fluid may be passed through the catheter to keep the catheter open. The procedure may vary among health care providers and hospitals. What happens after the procedure? Your blood pressure, heart rate, breathing rate, and blood oxygen level will be monitored until you leave the hospital or clinic. You may continue to receive fluids and medicines through an IV. You will have some pain. You will be given pain medicine to relieve pain. You will have a catheter to drain your urine. You will have blood in your urine. Your catheter may be kept in until your urine is clear. The amount of urine will be monitored. If necessary, your bladder may be rinsed out (irrigated) by passing fluid through your catheter. You will be encouraged to walk around as soon as possible. You may have to wear compression stockings. These stockings help to prevent blood clots and reduce swelling in your legs. Do not drive for 24 hours if you were given a sedative during your procedure. Summary Transurethral resection of a bladder tumor is the removal (resection) of a cancerous growth (tumor) on the inside wall of the bladder. To do this procedure, your health care provider uses a thin telescope with a light, a tiny camera, and an electric cutting edge (resectoscope). Follow your health care provider's instructions. You may need to stop or change certain medicines, and you may be told to stop eating and drinking several hours before the procedure. Your blood pressure, heart rate, breathing rate, and blood oxygen level will be monitored until you leave the hospital or clinic. You may have to wear compression stockings. These stockings help to prevent blood clots and reduce swelling in your legs. This information is not intended to replace advice given to you by your health care provider. Make sure you discuss any questions you have with  your healthcare provider. Document Revised: 07/31/2017 Document Reviewed: 07/31/2017 Elsevier Patient Education  [place].

## 2020-08-16 NOTE — Progress Notes (Signed)
[facility] Summary Report / Loop Recorder 

## 2020-08-23 DIAGNOSIS — N184 Chronic kidney disease, stage 4 (severe): Secondary | ICD-10-CM | POA: Diagnosis not present

## 2020-08-23 DIAGNOSIS — I69354 Hemiplegia and hemiparesis following cerebral infarction affecting left non-dominant side: Secondary | ICD-10-CM | POA: Diagnosis not present

## 2020-08-23 DIAGNOSIS — I63531 Cerebral infarction due to unspecified occlusion or stenosis of right posterior cerebral artery: Secondary | ICD-10-CM | POA: Diagnosis not present

## 2020-08-23 DIAGNOSIS — Z515 Encounter for palliative care: Secondary | ICD-10-CM | POA: Diagnosis not present

## 2020-08-27 ENCOUNTER — Ambulatory Visit (INDEPENDENT_AMBULATORY_CARE_PROVIDER_SITE_OTHER): Payer: Medicare Other

## 2020-08-27 DIAGNOSIS — I63531 Cerebral infarction due to unspecified occlusion or stenosis of right posterior cerebral artery: Secondary | ICD-10-CM

## 2020-08-29 LAB — CUP PACEART REMOTE DEVICE CHECK
Date Time Interrogation Session: 20220816072004
Implantable Pulse Generator Implant Date: 20210330

## 2020-09-04 ENCOUNTER — Telehealth: Payer: Self-pay | Admitting: Internal Medicine

## 2020-09-04 NOTE — Telephone Encounter (Signed)
   Patient Name: Curtis Jenkins  DOB: 08-Jan-1936 MRN: [id number]  Primary Cardiologist: Dr. Lovena Le (EP)  Chart reviewed as part of pre-operative protocol coverage. Last OV 10/2019. Will route to pharm for input on anticoagulation then patient will need call so that we can also provide anticoag instructions in one phone conversation since procedure is coming up early next week.   Charlie Pitter, PA-C 09/04/2020, 4:52 PM

## 2020-09-04 NOTE — Telephone Encounter (Signed)
   [facility] Pre-operative Risk Assessment    Patient Name: Curtis Jenkins  DOB: Apr 17, 1935 MRN: [id number]  HEARTCARE STAFF:  - IMPORTANT!!!!!! Under Visit Info/Reason for Call, type in Other and utilize the format Clearance MM/DD/YY or Clearance TBD. Do not use dashes or single digits. - Please review there is not already an duplicate clearance open for this procedure. - If request is for dental extraction, please clarify the # of teeth to be extracted. - If the patient is currently at the dentist's office, call Pre-Op Callback Staff (MA/nurse) to input urgent request.  - If the patient is not currently in the dentist office, please route to the Pre-Op pool.  Request for surgical clearance:  What type of surgery is being performed? Transuretheral Resecetion Bladder Tumor   When is this surgery scheduled? 09/10/20  What type of clearance is required (medical clearance vs. Pharmacy clearance to hold med vs. Both)? both  Are there any medications that need to be held prior to surgery and how long? Eliquis - hold for 2 days prior to procedure   Practice name and name of physician performing surgery? Dr Nicolette Bang   What is the office phone number? [phone]   7.   What is the office fax number? [phone]  8.   Anesthesia type (None, local, MAC, general) ? General    Jannet Askew 09/04/2020, 4:00 PM  _________________________________________________________________   (provider comments below)

## 2020-09-04 NOTE — Progress Notes (Signed)
Eliqius hold request sent to cardiologist.

## 2020-09-04 NOTE — Patient Instructions (Signed)
Your procedure is scheduled on: 09/10/2020  Report to [facility] at    6:30 AM.  Call this number if you have problems the morning of surgery: [phone]   Remember:   Do not Eat or Drink after midnight         No Smoking the morning of surgery  :  Take these medicines the morning of surgery with A SIP OF WATER: Amlodipine and tramadol              Follow instructions from office about holding Eliquis   Do not wear jewelry, make-up or nail polish.  Do not wear lotions, powders, or perfumes. You may wear deodorant.  Do not shave 48 hours prior to surgery. Men may shave face and neck.  Do not bring valuables to the hospital.  Contacts, dentures or bridgework may not be worn into surgery.  Leave suitcase in the car. After surgery it may be brought to your room.  For patients admitted to the hospital, checkout time is 11:00 AM the day of discharge.   Patients discharged the day of surgery will not be allowed to drive home.    Special Instructions: Shower using CHG night before surgery and shower the day of surgery use CHG.  Use special wash - you have one bottle of CHG for all showers.  You should use approximately 1/2 of the bottle for each shower.  How to Use Chlorhexidine for Bathing Chlorhexidine gluconate (CHG) is a germ-killing (antiseptic) solution that is used to clean the skin. It can get rid of the bacteria that normally live on the skin and can keep them away for about 24 hours. To clean your skin with CHG, you may be given: A CHG solution to use in the shower or as part of a sponge bath. A prepackaged cloth that contains CHG. Cleaning your skin with CHG may help lower the risk for infection: While you are staying in the intensive care unit of the hospital. If you have a vascular access, such as a central line, to provide short-term or long-term access to your veins. If you have a catheter to drain urine from your bladder. If you are on a ventilator. A  ventilator is a machine that helps you breathe by moving air in and out of your lungs. After surgery. What are the risks? Risks of using CHG include: A skin reaction. Hearing loss, if CHG gets in your ears. Eye injury, if CHG gets in your eyes and is not rinsed out. The CHG product catching fire. Make sure that you avoid smoking and flames after applying CHG to your skin. Do not use CHG: If you have a chlorhexidine allergy or have previously reacted to chlorhexidine. On babies younger than 59 months of age. How to use CHG solution Use CHG only as told by your health care provider, and follow the instructions on the label. Use the full amount of CHG as directed. Usually, this is one bottle. During a shower Follow these steps when using CHG solution during a shower (unless your health care provider gives you different instructions): Start the shower. Use your normal soap and shampoo to wash your face and hair. Turn off the shower or move out of the shower stream. Pour the CHG onto a clean washcloth. Do not use any type of brush or rough-edged sponge. Starting at your neck, lather your body down to your toes. Make sure you follow these instructions: If you will be having surgery,  pay special attention to the part of your body where you will be having surgery. Scrub this area for at least 1 minute. Do not use CHG on your head or face. If the solution gets into your ears or eyes, rinse them well with water. Avoid your genital area. Avoid any areas of skin that have broken skin, cuts, or scrapes. Scrub your back and under your arms. Make sure to wash skin folds. Let the lather sit on your skin for 1-2 minutes or as long as told by your health care provider. Thoroughly rinse your entire body in the shower. Make sure that all body creases and crevices are rinsed well. Dry off with a clean towel. Do not put any substances on your body afterward--such as powder, lotion, or perfume--unless you are  told to do so by your health care provider. Only use lotions that are recommended by the manufacturer. Put on clean clothes or pajamas. If it is the night before your surgery, sleep in clean sheets.  During a sponge bath Follow these steps when using CHG solution during a sponge bath (unless your health care provider gives you different instructions): Use your normal soap and shampoo to wash your face and hair. Pour the CHG onto a clean washcloth. Starting at your neck, lather your body down to your toes. Make sure you follow these instructions: If you will be having surgery, pay special attention to the part of your body where you will be having surgery. Scrub this area for at least 1 minute. Do not use CHG on your head or face. If the solution gets into your ears or eyes, rinse them well with water. Avoid your genital area. Avoid any areas of skin that have broken skin, cuts, or scrapes. Scrub your back and under your arms. Make sure to wash skin folds. Let the lather sit on your skin for 1-2 minutes or as long as told by your health care provider. Using a different clean, wet washcloth, thoroughly rinse your entire body. Make sure that all body creases and crevices are rinsed well. Dry off with a clean towel. Do not put any substances on your body afterward--such as powder, lotion, or perfume--unless you are told to do so by your health care provider. Only use lotions that are recommended by the manufacturer. Put on clean clothes or pajamas. If it is the night before your surgery, sleep in clean sheets. How to use CHG prepackaged cloths Only use CHG cloths as told by your health care provider, and follow the instructions on the label. Use the CHG cloth on clean, dry skin. Do not use the CHG cloth on your head or face unless your health care provider tells you to. When washing with the CHG cloth: Avoid your genital area. Avoid any areas of skin that have broken skin, cuts, or  scrapes. Before surgery Follow these steps when using a CHG cloth to clean before surgery (unless your health care provider gives you different instructions): Using the CHG cloth, vigorously scrub the part of your body where you will be having surgery. Scrub using a back-and-forth motion for 3 minutes. The area on your body should be completely wet with CHG when you are done scrubbing. Do not rinse. Discard the cloth and let the area air-dry. Do not put any substances on the area afterward, such as powder, lotion, or perfume. Put on clean clothes or pajamas. If it is the night before your surgery, sleep in clean sheets.  For general  bathing Follow these steps when using CHG cloths for general bathing (unless your health care provider gives you different instructions). Use a separate CHG cloth for each area of your body. Make sure you wash between any folds of skin and between your fingers and toes. Wash your body in the following order, switching to a new cloth after each step: The front of your neck, shoulders, and chest. Both of your arms, under your arms, and your hands. Your stomach and groin area, avoiding the genitals. Your right leg and foot. Your left leg and foot. The back of your neck, your back, and your buttocks. Do not rinse. Discard the cloth and let the area air-dry. Do not put any substances on your body afterward--such as powder, lotion, or perfume--unless you are told to do so by your health care provider. Only use lotions that are recommended by the manufacturer. Put on clean clothes or pajamas. Contact a health care provider if: Your skin gets irritated after scrubbing. You have questions about using your solution or cloth. Get help right away if: Your eyes become very red or swollen. Your eyes itch badly. Your skin itches badly and is red or swollen. Your hearing changes. You have trouble seeing. You have swelling or tingling in your mouth or throat. You have trouble  breathing. You swallow any chlorhexidine. Summary Chlorhexidine gluconate (CHG) is a germ-killing (antiseptic) solution that is used to clean the skin. Cleaning your skin with CHG may help to lower your risk for infection. You may be given CHG to use for bathing. It may be in a bottle or in a prepackaged cloth to use on your skin. Carefully follow your health care provider's instructions and the instructions on the product label. Do not use CHG if you have a chlorhexidine allergy. Contact your health care provider if your skin gets irritated after scrubbing. This information is not intended to replace advice given to you by your health care provider. Make sure you discuss any questions you have with your healthcare provider. Document Revised: 05/13/2019 Document Reviewed: 06/17/2019 Elsevier Patient Education  2022 [place]. Transurethral Resection of Bladder Tumor, Care After This sheet gives you information about how to care for yourself after your procedure. Your health care provider may also give you more specific instructions. If you have problems or questions, contact your health careprovider. What can I expect after the procedure? After the procedure, it is common to have: A small amount of blood in your urine for up to 2 weeks. Soreness or mild pain from your catheter. After your catheter is removed, you may have mild soreness, especially when urinating. Pain in your lower abdomen. Follow these instructions at home: Medicines  Take over-the-counter and prescription medicines only as told by your health care provider. If you were prescribed an antibiotic medicine, take it as told by your health care provider. Do not stop taking the antibiotic even if you start to feel better. Do not drive for 24 hours if you were given a sedative during your procedure. Ask your health care provider if the medicine prescribed to you: Requires you to avoid driving or using heavy machinery. Can  cause constipation. You may need to take these actions to prevent or treat constipation: Take over-the-counter or prescription medicines. Eat foods that are high in fiber, such as beans, whole grains, and fresh fruits and vegetables. Limit foods that are high in fat and processed sugars, such as fried or sweet foods.  Activity Return to your normal  activities as told by your health care provider. Ask your health care provider what activities are safe for you. Do not lift anything that is heavier than 10 lb (4.5 kg), or the limit that you are told, until your health care provider says that it is safe. Avoid intense physical activity for as long as told by your health care provider. Rest as told by your health care provider. Avoid sitting for a long time without moving. Get up to take short walks every 1-2 hours. This is important to improve blood flow and breathing. Ask for help if you feel weak or unsteady. General instructions  Do not drink alcohol for as long as told by your health care provider. This is especially important if you are taking prescription pain medicines. Do not take baths, swim, or use a hot tub until your health care provider approves. Ask your health care provider if you may take showers. You may only be allowed to take sponge baths. If you have a catheter, follow instructions from your health care provider about caring for your catheter and your drainage bag. Drink enough fluid to keep your urine pale yellow. Wear compression stockings as told by your health care provider. These stockings help to prevent blood clots and reduce swelling in your legs. Keep all follow-up visits as told by your health care provider. This is important. You will need to be followed closely with regular checks of your bladder and urethra (cystoscopies) to make sure that the cancer does not come back.  Contact a health care provider if: You have pain that gets worse or does not improve with  medicine. You have blood in your urine for more than 2 weeks. You have cloudy or bad-smelling urine. You become constipated. Signs of constipation may include having: Fewer than three bowel movements in a week. Difficulty having a bowel movement. Stools that are dry, hard, or larger than normal. You have a fever. Get help right away if: You have: Severe pain. Bright red blood in your urine. Blood clots in your urine. A lot of blood in your urine. Your catheter has been removed and you are not able to urinate. You have a catheter in place and the catheter is not draining urine. Summary After your procedure, it is common to have a small amount of blood in your urine, soreness or mild pain from your catheter, and pain in your lower abdomen. Take over-the-counter and prescription medicines only as told by your health care provider. Rest as told by your health care provider. Follow your health care provider's instructions about returning to normal activities. Ask what activities are safe for you. If you have a catheter, follow instructions from your health care provider about caring for your catheter and your drainage bag. Get help right away if you cannot urinate, you have severe pain, or you have bright red blood or blood clots in your urine. This information is not intended to replace advice given to you by your health care provider. Make sure you discuss any questions you have with your healthcare provider. Document Revised: 07/30/2017 Document Reviewed: 07/30/2017 Elsevier Patient Education  [place] Anesthesia, Adult, Care After This sheet gives you information about how to care for yourself after your procedure. Your health care provider may also give you more specific instructions. If you have problems or questions, contact your health careprovider. What can I expect after the procedure? After the procedure, the following side effects are common: Pain or discomfort  at the IV site. Nausea. Vomiting. Sore throat. Trouble concentrating. Feeling cold or chills. Feeling weak or tired. Sleepiness and fatigue. Soreness and body aches. These side effects can affect parts of the body that were not involved in surgery. Follow these instructions at home: For the time period you were told by your health care provider:  Rest. Do not participate in activities where you could fall or become injured. Do not drive or use machinery. Do not drink alcohol. Do not take sleeping pills or medicines that cause drowsiness. Do not make important decisions or sign legal documents. Do not take care of children on your own.  Eating and drinking Follow any instructions from your health care provider about eating or drinking restrictions. When you feel hungry, start by eating small amounts of foods that are soft and easy to digest (bland), such as toast. Gradually return to your regular diet. Drink enough fluid to keep your urine pale yellow. If you vomit, rehydrate by drinking water, juice, or clear broth. General instructions If you have sleep apnea, surgery and certain medicines can increase your risk for breathing problems. Follow instructions from your health care provider about wearing your sleep device: Anytime you are sleeping, including during daytime naps. While taking prescription pain medicines, sleeping medicines, or medicines that make you drowsy. Have a responsible adult stay with you for the time you are told. It is important to have someone help care for you until you are awake and alert. Return to your normal activities as told by your health care provider. Ask your health care provider what activities are safe for you. Take over-the-counter and prescription medicines only as told by your health care provider. If you smoke, do not smoke without supervision. Keep all follow-up visits as told by your health care provider. This is important. Contact a health  care provider if: You have nausea or vomiting that does not get better with medicine. You cannot eat or drink without vomiting. You have pain that does not get better with medicine. You are unable to pass urine. You develop a skin rash. You have a fever. You have redness around your IV site that gets worse. Get help right away if: You have difficulty breathing. You have chest pain. You have blood in your urine or stool, or you vomit blood. Summary After the procedure, it is common to have a sore throat or nausea. It is also common to feel tired. Have a responsible adult stay with you for the time you are told. It is important to have someone help care for you until you are awake and alert. When you feel hungry, start by eating small amounts of foods that are soft and easy to digest (bland), such as toast. Gradually return to your regular diet. Drink enough fluid to keep your urine pale yellow. Return to your normal activities as told by your health care provider. Ask your health care provider what activities are safe for you. This information is not intended to replace advice given to you by your health care provider. Make sure you discuss any questions you have with your healthcare provider. Document Revised: 09/15/2019 Document Reviewed: 04/14/2019 Elsevier Patient Education  2022 [employer].

## 2020-09-05 NOTE — Telephone Encounter (Signed)
Patient with diagnosis of atrial fibrillation on Eliquis for anticoagulation.    Procedure: transurethral resection bladder tumor Date of procedure: 09/10/20   CHA2DS2-VASc Score = 5  This indicates a 7.2% annual risk of stroke. The patient's score is based upon: CHF History: No HTN History: Yes Diabetes History: No Stroke History: Yes Vascular Disease History: No Age Score: 2 Gender Score: 0   CrCl 35 (with adjusted body weight) Platelet count 107  Chart indicates stroke was R occipital lobe stroke with left hemiparesis.   March 2021  Surgeon asking for 2 day hold of Eliquis prior to procedure.  Would recommend this okay to proceed without bridge, but will defer to primary cardiologist.    t

## 2020-09-06 ENCOUNTER — Encounter (HOSPITAL_COMMUNITY): Payer: Self-pay

## 2020-09-06 ENCOUNTER — Other Ambulatory Visit: Payer: Self-pay

## 2020-09-06 ENCOUNTER — Encounter (HOSPITAL_COMMUNITY)
Admission: RE | Admit: 2020-09-06 | Discharge: 2020-09-06 | Disposition: A | Discharge status: Home / Self Care | Payer: Medicare Other | Source: Ambulatory Visit | Attending: Urology | Admitting: Urology

## 2020-09-06 DIAGNOSIS — Z01812 Encounter for preprocedural laboratory examination: Secondary | ICD-10-CM | POA: Diagnosis not present

## 2020-09-06 HISTORY — DX: Cardiac arrhythmia, unspecified: I49.9

## 2020-09-06 HISTORY — DX: Chronic kidney disease, unspecified: N18.9

## 2020-09-06 LAB — BASIC METABOLIC PANEL
Anion gap: 5 (ref 5–15)
BUN: 22 mg/dL (ref 8–23)
CO2: 28 mmol/L (ref 22–32)
Calcium: 7.8 mg/dL — ABNORMAL LOW (ref 8.9–10.3)
Chloride: 106 mmol/L (ref 98–111)
Creatinine, Ser: 1.86 mg/dL — ABNORMAL HIGH (ref 0.61–1.24)
GFR, Estimated: 35 mL/min — ABNORMAL LOW (ref >60)
Glucose, Bld: 111 mg/dL — ABNORMAL HIGH (ref 70–99)
Potassium: 4 mmol/L (ref 3.5–5.1)
Sodium: 139 mmol/L (ref 135–145)

## 2020-09-06 NOTE — Telephone Encounter (Signed)
Patient may proceed with surgery. Low risk.

## 2020-09-06 NOTE — Telephone Encounter (Signed)
If no answer at the other number for Day surgery please call [phone]    They need to know today if possible about patient holding medication

## 2020-09-07 NOTE — Telephone Encounter (Signed)
    Patient Name: Curtis Jenkins  DOB: [birth year] MRN: [id number]  Primary Cardiologist: Dr. Lovena Le, MD   Chart reviewed as part of pre-operative protocol coverage. Given past medical history and time since last visit, based on ACC/AHA guidelines, Curtis Jenkins would be at acceptable risk for the planned procedure without further cardiovascular testing.   Patient with diagnosis of atrial fibrillation on Eliquis for anticoagulation.     Procedure: transurethral resection bladder tumor Date of procedure: 09/10/20   CHA2DS2-VASc Score = 5  This indicates a 7.2% annual risk of stroke. The patient's score is based upon: CHF History: No HTN History: Yes Diabetes History: No Stroke History: Yes Vascular Disease History: No Age Score: 2 Gender Score: 0    CrCl 35 (with adjusted body weight) Platelet count 107  Chart indicates stroke was R occipital lobe stroke with left hemiparesis 03/2019   Surgeon asking for 2 day hold of Eliquis prior to procedure.  Would recommend this is okay to proceed without bridge, but final decision deferred to primary cardiologist. Case reviewed with Dr. Lovena Le who agrees with the above holding recommendations and therefore can proceed.   The patient was advised that if he develops new symptoms prior to surgery to contact our office to arrange for a follow-up visit, and he verbalized understanding.  I will route this recommendation to the requesting party via Epic fax function and remove from pre-op pool.  Please call with questions.  Kathyrn Drown, NP 09/07/2020, 7:42 AM

## 2020-09-07 NOTE — Progress Notes (Signed)
RE: Does Mr Aloisi need to hold his eliquis Received: Today McKenzie, Candee Furbish, MD sent to Wilmer Floor, RN He can stay on eliquis        Previous Messages   ----- Message -----  From: Wilmer Floor, RN  Sent: 09/04/2020   2:09 PM EDT  To: Cleon Gustin, MD, Dorisann Frames, RN  Subject: Does Mr Mcbain need to hold his eliquis         Ileene Musa,   I see a note under special needs that he can stay on his asprin.  I did not see any notes about his Eliquis.  He is for TUR BT 09/10/2020   Thanks,   Rosalyn Gess RN

## 2020-09-10 ENCOUNTER — Other Ambulatory Visit: Payer: Self-pay

## 2020-09-10 ENCOUNTER — Ambulatory Visit (HOSPITAL_COMMUNITY): Payer: Medicare Other | Admitting: Anesthesiology

## 2020-09-10 ENCOUNTER — Encounter (HOSPITAL_COMMUNITY)
Admission: RE | Disposition: A | Discharge status: Home / Self Care | Payer: Self-pay | Source: Home / Self Care | Attending: Urology

## 2020-09-10 ENCOUNTER — Ambulatory Visit (HOSPITAL_COMMUNITY)
Admission: RE | Admit: 2020-09-10 | Discharge: 2020-09-10 | Disposition: A | Discharge status: Home / Self Care | Payer: Medicare Other | Attending: Urology | Admitting: Urology

## 2020-09-10 ENCOUNTER — Encounter (HOSPITAL_COMMUNITY): Payer: Self-pay | Admitting: Urology

## 2020-09-10 DIAGNOSIS — Z9889 Other specified postprocedural states: Secondary | ICD-10-CM | POA: Diagnosis not present

## 2020-09-10 DIAGNOSIS — D494 Neoplasm of unspecified behavior of bladder: Secondary | ICD-10-CM | POA: Diagnosis present

## 2020-09-10 DIAGNOSIS — Z87891 Personal history of nicotine dependence: Secondary | ICD-10-CM | POA: Diagnosis not present

## 2020-09-10 DIAGNOSIS — Z79899 Other long term (current) drug therapy: Secondary | ICD-10-CM | POA: Insufficient documentation

## 2020-09-10 DIAGNOSIS — N3020 Other chronic cystitis without hematuria: Secondary | ICD-10-CM | POA: Diagnosis not present

## 2020-09-10 DIAGNOSIS — N3021 Other chronic cystitis with hematuria: Secondary | ICD-10-CM | POA: Diagnosis not present

## 2020-09-10 DIAGNOSIS — N3289 Other specified disorders of bladder: Secondary | ICD-10-CM | POA: Insufficient documentation

## 2020-09-10 DIAGNOSIS — D414 Neoplasm of uncertain behavior of bladder: Secondary | ICD-10-CM | POA: Diagnosis not present

## 2020-09-10 DIAGNOSIS — R9431 Abnormal electrocardiogram [ECG] [EKG]: Secondary | ICD-10-CM | POA: Diagnosis not present

## 2020-09-10 HISTORY — PX: CYSTOSCOPY: SHX5120

## 2020-09-10 HISTORY — PX: TRANSURETHRAL RESECTION OF BLADDER TUMOR: SHX2575

## 2020-09-10 SURGERY — CYSTOSCOPY
Anesthesia: General

## 2020-09-10 MED ORDER — TRAMADOL HCL 50 MG PO TABS: 50.0000 mg | ORAL_TABLET | Freq: Three times a day (TID) | ORAL | 0 refills | Status: AC | PRN

## 2020-09-10 MED ORDER — ONDANSETRON HCL 4 MG/2ML IJ SOLN
INTRAMUSCULAR | Status: AC
  Filled 2020-09-10: qty 2

## 2020-09-10 MED ORDER — LACTATED RINGERS IV SOLN: INTRAVENOUS | Status: DC

## 2020-09-10 MED ORDER — PROPOFOL 10 MG/ML IV BOLUS
INTRAVENOUS | Status: AC
  Filled 2020-09-10: qty 40

## 2020-09-10 MED ORDER — ROCURONIUM 10MG/ML (10ML) SYRINGE FOR MEDFUSION PUMP - OPTIME
INTRAVENOUS | Status: DC | PRN
  Administered 2020-09-10: 30 mg via INTRAVENOUS

## 2020-09-10 MED ORDER — ORAL CARE MOUTH RINSE: 15.0000 mL | Freq: Once | OROMUCOSAL | Status: AC

## 2020-09-10 MED ORDER — FENTANYL CITRATE (PF) 250 MCG/5ML IJ SOLN
INTRAMUSCULAR | Status: AC
  Filled 2020-09-10: qty 5

## 2020-09-10 MED ORDER — CEFAZOLIN SODIUM-DEXTROSE 2-4 GM/100ML-% IV SOLN
2.0000 g | INTRAVENOUS | Status: AC
  Administered 2020-09-10: 2 g via INTRAVENOUS
  Filled 2020-09-10: qty 100

## 2020-09-10 MED ORDER — SODIUM CHLORIDE 0.9 % IR SOLN
Status: DC | PRN
  Administered 2020-09-10 (×2): 3000 mL via INTRAVESICAL

## 2020-09-10 MED ORDER — SUGAMMADEX SODIUM 200 MG/2ML IV SOLN
INTRAVENOUS | Status: DC | PRN
Start: 2020-09-10 — End: 2020-09-10
  Administered 2020-09-10: 300 mg via INTRAVENOUS

## 2020-09-10 MED ORDER — FENTANYL CITRATE (PF) 100 MCG/2ML IJ SOLN: 25.0000 ug | INTRAMUSCULAR | Status: DC | PRN

## 2020-09-10 MED ORDER — ONDANSETRON HCL 4 MG/2ML IJ SOLN
INTRAMUSCULAR | Status: DC | PRN
Start: 2020-09-10 — End: 2020-09-10
  Administered 2020-09-10: 4 mg via INTRAVENOUS

## 2020-09-10 MED ORDER — STERILE WATER FOR IRRIGATION IR SOLN
Status: DC | PRN
  Administered 2020-09-10: 1000 mL

## 2020-09-10 MED ORDER — CHLORHEXIDINE GLUCONATE 0.12 % MT SOLN
15.0000 mL | Freq: Once | OROMUCOSAL | Status: AC
  Administered 2020-09-10: 15 mL via OROMUCOSAL
  Filled 2020-09-10: qty 15

## 2020-09-10 MED ORDER — LIDOCAINE HCL (CARDIAC) PF 50 MG/5ML IV SOSY
PREFILLED_SYRINGE | INTRAVENOUS | Status: DC | PRN
  Administered 2020-09-10: 80 mg via INTRAVENOUS

## 2020-09-10 MED ORDER — FENTANYL CITRATE (PF) 100 MCG/2ML IJ SOLN
INTRAMUSCULAR | Status: DC | PRN
  Administered 2020-09-10: 50 ug via INTRAVENOUS

## 2020-09-10 MED ORDER — PROPOFOL 10 MG/ML IV BOLUS
INTRAVENOUS | Status: DC | PRN
  Administered 2020-09-10: 110 mg via INTRAVENOUS

## 2020-09-10 MED ORDER — LIDOCAINE HCL (PF) 2 % IJ SOLN
INTRAMUSCULAR | Status: AC
  Filled 2020-09-10: qty 5

## 2020-09-10 SURGICAL SUPPLY — 28 items
BAG DRAIN URO TABLE W/ADPT NS (BAG) ×2 IMPLANT
BAG DRN 8 ADPR NS SKTRN CSTL (BAG) ×1
BAG DRN RND TRDRP ANRFLXCHMBR (UROLOGICAL SUPPLIES) ×1
BAG HAMPER (MISCELLANEOUS) ×2 IMPLANT
BAG URINE DRAIN 2000ML AR STRL (UROLOGICAL SUPPLIES) ×2 IMPLANT
CATH FOLEY 3WAY 30CC 22F (CATHETERS) IMPLANT
CATH FOLEY LATEX FREE 22FR (CATHETERS)
CATH FOLEY LF 22FR (CATHETERS) IMPLANT
CLOTH BEACON ORANGE TIMEOUT ST (SAFETY) ×2 IMPLANT
ELECT LOOP 22F BIPOLAR SML (ELECTROSURGICAL) ×2
ELECTRODE LOOP 22F BIPOLAR SML (ELECTROSURGICAL) ×1 IMPLANT
GLOVE SURG POLYISO LF SZ8 (GLOVE) ×2 IMPLANT
GLOVE SURG UNDER POLY LF SZ7 (GLOVE) ×4 IMPLANT
GOWN STRL REUS W/TWL LRG LVL3 (GOWN DISPOSABLE) ×4 IMPLANT
GOWN STRL REUS W/TWL XL LVL3 (GOWN DISPOSABLE) ×2 IMPLANT
IV NS IRRIG 3000ML ARTHROMATIC (IV SOLUTION) ×3 IMPLANT
KIT TURNOVER CYSTO (KITS) ×2 IMPLANT
MANIFOLD NEPTUNE II (INSTRUMENTS) ×2 IMPLANT
PACK CYSTO (CUSTOM PROCEDURE TRAY) ×2 IMPLANT
PAD ARMBOARD 7.5X6 YLW CONV (MISCELLANEOUS) ×2 IMPLANT
PLUG CATH AND CAP STER (CATHETERS) ×2 IMPLANT
SYR 30ML LL (SYRINGE) ×2 IMPLANT
SYR TOOMEY IRRIG 70ML (MISCELLANEOUS) ×2
SYRINGE TOOMEY IRRIG 70ML (MISCELLANEOUS) ×1 IMPLANT
TOWEL NATURAL 4PK STERILE (DISPOSABLE) ×2 IMPLANT
TOWEL OR 17X26 4PK STRL BLUE (TOWEL DISPOSABLE) ×2 IMPLANT
WATER STERILE IRR 3000ML UROMA (IV SOLUTION) ×2 IMPLANT
WATER STERILE IRR 500ML POUR (IV SOLUTION) ×2 IMPLANT

## 2020-09-10 NOTE — H&P (Signed)
Urology Admission H&P  Chief Complaint: bladder lesion  History of Present Illness: Curtis Jenkins is a 85yo here for bladder tumor resection. He was found to have a 1cm bladder tumor on office cystoscopy. No significant LUTS.   Past Medical History:  Diagnosis Date   Arthritis    Atrial fibrillation ([place]) 03/2019   Chronic kidney disease    Dysrhythmia    Hyperlipidemia    Hypertension    Sleep apnea    Stroke ([place]) 03/2019   left sided weakness   Past Surgical History:  Procedure Laterality Date   BACK SURGERY     CYSTOSCOPY W/ RETROGRADES Bilateral 08/11/2019   Procedure: CYSTOSCOPY WITH RETROGRADE PYELOGRAM;  Surgeon: Cleon Gustin, MD;  Location: [facility];  Service: Urology;  Laterality: Bilateral;   left knee surgery     LOOP RECORDER INSERTION N/A 04/12/2019   Procedure: LOOP RECORDER INSERTION;  Surgeon: Evans Lance, MD;  Location: [facility];  Service: Cardiovascular;  Laterality: N/A;   TRANSURETHRAL RESECTION OF BLADDER TUMOR Bilateral 08/11/2019   Procedure: TRANSURETHRAL RESECTION OF BLADDER TUMOR (TURBT);  Surgeon: Cleon Gustin, MD;  Location: [facility];  Service: Urology;  Laterality: Bilateral;    Home Medications:  Current Facility-Administered Medications  Medication Dose Route Frequency Provider Last Rate Last Admin   ceFAZolin (ANCEF) IVPB 2g/100 mL premix  2 g Intravenous 30 min Pre-Op Reedy Biernat, Candee Furbish, MD       lactated ringers infusion   Intravenous Continuous Denese Killings, MD 50 mL/hr at 09/10/20 0736 Continued from Pre-op at 09/10/20 0736   Allergies: No Known Allergies  History reviewed. No pertinent family history. Social History:  reports that he has never smoked. He has quit using smokeless tobacco.  His smokeless tobacco use included chew. He reports that he does not currently use alcohol. He reports that he does not use drugs.  Review of Systems  All other systems reviewed and are negative.  Physical Exam:  Vital signs  in last 24 hours: Temp:  [98.5 F (36.9 C)] 98.5 F (36.9 C) (08/29 0703) Pulse Rate:  [62] 62 (08/29 0703) Resp:  [16] 16 (08/29 0703) BP: (135)/(72) 135/72 (08/29 0703) SpO2:  [97 %] 97 % (08/29 0703) Weight:  [108.9 kg] 108.9 kg (08/29 0703) Physical Exam Vitals reviewed.  Constitutional:      Appearance: Normal appearance.  HENT:     Head: Normocephalic and atraumatic.     Nose: Nose normal. No congestion.  Eyes:     Extraocular Movements: Extraocular movements intact.     Pupils: Pupils are equal, round, and reactive to light.  Cardiovascular:     Rate and Rhythm: Normal rate and regular rhythm.  Pulmonary:     Effort: Pulmonary effort is normal. No respiratory distress.  Abdominal:     General: Abdomen is flat. There is no distension.  Musculoskeletal:        General: Normal range of motion.     Cervical back: Normal range of motion. No rigidity.  Skin:    General: Skin is warm and dry.  Neurological:     General: No focal deficit present.     Mental Status: He is alert and oriented to person, place, and time.  Psychiatric:        Mood and Affect: Mood normal.        Behavior: Behavior normal.        Thought Content: Thought content normal.        Judgment:  Judgment normal.    Laboratory Data:  No results found for this or any previous visit (from the past 24 hour(s)). No results found for this or any previous visit (from the past 240 hour(s)). Creatinine: Recent Labs    09/06/20 1428  CREATININE 1.86*   Baseline Creatinine: 1.8  Impression/Assessment:  85yo with a bladder tumor   Plan:  The risks/benefits/alternatives to bladder tumor resection was explained to the patient and he understands and wishes to proceed with surgery  Nicolette Bang 09/10/2020, 7:38 AM

## 2020-09-10 NOTE — Op Note (Signed)
.  Preoperative diagnosis: bladder tumor  Postoperative diagnosis: Same  Procedure: 1 cystoscopy 2. Transurethral resection of bladder tumor, small  Attending: Rosie Fate  Anesthesia: General  Estimated blood loss: Minimal  Drains: 22 French foley  Specimens: bladder tumor  Antibiotics: ancef  Findings: 1cm papillary left lateral wall tumor.  Ureteral orifices in normal anatomic location.   Indications: Patient is a 85 year old male with a history of bladder tumor and gross hematuria.  After discussing treatment options, they decided proceed with transurethral resection of a bladder tumor.  Procedure in detail: The patient was brought to the operating room and a brief timeout was done to ensure correct patient, correct procedure, correct site.  General anesthesia was administered patient was placed in dorsal lithotomy position.  Their genitalia was then prepped and draped in usual sterile fashion.  A rigid 74 French cystoscope was passed in the urethra and the bladder.  Bladder was inspected and we noted a 1cm bladder tumor.  the ureteral orifices were in the normal orthotopic locations.  Using the bipolar resectoscope we removed the bladder tumor down to the base. A subsequent muscle deep biopsy was then taken. Hemostasis was then obtained with electrocautery. We then removed the bladder tumor chips and sent them for pathology. We then re-inspected the bladder and found no residula bleeding.  the bladder was then drained, a 22 French foley was placed and this concluded the procedure which was well tolerated by patient.  Complications: None  Condition: Stable, extubated, transferred to PACU  Plan: Patient is to be discharged home and followup in 5 days for foley catheter removal and pathology discussion.

## 2020-09-10 NOTE — Anesthesia Procedure Notes (Addendum)
Procedure Name: Intubation Date/Time: 09/10/2020 8:15 AM Performed by: Ollen Bowl, CRNA Pre-anesthesia Checklist: Patient identified, Patient being monitored, Timeout performed, Emergency Drugs available and Suction available Patient Re-evaluated:Patient Re-evaluated prior to induction Oxygen Delivery Method: Circle system utilized Preoxygenation: Pre-oxygenation with 100% oxygen Induction Type: IV induction Ventilation: Mask ventilation without difficulty Laryngoscope Size: Mac and 3 Grade View: Grade I Tube type: Oral Tube size: 7.0 mm Number of attempts: 1 Airway Equipment and Method: Stylet Placement Confirmation: ETT inserted through vocal cords under direct vision, positive ETCO2 and breath sounds checked- equal and bilateral Secured at: 23 cm Tube secured with: Tape Dental Injury: Teeth and Oropharynx as per pre-operative assessment

## 2020-09-10 NOTE — Anesthesia Postprocedure Evaluation (Signed)
Anesthesia Post Note  Patient: Curtis Jenkins  Procedure(s) Performed: CYSTOSCOPY TRANSURETHRAL RESECTION OF BLADDER TUMOR (TURBT)  Patient location during evaluation: PACU Anesthesia Type: General Level of consciousness: awake and alert and oriented Pain management: pain level controlled Vital Signs Assessment: post-procedure vital signs reviewed and stable Respiratory status: spontaneous breathing and respiratory function stable Cardiovascular status: blood pressure returned to baseline and stable Postop Assessment: no apparent nausea or vomiting Anesthetic complications: no   No notable events documented.   Last Vitals:  Vitals:   09/10/20 0915 09/10/20 0930  BP: 117/90 130/60  Pulse: 62 61  Resp: 20 18  Temp:  36.4 C  SpO2: 98% 97%    Last Pain:  Vitals:   09/10/20 0930  TempSrc: Oral  PainSc: 0-No pain                 Samiya Mervin C Noris Kulinski

## 2020-09-10 NOTE — Anesthesia Preprocedure Evaluation (Addendum)
Anesthesia Evaluation  Patient identified by MRN, date of birth, ID band Patient awake    Reviewed: Allergy & Precautions, NPO status , Patient's Chart, lab work & pertinent test results  History of Anesthesia Complications Negative for: history of anesthetic complications  Airway Mallampati: II  TM Distance: >3 FB Neck ROM: Full    Dental  (+) Edentulous Upper, Edentulous Lower   Pulmonary sleep apnea and Continuous Positive Airway Pressure Ventilation ,    Pulmonary exam normal breath sounds clear to auscultation       Cardiovascular Exercise Tolerance: Poor hypertension, Pt. on medications + dysrhythmias (prolonged QT) Atrial Fibrillation  Rhythm:Irregular Rate:Bradycardia  11-Aug-2019 13:36:11 [facility]300 ROUTINE RECORD Atrial fibrillation Left axis deviation Prolonged QT Abnormal ECG   Neuro/Psych CVA, Residual Symptoms    GI/Hepatic negative GI ROS, Neg liver ROS,   Endo/Other    Renal/GU Renal InsufficiencyRenal disease     Musculoskeletal  (+) Arthritis  (back sx),   Abdominal   Peds  Hematology   Anesthesia Other Findings 1. Left ventricular ejection fraction, by estimation, is 40 to 45%. The left ventricle has mildly decreased function. The left ventricle demonstrates global hypokinesis. The left ventricular internal cavity size  was mildly dilated. Left ventricular  diastolic parameters are consistent with Grade I diastolic dysfunction (impaired relaxation).  2. Right ventricular systolic function is normal. The right ventricular size is normal.  3. The mitral valve is grossly normal. Trivial mitral valve regurgitation. No evidence of mitral stenosis.  4. The aortic valve is normal in structure. Aortic valve regurgitation is not visualized. No aortic stenosis is present  Reproductive/Obstetrics                             Anesthesia  Physical  Anesthesia Plan  ASA: 4  Anesthesia Plan: General   Post-op Pain Management:    Induction: Intravenous  PONV Risk Score and Plan: Dexamethasone and Ondansetron  Airway Management Planned: Oral ETT  Additional Equipment:   Intra-op Plan:   Post-operative Plan: Extubation in OR  Informed Consent:   Plan Discussed with: CRNA and Surgeon  Anesthesia Plan Comments:         Anesthesia Quick Evaluation

## 2020-09-10 NOTE — Transfer of Care (Signed)
Immediate Anesthesia Transfer of Care Note  Patient: Curtis Jenkins  Procedure(s) Performed: CYSTOSCOPY TRANSURETHRAL RESECTION OF BLADDER TUMOR (TURBT)  Patient Location: Short Stay  Anesthesia Type:General  Level of Consciousness: awake  Airway & Oxygen Therapy: Patient Spontanous Breathing  Post-op Assessment: Report given to RN  Post vital signs: Reviewed  Last Vitals:  Vitals Value Taken Time  BP 127/62 09/10/20 0900  Temp 36.2 C 09/10/20 0900  Pulse 64 09/10/20 0902  Resp 14 09/10/20 0902  SpO2 98 % 09/10/20 0902  Vitals shown include unvalidated device data.  Last Pain:  Vitals:   09/10/20 0703  TempSrc: Oral  PainSc: 0-No pain      Patients Stated Pain Goal: 5 (00/51/10 2111)  Complications: No notable events documented.

## 2020-09-11 ENCOUNTER — Encounter (HOSPITAL_COMMUNITY): Payer: Self-pay | Admitting: Urology

## 2020-09-11 DIAGNOSIS — M1991 Primary osteoarthritis, unspecified site: Secondary | ICD-10-CM | POA: Diagnosis not present

## 2020-09-11 DIAGNOSIS — G894 Chronic pain syndrome: Secondary | ICD-10-CM | POA: Diagnosis not present

## 2020-09-11 LAB — SURGICAL PATHOLOGY

## 2020-09-13 ENCOUNTER — Encounter (HOSPITAL_COMMUNITY): Payer: Self-pay | Admitting: Urology

## 2020-09-14 NOTE — Progress Notes (Signed)
[facility] Summary Report / Loop Recorder 

## 2020-09-19 ENCOUNTER — Encounter: Payer: Self-pay | Admitting: Urology

## 2020-09-19 ENCOUNTER — Other Ambulatory Visit: Payer: Self-pay

## 2020-09-19 ENCOUNTER — Ambulatory Visit: Payer: Medicare Other | Admitting: Urology

## 2020-09-19 VITALS — BP 123/74 | HR 67

## 2020-09-19 DIAGNOSIS — N184 Chronic kidney disease, stage 4 (severe): Secondary | ICD-10-CM | POA: Diagnosis not present

## 2020-09-19 DIAGNOSIS — N329 Bladder disorder, unspecified: Secondary | ICD-10-CM

## 2020-09-19 NOTE — Patient Instructions (Signed)
Bladder Biopsy ?A bladder biopsy is a procedure to remove a small sample of tissue from the bladder. The procedure is done so that the tissue can be examined under a microscope. You may have a bladder biopsy to diagnose or rule out cancer of the bladder. ?During a bladder biopsy, your health care provider may insert a long, thin scope with a lighted camera (cystoscope) into the urethra and move it into your bladder. The cystoscope will allow your health care provider to check the lining of the urethra and bladder and remove the tissue sample. If your health care provider also needs to check your ureters, a longer tube (ureteroscope) may be used. ?Tell your health care provider about: ?Any allergies you have. ?All medicines you are taking, including vitamins, herbs, eye drops, creams, and over-the-counter medicines. ?Any problems you or family members have had with anesthetic medicines. ?Any blood disorders you have. ?Any surgeries you have had. ?Any medical conditions you have. ?Whether you are pregnant or may be pregnant. ?What are the risks? ?Generally, this is a safe procedure. However, problems may occur, including: ?Bleeding. ?Infection, especially a urinary tract infection (UTI). ?Allergic reactions to medicines. ?Damage to nearby structures or organs, including the urethra, bladder, or ureters. ?Abdominal pain. ?Burning or pain during urination. ?Narrowing of the urethra due to scar tissue. ?Difficulty urinating due to swelling. ?What happens before the procedure? ?Medicines ?Ask your health care provider about: ?Changing or stopping your regular medicines. This is especially important if you are taking diabetes medicines or blood thinners. ?Taking medicines such as aspirin and ibuprofen. These medicines can thin your blood. Do not take these medicines unless your health care provider tells you to take them. ?Taking over-the-counter medicines, vitamins, herbs, and supplements. ?Surgery safety ?Ask your health  care provider: ?How your surgery site will be marked. ?What steps will be taken to help prevent infection. These may include: ?Removing hair at the surgery site. ?Washing skin with a germ-killing soap. ?Receiving antibiotic medicine. ?General instructions ?Follow instructions from your health care provider about eating and drinking restrictions. ?You may be asked to drink plenty of fluids. ?You may be asked to urinate right before the procedure. You may have a urine sample taken for UTI testing. ?Plan to have someone take you home from the hospital or clinic. ?If you will be going home right after the procedure, plan to have someone with you for 24 hours. ?What happens during the procedure? ? ?An IV may be inserted into one of your veins. ?You may be given one or more of the following: ?A medicine to help you relax (sedative). ?A medicine to numb the opening of the urethra (local anesthetic). ?A medicine to make you fall asleep (general anesthetic). ?You will lie on your back with your knees bent and spread apart. ?The cystoscope or ureteroscope will be inserted into your urethra and guided into your bladder or ureters. ?Your bladder may be slowly filled with germ-free (sterile) water. This will make it easier for your health care provider to view the wall or lining of your bladder. ?Small instruments will be inserted through the scope to collect a small tissue sample that will be examined under a microscope. ?The procedure may vary among health care providers and hospitals. ?What happens after the procedure? ?Your blood pressure, heart rate, breathing rate, and blood oxygen level will be monitored until you leave the hospital or clinic. ?You may be asked to empty your bladder, or your bladder may be emptied for you. ?Do   not drive for 24 hours if you received a sedative. ?Summary ?A bladder biopsy is a procedure to remove a small sample of tissue from the bladder. ?You may have a bladder biopsy to diagnose or rule  out cancer of the bladder. ?Follow instructions from your health care provider about eating and drinking restrictions. ?Ask your health care provider if you need to stop or change any medicines that you are taking. ?If you will be going home right after the procedure, plan to have someone with you for 24 hours. ?This information is not intended to replace advice given to you by your health care provider. Make sure you discuss any questions you have with your health care provider. ?Document Revised: 07/07/2018 Document Reviewed: 07/07/2018 ?Elsevier Patient Education ? 2022 [employer] ? ?

## 2020-09-19 NOTE — Progress Notes (Signed)
Fill and Pull Catheter Removal  Patient is present today for a catheter removal.  Patient was cleaned and prepped in a sterile fashion 160ml of sterile water/ saline was instilled into the bladder when the patient felt the urge to urinate. 6ml of water was then drained from the balloon.  A 22FR foley cath was removed from the bladder no complications were noted .  Patient as then given some time to void on their own.  Patient voided when catheter was removed and urine was not collected.   Performed by: Estill Bamberg RN  Follow up/ Additional notes: bedside bag was taped with duct tape to patient leg. Blisters noted to left thigh. Dr. Alyson Ingles in room to review skin.

## 2020-09-19 NOTE — Progress Notes (Signed)
Urological Symptom Review  Patient is experiencing the following symptoms: Patient has catheter   Review of Systems  Gastrointestinal (upper)  : Negative for upper GI symptoms  Gastrointestinal (lower) : Negative for lower GI symptoms  Constitutional : Negative for symptoms  Skin: Negative for skin symptoms  Eyes: Negative for eye symptoms  Ear/Nose/Throat : Negative for Ear/Nose/Throat symptoms  Hematologic/Lymphatic: Negative for Hematologic/Lymphatic symptoms  Cardiovascular : Negative for cardiovascular symptoms  Respiratory : Negative for respiratory symptoms  Endocrine: Negative for endocrine symptoms  Musculoskeletal: Negative for musculoskeletal symptoms  Neurological: Negative for neurological symptoms  Psychologic: Negative for psychiatric symptoms  

## 2020-09-19 NOTE — Progress Notes (Signed)
09/19/2020 10:35 AM   Curtis Jenkins May 05, 1935 [id number]  Referring provider: Redmond School, MD [address] [place],  [place] [address]  Followup bladder biopsy   HPI: Mr Curtis Jenkins is a 85yo here for followup after bladder tumor biopsy. Pathology was benign. Voiding trial passed today. No other complaints today.    PMH: Past Medical History:  Diagnosis Date   Arthritis    Atrial fibrillation ([place]) 03/2019   Chronic kidney disease    Dysrhythmia    Hyperlipidemia    Hypertension    Sleep apnea    Stroke ([place]) 03/2019   left sided weakness    Surgical History: Past Surgical History:  Procedure Laterality Date   BACK SURGERY     CYSTOSCOPY N/A 09/10/2020   Procedure: CYSTOSCOPY;  Surgeon: Cleon Gustin, MD;  Location: [facility];  Service: Urology;  Laterality: N/A;   CYSTOSCOPY W/ RETROGRADES Bilateral 08/11/2019   Procedure: CYSTOSCOPY WITH RETROGRADE PYELOGRAM;  Surgeon: Cleon Gustin, MD;  Location: [facility];  Service: Urology;  Laterality: Bilateral;   left knee surgery     LOOP RECORDER INSERTION N/A 04/12/2019   Procedure: LOOP RECORDER INSERTION;  Surgeon: Evans Lance, MD;  Location: [place] [facility];  Service: Cardiovascular;  Laterality: N/A;   TRANSURETHRAL RESECTION OF BLADDER TUMOR Bilateral 08/11/2019   Procedure: TRANSURETHRAL RESECTION OF BLADDER TUMOR (TURBT);  Surgeon: Cleon Gustin, MD;  Location: [facility];  Service: Urology;  Laterality: Bilateral;   TRANSURETHRAL RESECTION OF BLADDER TUMOR N/A 09/10/2020   Procedure: TRANSURETHRAL RESECTION OF BLADDER TUMOR (TURBT);  Surgeon: Cleon Gustin, MD;  Location: [facility];  Service: Urology;  Laterality: N/A;    Home Medications:  Allergies as of 09/19/2020   No Known Allergies      Medication List        Accurate as of September 19, 2020 10:35 AM. If you have any questions, ask your nurse or doctor.          STOP taking these medications    amoxicillin-clavulanate  875-125 MG tablet Commonly known as: Augmentin Stopped by: Nicolette Bang, MD   doxycycline 100 MG tablet Commonly known as: VIBRA-TABS Stopped by: Nicolette Bang, MD   feeding supplement Liqd Stopped by: Nicolette Bang, MD   nitrofurantoin (macrocrystal-monohydrate) 100 MG capsule Commonly known as: MACROBID Stopped by: Nicolette Bang, MD       TAKE these medications    amLODipine 5 MG tablet Commonly known as: NORVASC Take 5 mg by mouth daily.   apixaban 5 MG Tabs tablet Commonly known as: Eliquis Take 1 tablet (5 mg total) by mouth 2 (two) times daily.   clotrimazole-betamethasone cream Commonly known as: LOTRISONE Apply 1 application topically 2 (two) times daily as needed (irritation/rash.).   hydrOXYzine 25 MG tablet Commonly known as: ATARAX/VISTARIL Take 25 mg by mouth every 6 (six) hours as needed for itching.   Magnesium 200 MG Tabs Take 200 mg by mouth at bedtime.   rosuvastatin 20 MG tablet Commonly known as: CRESTOR Take 20 mg by mouth every evening.   traMADol 50 MG tablet Commonly known as: ULTRAM Take 1 tablet (50 mg total) by mouth 3 (three) times daily as needed (pain.).   Vitamin D3 125 MCG (5000 UT) Caps Take 5,000 Units by mouth daily.   zolpidem 10 MG tablet Commonly known as: AMBIEN Take 10 mg by mouth at bedtime.        Allergies: No Known Allergies  Family History: No family history on  file.  Social History:  reports that he has never smoked. He has quit using smokeless tobacco.  His smokeless tobacco use included chew. He reports that he does not currently use alcohol. He reports that he does not use drugs.  ROS: All other review of systems were reviewed and are negative except what is noted above in HPI  Physical Exam: BP 123/74   Pulse 67   Constitutional:  Alert and oriented, No acute distress. HEENT: [place] AT, moist mucus membranes.  Trachea midline, no masses. Cardiovascular: No clubbing, cyanosis, or  edema. Respiratory: Normal respiratory effort, no increased work of breathing. GI: Abdomen is soft, nontender, nondistended, no abdominal masses GU: No CVA tenderness.  Lymph: No cervical or inguinal lymphadenopathy. Skin: No rashes, bruises or suspicious lesions. Neurologic: Grossly intact, no focal deficits, moving all 4 extremities. Psychiatric: Normal mood and affect.  Laboratory Data: Lab Results  Component Value Date   WBC 3.9 (L) 02/10/2020   HGB 12.2 (L) 02/10/2020   HCT 36.0 (L) 02/10/2020   MCV 94.7 02/10/2020   PLT 107 (L) 02/10/2020    Lab Results  Component Value Date   CREATININE 1.86 (H) 09/06/2020    Lab Results  Component Value Date   PSA 0.5 05/20/2019    No results found for: TESTOSTERONE  Lab Results  Component Value Date   HGBA1C 6.3 (H) 04/03/2019    Urinalysis    Component Value Date/Time   COLORURINE YELLOW 02/10/2020 1830   APPEARANCEUR Clear 08/01/2020 1540   LABSPEC 1.018 02/10/2020 1830   PHURINE 5.0 02/10/2020 1830   GLUCOSEU Negative 08/01/2020 1540   HGBUR MODERATE (A) 02/10/2020 1830   BILIRUBINUR Negative 08/01/2020 1540   [place] 02/10/2020 1830   PROTEINUR 2+ (A) 08/01/2020 1540   PROTEINUR 100 (A) 02/10/2020 1830   UROBILINOGEN 0.2 05/20/2019 1137   UROBILINOGEN 0.2 04/11/2010 1329   NITRITE Negative 08/01/2020 1540   NITRITE NEGATIVE 02/10/2020 1830   LEUKOCYTESUR 3+ (A) 08/01/2020 1540   LEUKOCYTESUR LARGE (A) 02/10/2020 1830    Lab Results  Component Value Date   LABMICR See below: 08/01/2020   WBCUA >30 (A) 08/01/2020   LABEPIT 0-10 08/01/2020   MUCUS Present 08/01/2020   BACTERIA Few (A) 08/01/2020    Pertinent Imaging:  Results for orders placed during the hospital encounter of 04/09/19  DG Abd 1 View  Narrative CLINICAL DATA:  NG tube placement  EXAM: ABDOMEN - 1 VIEW  COMPARISON:  April 09, 2019  FINDINGS: The NG tube again projects over the left upper quadrant with the side hole  near the GE junction. The bowel gas pattern is nonobstructive and nonspecific. There are degenerative changes of the lumbar spine.  IMPRESSION: NG tube is unchanged in positioning. If possible, further advancement into the stomach is recommended.   Electronically Signed By: Constance Holster M.D. On: 04/09/2019 19:11  No results found for this or any previous visit.  No results found for this or any previous visit.  No results found for this or any previous visit.  Results for orders placed during the hospital encounter of 11/15/09  US Renal  Narrative Clinical Data: History of renal insufficiency.  RENAL/URINARY TRACT ULTRASOUND COMPLETE  Comparison:  None.  Findings:  Right Kidney:  Right renal length is 9.4 cm.  No right renal mass is evident.  Left Kidney:  Left renal length is 9.0 cm.  A hypoechoic mass in the upper pole of the left kidney has a a few internal echoes.  No internal color Doppler flow was seen.  No wall thickening or solid nodularity or papillary excrescence is evident.  The area measures 2.8 x 2.4 x 2.3 cm.  Examination of both kidneys shows no evidence of solid mass, hydronephrosis, calculus, or parenchymal loss.  There is increased echogenicity of parenchyma both kidneys.  This may indicate medical renal disease.  Bladder:  No intrinsic bladder lesion is identified involving the mucosa.  Ureteral jets are seen.  There is slight thickening of the bladder wall.  This may reflect hypertrophy.  There is enlargement of the prostate with indentation on bladder base by median lobe of the prostate.  Prostate measured 7 x 5.7 x 4.9 cm.  There is increased echogenicity of liver parenchyma.  No focal lesion is seen.  IMPRESSION: Normal sized kidneys with no hydronephrosis.  Increased echogenicity of parenchyma of kidneys may reflect medical renal disease.  A hypoechoic mass in the upper pole of the left kidney has a a few internal echoes.  No  internal color Doppler flow was seen.  No wall thickening or solid nodularity or papillary excrescence is evident. The area measures 2.8 x 2.4 x 2.3 cm.  There may be slight posterior acoustic enhancement but this is not definite.  The mass is  indeterminate at this time.  It is not a simple cyst.  This may reflect a cyst with some debris within it.  Cystic neoplastic process cannot be excluded.  The mass  may be further characterized by MRI.  Follow-up ultrasound in 3 - 6 months may be obtained to evaluate for stability of size.  Enlargement of prostate with indentation of bladder base by median lobe of the prostate.  Bladder wall thickening may reflect hypertrophy.  Provider: Andres Ege  No results found for this or any previous visit.  No results found for this or any previous visit.  Results for orders placed during the hospital encounter of 05/06/19  CT Renal Stone Study  Narrative CLINICAL DATA:  Hematuria.  EXAM: CT ABDOMEN AND PELVIS WITHOUT CONTRAST  TECHNIQUE: Multidetector CT imaging of the abdomen and pelvis was performed following the standard protocol without IV contrast.  COMPARISON:  Remote renal ultrasound 11/15/2009  FINDINGS: Lower chest: Mild elevation of left hemidiaphragm with adjacent atelectasis/scarring. Small calcifications in the posterior right costophrenic sulcus may be calcified granuloma.  Hepatobiliary: No evidence of focal hepatic abnormality on noncontrast exam. Layering hyperdensity in the gallbladder. No pericholecystic inflammation. No biliary dilatation.  Pancreas: Parenchymal atrophy. No ductal dilatation or inflammation.  Spleen: Normal in size without focal abnormality.  Adrenals/Urinary Tract: Normal adrenal glands. No renal calculi. There is bilateral renal parenchymal thinning. Complex cyst versus adjacent clustered cysts in the posterior mid left kidney measure 3.2 x 2.9 cm. No hydronephrosis. No significant  perinephric edema. No obvious solid mass on noncontrast exam. With ureters are decompressed without stones along the course.  Circumferential bladder wall thickening. There is focal nodular thickening of the posterior right urinary trigone, series 2, image 67. Mass effect on the bladder base from enlarged prostate gland. Generalized perivesicular stranding.  Stomach/Bowel: Nondistended stomach. No small bowel obstruction or inflammation. Normal appendix. Moderate stool burden throughout the colon. There is colonic tortuosity particularly involving the sigmoid. Mild sigmoid diverticulosis without diverticulitis. No colonic wall thickening or inflammation.  Vascular/Lymphatic: Aorto bi-iliac atherosclerosis. No aortic aneurysm. There are small periportal and retroperitoneal lymph nodes are not enlarged by size criteria, nonspecific.  Reproductive: Mildly enlarged prostate gland causes mass effect on the bladder  base. Surgical clips within the prostate posteriorly.  Other: Mild presacral edema. No ascites. No free air or focal fluid collection. No significant body wall hernia.  Musculoskeletal: Advanced degenerative disc disease and facet hypertrophy in the lumbar spine. 8 mm anterolisthesis of L3 on L4 with chronic L3 bilateral pars interarticularis defects. No evidence of focal bone lesion.  IMPRESSION: 1. Circumferential bladder wall thickening with perivesicular stranding, cystitis versus chronic in the setting of enlarged prostate gland. 2. Focal nodular thickening of the posterior right urinary trigone. Recommend further evaluation with cystoscopy. 3. No renal stones or obstructive uropathy. Bilateral renal parenchymal thinning. Complex cyst versus adjacent clustered cysts in the posterior mid left kidney measuring 3.2 x 2.9 cm. A 2.8 cm hypoechoic lesion with seen on remote renal ultrasound 11/15/2009. Lack of significant change in size over the course of 10 years favors  a benign etiology, however this is technically indeterminate on noncontrast CT. 4. Mildly enlarged prostate gland causes mass effect on the bladder base. 5. Advanced degenerative disc disease and facet hypertrophy in the lumbar spine. 8 mm anterolisthesis of L3 on L4 with chronic L3 bilateral pars interarticularis defects. 6. Layering hyperdensity in the gallbladder may represent sludge or stones. No pericholecystic inflammation.  Aortic Atherosclerosis (ICD10-I70.0).   Electronically Signed By: Keith Rake M.D. On: 05/06/2019 17:21   Assessment & Plan:    1. Lesion of bladder -RTC 3 months with cystoscopy   No follow-ups on file.  Nicolette Bang, MD  [facility] [place]

## 2020-09-26 LAB — CUP PACEART REMOTE DEVICE CHECK
Date Time Interrogation Session: 20220909230517
Implantable Pulse Generator Implant Date: 20210330

## 2020-10-01 ENCOUNTER — Ambulatory Visit (INDEPENDENT_AMBULATORY_CARE_PROVIDER_SITE_OTHER): Payer: Medicare Other

## 2020-10-01 DIAGNOSIS — I63531 Cerebral infarction due to unspecified occlusion or stenosis of right posterior cerebral artery: Secondary | ICD-10-CM | POA: Diagnosis not present

## 2020-10-04 DIAGNOSIS — G894 Chronic pain syndrome: Secondary | ICD-10-CM | POA: Diagnosis not present

## 2020-10-04 DIAGNOSIS — I10 Essential (primary) hypertension: Secondary | ICD-10-CM | POA: Diagnosis not present

## 2020-10-04 DIAGNOSIS — E782 Mixed hyperlipidemia: Secondary | ICD-10-CM | POA: Diagnosis not present

## 2020-10-04 DIAGNOSIS — M1711 Unilateral primary osteoarthritis, right knee: Secondary | ICD-10-CM | POA: Diagnosis not present

## 2020-10-04 DIAGNOSIS — N390 Urinary tract infection, site not specified: Secondary | ICD-10-CM | POA: Diagnosis not present

## 2020-10-04 DIAGNOSIS — E1129 Type 2 diabetes mellitus with other diabetic kidney complication: Secondary | ICD-10-CM | POA: Diagnosis not present

## 2020-10-04 DIAGNOSIS — G4709 Other insomnia: Secondary | ICD-10-CM | POA: Diagnosis not present

## 2020-10-08 NOTE — Progress Notes (Signed)
[facility] Summary Report / Loop Recorder 

## 2020-10-26 DIAGNOSIS — N184 Chronic kidney disease, stage 4 (severe): Secondary | ICD-10-CM | POA: Diagnosis not present

## 2020-10-26 DIAGNOSIS — I69354 Hemiplegia and hemiparesis following cerebral infarction affecting left non-dominant side: Secondary | ICD-10-CM | POA: Diagnosis not present

## 2020-10-26 DIAGNOSIS — I63531 Cerebral infarction due to unspecified occlusion or stenosis of right posterior cerebral artery: Secondary | ICD-10-CM | POA: Diagnosis not present

## 2020-10-26 DIAGNOSIS — Z515 Encounter for palliative care: Secondary | ICD-10-CM | POA: Diagnosis not present

## 2020-10-29 ENCOUNTER — Ambulatory Visit (INDEPENDENT_AMBULATORY_CARE_PROVIDER_SITE_OTHER): Payer: Medicare Other

## 2020-10-29 DIAGNOSIS — I63531 Cerebral infarction due to unspecified occlusion or stenosis of right posterior cerebral artery: Secondary | ICD-10-CM | POA: Diagnosis not present

## 2020-10-31 LAB — CUP PACEART REMOTE DEVICE CHECK
Date Time Interrogation Session: 20221019143936
Implantable Pulse Generator Implant Date: 20210330

## 2020-11-07 NOTE — Progress Notes (Signed)
[facility] Summary Report / Loop Recorder 

## 2020-11-26 DIAGNOSIS — I129 Hypertensive chronic kidney disease with stage 1 through stage 4 chronic kidney disease, or unspecified chronic kidney disease: Secondary | ICD-10-CM | POA: Diagnosis not present

## 2020-11-26 DIAGNOSIS — N184 Chronic kidney disease, stage 4 (severe): Secondary | ICD-10-CM | POA: Diagnosis not present

## 2020-11-26 DIAGNOSIS — E785 Hyperlipidemia, unspecified: Secondary | ICD-10-CM | POA: Diagnosis not present

## 2020-11-26 DIAGNOSIS — N390 Urinary tract infection, site not specified: Secondary | ICD-10-CM | POA: Diagnosis not present

## 2020-11-26 DIAGNOSIS — Z87440 Personal history of urinary (tract) infections: Secondary | ICD-10-CM | POA: Diagnosis not present

## 2020-12-03 ENCOUNTER — Ambulatory Visit (INDEPENDENT_AMBULATORY_CARE_PROVIDER_SITE_OTHER): Payer: Medicare Other

## 2020-12-03 DIAGNOSIS — I63531 Cerebral infarction due to unspecified occlusion or stenosis of right posterior cerebral artery: Secondary | ICD-10-CM

## 2020-12-04 LAB — CUP PACEART REMOTE DEVICE CHECK
Date Time Interrogation Session: 20221121081324
Implantable Pulse Generator Implant Date: 20210330

## 2020-12-12 NOTE — Progress Notes (Signed)
[facility] Summary Report / Loop Recorder 

## 2020-12-17 DIAGNOSIS — I63531 Cerebral infarction due to unspecified occlusion or stenosis of right posterior cerebral artery: Secondary | ICD-10-CM | POA: Diagnosis not present

## 2020-12-17 DIAGNOSIS — N184 Chronic kidney disease, stage 4 (severe): Secondary | ICD-10-CM | POA: Diagnosis not present

## 2020-12-17 DIAGNOSIS — Z515 Encounter for palliative care: Secondary | ICD-10-CM | POA: Diagnosis not present

## 2020-12-19 ENCOUNTER — Other Ambulatory Visit: Payer: Medicare Other | Admitting: Urology

## 2021-01-03 LAB — CUP PACEART REMOTE DEVICE CHECK
Date Time Interrogation Session: 20221222085605
Implantable Pulse Generator Implant Date: 20210330

## 2021-01-08 ENCOUNTER — Ambulatory Visit (INDEPENDENT_AMBULATORY_CARE_PROVIDER_SITE_OTHER): Payer: Medicare Other

## 2021-01-08 DIAGNOSIS — I63531 Cerebral infarction due to unspecified occlusion or stenosis of right posterior cerebral artery: Secondary | ICD-10-CM | POA: Diagnosis not present

## 2021-01-18 NOTE — Progress Notes (Signed)
[facility] Summary Report / Loop Recorder 

## 2021-01-21 DIAGNOSIS — G894 Chronic pain syndrome: Secondary | ICD-10-CM | POA: Diagnosis not present

## 2021-01-21 DIAGNOSIS — M1990 Unspecified osteoarthritis, unspecified site: Secondary | ICD-10-CM | POA: Diagnosis not present

## 2021-01-21 DIAGNOSIS — E782 Mixed hyperlipidemia: Secondary | ICD-10-CM | POA: Diagnosis not present

## 2021-01-21 DIAGNOSIS — G4709 Other insomnia: Secondary | ICD-10-CM | POA: Diagnosis not present

## 2021-01-21 DIAGNOSIS — I10 Essential (primary) hypertension: Secondary | ICD-10-CM | POA: Diagnosis not present

## 2021-01-21 DIAGNOSIS — M1711 Unilateral primary osteoarthritis, right knee: Secondary | ICD-10-CM | POA: Diagnosis not present

## 2021-01-28 DIAGNOSIS — I63531 Cerebral infarction due to unspecified occlusion or stenosis of right posterior cerebral artery: Secondary | ICD-10-CM | POA: Diagnosis not present

## 2021-01-28 DIAGNOSIS — N184 Chronic kidney disease, stage 4 (severe): Secondary | ICD-10-CM | POA: Diagnosis not present

## 2021-01-28 DIAGNOSIS — Z515 Encounter for palliative care: Secondary | ICD-10-CM | POA: Diagnosis not present

## 2021-02-06 ENCOUNTER — Ambulatory Visit (INDEPENDENT_AMBULATORY_CARE_PROVIDER_SITE_OTHER): Payer: Medicare Other | Admitting: Urology

## 2021-02-06 ENCOUNTER — Other Ambulatory Visit: Payer: Self-pay

## 2021-02-06 VITALS — BP 139/71 | HR 77

## 2021-02-06 DIAGNOSIS — N329 Bladder disorder, unspecified: Secondary | ICD-10-CM | POA: Diagnosis not present

## 2021-02-06 NOTE — Progress Notes (Signed)
° °  02/06/21  CC: followup bladder tumor   HPI: Mr Loveland is a 86yo here for followup for a benign bladder tumor Blood pressure 139/71, pulse 77. NED. A&Ox3.   No respiratory distress   Abd soft, NT, ND Normal phallus with bilateral descended testicles  Cystoscopy Procedure Note  Patient identification was confirmed, informed consent was obtained, and patient was prepped using Betadine solution.  Lidocaine jelly was administered per urethral meatus.     Pre-Procedure: - Inspection reveals a normal caliber ureteral meatus.  Procedure: The flexible cystoscope was introduced without difficulty - No urethral strictures/lesions are present. - Enlarged prostate  - Normal bladder neck - Bilateral ureteral orifices identified - Bladder mucosa  reveals no ulcers, tumors, or lesions - No bladder stones - No trabeculation   Post-Procedure: - Patient tolerated the procedure well  Assessment/ Plan:   Return in about 6 months (around 08/06/2021) for cystoscopy.  Nicolette Bang, MD

## 2021-02-06 NOTE — Progress Notes (Signed)

## 2021-02-11 ENCOUNTER — Encounter: Payer: Self-pay | Admitting: Urology

## 2021-02-11 ENCOUNTER — Ambulatory Visit (INDEPENDENT_AMBULATORY_CARE_PROVIDER_SITE_OTHER): Payer: Medicare Other

## 2021-02-11 DIAGNOSIS — I63531 Cerebral infarction due to unspecified occlusion or stenosis of right posterior cerebral artery: Secondary | ICD-10-CM | POA: Diagnosis not present

## 2021-02-12 LAB — CUP PACEART REMOTE DEVICE CHECK
Date Time Interrogation Session: 20230131074847
Implantable Pulse Generator Implant Date: 20210330

## 2021-02-19 NOTE — Progress Notes (Signed)
[facility] Summary Report / Loop Recorder 

## 2021-03-05 DIAGNOSIS — H2512 Age-related nuclear cataract, left eye: Secondary | ICD-10-CM | POA: Diagnosis not present

## 2021-03-05 DIAGNOSIS — H26491 Other secondary cataract, right eye: Secondary | ICD-10-CM | POA: Diagnosis not present

## 2021-03-05 DIAGNOSIS — Z961 Presence of intraocular lens: Secondary | ICD-10-CM | POA: Diagnosis not present

## 2021-03-18 ENCOUNTER — Ambulatory Visit (INDEPENDENT_AMBULATORY_CARE_PROVIDER_SITE_OTHER): Payer: Medicare Other

## 2021-03-18 DIAGNOSIS — I63531 Cerebral infarction due to unspecified occlusion or stenosis of right posterior cerebral artery: Secondary | ICD-10-CM

## 2021-03-19 LAB — CUP PACEART REMOTE DEVICE CHECK
Date Time Interrogation Session: 20230306122646
Implantable Pulse Generator Implant Date: 20210330

## 2021-03-20 DIAGNOSIS — E1129 Type 2 diabetes mellitus with other diabetic kidney complication: Secondary | ICD-10-CM | POA: Diagnosis not present

## 2021-03-20 DIAGNOSIS — N184 Chronic kidney disease, stage 4 (severe): Secondary | ICD-10-CM | POA: Diagnosis not present

## 2021-03-20 DIAGNOSIS — M1991 Primary osteoarthritis, unspecified site: Secondary | ICD-10-CM | POA: Diagnosis not present

## 2021-03-20 DIAGNOSIS — I10 Essential (primary) hypertension: Secondary | ICD-10-CM | POA: Diagnosis not present

## 2021-03-20 DIAGNOSIS — N390 Urinary tract infection, site not specified: Secondary | ICD-10-CM | POA: Diagnosis not present

## 2021-03-25 DIAGNOSIS — I63531 Cerebral infarction due to unspecified occlusion or stenosis of right posterior cerebral artery: Secondary | ICD-10-CM | POA: Diagnosis not present

## 2021-03-25 DIAGNOSIS — Z515 Encounter for palliative care: Secondary | ICD-10-CM | POA: Diagnosis not present

## 2021-03-25 DIAGNOSIS — N184 Chronic kidney disease, stage 4 (severe): Secondary | ICD-10-CM | POA: Diagnosis not present

## 2021-04-01 NOTE — Progress Notes (Signed)
[facility] Summary Report / Loop Recorder 

## 2021-04-16 ENCOUNTER — Encounter: Payer: Self-pay | Admitting: Internal Medicine

## 2021-04-16 ENCOUNTER — Ambulatory Visit: Payer: Medicare Other | Admitting: Internal Medicine

## 2021-04-16 VITALS — BP 134/60 | HR 81 | Ht 72.0 in | Wt 240.0 lb

## 2021-04-16 DIAGNOSIS — I480 Paroxysmal atrial fibrillation: Secondary | ICD-10-CM

## 2021-04-16 NOTE — Patient Instructions (Signed)
Medication Instructions:  ?Your physician recommends that you continue on your current medications as directed. Please refer to the Current Medication list given to you today. ? ?*If you need a refill on your cardiac medications before your next appointment, please call your pharmacy* ? ? ?Lab Work: ?NONE  ? ?If you have labs (blood work) drawn today and your tests are completely normal, you will receive your results only by: ?MyChart Message (if you have MyChart) OR ?A paper copy in the mail ?If you have any lab test that is abnormal or we need to change your treatment, we will call you to review the results. ? ? ?Testing/Procedures: ?NONE  ? ? ?Follow-Up: ?At [facility], you and your health needs are our priority.  As part of our continuing mission to provide you with exceptional heart care, we have created designated Provider Care Teams.  These Care Teams include your primary Cardiologist (physician) and Advanced Practice Providers (APPs -  Physician Assistants and Nurse Practitioners) who all work together to provide you with the care you need, when you need it. ? ?We recommend signing up for the patient portal called "MyChart".  Sign up information is provided on this After Visit Summary.  MyChart is used to connect with patients for Virtual Visits (Telemedicine).  Patients are able to view lab/test results, encounter notes, upcoming appointments, etc.  Non-urgent messages can be sent to your provider as well.   ?To learn more about what you can do with MyChart, go to [email or web address].   ? ?Your next appointment:   ?1 year(s) ? ?The format for your next appointment:   ?In Person ? ?Provider:   ?Cristopher Peru, MD  ? ? ?Other Instructions ?Thank you for choosing [facility]! ? ? ?[id number]}   ? ? ?

## 2021-04-16 NOTE — Progress Notes (Signed)
? ? ? ? ?HPI ?Curtis Jenkins returns today for followup. He is a pleasant 86 yo man with a cryptogenic stroke, s/p ILR insertion who was found to have PAF and placed on Eliquis. He had hematuria and the eliquis was initially stopped. He then had a urological procedure and eliquis restarted. He does not have palpitations. He has not had chest pain or sob. He has developed osteo of the heal and has had surgery. No fever or chills. ?No Known Allergies ? ? ?Current Outpatient Medications  ?Medication Sig Dispense Refill  ? amLODipine (NORVASC) 5 MG tablet Take 5 mg by mouth daily.    ? apixaban (ELIQUIS) 5 MG TABS tablet Take 1 tablet (5 mg total) by mouth 2 (two) times daily. 60 tablet 6  ? Cholecalciferol (VITAMIN D3) 125 MCG (5000 UT) CAPS Take 5,000 Units by mouth daily.    ? clotrimazole-betamethasone (LOTRISONE) cream Apply 1 application topically 2 (two) times daily as needed (irritation/rash.).     ? hydrOXYzine (ATARAX/VISTARIL) 25 MG tablet Take 25 mg by mouth every 6 (six) hours as needed for itching.    ? Magnesium 200 MG TABS Take 200 mg by mouth at bedtime.    ? rosuvastatin (CRESTOR) 20 MG tablet Take 20 mg by mouth every evening.     ? traMADol (ULTRAM) 50 MG tablet Take 1 tablet (50 mg total) by mouth 3 (three) times daily as needed (pain.). 30 tablet 0  ? zolpidem (AMBIEN) 10 MG tablet Take 10 mg by mouth at bedtime.    ? ?No current facility-administered medications for this visit.  ? ? ? ?Past Medical History:  ?Diagnosis Date  ? Arthritis   ? Atrial fibrillation ([place]) 03/2019  ? Chronic kidney disease   ? Dysrhythmia   ? Hyperlipidemia   ? Hypertension   ? Sleep apnea   ? Stroke [facility]) 03/2019  ? left sided weakness  ? ? ?ROS: ? ? All systems reviewed and negative except as noted in the HPI. ? ? ?Past Surgical History:  ?Procedure Laterality Date  ? BACK SURGERY    ? CYSTOSCOPY N/A 09/10/2020  ? Procedure: CYSTOSCOPY;  Surgeon: Cleon Gustin, MD;  Location: [facility];  Service: Urology;  Laterality:  N/A;  ? CYSTOSCOPY W/ RETROGRADES Bilateral 08/11/2019  ? Procedure: CYSTOSCOPY WITH RETROGRADE PYELOGRAM;  Surgeon: Cleon Gustin, MD;  Location: [facility];  Service: Urology;  Laterality: Bilateral;  ? left knee surgery    ? LOOP RECORDER INSERTION N/A 04/12/2019  ? Procedure: LOOP RECORDER INSERTION;  Surgeon: Evans Lance, MD;  Location: [place] [facility];  Service: Cardiovascular;  Laterality: N/A;  ? TRANSURETHRAL RESECTION OF BLADDER TUMOR Bilateral 08/11/2019  ? Procedure: TRANSURETHRAL RESECTION OF BLADDER TUMOR (TURBT);  Surgeon: Cleon Gustin, MD;  Location: [facility];  Service: Urology;  Laterality: Bilateral;  ? TRANSURETHRAL RESECTION OF BLADDER TUMOR N/A 09/10/2020  ? Procedure: TRANSURETHRAL RESECTION OF BLADDER TUMOR (TURBT);  Surgeon: Cleon Gustin, MD;  Location: [facility];  Service: Urology;  Laterality: N/A;  ? ? ? ?No family history on file. ? ? ?Social History  ? ?Socioeconomic History  ? Marital status: Married  ?  Spouse name: Not on file  ? Number of children: Not on file  ? Years of education: Not on file  ? Highest education level: Not on file  ?Occupational History  ? Not on file  ?Tobacco Use  ? Smoking status: Never  ? Smokeless tobacco: Former  ?  Types: Chew  ?  Substance and Sexual Activity  ? Alcohol use: Not Currently  ? Drug use: Never  ? Sexual activity: Not on file  ?Other Topics Concern  ? Not on file  ?Social History Narrative  ? Not on file  ? ?Social Determinants of Health  ? ?Financial Resource Strain: Not on file  ?Food Insecurity: Not on file  ?Transportation Needs: Not on file  ?Physical Activity: Not on file  ?Stress: Not on file  ?Social Connections: Not on file  ?Intimate Partner Violence: Not on file  ? ? ? ?There were no vitals taken for this visit. ? ?Physical Exam: ? ?Well appearing NAD ?HEENT: Unremarkable ?Neck:  No JVD, no thyromegally ?Lymphatics:  No adenopathy ?Back:  No CVA tenderness ?Lungs:  Clear ?HEART:  Regular rate rhythm, no murmurs, no rubs,  no clicks ?Abd:  soft, positive bowel sounds, no organomegally, no rebound, no guarding ?Ext:  2 plus pulses, no edema, no cyanosis, no clubbing ?Skin:  No rashes no nodules ?Neuro:  CN II through XII intact, motor grossly intact ? ?DEVICE  ?Normal device function.  See PaceArt for details.  ? ?Assess/Plan:  ?1. PAF - he is asymptomatic and will continue his eliquis. ?2. Stroke - he has not had additional symptoms. ?3. HTN - his bp is well controlled today. We will follow. ?4. Dyslipidemia - he will continue his statin therapy. ?  ?Carleene Overlie Ralonda Tartt,MD ?

## 2021-04-22 ENCOUNTER — Ambulatory Visit (INDEPENDENT_AMBULATORY_CARE_PROVIDER_SITE_OTHER): Payer: Medicare Other

## 2021-04-22 DIAGNOSIS — I63531 Cerebral infarction due to unspecified occlusion or stenosis of right posterior cerebral artery: Secondary | ICD-10-CM | POA: Diagnosis not present

## 2021-04-24 LAB — CUP PACEART REMOTE DEVICE CHECK
Date Time Interrogation Session: 20230412071903
Implantable Pulse Generator Implant Date: 20210330

## 2021-04-25 DIAGNOSIS — H25812 Combined forms of age-related cataract, left eye: Secondary | ICD-10-CM | POA: Diagnosis not present

## 2021-04-25 DIAGNOSIS — H26491 Other secondary cataract, right eye: Secondary | ICD-10-CM | POA: Diagnosis not present

## 2021-04-25 DIAGNOSIS — H01001 Unspecified blepharitis right upper eyelid: Secondary | ICD-10-CM | POA: Diagnosis not present

## 2021-05-08 DIAGNOSIS — Z515 Encounter for palliative care: Secondary | ICD-10-CM | POA: Diagnosis not present

## 2021-05-08 DIAGNOSIS — N184 Chronic kidney disease, stage 4 (severe): Secondary | ICD-10-CM | POA: Diagnosis not present

## 2021-05-08 DIAGNOSIS — F01518 Vascular dementia, unspecified severity, with other behavioral disturbance: Secondary | ICD-10-CM | POA: Diagnosis not present

## 2021-05-08 DIAGNOSIS — I63531 Cerebral infarction due to unspecified occlusion or stenosis of right posterior cerebral artery: Secondary | ICD-10-CM | POA: Diagnosis not present

## 2021-05-08 NOTE — Progress Notes (Signed)
[facility] Summary Report / Loop Recorder 

## 2021-05-27 ENCOUNTER — Ambulatory Visit (INDEPENDENT_AMBULATORY_CARE_PROVIDER_SITE_OTHER): Payer: Medicare Other

## 2021-05-27 DIAGNOSIS — H25812 Combined forms of age-related cataract, left eye: Secondary | ICD-10-CM | POA: Diagnosis not present

## 2021-05-27 DIAGNOSIS — I63531 Cerebral infarction due to unspecified occlusion or stenosis of right posterior cerebral artery: Secondary | ICD-10-CM

## 2021-05-28 LAB — CUP PACEART REMOTE DEVICE CHECK
Date Time Interrogation Session: 20230511075444
Implantable Pulse Generator Implant Date: 20210330

## 2021-06-14 NOTE — Progress Notes (Signed)
[facility] Summary Report / Loop Recorder 

## 2021-06-27 NOTE — H&P (Signed)
Surgical History & Physical  Patient Name: Curtis Jenkins DOB: 1935-04-01  Surgery: Cataract extraction with intraocular lens implant phacoemulsification; Left Eye  Surgeon: Baruch Goldmann MD Surgery Date:  07-08-21 Pre-Op Date:  06-27-21  HPI: A 60 Yr. old male patient 1. The patient is returning after YAG capsulotomy. The right eye is affected. Status post yag capsulotomy, which began 4 weeks ago: Since the last visit, the affected area is doing well. The patient's vision is stable. Patient is not taking medications. 2. 2. The patient complains of difficulty when viewing TV, reading closed caption, news scrolls on TV, which began 2 years ago. The left eye is affected. The episode is gradual. The condition's severity increased since last visit. Symptoms occur when the patient is inside and outside. This is negatively affecting the patient's quality of life and the patient is unable to function adequately in life with the current level of vision. HPI was performed by Baruch Goldmann .  Medical History: Cataracts  Arthritis High Blood Pressure Hypercholesterolemia Ischemic stroke  Review of Systems Negative Allergic/Immunologic Negative Cardiovascular Negative Constitutional Negative Ear, Nose, Mouth & Throat Negative Endocrine Negative Eyes Negative Gastrointestinal Negative Genitourinary Negative Hemotologic/Lymphatic Negative Integumentary Negative Musculoskeletal Negative Neurological Negative Psychiatry Negative Respiratory  Social   Never smoked   Medication  Clotrimazole- betamethasone, Amlodipine, Eliquis, Hydroxyzine, Rosuvastatin, Tramadol hydrochloride, Zolpidem,   Sx/Procedures Cataract extraction OD, Laser - Yag Capsulotomy,    Drug Allergies   NKDA  History & Physical: Heent: Cataract, left eye NECK: supple without bruits LUNGS: lungs clear to auscultation CV: regular rate and rhythm Abdomen: soft and non-tender Impression & Plan: Assessment: 1.   ENCOUNTER FOR SURGICAL AFTERCARE FOLLOWING SURGERY ON THE SENSE ORGANS (Z48.810) 2.  COMBINED FORMS AGE RELATED CATARACT; Left Eye (H25.812) 3.  PCO; Right Eye (H26.491) - Resolved 4.  INTRAOCULAR LENS IOL ; Right Eye (Z96.1) 5.  BLEPHARITIS; Right Upper Lid, Right Lower Lid, Left Upper Lid, Left Lower Lid (H01.001, H01.002,H01.004,H01.005)  Plan: 1.  S/P YAG Right Eye. Doing well, improved vision, normal eye pressure.  2.  Cataract accounts for the patient's decreased vision. This visual impairment is not correctable with a tolerable change in glasses or contact lenses. Cataract surgery with an implantation of a new lens should significantly improve the visual and functional status of the patient. Discussed all risks, benefits, alternatives, and potential complications. Discussed the procedures and recovery. Patient desires to have surgery. A-scan ordered and performed today for intra-ocular lens calculations. The surgery will be performed in order to improve vision for driving, reading, and for eye examinations. Recommend phacoemulsification with intra-ocular lens. Recommend Dextenza for post-operative pain and inflammation. Left Eye. Dilates poorly - shugarcaine by protocol. Malyugin Ring. Omidira. Goal of +2.00 to balance vision.  3.  Symptomatic and visually significant. Accounts for patient's symptoms and is negatively affecting the patient's quality of life. Reviewed risks of the Yag laser including, but not limited to, IOP spike, retinal detachment, and chronic inflammation. All benefits, alternatives, and complications discussed, right eye.Marland Kitchen Posterior capsule opacification is causing visual impairment independent of any other ocular diseases. There is a reasonable expectation that a YAG capsulotomy will improve the patient's vision. Patient wishes to proceed and the procedure is ordered. 1-2 Weeks Postop/IOP Check.  4.  S/P YAG OD.  5.  recommend regular cleaning.

## 2021-07-01 ENCOUNTER — Ambulatory Visit (INDEPENDENT_AMBULATORY_CARE_PROVIDER_SITE_OTHER): Payer: Medicare Other

## 2021-07-01 DIAGNOSIS — I63531 Cerebral infarction due to unspecified occlusion or stenosis of right posterior cerebral artery: Secondary | ICD-10-CM

## 2021-07-01 DIAGNOSIS — H25812 Combined forms of age-related cataract, left eye: Secondary | ICD-10-CM | POA: Diagnosis not present

## 2021-07-02 ENCOUNTER — Encounter (HOSPITAL_COMMUNITY)
Admission: RE | Admit: 2021-07-02 | Discharge: 2021-07-02 | Disposition: A | Discharge status: Home / Self Care | Payer: Medicare Other | Source: Ambulatory Visit | Attending: Ophthalmology | Admitting: Ophthalmology

## 2021-07-02 ENCOUNTER — Encounter (HOSPITAL_COMMUNITY): Payer: Self-pay

## 2021-07-03 LAB — CUP PACEART REMOTE DEVICE CHECK
Date Time Interrogation Session: 20230621125432
Implantable Pulse Generator Implant Date: 20210330

## 2021-07-08 ENCOUNTER — Encounter (HOSPITAL_COMMUNITY)
Admission: RE | Disposition: A | Discharge status: Home / Self Care | Payer: Self-pay | Source: Home / Self Care | Attending: Ophthalmology

## 2021-07-08 ENCOUNTER — Ambulatory Visit (HOSPITAL_COMMUNITY): Payer: Medicare Other | Admitting: Anesthesiology

## 2021-07-08 ENCOUNTER — Ambulatory Visit (HOSPITAL_BASED_OUTPATIENT_CLINIC_OR_DEPARTMENT_OTHER): Payer: Medicare Other | Admitting: Anesthesiology

## 2021-07-08 ENCOUNTER — Ambulatory Visit (HOSPITAL_COMMUNITY)
Admission: RE | Admit: 2021-07-08 | Discharge: 2021-07-08 | Disposition: A | Discharge status: Home / Self Care | Payer: Medicare Other | Attending: Ophthalmology | Admitting: Ophthalmology

## 2021-07-08 DIAGNOSIS — Z961 Presence of intraocular lens: Secondary | ICD-10-CM | POA: Insufficient documentation

## 2021-07-08 DIAGNOSIS — H2181 Floppy iris syndrome: Secondary | ICD-10-CM | POA: Insufficient documentation

## 2021-07-08 DIAGNOSIS — N189 Chronic kidney disease, unspecified: Secondary | ICD-10-CM

## 2021-07-08 DIAGNOSIS — H01002 Unspecified blepharitis right lower eyelid: Secondary | ICD-10-CM | POA: Diagnosis not present

## 2021-07-08 DIAGNOSIS — H01004 Unspecified blepharitis left upper eyelid: Secondary | ICD-10-CM | POA: Insufficient documentation

## 2021-07-08 DIAGNOSIS — I129 Hypertensive chronic kidney disease with stage 1 through stage 4 chronic kidney disease, or unspecified chronic kidney disease: Secondary | ICD-10-CM | POA: Diagnosis not present

## 2021-07-08 DIAGNOSIS — H01005 Unspecified blepharitis left lower eyelid: Secondary | ICD-10-CM | POA: Diagnosis not present

## 2021-07-08 DIAGNOSIS — G4730 Sleep apnea, unspecified: Secondary | ICD-10-CM | POA: Diagnosis not present

## 2021-07-08 DIAGNOSIS — Z87891 Personal history of nicotine dependence: Secondary | ICD-10-CM

## 2021-07-08 DIAGNOSIS — H25812 Combined forms of age-related cataract, left eye: Secondary | ICD-10-CM

## 2021-07-08 DIAGNOSIS — H01001 Unspecified blepharitis right upper eyelid: Secondary | ICD-10-CM | POA: Diagnosis not present

## 2021-07-08 HISTORY — PX: CATARACT EXTRACTION W/PHACO: SHX586

## 2021-07-08 SURGERY — PHACOEMULSIFICATION, CATARACT, WITH IOL INSERTION
Anesthesia: Monitor Anesthesia Care | Site: Eye | Laterality: Left

## 2021-07-08 MED ORDER — PHENYLEPHRINE-KETOROLAC 1-0.3 % IO SOLN
INTRAOCULAR | Status: AC
  Filled 2021-07-08: qty 4

## 2021-07-08 MED ORDER — TRYPAN BLUE 0.06 % IO SOSY
PREFILLED_SYRINGE | INTRAOCULAR | Status: DC | PRN
  Administered 2021-07-08: 0.5 mL via INTRAOCULAR

## 2021-07-08 MED ORDER — SODIUM HYALURONATE 23MG/ML IO SOSY
PREFILLED_SYRINGE | INTRAOCULAR | Status: DC | PRN
  Administered 2021-07-08: 0.6 mL via INTRAOCULAR

## 2021-07-08 MED ORDER — EPINEPHRINE PF 1 MG/ML IJ SOLN
INTRAMUSCULAR | Status: AC
  Filled 2021-07-08: qty 2

## 2021-07-08 MED ORDER — MIDAZOLAM HCL 2 MG/2ML IJ SOLN
INTRAMUSCULAR | Status: DC | PRN
  Administered 2021-07-08: 1 mg via INTRAVENOUS

## 2021-07-08 MED ORDER — TRYPAN BLUE 0.06 % IO SOSY
PREFILLED_SYRINGE | INTRAOCULAR | Status: AC
  Filled 2021-07-08: qty 0.5

## 2021-07-08 MED ORDER — SODIUM HYALURONATE 10 MG/ML IO SOLUTION
PREFILLED_SYRINGE | INTRAOCULAR | Status: DC | PRN
  Administered 2021-07-08: 0.85 mL via INTRAOCULAR

## 2021-07-08 MED ORDER — PHENYLEPHRINE HCL 2.5 % OP SOLN
1.0000 [drp] | OPHTHALMIC | Status: AC | PRN
  Administered 2021-07-08 (×3): 1 [drp] via OPHTHALMIC

## 2021-07-08 MED ORDER — MIDAZOLAM HCL 2 MG/2ML IJ SOLN
INTRAMUSCULAR | Status: AC
  Filled 2021-07-08: qty 2

## 2021-07-08 MED ORDER — TROPICAMIDE 1 % OP SOLN
1.0000 [drp] | OPHTHALMIC | Status: AC | PRN
  Administered 2021-07-08 (×3): 1 [drp] via OPHTHALMIC

## 2021-07-08 MED ORDER — LIDOCAINE HCL (PF) 1 % IJ SOLN
INTRAOCULAR | Status: DC | PRN
  Administered 2021-07-08: 1 mL via OPHTHALMIC

## 2021-07-08 MED ORDER — TETRACAINE HCL 0.5 % OP SOLN
1.0000 [drp] | OPHTHALMIC | Status: AC | PRN
  Administered 2021-07-08 (×3): 1 [drp] via OPHTHALMIC

## 2021-07-08 MED ORDER — POVIDONE-IODINE 5 % OP SOLN
OPHTHALMIC | Status: DC | PRN
  Administered 2021-07-08: 1 via OPHTHALMIC

## 2021-07-08 MED ORDER — SODIUM CHLORIDE 0.9% FLUSH
INTRAVENOUS | Status: DC | PRN
  Administered 2021-07-08: 5 mL via INTRAVENOUS
  Administered 2021-07-08: 3 mL via INTRAVENOUS

## 2021-07-08 MED ORDER — FENTANYL CITRATE (PF) 100 MCG/2ML IJ SOLN
INTRAMUSCULAR | Status: DC | PRN
  Administered 2021-07-08: 50 ug via INTRAVENOUS

## 2021-07-08 MED ORDER — LIDOCAINE HCL 3.5 % OP GEL: Freq: Once | OPHTHALMIC | Status: AC

## 2021-07-08 MED ORDER — BSS IO SOLN
INTRAOCULAR | Status: DC | PRN
  Administered 2021-07-08: 15 mL via INTRAOCULAR

## 2021-07-08 MED ORDER — FENTANYL CITRATE (PF) 100 MCG/2ML IJ SOLN
INTRAMUSCULAR | Status: AC
  Filled 2021-07-08: qty 2

## 2021-07-08 MED ORDER — STERILE WATER FOR IRRIGATION IR SOLN
Status: DC | PRN
  Administered 2021-07-08: 500 mL

## 2021-07-08 MED ORDER — PHENYLEPHRINE-KETOROLAC 1-0.3 % IO SOLN
INTRAOCULAR | Status: DC | PRN
  Administered 2021-07-08: 500 mL via OPHTHALMIC

## 2021-07-08 MED ORDER — NEOMYCIN-POLYMYXIN-DEXAMETH 3.5-10000-0.1 OP SUSP
OPHTHALMIC | Status: DC | PRN
  Administered 2021-07-08: 1 [drp] via OPHTHALMIC

## 2021-07-08 SURGICAL SUPPLY — 19 items
CATARACT SUITE SIGHTPATH (MISCELLANEOUS) ×2 IMPLANT
CLOTH BEACON ORANGE TIMEOUT ST (SAFETY) ×2 IMPLANT
EYE SHIELD UNIVERSAL CLEAR (GAUZE/BANDAGES/DRESSINGS) ×1 IMPLANT
FEE CATARACT SUITE SIGHTPATH (MISCELLANEOUS) ×1 IMPLANT
GLOVE BIOGEL PI IND STRL 7.0 (GLOVE) ×2 IMPLANT
GLOVE BIOGEL PI IND STRL 8.5 (GLOVE) IMPLANT
GLOVE BIOGEL PI INDICATOR 7.0 (GLOVE) ×2
GLOVE BIOGEL PI INDICATOR 8.5 (GLOVE) ×1
GOWN STRL REUS W/TWL XL LVL3 (GOWN DISPOSABLE) ×1 IMPLANT
LENS IOL RAYNER 17.5 (Intraocular Lens) ×2 IMPLANT
LENS IOL RAYONE EMV 17.5 (Intraocular Lens) IMPLANT
NDL HYPO 18GX1.5 BLUNT FILL (NEEDLE) IMPLANT
NEEDLE HYPO 18GX1.5 BLUNT FILL (NEEDLE) ×2 IMPLANT
PAD ARMBOARD 7.5X6 YLW CONV (MISCELLANEOUS) ×2 IMPLANT
RING MALYGIN 7.0 (MISCELLANEOUS) ×1 IMPLANT
SYR TB 1ML LL NO SAFETY (SYRINGE) ×2 IMPLANT
TAPE SURG TRANSPORE 1 IN (GAUZE/BANDAGES/DRESSINGS) IMPLANT
TAPE SURGICAL TRANSPORE 1 IN (GAUZE/BANDAGES/DRESSINGS) ×2
WATER STERILE IRR 250ML POUR (IV SOLUTION) ×2 IMPLANT

## 2021-07-08 NOTE — Op Note (Signed)
Date of procedure: 07/08/21  Pre-operative diagnosis: Visually significant age-related combined form cataract, Left Eye; Poor dilation, Left eye (H25.812)   Post-operative diagnosis: Visually significant age-related cataract, Left Eye; Intra-operative Floppy Iris Syndrome, Left Eye (H21.81)  Procedure: Complex removal of cataract via phacoemulsification and insertion of intra-ocular lens Rayner [id number] +17.5D into the capsular bag of the Left Eye (CPT [phone])  Attending surgeon: Rudy Jew. Mostafa Yuan, MD, MA  Anesthesia: MAC, Topical Akten  Complications: None  Estimated Blood Loss: <91mL (minimal)  Specimens: None  Implants: As above  Indications:  Visually significant cataract, Left Eye  Procedure:  The patient was seen and identified in the pre-operative area. The operative eye was identified and dilated.  The operative eye was marked.  Topical anesthesia was administered to the operative eye.     The patient was then to the operative suite and placed in the supine position.  A timeout was performed confirming the patient, procedure to be performed, and all other relevant information.   The patient's face was prepped and draped in the usual fashion for intra-ocular surgery.  A lid speculum was placed into the operative eye and the surgical microscope moved into place and focused.  Poor dilation of the iris was confirmed.  An inferotemporal paracentesis was created using a 20 gauge paracentesis blade.  Shugarcaine was injected into the anterior chamber.  Viscoelastic was injected into the anterior chamber.  A temporal clear-corneal main wound incision was created using a 2.74mm microkeratome.  A Malyugin ring was placed.  A continuous curvilinear capsulorrhexis was initiated using an irrigating cystitome and completed using capsulorrhexis forceps. At this point, poor visualization of the capsule was hindering safe surgery. Vision Blue was used to stain the anterior capsule.  The capsulorrhexis was  completed.  Hydrodissection and hydrodeliniation were performed.  Viscoelastic was injected into the anterior chamber.  A phacoemulsification handpiece and a chopper as a second instrument were used to remove the nucleus and epinucleus. The irrigation/aspiration handpiece was used to remove any remaining cortical material.   The capsular bag was reinflated with viscoelastic, checked, and found to be intact.  The intraocular lens was inserted into the capsular bag and dialed into place using a Customer service manager. The Malyugin ring was removed.  The irrigation/aspiration handpiece was used to remove any remaining viscoelastic.  The clear corneal wound and paracentesis wounds were then hydrated and checked with Weck-Cels to be watertight.  The lid-speculum and drape was removed, and the patient's face was cleaned with a wet and dry 4x4.  Maxitrol was instilled in the eye. A clear shield was taped over the eye. The patient was taken to the post-operative care unit in good condition, having tolerated the procedure well.  Post-Op Instructions: The patient will follow up at [facility] for a same day post-operative evaluation and will receive all other orders and instructions.

## 2021-07-08 NOTE — Transfer of Care (Signed)
Immediate Anesthesia Transfer of Care Note  Patient: Curtis Jenkins  Procedure(s) Performed: CATARACT EXTRACTION PHACO AND INTRAOCULAR LENS PLACEMENT (IOC) (Left: Eye)  Patient Location: Short Stay  Anesthesia Type:MAC  Level of Consciousness: awake, alert  and oriented  Airway & Oxygen Therapy: Patient Spontanous Breathing  Post-op Assessment: Report given to RN and Post -op Vital signs reviewed and stable  Post vital signs: Reviewed and stable  Last Vitals:  Vitals Value Taken Time  BP    Temp    Pulse    Resp    SpO2      Last Pain:  Vitals:   07/08/21 1123  TempSrc: Oral  PainSc: 0-No pain         Complications: No notable events documented.

## 2021-07-10 DIAGNOSIS — N184 Chronic kidney disease, stage 4 (severe): Secondary | ICD-10-CM | POA: Diagnosis not present

## 2021-07-10 DIAGNOSIS — I6930 Unspecified sequelae of cerebral infarction: Secondary | ICD-10-CM | POA: Diagnosis not present

## 2021-07-10 DIAGNOSIS — N390 Urinary tract infection, site not specified: Secondary | ICD-10-CM | POA: Diagnosis not present

## 2021-07-10 DIAGNOSIS — E782 Mixed hyperlipidemia: Secondary | ICD-10-CM | POA: Diagnosis not present

## 2021-07-10 DIAGNOSIS — I700 Atherosclerosis of aorta: Secondary | ICD-10-CM | POA: Diagnosis not present

## 2021-07-10 DIAGNOSIS — Z0001 Encounter for general adult medical examination with abnormal findings: Secondary | ICD-10-CM | POA: Diagnosis not present

## 2021-07-10 DIAGNOSIS — E1129 Type 2 diabetes mellitus with other diabetic kidney complication: Secondary | ICD-10-CM | POA: Diagnosis not present

## 2021-07-10 DIAGNOSIS — D518 Other vitamin B12 deficiency anemias: Secondary | ICD-10-CM | POA: Diagnosis not present

## 2021-07-10 DIAGNOSIS — E559 Vitamin D deficiency, unspecified: Secondary | ICD-10-CM | POA: Diagnosis not present

## 2021-07-10 NOTE — Anesthesia Postprocedure Evaluation (Signed)
Anesthesia Post Note  Patient: DONNEL VENUTO  Procedure(s) Performed: CATARACT EXTRACTION PHACO AND INTRAOCULAR LENS PLACEMENT (IOC) (Left: Eye)  Patient location during evaluation: Phase II Anesthesia Type: MAC Level of consciousness: awake Pain management: pain level controlled Vital Signs Assessment: post-procedure vital signs reviewed and stable Respiratory status: spontaneous breathing and respiratory function stable Cardiovascular status: blood pressure returned to baseline and stable Postop Assessment: no headache and no apparent nausea or vomiting Anesthetic complications: no Comments: Late entry   No notable events documented.   Last Vitals:  Vitals:   07/08/21 1123 07/08/21 1217  BP: (!) 149/74 140/80  Pulse: 81 77  Resp: 18 17  Temp: 36.7 C 36.7 C  SpO2: 95% 98%    Last Pain:  Vitals:   07/08/21 1217  TempSrc: Oral  PainSc: 0-No pain                 Louann Sjogren

## 2021-07-11 ENCOUNTER — Encounter (HOSPITAL_COMMUNITY): Payer: Self-pay | Admitting: Ophthalmology

## 2021-07-23 NOTE — Progress Notes (Signed)
[facility] Summary Report / Loop Recorder 

## 2021-08-02 DIAGNOSIS — N390 Urinary tract infection, site not specified: Secondary | ICD-10-CM | POA: Diagnosis not present

## 2021-08-02 DIAGNOSIS — R319 Hematuria, unspecified: Secondary | ICD-10-CM | POA: Diagnosis not present

## 2021-08-02 DIAGNOSIS — H6122 Impacted cerumen, left ear: Secondary | ICD-10-CM | POA: Diagnosis not present

## 2021-08-05 ENCOUNTER — Ambulatory Visit (INDEPENDENT_AMBULATORY_CARE_PROVIDER_SITE_OTHER): Payer: Medicare Other

## 2021-08-05 DIAGNOSIS — I63531 Cerebral infarction due to unspecified occlusion or stenosis of right posterior cerebral artery: Secondary | ICD-10-CM | POA: Diagnosis not present

## 2021-08-06 LAB — CUP PACEART REMOTE DEVICE CHECK
Date Time Interrogation Session: 20230725071850
Implantable Pulse Generator Implant Date: 20210330

## 2021-08-07 ENCOUNTER — Encounter: Payer: Self-pay | Admitting: Urology

## 2021-08-07 ENCOUNTER — Ambulatory Visit: Payer: Medicare Other | Admitting: Urology

## 2021-08-07 VITALS — BP 124/61 | HR 56

## 2021-08-07 DIAGNOSIS — N3289 Other specified disorders of bladder: Secondary | ICD-10-CM | POA: Diagnosis not present

## 2021-08-07 DIAGNOSIS — N329 Bladder disorder, unspecified: Secondary | ICD-10-CM

## 2021-08-07 MED ORDER — CEPHALEXIN 250 MG PO CAPS: 500.0000 mg | ORAL_CAPSULE | Freq: Once | ORAL | Status: DC

## 2021-08-07 NOTE — Progress Notes (Signed)
   08/07/21  CC: followup benign bladder lesion   HPI: Mr Curtis Jenkins is a 86yo here for followup for a benign bladder lesion. He had a UTi last week and is currently on cipro. No dysuria currently. He had gross hematuria last week when he was diagnosed with a UTI.  Blood pressure 124/61, pulse (!) 56. NED. A&Ox3.   No respiratory distress   Abd soft, NT, ND Normal phallus with bilateral descended testicles  Cystoscopy Procedure Note  Patient identification was confirmed, informed consent was obtained, and patient was prepped using Betadine solution.  Lidocaine jelly was administered per urethral meatus.     Pre-Procedure: - Inspection reveals a normal caliber ureteral meatus.  Procedure: The flexible cystoscope was introduced without difficulty - No urethral strictures/lesions are present. - Enlarged prostate  - Normal bladder neck - Bilateral ureteral orifices identified - Bladder mucosa  reveals no ulcers, tumors, or lesions - No bladder stones - No trabeculation  Retroflexion shows 2cm intravesical prostatic protrusion   Post-Procedure: - Patient tolerated the procedure well  Assessment/ Plan: RTC 1 year for cystoscopy  No follow-ups on file.  Nicolette Bang, MD

## 2021-08-07 NOTE — Patient Instructions (Signed)
Urinary Tract Infection, Adult  A urinary tract infection (UTI) is an infection of any part of the urinary tract. The urinary tract includes the kidneys, ureters, bladder, and urethra. These organs make, store, and get rid of urine in the body. An upper UTI affects the ureters and kidneys. A lower UTI affects the bladder and urethra. What are the causes? Most urinary tract infections are caused by bacteria in your genital area around your urethra, where urine leaves your body. These bacteria grow and cause inflammation of your urinary tract. What increases the risk? You are more likely to develop this condition if: You have a urinary catheter that stays in place. You are not able to control when you urinate or have a bowel movement (incontinence). You are male and you: Use a spermicide or diaphragm for birth control. Have low estrogen levels. Are pregnant. You have certain genes that increase your risk. You are sexually active. You take antibiotic medicines. You have a condition that causes your flow of urine to slow down, such as: An enlarged prostate, if you are male. Blockage in your urethra. A kidney stone. A nerve condition that affects your bladder control (neurogenic bladder). Not getting enough to drink, or not urinating often. You have certain medical conditions, such as: Diabetes. A weak disease-fighting system (immunesystem). Sickle cell disease. Gout. Spinal cord injury. What are the signs or symptoms? Symptoms of this condition include: Needing to urinate right away (urgency). Frequent urination. This may include small amounts of urine each time you urinate. Pain or burning with urination. Blood in the urine. Urine that smells bad or unusual. Trouble urinating. Cloudy urine. Vaginal discharge, if you are male. Pain in the abdomen or the lower back. You may also have: Vomiting or a decreased appetite. Confusion. Irritability or tiredness. A fever or  chills. Diarrhea. The first symptom in older adults may be confusion. In some cases, they may not have any symptoms until the infection has worsened. How is this diagnosed? This condition is diagnosed based on your medical history and a physical exam. You may also have other tests, including: Urine tests. Blood tests. Tests for STIs (sexually transmitted infections). If you have had more than one UTI, a cystoscopy or imaging studies may be done to determine the cause of the infections. How is this treated? Treatment for this condition includes: Antibiotic medicine. Over-the-counter medicines to treat discomfort. Drinking enough water to stay hydrated. If you have frequent infections or have other conditions such as a kidney stone, you may need to see a health care provider who specializes in the urinary tract (urologist). In rare cases, urinary tract infections can cause sepsis. Sepsis is a life-threatening condition that occurs when the body responds to an infection. Sepsis is treated in the hospital with IV antibiotics, fluids, and other medicines. Follow these instructions at home:  Medicines Take over-the-counter and prescription medicines only as told by your health care provider. If you were prescribed an antibiotic medicine, take it as told by your health care provider. Do not stop using the antibiotic even if you start to feel better. General instructions Make sure you: Empty your bladder often and completely. Do not hold urine for long periods of time. Empty your bladder after sex. Wipe from front to back after urinating or having a bowel movement if you are male. Use each tissue only one time when you wipe. Drink enough fluid to keep your urine pale yellow. Keep all follow-up visits. This is important. Contact a health   care provider if: Your symptoms do not get better after 1-2 days. Your symptoms go away and then return. Get help right away if: You have severe pain in  your back or your lower abdomen. You have a fever or chills. You have nausea or vomiting. Summary A urinary tract infection (UTI) is an infection of any part of the urinary tract, which includes the kidneys, ureters, bladder, and urethra. Most urinary tract infections are caused by bacteria in your genital area. Treatment for this condition often includes antibiotic medicines. If you were prescribed an antibiotic medicine, take it as told by your health care provider. Do not stop using the antibiotic even if you start to feel better. Keep all follow-up visits. This is important. This information is not intended to replace advice given to you by your health care provider. Make sure you discuss any questions you have with your health care provider. Document Revised: 08/12/2019 Document Reviewed: 08/12/2019 Elsevier Patient Education  2023 Elsevier Inc.  

## 2021-08-12 DIAGNOSIS — I63531 Cerebral infarction due to unspecified occlusion or stenosis of right posterior cerebral artery: Secondary | ICD-10-CM | POA: Diagnosis not present

## 2021-08-12 DIAGNOSIS — F01518 Vascular dementia, unspecified severity, with other behavioral disturbance: Secondary | ICD-10-CM | POA: Diagnosis not present

## 2021-08-12 DIAGNOSIS — N184 Chronic kidney disease, stage 4 (severe): Secondary | ICD-10-CM | POA: Diagnosis not present

## 2021-08-12 DIAGNOSIS — Z515 Encounter for palliative care: Secondary | ICD-10-CM | POA: Diagnosis not present

## 2021-09-03 ENCOUNTER — Encounter: Payer: Self-pay | Admitting: *Deleted

## 2021-09-03 ENCOUNTER — Other Ambulatory Visit: Payer: Self-pay | Admitting: *Deleted

## 2021-09-03 NOTE — Patient Outreach (Signed)
  Care Coordination   Initial Visit Note   09/03/2021 Name: Curtis Jenkins MRN: [id number] DOB: 28-Jul-1935  NAJIR ROOP is a 86 y.o. year old male who sees [employer], MD for primary care. I spoke with [name], daughter of Curtis Jenkins by phone today  What matters to the patients health and wellness today?  Daughter report member's condition is stable at this time.  Has vascular dementia, member lives with wife but has friends sit with him during the day as wife works.  Daughter lives next door.  He is unable to walk, but can stand to pivot.  He has DME in the home [facility], [facility], and wheelchair), daughter denies needing more at this time.  She will discuss if AWV was completed, will have PCP office schedule if not.     Goals Addressed             This Visit's Progress    COMPLETED: Care Coordination Activities - No follow up needed       Care Coordination Interventions: Evaluation of current treatment plan related to Vascular Dementia and patient's adherence to plan as established by provider Reviewed scheduled/upcoming provider appointments including need for AWV Assessed social determinant of health barriers         SDOH assessments and interventions completed:  Yes  SDOH Interventions Today    Flowsheet Row Most Recent Value  SDOH Interventions   Food Insecurity Interventions Intervention Not Indicated  Financial Strain Interventions Intervention Not Indicated  Transportation Interventions Intervention Not Indicated        Care Coordination Interventions Activated:  Yes  Care Coordination Interventions:  Yes, provided   Follow up plan: No further intervention required.   Encounter Outcome:  Pt. Visit Completed   Valente David, RN, MSN, [facility] Care Coordinator [phone]

## 2021-09-03 NOTE — Patient Instructions (Signed)
Visit Information  Thank you for taking time to visit with me today. Please don't hesitate to contact me if I can be of assistance to you before our next scheduled telephone appointment.  Patient verbalizes understanding of instructions and care plan provided today and agrees to view in [place]. Active MyChart status and patient understanding of how to access instructions and care plan via MyChart confirmed with patient.     The patient has been provided with contact information for the care management team and has been advised to call with any health related questions or concerns.  No further follow up required: Daughter will call with questions/concerns  Valente David, RN, MSN, [place] Coordinator [phone]

## 2021-09-09 ENCOUNTER — Ambulatory Visit (INDEPENDENT_AMBULATORY_CARE_PROVIDER_SITE_OTHER): Payer: Medicare Other

## 2021-09-09 DIAGNOSIS — I63531 Cerebral infarction due to unspecified occlusion or stenosis of right posterior cerebral artery: Secondary | ICD-10-CM

## 2021-09-09 NOTE — Progress Notes (Signed)
[facility] Summary Report / Loop Recorder 

## 2021-09-10 LAB — CUP PACEART REMOTE DEVICE CHECK
Date Time Interrogation Session: 20230829075840
Implantable Pulse Generator Implant Date: 20210330

## 2021-09-27 DIAGNOSIS — F01518 Vascular dementia, unspecified severity, with other behavioral disturbance: Secondary | ICD-10-CM | POA: Diagnosis not present

## 2021-09-27 DIAGNOSIS — Z515 Encounter for palliative care: Secondary | ICD-10-CM | POA: Diagnosis not present

## 2021-09-27 DIAGNOSIS — N184 Chronic kidney disease, stage 4 (severe): Secondary | ICD-10-CM | POA: Diagnosis not present

## 2021-09-27 DIAGNOSIS — I63531 Cerebral infarction due to unspecified occlusion or stenosis of right posterior cerebral artery: Secondary | ICD-10-CM | POA: Diagnosis not present

## 2021-10-03 NOTE — Progress Notes (Signed)
[facility] Summary Report / Loop Recorder 

## 2021-10-14 ENCOUNTER — Ambulatory Visit (INDEPENDENT_AMBULATORY_CARE_PROVIDER_SITE_OTHER): Payer: Medicare Other

## 2021-10-14 DIAGNOSIS — I63531 Cerebral infarction due to unspecified occlusion or stenosis of right posterior cerebral artery: Secondary | ICD-10-CM | POA: Diagnosis not present

## 2021-10-15 LAB — CUP PACEART REMOTE DEVICE CHECK
Date Time Interrogation Session: 20231003112629
Implantable Pulse Generator Implant Date: 20210330

## 2021-10-29 NOTE — Progress Notes (Signed)
[facility] Summary Report / Loop Recorder 

## 2021-11-12 DIAGNOSIS — N184 Chronic kidney disease, stage 4 (severe): Secondary | ICD-10-CM | POA: Diagnosis not present

## 2021-11-12 DIAGNOSIS — Z515 Encounter for palliative care: Secondary | ICD-10-CM | POA: Diagnosis not present

## 2021-11-12 DIAGNOSIS — I63531 Cerebral infarction due to unspecified occlusion or stenosis of right posterior cerebral artery: Secondary | ICD-10-CM | POA: Diagnosis not present

## 2021-11-12 DIAGNOSIS — F01518 Vascular dementia, unspecified severity, with other behavioral disturbance: Secondary | ICD-10-CM | POA: Diagnosis not present

## 2021-11-18 ENCOUNTER — Ambulatory Visit (INDEPENDENT_AMBULATORY_CARE_PROVIDER_SITE_OTHER): Payer: Medicare Other

## 2021-11-18 DIAGNOSIS — I63531 Cerebral infarction due to unspecified occlusion or stenosis of right posterior cerebral artery: Secondary | ICD-10-CM | POA: Diagnosis not present

## 2021-11-19 LAB — CUP PACEART REMOTE DEVICE CHECK
Date Time Interrogation Session: 20231103230709
Implantable Pulse Generator Implant Date: 20210330

## 2021-12-20 NOTE — Progress Notes (Signed)
[facility] Summary Report / Loop Recorder 

## 2021-12-23 ENCOUNTER — Ambulatory Visit (INDEPENDENT_AMBULATORY_CARE_PROVIDER_SITE_OTHER): Payer: Medicare Other

## 2021-12-23 DIAGNOSIS — I63531 Cerebral infarction due to unspecified occlusion or stenosis of right posterior cerebral artery: Secondary | ICD-10-CM | POA: Diagnosis not present

## 2021-12-24 LAB — CUP PACEART REMOTE DEVICE CHECK
Date Time Interrogation Session: 20231210231318
Implantable Pulse Generator Implant Date: 20210330

## 2022-01-27 ENCOUNTER — Ambulatory Visit (INDEPENDENT_AMBULATORY_CARE_PROVIDER_SITE_OTHER): Payer: Medicare Other

## 2022-01-27 DIAGNOSIS — I63531 Cerebral infarction due to unspecified occlusion or stenosis of right posterior cerebral artery: Secondary | ICD-10-CM | POA: Diagnosis not present

## 2022-01-29 LAB — CUP PACEART REMOTE DEVICE CHECK
Date Time Interrogation Session: 20240112231025
Implantable Pulse Generator Implant Date: 20210330

## 2022-01-30 NOTE — Progress Notes (Signed)
Carelink Summary Report / Loop Recorder

## 2022-01-31 DIAGNOSIS — I700 Atherosclerosis of aorta: Secondary | ICD-10-CM | POA: Diagnosis not present

## 2022-01-31 DIAGNOSIS — E1129 Type 2 diabetes mellitus with other diabetic kidney complication: Secondary | ICD-10-CM | POA: Diagnosis not present

## 2022-01-31 DIAGNOSIS — I6930 Unspecified sequelae of cerebral infarction: Secondary | ICD-10-CM | POA: Diagnosis not present

## 2022-01-31 DIAGNOSIS — G894 Chronic pain syndrome: Secondary | ICD-10-CM | POA: Diagnosis not present

## 2022-02-03 DIAGNOSIS — N184 Chronic kidney disease, stage 4 (severe): Secondary | ICD-10-CM | POA: Diagnosis not present

## 2022-02-03 DIAGNOSIS — Z515 Encounter for palliative care: Secondary | ICD-10-CM | POA: Diagnosis not present

## 2022-02-03 DIAGNOSIS — F01518 Vascular dementia, unspecified severity, with other behavioral disturbance: Secondary | ICD-10-CM | POA: Diagnosis not present

## 2022-02-03 DIAGNOSIS — I63531 Cerebral infarction due to unspecified occlusion or stenosis of right posterior cerebral artery: Secondary | ICD-10-CM | POA: Diagnosis not present

## 2022-02-26 LAB — CUP PACEART REMOTE DEVICE CHECK
Date Time Interrogation Session: 20240214230920
Implantable Pulse Generator Implant Date: 20210330

## 2022-03-03 ENCOUNTER — Ambulatory Visit (INDEPENDENT_AMBULATORY_CARE_PROVIDER_SITE_OTHER): Payer: Medicare Other

## 2022-03-03 DIAGNOSIS — I63531 Cerebral infarction due to unspecified occlusion or stenosis of right posterior cerebral artery: Secondary | ICD-10-CM

## 2022-03-11 DIAGNOSIS — F01518 Vascular dementia, unspecified severity, with other behavioral disturbance: Secondary | ICD-10-CM | POA: Diagnosis not present

## 2022-03-11 DIAGNOSIS — N184 Chronic kidney disease, stage 4 (severe): Secondary | ICD-10-CM | POA: Diagnosis not present

## 2022-03-11 DIAGNOSIS — I69354 Hemiplegia and hemiparesis following cerebral infarction affecting left non-dominant side: Secondary | ICD-10-CM | POA: Diagnosis not present

## 2022-03-11 DIAGNOSIS — Z515 Encounter for palliative care: Secondary | ICD-10-CM | POA: Diagnosis not present

## 2022-03-11 DIAGNOSIS — I63531 Cerebral infarction due to unspecified occlusion or stenosis of right posterior cerebral artery: Secondary | ICD-10-CM | POA: Diagnosis not present

## 2022-03-11 NOTE — Progress Notes (Signed)
[facility] Summary Report / Loop Recorder 

## 2022-04-07 ENCOUNTER — Ambulatory Visit: Payer: Medicare Other | Attending: Internal Medicine

## 2022-04-07 DIAGNOSIS — I63531 Cerebral infarction due to unspecified occlusion or stenosis of right posterior cerebral artery: Secondary | ICD-10-CM

## 2022-04-07 LAB — CUP PACEART REMOTE DEVICE CHECK
Date Time Interrogation Session: 20240324231406
Implantable Pulse Generator Implant Date: 20210330

## 2022-04-16 NOTE — Progress Notes (Signed)
[facility] Summary Report / Loop Recorder 

## 2022-04-25 DIAGNOSIS — Z515 Encounter for palliative care: Secondary | ICD-10-CM | POA: Diagnosis not present

## 2022-04-25 DIAGNOSIS — I63531 Cerebral infarction due to unspecified occlusion or stenosis of right posterior cerebral artery: Secondary | ICD-10-CM | POA: Diagnosis not present

## 2022-04-25 DIAGNOSIS — I69354 Hemiplegia and hemiparesis following cerebral infarction affecting left non-dominant side: Secondary | ICD-10-CM | POA: Diagnosis not present

## 2022-04-25 DIAGNOSIS — F01518 Vascular dementia, unspecified severity, with other behavioral disturbance: Secondary | ICD-10-CM | POA: Diagnosis not present

## 2022-04-25 DIAGNOSIS — N184 Chronic kidney disease, stage 4 (severe): Secondary | ICD-10-CM | POA: Diagnosis not present

## 2022-05-02 IMAGING — DX DG OS CALCIS 2+V*L*
3 series · 3 of 3 positions shown · non-contrast
Comparison: No prior.

CLINICAL DATA: Pressure ulcer left heel.

EXAM:
LEFT OS CALCIS - 2+ VIEW

[calcaneus axial (1 of 2)]
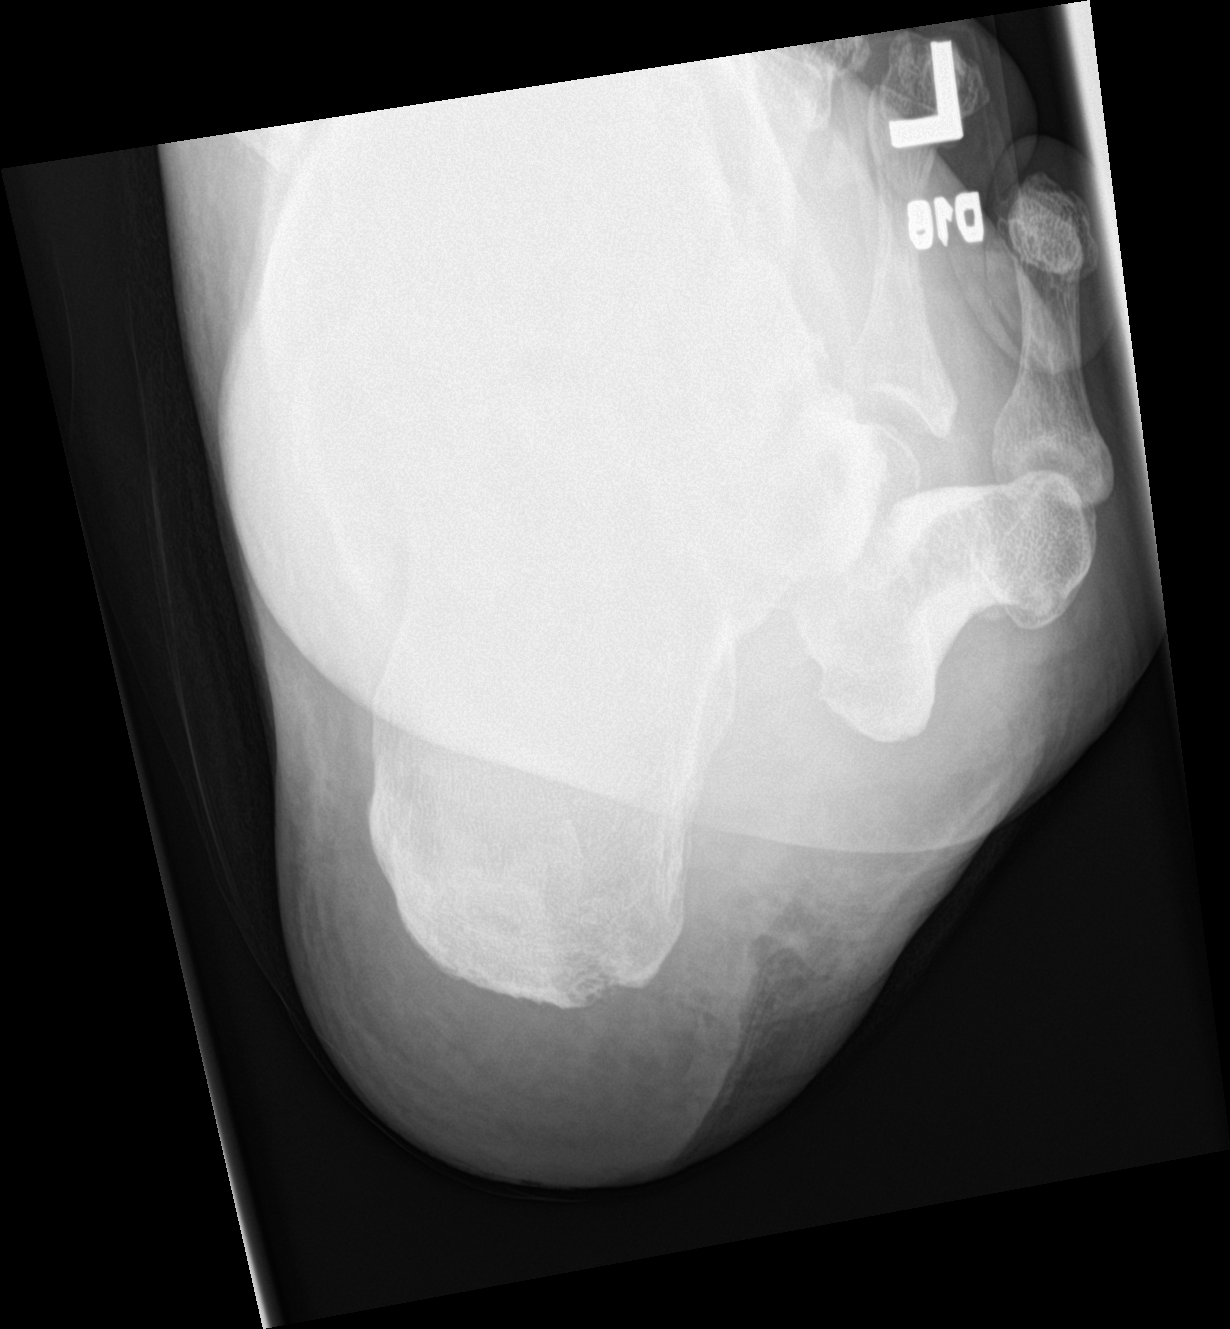

[calcaneus lat]
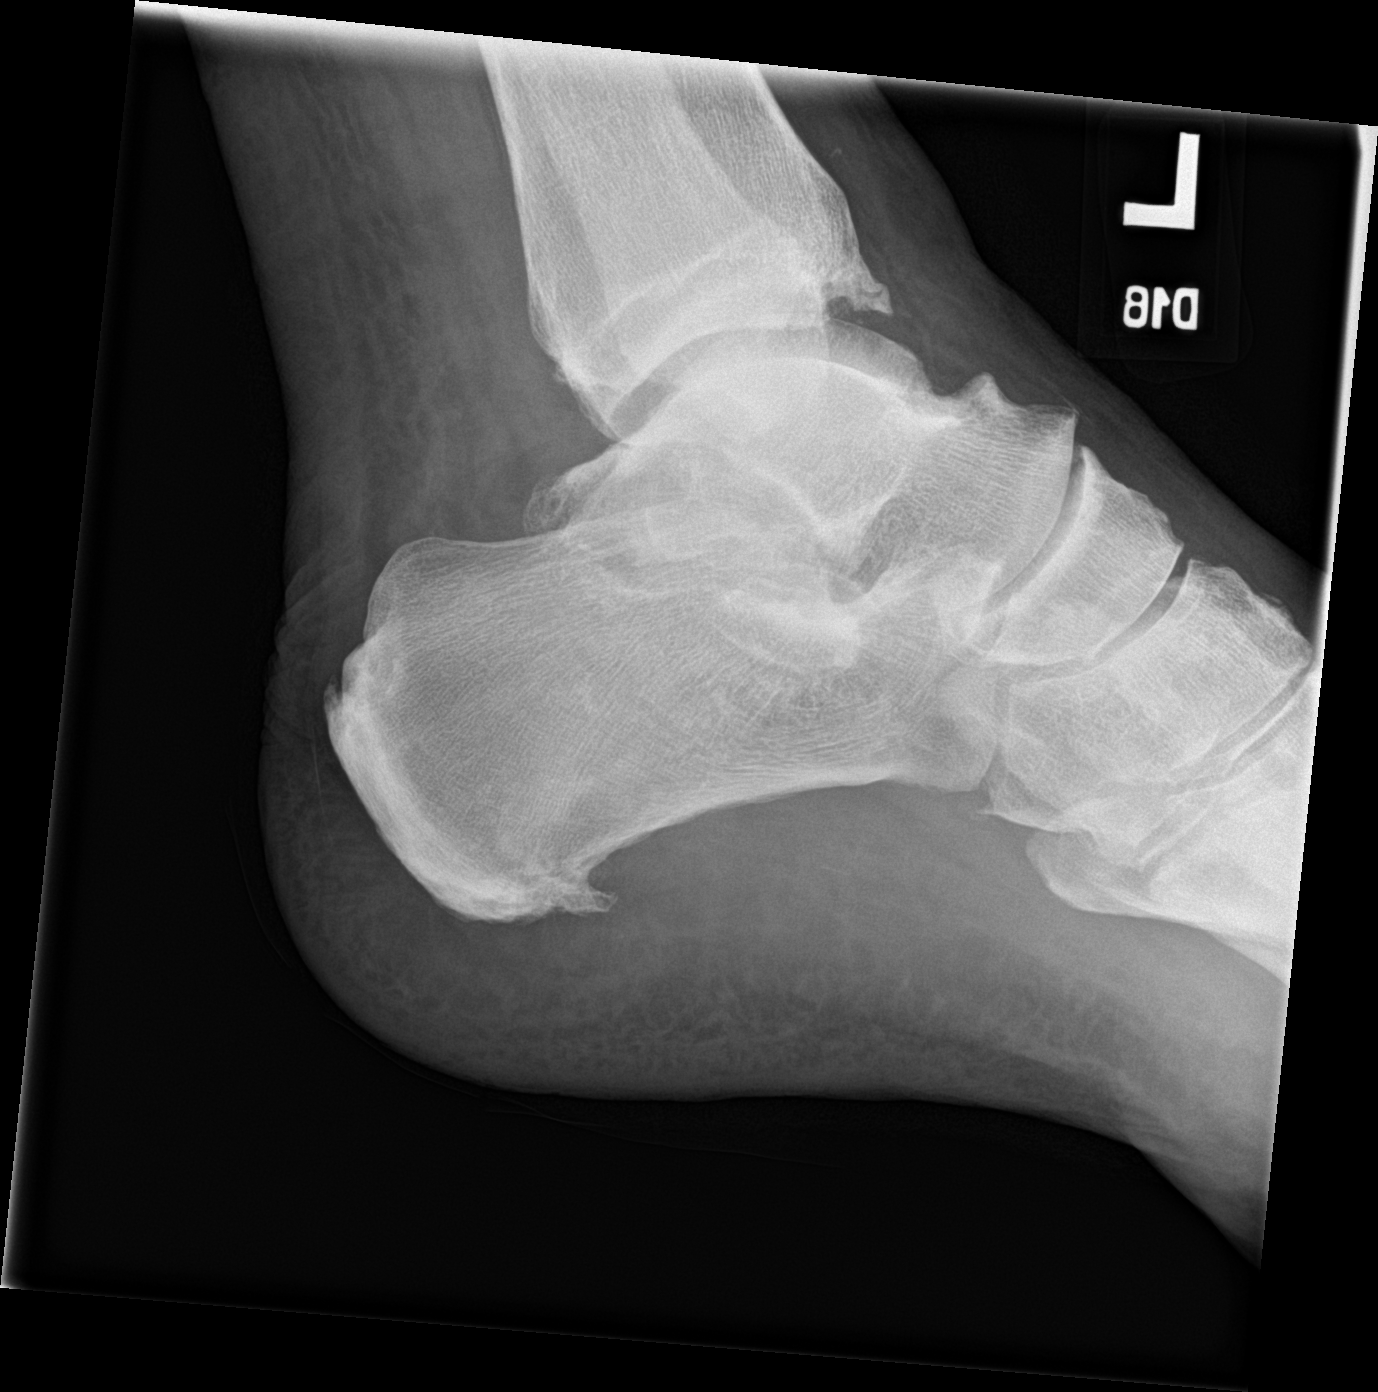

[calcaneus axial (2 of 2)]
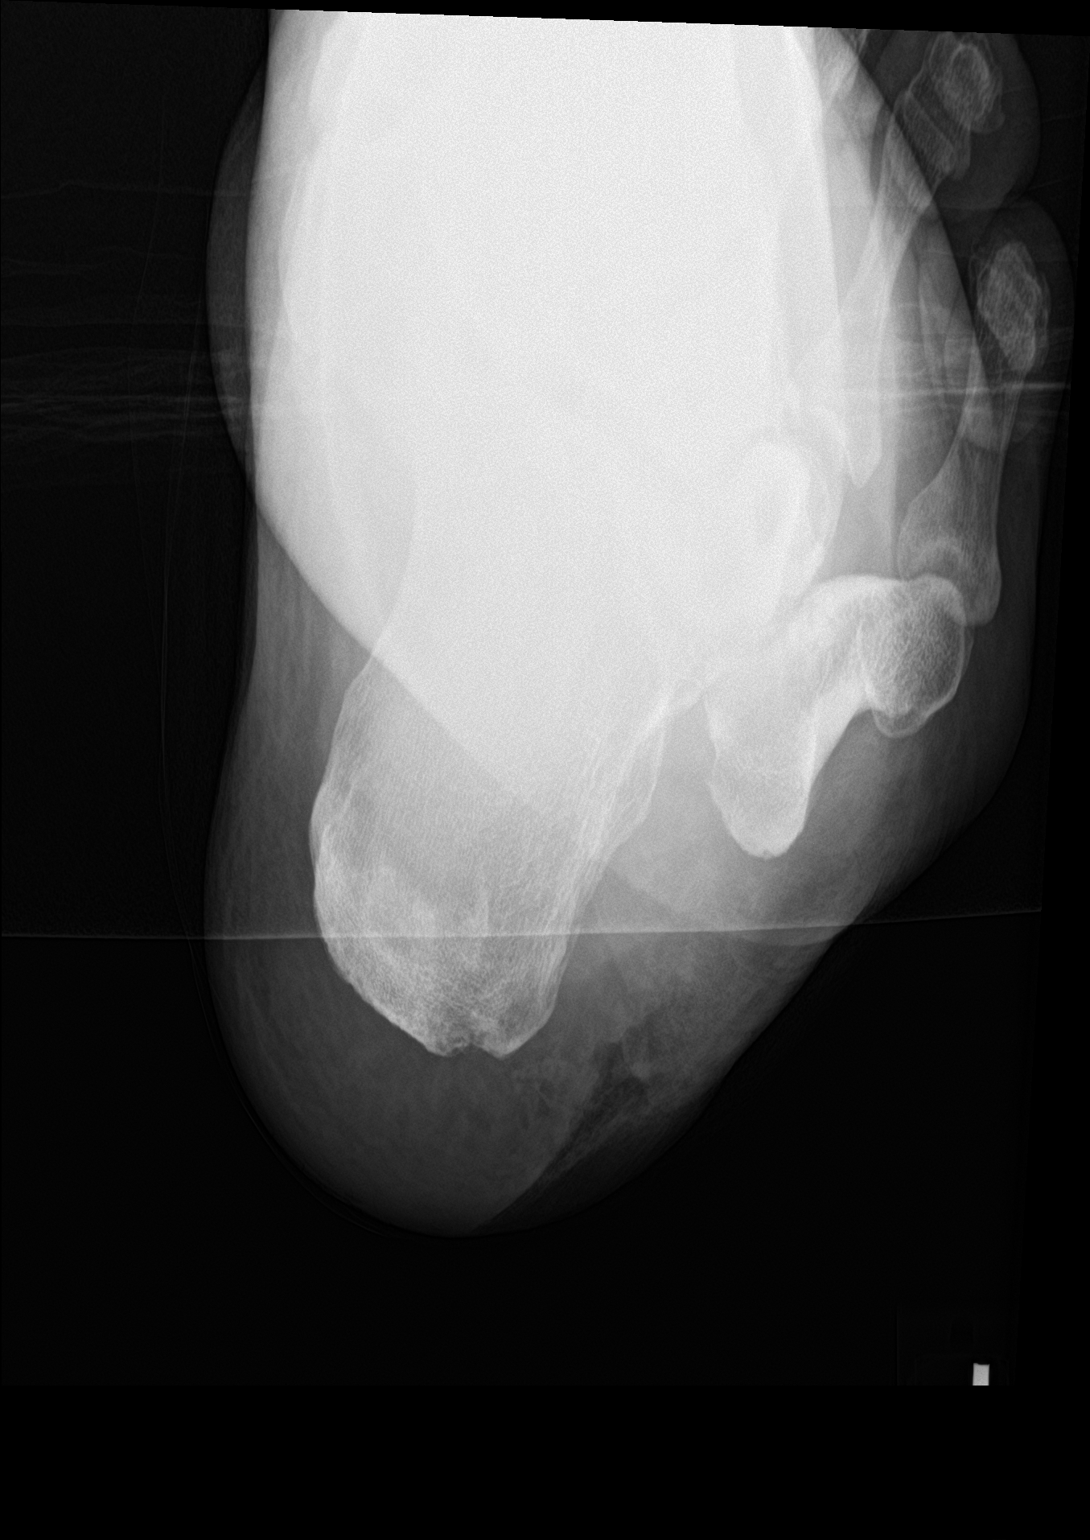

[3 of 3 positions shown; findings below may reference images not displayed]

FINDINGS: Soft tissue swelling and ulceration noted along the posterior aspect
of the calcaneus. Posterior calcaneal erosion cannot be excluded.
Osteomyelitis could present this fashion. Further evaluation with
MRI can be obtained as needed. No evidence of fracture or
dislocation. Diffuse degenerative change. No radiopaque foreign
body.
IMPRESSION: Soft tissue swelling and ulceration noted along the posterior aspect
of the calcaneus. Posterior calcaneal erosion cannot be excluded.
Osteomyelitis cannot be excluded. MRI can be obtained for further
evaluation is needed.

## 2022-05-12 ENCOUNTER — Ambulatory Visit (INDEPENDENT_AMBULATORY_CARE_PROVIDER_SITE_OTHER): Payer: Medicare Other

## 2022-05-12 DIAGNOSIS — I63531 Cerebral infarction due to unspecified occlusion or stenosis of right posterior cerebral artery: Secondary | ICD-10-CM | POA: Diagnosis not present

## 2022-05-12 LAB — CUP PACEART REMOTE DEVICE CHECK
Date Time Interrogation Session: 20240426230250
Implantable Pulse Generator Implant Date: 20210330

## 2022-05-19 NOTE — Progress Notes (Signed)
[facility] Summary Report / Loop Recorder 

## 2022-05-21 IMAGING — MR MR FOOT*L* W/O CM
4 of 7 series · 19 of 40 positions shown · non-contrast
Comparison: Plain films of the calcaneus 06/27/2019.

CLINICAL DATA: Skin ulceration on the left heel. Question
osteomyelitis.

EXAM:
MRI OF THE LEFT FOOT WITHOUT CONTRAST
TECHNIQUE: Multiplanar, multisequence MR imaging of the left forefoot was
performed. No intravenous contrast was administered.

[Series 4: T2 fat-sat · axial · 3.0mm · 0.33mm/px · z∈[-20,+86]mm · 3 of 44 slices shown (1 of 2)]
[im 8/44]
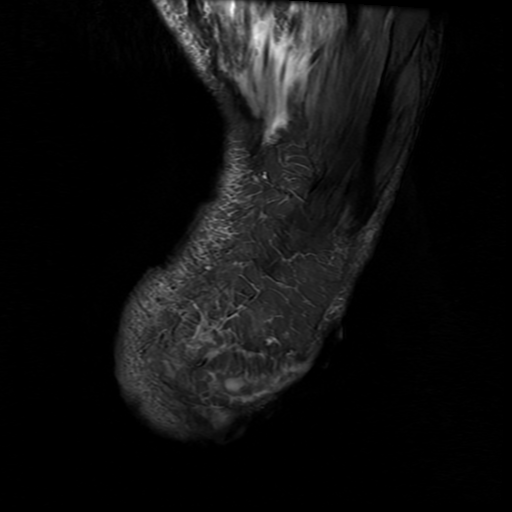
[im 22/44]
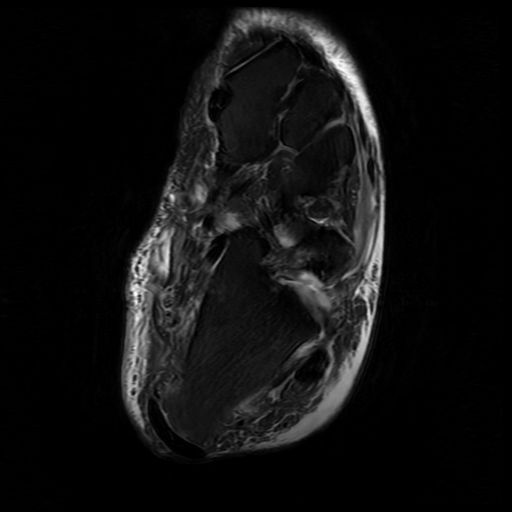
[im 36/44]
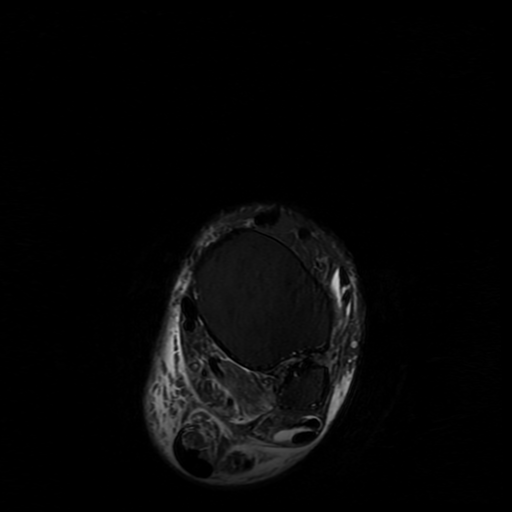

[Series 7: PD fat-sat · axial · 3.0mm · 0.33mm/px · z∈[-46,+116]mm · 7 of 44 slices shown]
[im 1/44]
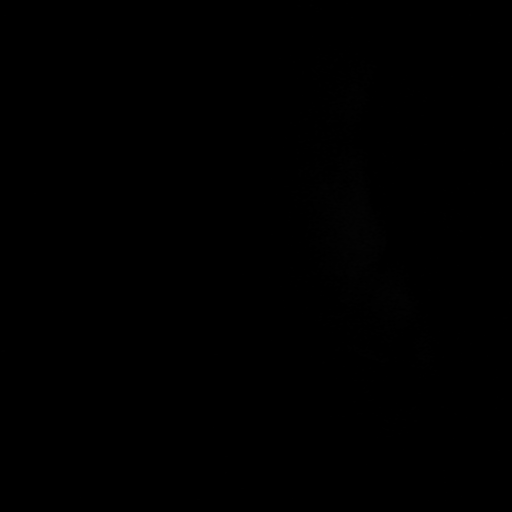
[im 8/44]
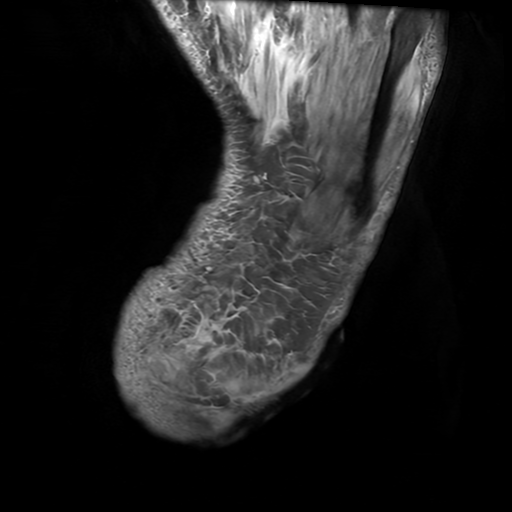
[im 15/44]
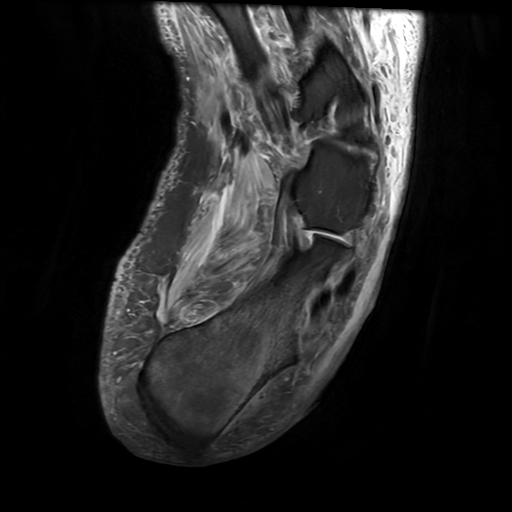
[im 22/44]
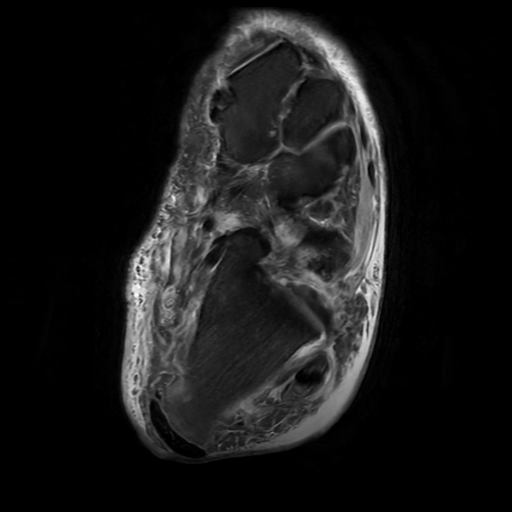
[im 29/44]
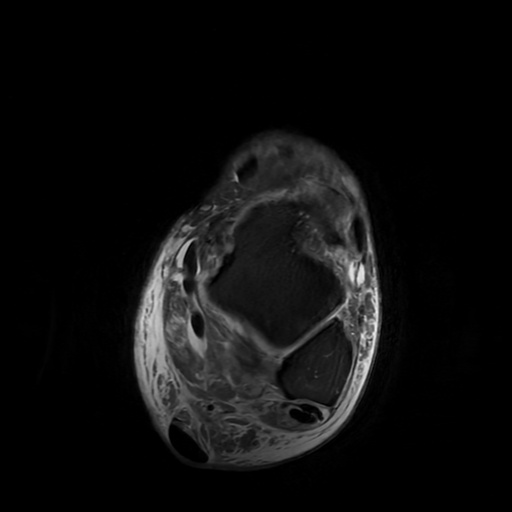
[im 36/44]
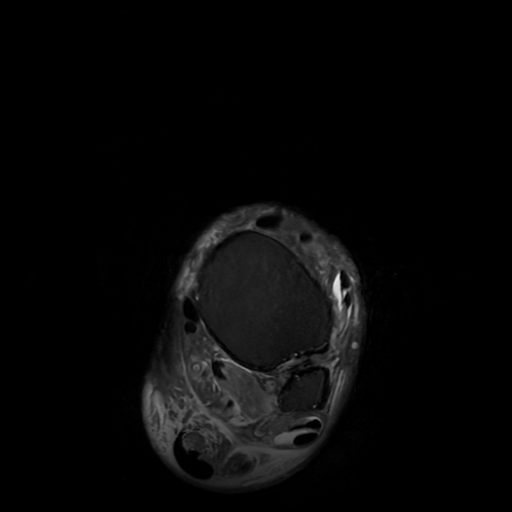
[im 44/44]
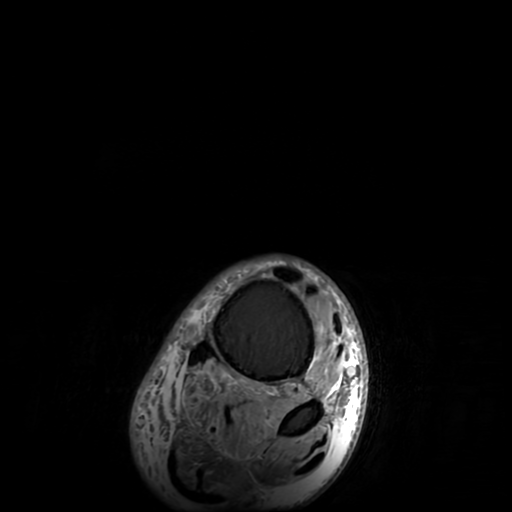

[Series 8: PD · axial · 3.0mm · 0.33mm/px · z∈[-46,+86]mm · 6 of 44 slices shown]
[im 1/44]
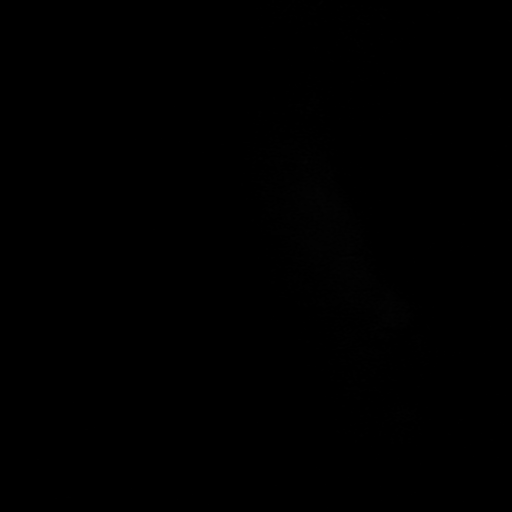
[im 8/44]
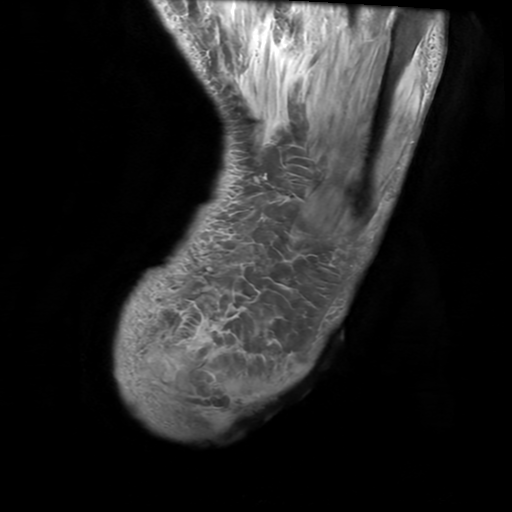
[im 15/44]
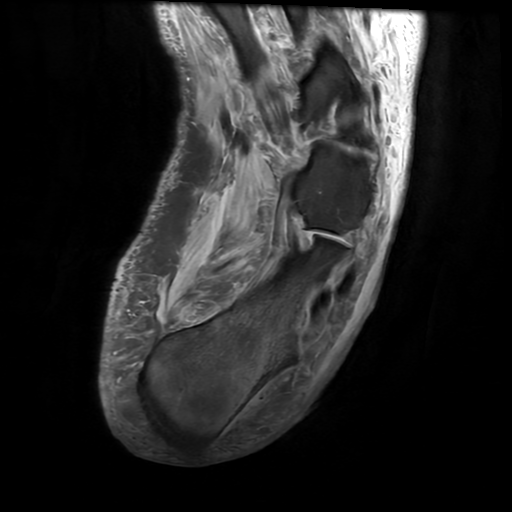
[im 22/44]
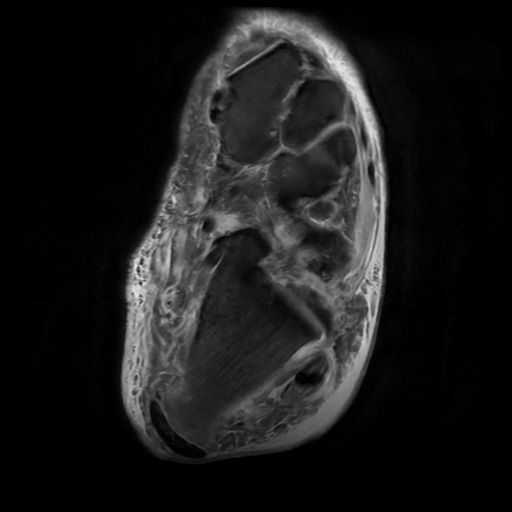
[im 29/44]
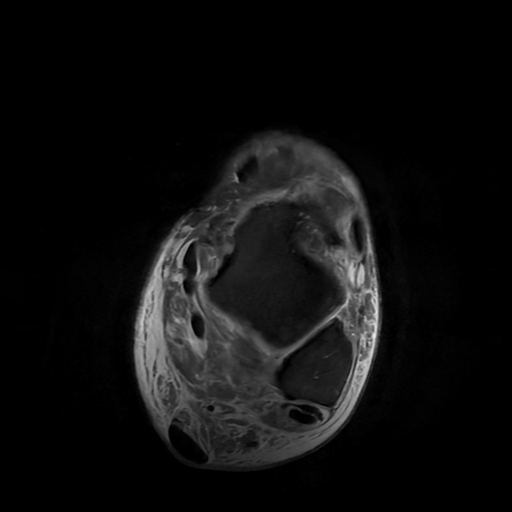
[im 36/44]
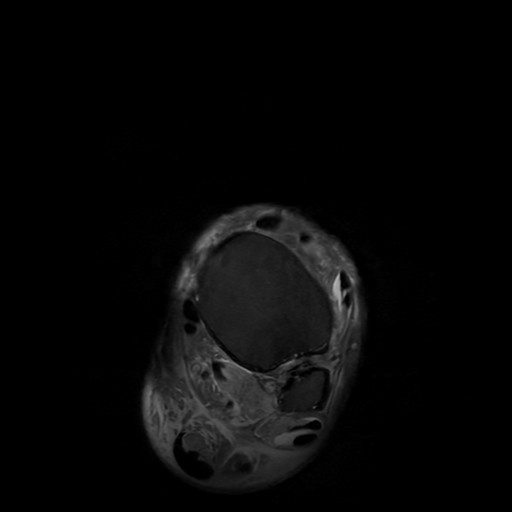

[Series 10: T2 fat-sat · coronal · 3.0mm · 0.35mm/px · 3 of 53 slices shown (2 of 2)]
[im 8/53]
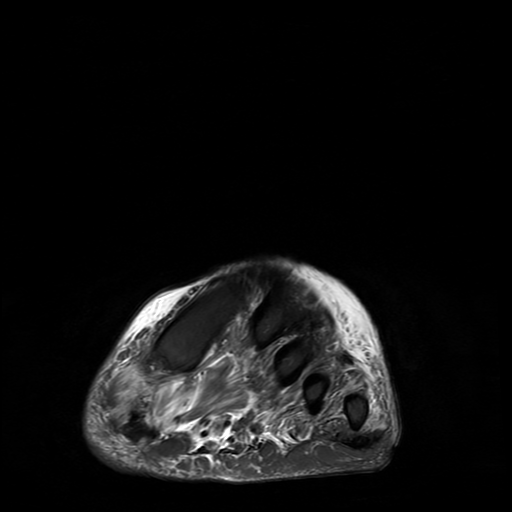
[im 30/53]
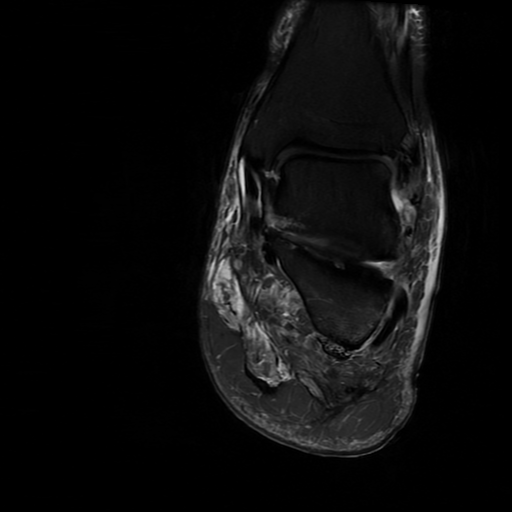
[im 45/53]
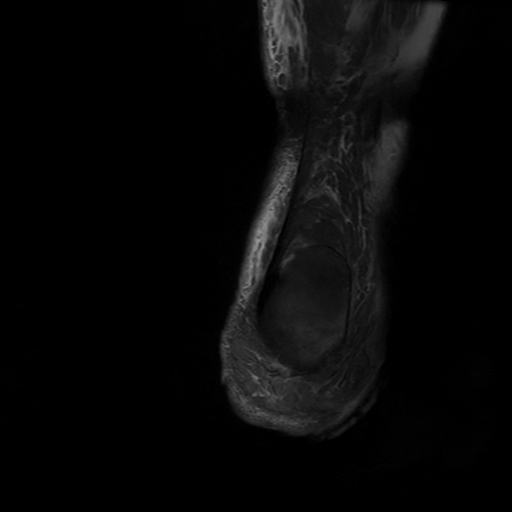

[19 of 40 positions shown; findings below may reference images not displayed]

FINDINGS: Bones/Joint/Cartilage

Marrow edema in the posterior, inferior calcaneus extending 5 cm
anteriorly and approximately 2 cm craniocaudal is consistent
osteomyelitis. Edema is deep to the skin ulceration seen on the
prior plain films. No other evidence of osteomyelitis. No fracture
or dislocation. Subtalar degenerative disease is noted.

Ligaments

Chronic tear of the anterior talofibular ligament is identified.

Muscles and Tendons

Short segment longitudinal split tear of the peroneus brevis is
identified. There is a small volume of fluid in the posteromedial
and peroneal tendons.

Soft tissues

Diffuse subcutaneous edema is present. No focal fluid collection.
Skin ulceration on the heel identified.
IMPRESSION: Skin ulcer on the heel with osteomyelitis in the underlying
calcaneus as described above. Negative for abscess or septic joint.

Subcutaneous edema diffusely is worst over the dorsum of the foot
and could be due to cellulitis or dependent change.

Chronic ATFL tear.

Small volume of fluid in the sheaths of the peroneal and
posteromedial tendons is likely incidental. Short segment
longitudinal split tear of the peroneus brevis is noted.

## 2022-06-11 ENCOUNTER — Ambulatory Visit (INDEPENDENT_AMBULATORY_CARE_PROVIDER_SITE_OTHER): Payer: Medicare Other

## 2022-06-11 DIAGNOSIS — I63531 Cerebral infarction due to unspecified occlusion or stenosis of right posterior cerebral artery: Secondary | ICD-10-CM

## 2022-06-12 LAB — CUP PACEART REMOTE DEVICE CHECK
Date Time Interrogation Session: 20240529230642
Implantable Pulse Generator Implant Date: 20210330

## 2022-06-12 NOTE — Progress Notes (Signed)
[facility] Summary Report / Loop Recorder 

## 2022-06-13 DIAGNOSIS — Z515 Encounter for palliative care: Secondary | ICD-10-CM | POA: Diagnosis not present

## 2022-06-13 DIAGNOSIS — N184 Chronic kidney disease, stage 4 (severe): Secondary | ICD-10-CM | POA: Diagnosis not present

## 2022-07-03 NOTE — Progress Notes (Signed)
[facility] Summary Report / Loop Recorder 

## 2022-07-14 ENCOUNTER — Ambulatory Visit (INDEPENDENT_AMBULATORY_CARE_PROVIDER_SITE_OTHER): Payer: Medicare Other

## 2022-07-14 DIAGNOSIS — I63531 Cerebral infarction due to unspecified occlusion or stenosis of right posterior cerebral artery: Secondary | ICD-10-CM

## 2022-07-15 LAB — CUP PACEART REMOTE DEVICE CHECK
Date Time Interrogation Session: 20240701230231
Implantable Pulse Generator Implant Date: 20210330

## 2022-07-30 ENCOUNTER — Ambulatory Visit: Payer: Medicare Other | Admitting: Urology

## 2022-07-31 NOTE — Progress Notes (Signed)
[facility] Summary Report / Loop Recorder 

## 2022-08-17 LAB — CUP PACEART REMOTE DEVICE CHECK
Date Time Interrogation Session: 20240803230257
Implantable Pulse Generator Implant Date: 20210330

## 2022-08-18 ENCOUNTER — Ambulatory Visit (INDEPENDENT_AMBULATORY_CARE_PROVIDER_SITE_OTHER): Payer: Medicare Other

## 2022-08-18 DIAGNOSIS — I63531 Cerebral infarction due to unspecified occlusion or stenosis of right posterior cerebral artery: Secondary | ICD-10-CM | POA: Diagnosis not present

## 2022-08-22 DIAGNOSIS — Z515 Encounter for palliative care: Secondary | ICD-10-CM | POA: Diagnosis not present

## 2022-08-22 DIAGNOSIS — N184 Chronic kidney disease, stage 4 (severe): Secondary | ICD-10-CM | POA: Diagnosis not present

## 2022-09-01 NOTE — Progress Notes (Signed)
[facility] Summary Report / Loop Recorder 

## 2022-09-19 LAB — CUP PACEART REMOTE DEVICE CHECK
Date Time Interrogation Session: 20240905230404
Implantable Pulse Generator Implant Date: 20210330

## 2022-09-22 ENCOUNTER — Ambulatory Visit (INDEPENDENT_AMBULATORY_CARE_PROVIDER_SITE_OTHER): Payer: Medicare Other

## 2022-09-22 DIAGNOSIS — I63531 Cerebral infarction due to unspecified occlusion or stenosis of right posterior cerebral artery: Secondary | ICD-10-CM | POA: Diagnosis not present

## 2022-10-06 DIAGNOSIS — E1129 Type 2 diabetes mellitus with other diabetic kidney complication: Secondary | ICD-10-CM | POA: Diagnosis not present

## 2022-10-06 DIAGNOSIS — I6930 Unspecified sequelae of cerebral infarction: Secondary | ICD-10-CM | POA: Diagnosis not present

## 2022-10-06 DIAGNOSIS — G894 Chronic pain syndrome: Secondary | ICD-10-CM | POA: Diagnosis not present

## 2022-10-06 DIAGNOSIS — N184 Chronic kidney disease, stage 4 (severe): Secondary | ICD-10-CM | POA: Diagnosis not present

## 2022-10-09 NOTE — Progress Notes (Signed)
[facility] Summary Report / Loop Recorder 

## 2022-10-14 DIAGNOSIS — N184 Chronic kidney disease, stage 4 (severe): Secondary | ICD-10-CM | POA: Diagnosis not present

## 2022-10-14 DIAGNOSIS — Z515 Encounter for palliative care: Secondary | ICD-10-CM | POA: Diagnosis not present

## 2022-10-14 DIAGNOSIS — I69354 Hemiplegia and hemiparesis following cerebral infarction affecting left non-dominant side: Secondary | ICD-10-CM | POA: Diagnosis not present

## 2022-10-14 DIAGNOSIS — L03116 Cellulitis of left lower limb: Secondary | ICD-10-CM | POA: Diagnosis not present

## 2022-10-14 DIAGNOSIS — I63531 Cerebral infarction due to unspecified occlusion or stenosis of right posterior cerebral artery: Secondary | ICD-10-CM | POA: Diagnosis not present

## 2022-10-17 ENCOUNTER — Encounter (HOSPITAL_COMMUNITY): Payer: Self-pay

## 2022-10-17 ENCOUNTER — Emergency Department (HOSPITAL_COMMUNITY): Payer: Medicare Other

## 2022-10-17 ENCOUNTER — Other Ambulatory Visit: Payer: Self-pay

## 2022-10-17 ENCOUNTER — Inpatient Hospital Stay (HOSPITAL_COMMUNITY)
Admission: EM | Admit: 2022-10-17 | Discharge: 2022-10-20 | DRG: 872 | Disposition: A | Discharge status: Home Health | Payer: Medicare Other | Attending: Internal Medicine | Admitting: Internal Medicine

## 2022-10-17 DIAGNOSIS — R6520 Severe sepsis without septic shock: Secondary | ICD-10-CM | POA: Diagnosis not present

## 2022-10-17 DIAGNOSIS — I129 Hypertensive chronic kidney disease with stage 1 through stage 4 chronic kidney disease, or unspecified chronic kidney disease: Secondary | ICD-10-CM | POA: Diagnosis not present

## 2022-10-17 DIAGNOSIS — R6521 Severe sepsis with septic shock: Secondary | ICD-10-CM | POA: Diagnosis not present

## 2022-10-17 DIAGNOSIS — I69391 Dysphagia following cerebral infarction: Secondary | ICD-10-CM

## 2022-10-17 DIAGNOSIS — L0390 Cellulitis, unspecified: Secondary | ICD-10-CM | POA: Insufficient documentation

## 2022-10-17 DIAGNOSIS — I69354 Hemiplegia and hemiparesis following cerebral infarction affecting left non-dominant side: Secondary | ICD-10-CM

## 2022-10-17 DIAGNOSIS — R531 Weakness: Secondary | ICD-10-CM | POA: Diagnosis not present

## 2022-10-17 DIAGNOSIS — M7732 Calcaneal spur, left foot: Secondary | ICD-10-CM | POA: Diagnosis not present

## 2022-10-17 DIAGNOSIS — Z7901 Long term (current) use of anticoagulants: Secondary | ICD-10-CM | POA: Diagnosis not present

## 2022-10-17 DIAGNOSIS — I10 Essential (primary) hypertension: Secondary | ICD-10-CM | POA: Diagnosis not present

## 2022-10-17 DIAGNOSIS — Z66 Do not resuscitate: Secondary | ICD-10-CM | POA: Diagnosis present

## 2022-10-17 DIAGNOSIS — N1831 Chronic kidney disease, stage 3a: Secondary | ICD-10-CM | POA: Diagnosis not present

## 2022-10-17 DIAGNOSIS — Z79899 Other long term (current) drug therapy: Secondary | ICD-10-CM | POA: Diagnosis not present

## 2022-10-17 DIAGNOSIS — N179 Acute kidney failure, unspecified: Secondary | ICD-10-CM | POA: Diagnosis not present

## 2022-10-17 DIAGNOSIS — I480 Paroxysmal atrial fibrillation: Secondary | ICD-10-CM | POA: Diagnosis present

## 2022-10-17 DIAGNOSIS — L89322 Pressure ulcer of left buttock, stage 2: Secondary | ICD-10-CM | POA: Diagnosis present

## 2022-10-17 DIAGNOSIS — M2042 Other hammer toe(s) (acquired), left foot: Secondary | ICD-10-CM | POA: Diagnosis not present

## 2022-10-17 DIAGNOSIS — E785 Hyperlipidemia, unspecified: Secondary | ICD-10-CM | POA: Diagnosis not present

## 2022-10-17 DIAGNOSIS — R6889 Other general symptoms and signs: Secondary | ICD-10-CM | POA: Diagnosis not present

## 2022-10-17 DIAGNOSIS — Z79890 Hormone replacement therapy (postmenopausal): Secondary | ICD-10-CM

## 2022-10-17 DIAGNOSIS — A419 Sepsis, unspecified organism: Secondary | ICD-10-CM | POA: Diagnosis not present

## 2022-10-17 DIAGNOSIS — Z743 Need for continuous supervision: Secondary | ICD-10-CM | POA: Diagnosis not present

## 2022-10-17 DIAGNOSIS — E669 Obesity, unspecified: Secondary | ICD-10-CM | POA: Diagnosis present

## 2022-10-17 DIAGNOSIS — Z8616 Personal history of COVID-19: Secondary | ICD-10-CM | POA: Diagnosis not present

## 2022-10-17 DIAGNOSIS — R600 Localized edema: Secondary | ICD-10-CM | POA: Diagnosis not present

## 2022-10-17 DIAGNOSIS — L03116 Cellulitis of left lower limb: Principal | ICD-10-CM | POA: Diagnosis present

## 2022-10-17 DIAGNOSIS — M7989 Other specified soft tissue disorders: Secondary | ICD-10-CM | POA: Diagnosis not present

## 2022-10-17 DIAGNOSIS — I739 Peripheral vascular disease, unspecified: Secondary | ICD-10-CM | POA: Diagnosis present

## 2022-10-17 DIAGNOSIS — N170 Acute kidney failure with tubular necrosis: Secondary | ICD-10-CM

## 2022-10-17 DIAGNOSIS — Z6835 Body mass index (BMI) 35.0-35.9, adult: Secondary | ICD-10-CM | POA: Diagnosis not present

## 2022-10-17 DIAGNOSIS — Z87891 Personal history of nicotine dependence: Secondary | ICD-10-CM | POA: Diagnosis not present

## 2022-10-17 DIAGNOSIS — L89312 Pressure ulcer of right buttock, stage 2: Secondary | ICD-10-CM | POA: Diagnosis present

## 2022-10-17 DIAGNOSIS — L8990 Pressure ulcer of unspecified site, unspecified stage: Secondary | ICD-10-CM | POA: Insufficient documentation

## 2022-10-17 DIAGNOSIS — R609 Edema, unspecified: Secondary | ICD-10-CM | POA: Diagnosis not present

## 2022-10-17 DIAGNOSIS — M19072 Primary osteoarthritis, left ankle and foot: Secondary | ICD-10-CM | POA: Diagnosis not present

## 2022-10-17 LAB — BASIC METABOLIC PANEL
Anion gap: 12 (ref 5–15)
BUN: 32 mg/dL — ABNORMAL HIGH (ref 8–23)
CO2: 20 mmol/L — ABNORMAL LOW (ref 22–32)
Calcium: 7.4 mg/dL — ABNORMAL LOW (ref 8.9–10.3)
Chloride: 101 mmol/L (ref 98–111)
Creatinine, Ser: 2.45 mg/dL — ABNORMAL HIGH (ref 0.61–1.24)
GFR, Estimated: 25 mL/min — ABNORMAL LOW (ref >60)
Glucose, Bld: 131 mg/dL — ABNORMAL HIGH (ref 70–99)
Potassium: 4.1 mmol/L (ref 3.5–5.1)
Sodium: 133 mmol/L — ABNORMAL LOW (ref 135–145)

## 2022-10-17 LAB — CBC
HCT: 32.5 % — ABNORMAL LOW (ref 39.0–52.0)
Hemoglobin: 10.6 g/dL — ABNORMAL LOW (ref 13.0–17.0)
MCH: 28.8 pg (ref 26.0–34.0)
MCHC: 32.6 g/dL (ref 30.0–36.0)
MCV: 88.3 fL (ref 80.0–100.0)
Platelets: 425 10*3/uL — ABNORMAL HIGH (ref 150–400)
RBC: 3.68 MIL/uL — ABNORMAL LOW (ref 4.22–5.81)
RDW: 14.3 % (ref 11.5–15.5)
WBC: 14 10*3/uL — ABNORMAL HIGH (ref 4.0–10.5)
nRBC: 0 % (ref 0.0–0.2)

## 2022-10-17 LAB — LACTIC ACID, PLASMA
Lactic Acid, Venous: 1.6 mmol/L (ref 0.5–1.9)
Lactic Acid, Venous: 1.4 mmol/L (ref 0.5–1.9)

## 2022-10-17 MED ORDER — HYDROXYZINE HCL 25 MG PO TABS: 25.0000 mg | ORAL_TABLET | Freq: Every evening | ORAL | Status: DC | PRN

## 2022-10-17 MED ORDER — ONDANSETRON HCL 4 MG/2ML IJ SOLN: 4.0000 mg | Freq: Four times a day (QID) | INTRAMUSCULAR | Status: DC | PRN

## 2022-10-17 MED ORDER — LEVOTHYROXINE SODIUM 25 MCG PO TABS
25.0000 ug | ORAL_TABLET | Freq: Every day | ORAL | Status: DC
  Administered 2022-10-18 – 2022-10-20 (×3): 25 ug via ORAL
  Filled 2022-10-17 (×3): qty 1

## 2022-10-17 MED ORDER — HYDROCODONE-ACETAMINOPHEN 10-325 MG PO TABS
0.5000 | ORAL_TABLET | Freq: Four times a day (QID) | ORAL | Status: DC | PRN
  Administered 2022-10-17: 0.5 via ORAL
  Administered 2022-10-18 – 2022-10-20 (×5): 1 via ORAL
  Filled 2022-10-17 (×6): qty 1

## 2022-10-17 MED ORDER — ONDANSETRON HCL 4 MG PO TABS: 4.0000 mg | ORAL_TABLET | Freq: Four times a day (QID) | ORAL | Status: DC | PRN

## 2022-10-17 MED ORDER — ROSUVASTATIN CALCIUM 20 MG PO TABS
20.0000 mg | ORAL_TABLET | Freq: Every evening | ORAL | Status: DC
  Administered 2022-10-17 – 2022-10-20 (×4): 20 mg via ORAL
  Filled 2022-10-17 (×4): qty 1

## 2022-10-17 MED ORDER — FENTANYL CITRATE PF 50 MCG/ML IJ SOSY
50.0000 ug | PREFILLED_SYRINGE | Freq: Once | INTRAMUSCULAR | Status: AC
  Administered 2022-10-17: 50 ug via INTRAVENOUS
  Filled 2022-10-17: qty 1

## 2022-10-17 MED ORDER — SODIUM CHLORIDE 0.9 % IV BOLUS
1000.0000 mL | Freq: Once | INTRAVENOUS | Status: AC
Start: 2022-10-17 — End: 2022-10-17
  Administered 2022-10-17: 1000 mL via INTRAVENOUS

## 2022-10-17 MED ORDER — QUETIAPINE FUMARATE 25 MG PO TABS
50.0000 mg | ORAL_TABLET | Freq: Every day | ORAL | Status: DC
  Administered 2022-10-17 – 2022-10-19 (×3): 50 mg via ORAL
  Filled 2022-10-17 (×3): qty 2

## 2022-10-17 MED ORDER — OXYCODONE HCL 5 MG PO TABS: 5.0000 mg | ORAL_TABLET | ORAL | Status: DC | PRN

## 2022-10-17 MED ORDER — VANCOMYCIN HCL IN DEXTROSE 1-5 GM/200ML-% IV SOLN: 1000.0000 mg | Freq: Once | INTRAVENOUS | Status: DC

## 2022-10-17 MED ORDER — VANCOMYCIN HCL 2000 MG/400ML IV SOLN
2000.0000 mg | Freq: Once | INTRAVENOUS | Status: AC
  Administered 2022-10-17: 2000 mg via INTRAVENOUS
  Filled 2022-10-17: qty 400

## 2022-10-17 MED ORDER — POLYETHYLENE GLYCOL 3350 17 G PO PACK: 17.0000 g | PACK | Freq: Every day | ORAL | Status: DC | PRN

## 2022-10-17 MED ORDER — SODIUM CHLORIDE 0.9 % IV SOLN: 2.0000 g | INTRAVENOUS | Status: DC

## 2022-10-17 MED ORDER — SODIUM CHLORIDE 0.9 % IV SOLN
2.0000 g | Freq: Once | INTRAVENOUS | Status: AC
  Administered 2022-10-17: 2 g via INTRAVENOUS
  Filled 2022-10-17: qty 12.5

## 2022-10-17 MED ORDER — APIXABAN 5 MG PO TABS: 5.0000 mg | ORAL_TABLET | Freq: Two times a day (BID) | ORAL | Status: DC

## 2022-10-17 MED ORDER — VANCOMYCIN VARIABLE DOSE PER UNSTABLE RENAL FUNCTION (PHARMACIST DOSING): Status: DC

## 2022-10-17 MED ORDER — SODIUM CHLORIDE 0.9 % IV SOLN: INTRAVENOUS | Status: DC

## 2022-10-17 MED ORDER — APIXABAN 2.5 MG PO TABS
2.5000 mg | ORAL_TABLET | Freq: Two times a day (BID) | ORAL | Status: DC
  Administered 2022-10-17 – 2022-10-20 (×6): 2.5 mg via ORAL
  Filled 2022-10-17 (×6): qty 1

## 2022-10-17 NOTE — Sepsis Progress Note (Signed)
eLink is following this Code Sepsis. °

## 2022-10-17 NOTE — Progress Notes (Signed)
Male wick place on patient by nurse tech B and E. MD on call notified. Plan of care ongoing.

## 2022-10-17 NOTE — ED Provider Notes (Signed)
[place] EMERGENCY DEPARTMENT AT [facility] Provider Note   CSN: [id number] Arrival date & time: 10/17/22  1342     History  Chief Complaint  Patient presents with   Leg Swelling    Curtis Jenkins is a 87 y.o. male past medical history significant for hypertension, hyperlipidemia, previous stroke, speech disturbance secondary to stroke, dysphagia secondary to stroke, A-fib, CKD who presents with concern for worsening left lower extremity infection/swelling.  Patient reports cellulitis for around 1 week.  Has been taking 1 kind of an antibiotic but he is not sure what antibiotic he is taking.  Reports no improvement, if anything has been having worsening of the left lower extremity.  Decreased ability to ambulate.  He denies any other symptoms at this time.  Per EMS was borderline febrile, 99.9 temperature, and hypotensive blood pressures x 2, mild improvement after 500 cc bolus.  HPI     Home Medications Prior to Admission medications   Medication Sig Start Date End Date Taking? Authorizing Provider  amLODipine (NORVASC) 5 MG tablet Take 5 mg by mouth daily. 01/11/19   [provider]  apixaban (ELIQUIS) 5 MG TABS tablet Take 1 tablet (5 mg total) by mouth 2 (two) times daily. 02/01/20   Marinus Maw, MD  Cholecalciferol (VITAMIN D3) 125 MCG (5000 UT) CAPS Take 5,000 Units by mouth daily.    [provider]  ciprofloxacin (CIPRO) 500 MG tablet Take 500 mg by mouth 2 (two) times daily. 08/02/21   [provider]  clotrimazole-betamethasone (LOTRISONE) cream Apply 1 application topically 2 (two) times daily as needed (irritation/rash.).  04/29/19   [provider]  hydrOXYzine (ATARAX/VISTARIL) 25 MG tablet Take 25 mg by mouth every 6 (six) hours as needed for itching. 08/07/20   [provider]  Magnesium 200 MG TABS Take 200 mg by mouth at bedtime.    [provider]  QUEtiapine (SEROQUEL) 25 MG tablet Take 25 mg by  mouth at bedtime. 07/31/21   [provider]  rosuvastatin (CRESTOR) 20 MG tablet Take 20 mg by mouth every evening.  05/04/19   [provider]  traMADol (ULTRAM) 50 MG tablet Take 1 tablet (50 mg total) by mouth 3 (three) times daily as needed (pain.). 09/10/20   McKenzie, Mardene Celeste, MD  zolpidem (AMBIEN) 10 MG tablet Take 10 mg by mouth at bedtime. Patient not taking: Reported on 08/07/2021 05/02/19   [provider]      Allergies    Patient has no known allergies.    Review of Systems   Review of Systems  All other systems reviewed and are negative.   Physical Exam Updated Vital Signs BP (!) 101/56   Pulse 81   Temp 98.4 F (36.9 C)   Resp 16   Wt 108 kg   SpO2 95%   BMI 32.29 kg/m  Physical Exam Vitals and nursing note reviewed.  Constitutional:      General: He is not in acute distress.    Appearance: Normal appearance.  HENT:     Head: Normocephalic and atraumatic.  Eyes:     General:        Right eye: No discharge.        Left eye: No discharge.  Cardiovascular:     Rate and Rhythm: Normal rate and regular rhythm.     Comments: 1-2+ dp/pt pulses in affected LLE Pulmonary:     Effort: Pulmonary effort is normal. No respiratory distress.  Breath sounds: Normal breath sounds.  Abdominal:     General: Bowel sounds are normal.     Palpations: Abdomen is soft.  Musculoskeletal:        General: No deformity.     Comments: Extremely swollen, erythematous, fluctuant skin surrounding the left ankle and dorsum of left foot.  There are several areas of necrotic tissue with blackened skin, some skin breakdown and bloody drainage.  There are other areas with vesicles arising from the skin.  Edema is pitting in nature.  Skin:    General: Skin is warm and dry.     Capillary Refill: Capillary refill takes less than 2 seconds.  Neurological:     Mental Status: He is alert and oriented to person, place, and time.  Psychiatric:        Mood and  Affect: Mood normal.        Behavior: Behavior normal.     ED Results / Procedures / Treatments   Labs (all labs ordered are listed, but only abnormal results are displayed) Labs Reviewed  CBC - Abnormal; Notable for the following components:      Result Value   WBC 14.0 (*)    RBC 3.68 (*)    Hemoglobin 10.6 (*)    HCT 32.5 (*)    Platelets 425 (*)    All other components within normal limits  BASIC METABOLIC PANEL - Abnormal; Notable for the following components:   Sodium 133 (*)    CO2 20 (*)    Glucose, Bld 131 (*)    BUN 32 (*)    Creatinine, Ser 2.45 (*)    Calcium 7.4 (*)    GFR, Estimated 25 (*)    All other components within normal limits  CULTURE, BLOOD (ROUTINE X 2)  CULTURE, BLOOD (ROUTINE X 2)  LACTIC ACID, PLASMA  LACTIC ACID, PLASMA    EKG EKG Interpretation Date/Time:  Friday October 17 2022 14:03:03 EDT Ventricular Rate:  81 PR Interval:  50 QRS Duration:  126 QT Interval:  424 QTC Calculation: 493 R Axis:   -26  Text Interpretation: Sinus rhythm Atrial premature complexes Short PR interval Left bundle branch block No significant change since prior 1/22 Confirmed by Meridee Score [phone]) on 10/17/2022 3:04:18 PM  Radiology DG Foot Complete Left  Result Date: 10/17/2022 CLINICAL DATA:  cellulitis. Pain/redness and swelling of distal lower leg into the ankle and foot. EXAM: LEFT FOOT - COMPLETE 3+ VIEW; LEFT ANKLE COMPLETE - 3+ VIEW COMPARISON:  None Available. FINDINGS: No acute fracture or dislocation. No aggressive osseous lesion. Note is made of hammertoe deformity of toes. Ankle mortise appears intact. Mild diffuse degenerative changes of imaged joints. Calcaneal spur noted along the Plantar aponeurosis attachment site. Mild diffuse swelling around ankle joint. No evidence of air within the soft tissue. No radiopaque foreign bodies. IMPRESSION: 1. Mild diffuse swelling around the ankle joint. No radiographic evidence of osteomyelitis. 2. Mild  diffuse degenerative changes. Calcaneal spur. Electronically Signed   By: Jules Schick M.D.   On: 10/17/2022 15:46   DG Ankle Complete Left  Result Date: 10/17/2022 CLINICAL DATA:  cellulitis. Pain/redness and swelling of distal lower leg into the ankle and foot. EXAM: LEFT FOOT - COMPLETE 3+ VIEW; LEFT ANKLE COMPLETE - 3+ VIEW COMPARISON:  None Available. FINDINGS: No acute fracture or dislocation. No aggressive osseous lesion. Note is made of hammertoe deformity of toes. Ankle mortise appears intact. Mild diffuse degenerative changes of imaged joints. Calcaneal spur noted along the  Plantar aponeurosis attachment site. Mild diffuse swelling around ankle joint. No evidence of air within the soft tissue. No radiopaque foreign bodies. IMPRESSION: 1. Mild diffuse swelling around the ankle joint. No radiographic evidence of osteomyelitis. 2. Mild diffuse degenerative changes. Calcaneal spur. Electronically Signed   By: Jules Schick M.D.   On: 10/17/2022 15:46    Procedures Procedures    Medications Ordered in ED Medications  vancomycin (VANCOREADY) IVPB 2000 mg/400 mL (2,000 mg Intravenous New Bag/Given 10/17/22 1508)  sodium chloride 0.9 % bolus 1,000 mL (0 mLs Intravenous Stopped 10/17/22 1459)  ceFEPIme (MAXIPIME) 2 g in sodium chloride 0.9 % 100 mL IVPB (0 g Intravenous Stopped 10/17/22 1533)  fentaNYL (SUBLIMAZE) injection 50 mcg (50 mcg Intravenous Given 10/17/22 1459)    ED Course/ Medical Decision Making/ A&P                                 Medical Decision Making Amount and/or Complexity of Data Reviewed Labs: ordered. Radiology: ordered.  Risk Prescription drug management.   This patient is a 87 y.o. male who presents to the ED for concern of left lower extremity swelling, redness, heat, drainage, failure of outpatient antibiotics, this involves an extensive number of treatment options, and is a complaint that carries with it a high risk of complications and morbidity. The  emergent differential diagnosis prior to evaluation includes, but is not limited to, cellulitis, developing sepsis, bacteremia, osteomyelitis, versus other. This is not an exhaustive differential.   Past Medical History / Co-morbidities / Social History: Hypertension, hyperlipidemia, previous stroke, paroxysmal A-fib currently anticoagulated on Eliquis, CKD  Additional history: Chart reviewed. Pertinent results include: Reviewed patient's outpatient cardiology visits, no recent emergency department visits to review within the last year  Physical Exam: Physical exam performed. The pertinent findings include: Extremely swollen, erythematous, fluctuant skin surrounding the left ankle and dorsum of left foot.  There are several areas of necrotic tissue with blackened skin, some skin breakdown and bloody drainage.  There are other areas with vesicles arising from the skin.  Edema is pitting in nature.   1-2+ dp/pt pulses in affected LLE  Afebrile in the emergency department, however EMS reported 99.9 temperature in the field, as well as a few episodes of hypotension, diastolic in the 30s, systolic around 90-100.  Lab Tests: I ordered, and personally interpreted labs.  The pertinent results include: CBC with leukocytosis, blood cells 14, mildly elevated platelets as well could suggest some degree of acute phase reactant.  He is anemic with hemoglobin 10.6, mildly decreased from baseline.  No reports of any dark, melanotic stool.  Likely anemia of chronic disease.  No microcytic quality.  BMP notable for mild hyponatremia, sodium 133, he has an AKI with BUN 32, creatinine 2.45 from baseline around 1.8   Imaging Studies: I ordered imaging studies including plain film radiograph of the left foot, left ankle. I independently visualized and interpreted imaging which showed soft tissue swelling without overt osteromyelitis. I agree with the radiologist interpretation.   Cardiac Monitoring:  The patient was  maintained on a cardiac monitor.  My attending physician Dr. Rhunette Croft viewed and interpreted the cardiac monitored which showed an underlying rhythm of: Normal sinus rhythm, left bundle branch block. I agree with this interpretation.   Medications: I ordered medication including fluid bolus, fentanyl for pain, developing sepsis, cefepime, vancomycin for lower extremity cellulitis in context of developing sepsis  Consultations Obtained: I  requested consultation with the hospitalist, spoke with Dr. Adrian Blackwater,  and discussed lab and imaging findings as well as pertinent plan - they recommend: Admission for lower extremity cellulitis, developing sepsis, and failure of outpatient antibiotics   Disposition: After consideration of the diagnostic results and the patients response to treatment, I feel that patient would benefit from admission as discussed above.   I discussed this case with my attending physician Dr. Rhunette Croft who cosigned this note including patient's presenting symptoms, physical exam, and planned diagnostics and interventions. Attending physician stated agreement with plan or made changes to plan which were implemented.    Final Clinical Impression(s) / ED Diagnoses Final diagnoses:  Cellulitis of left lower extremity  AKI (acute kidney injury) [facility])    Rx / DC Orders ED Discharge Orders     None         Olene Floss, PA-C 10/17/22 1643    Derwood Kaplan, MD 10/19/22 [phone]

## 2022-10-17 NOTE — Progress Notes (Signed)
Pharmacy Antibiotic Note  Curtis Jenkins is a 87 y.o. male admitted on 10/17/2022 with cellulitis.  Pharmacy has been consulted for Vancomycin and Cefepime dosing.  -Hx Afib, CKD3, stroke,dysphagia, HTN, HLD -Per H&P:  fevers last week, he was started on Paxlovid to cover COVID, then was given doxycycline, then clindamycin for cellulitis and not improving  Plan: -Patient received Vancomycin 2000 mg IV x 1 as Loading dose on 10/4 @ 1508 Scr 2.45  Crcl 26.7 ml/min Will assess renal fxn in am for further vancomycin dosing   -Cefepime 2 gm IV x 1 given Will continue with Cefepime 2 gm IV q24h   for Crcl 26.7 ml/min  Continue to assess renal fxn and cultures, etc   Height: 5\' 9"  (175.3 cm) Weight: 108 kg (238 lb 1.6 oz) IBW/kg (Calculated) : 70.7  Temp (24hrs), Avg:98.5 F (36.9 C), Min:98 F (36.7 C), Max:99.1 F (37.3 C)  Recent Labs  Lab 10/17/22 1418 10/17/22 1623  WBC 14.0*  --   CREATININE 2.45*  --   LATICACIDVEN 1.6 1.4    Estimated Creatinine Clearance: 26.7 mL/min (A) (by C-G formula based on SCr of 2.45 mg/dL (H)).    No Known Allergies  Antimicrobials this admission: Cefepime 10/4 >>   vancomycin 10/4 >>    Dose adjustments this admission:    Microbiology results: 10/4 BCx: pending   UCx:      Sputum:      MRSA PCR:    Thank you for allowing pharmacy to be a part of this patient's care.  Bari Mantis PharmD Clinical Pharmacist 10/17/2022

## 2022-10-17 NOTE — ED Triage Notes (Signed)
BIBA c/o left lower extremity swelling with redness, heat, and drainage.  Pedal pulse + Pt reports being on abx x1 week for cellulitis w/o relief.  Ems reports hypotension 95/32 with 1L bolus admin

## 2022-10-17 NOTE — H&P (Signed)
History and Physical    Patient: Curtis Jenkins:[id number] DOB: 07-Jun-1935 DOA: 10/17/2022 DOS: the patient was seen and examined on 10/17/2022 PCP: Elfredia Nevins, MD  Patient coming from: Home  Chief Complaint:  Chief Complaint  Patient presents with   Leg Swelling   HPI: Curtis Jenkins is a 87 y.o. male with medical history significant of hypertension, stage III chronic kidney disease, atrial fibrillation on anticoagulation, hyperlipidemia, history of stroke with residual left-sided weakness.  Patient presents with fever, swelling of his left leg with erythema and pain.  With him starting fevers last week, he was started on Paxlovid to cover COVID, then was given doxycycline, then clindamycin for cellulitis.  As his symptoms were not improving, his daughter called EMS who brought him here for evaluation.  When EMS arrived, he was hypotensive.  They gave him some IV fluids with some improvement of his hypotension.  He does not have diabetes.  Review of Systems: As mentioned in the history of present illness. All other systems reviewed and are negative. Past Medical History:  Diagnosis Date   Arthritis    Atrial fibrillation (HCC) 03/2019   Chronic kidney disease    Dysrhythmia    Hyperlipidemia    Hypertension    Sleep apnea    Stroke (HCC) 03/2019   left sided weakness   Past Surgical History:  Procedure Laterality Date   BACK SURGERY     CATARACT EXTRACTION W/PHACO Left 07/08/2021   Procedure: CATARACT EXTRACTION PHACO AND INTRAOCULAR LENS PLACEMENT (IOC);  Surgeon: Fabio Pierce, MD;  Location: [facility];  Service: Ophthalmology;  Laterality: Left;  CDE: 16.23   CYSTOSCOPY N/A 09/10/2020   Procedure: CYSTOSCOPY;  Surgeon: Malen Gauze, MD;  Location: [facility];  Service: Urology;  Laterality: N/A;   CYSTOSCOPY W/ RETROGRADES Bilateral 08/11/2019   Procedure: CYSTOSCOPY WITH RETROGRADE PYELOGRAM;  Surgeon: Malen Gauze, MD;  Location: [facility];  Service: Urology;   Laterality: Bilateral;   left knee surgery     LOOP RECORDER INSERTION N/A 04/12/2019   Procedure: LOOP RECORDER INSERTION;  Surgeon: Marinus Maw, MD;  Location: [facility];  Service: Cardiovascular;  Laterality: N/A;   TRANSURETHRAL RESECTION OF BLADDER TUMOR Bilateral 08/11/2019   Procedure: TRANSURETHRAL RESECTION OF BLADDER TUMOR (TURBT);  Surgeon: Malen Gauze, MD;  Location: [facility];  Service: Urology;  Laterality: Bilateral;   TRANSURETHRAL RESECTION OF BLADDER TUMOR N/A 09/10/2020   Procedure: TRANSURETHRAL RESECTION OF BLADDER TUMOR (TURBT);  Surgeon: Malen Gauze, MD;  Location: [facility];  Service: Urology;  Laterality: N/A;   Social History:  reports that he has never smoked. He has quit using smokeless tobacco.  His smokeless tobacco use included chew. He reports that he does not currently use alcohol. He reports that he does not use drugs.  No Known Allergies  History reviewed. No pertinent family history.  Prior to Admission medications   Medication Sig Start Date End Date Taking? Authorizing Provider  amLODipine (NORVASC) 5 MG tablet Take 5 mg by mouth daily. 01/11/19  Yes [provider]  apixaban (ELIQUIS) 5 MG TABS tablet Take 1 tablet (5 mg total) by mouth 2 (two) times daily. 02/01/20  Yes Marinus Maw, MD  cetirizine (ZYRTEC) 10 MG tablet Take 10 mg by mouth daily.   Yes [provider]  Cholecalciferol (VITAMIN D3) 125 MCG (5000 UT) CAPS Take 5,000 Units by mouth daily.   Yes [provider]  clindamycin (CLEOCIN) 300 MG capsule Take  300 mg by mouth 3 (three) times daily. 10/14/22  Yes [provider]  clotrimazole-betamethasone (LOTRISONE) cream Apply 1 application topically 2 (two) times daily as needed (irritation/rash.).  04/29/19  Yes [provider]  HYDROcodone-acetaminophen (NORCO) 10-325 MG tablet Take 0.5-1 tablets by mouth 4 (four) times daily as needed for moderate pain.   Yes [provider]  hydrOXYzine (ATARAX/VISTARIL) 25 MG tablet Take 25 mg by mouth at bedtime. 08/07/20  Yes [provider]  levothyroxine (SYNTHROID) 25 MCG tablet Take 25 mcg by mouth daily. 10/15/22  Yes [provider]  QUEtiapine (SEROQUEL) 50 MG tablet Take 50 mg by mouth at bedtime. 09/26/22  Yes [provider]  rosuvastatin (CRESTOR) 20 MG tablet Take 20 mg by mouth every evening.  05/04/19  Yes [provider]  traMADol (ULTRAM) 50 MG tablet Take 1 tablet (50 mg total) by mouth 3 (three) times daily as needed (pain.). Patient taking differently: Take 50 mg by mouth 2 (two) times daily. 09/10/20  Yes McKenzie, Mardene Celeste, MD  doxycycline (VIBRAMYCIN) 100 MG capsule Take 100 mg by mouth 2 (two) times daily. Patient not taking: Reported on 10/17/2022 10/09/22   [provider]  PAXLOVID, 150/100, 10 x 150 MG & 10 x 100MG  TBPK Take 2 tablets by mouth 2 (two) times daily. Patient not taking: Reported on 10/17/2022 10/09/22   [provider]    Physical Exam: Vitals:   10/17/22 1645 10/17/22 1715 10/17/22 1718 10/17/22 1730  BP: (!) 107/53 (!) 106/50 (!) 107/51 94/60  Pulse: 86 79 80 78  Resp: 19 18 17 19   Temp: 98 F (36.7 C)     SpO2: 95% 95% 98% 96%  Weight:       General: Elderly male. Awake and alert and oriented x3. No acute cardiopulmonary distress.  HEENT: Normocephalic atraumatic.  Right and left ears normal in appearance.  Pupils equal, round, reactive to light. Extraocular muscles are intact. Sclerae anicteric and noninjected.  Moist mucosal membranes. No mucosal lesions.  Neck: Neck supple without lymphadenopathy. No carotid bruits. No masses palpated.  Cardiovascular: Regular rate with normal S1-S2 sounds. No murmurs, rubs, gallops auscultated. No JVD.  Respiratory: Good respiratory effort with no wheezes, rales, rhonchi. Lungs clear to auscultation bilaterally.  No accessory muscle use. Abdomen: Soft, nontender, nondistended.  Active bowel sounds. No masses or hepatosplenomegaly  Skin: There is significant erythema and swelling of his left lower extremity from the midfoot to his mid tibia.  There is an area on the dorsal aspect of his foot that has blackened skin with some bleeding from that area.  Dry, warm to touch. 2+ dorsalis pedis and radial pulses. Musculoskeletal: No calf or leg pain. All major joints not erythematous nontender.  No upper or lower joint deformation.  Good ROM.  No contractures  Psychiatric: Intact judgment and insight. Pleasant and cooperative. Neurologic: No focal neurological deficits. Strength is 5/5 and symmetric in upper and lower extremities.  Cranial nerves II through XII are grossly intact.  Data Reviewed: Results for orders placed or performed during the hospital encounter of 10/17/22 (from the past 24 hour(s))  Lactic acid, plasma     Status: None   Collection Time: 10/17/22  2:18 PM  Result Value Ref Range   Lactic Acid, Venous 1.6 0.5 - 1.9 mmol/L  Blood culture (routine x 2)     Status: None (Preliminary result)   Collection Time: 10/17/22  2:18 PM   Specimen: BLOOD  Result Value Ref Range  Specimen Description BLOOD RIGHT ANTECUBITAL    Special Requests      BOTTLES DRAWN AEROBIC AND ANAEROBIC Blood Culture results may not be optimal due to an excessive volume of blood received in culture bottles Performed at [facility], [address], [place], [place] [address]    Culture PENDING    Report Status PENDING   Blood culture (routine x 2)     Status: None (Preliminary result)   Collection Time: 10/17/22  2:18 PM   Specimen: BLOOD  Result Value Ref Range   Specimen Description BLOOD LEFT ANTECUBITAL    Special Requests      BOTTLES DRAWN AEROBIC AND ANAEROBIC Blood Culture results may not be optimal due to an excessive volume of blood received in culture bottles Performed at [facility], [address] [place], [place] [address]    Culture PENDING    Report Status  PENDING   CBC     Status: Abnormal   Collection Time: 10/17/22  2:18 PM  Result Value Ref Range   WBC 14.0 (H) 4.0 - 10.5 K/uL   RBC 3.68 (L) 4.22 - 5.81 MIL/uL   Hemoglobin 10.6 (L) 13.0 - 17.0 g/dL   HCT 63.0 (L) 16.0 - 10.9 %   MCV 88.3 80.0 - 100.0 fL   MCH 28.8 26.0 - 34.0 pg   MCHC 32.6 30.0 - 36.0 g/dL   RDW 32.3 55.7 - 32.2 %   Platelets 425 (H) 150 - 400 K/uL   nRBC 0.0 0.0 - 0.2 %  Basic metabolic panel     Status: Abnormal   Collection Time: 10/17/22  2:18 PM  Result Value Ref Range   Sodium 133 (L) 135 - 145 mmol/L   Potassium 4.1 3.5 - 5.1 mmol/L   Chloride 101 98 - 111 mmol/L   CO2 20 (L) 22 - 32 mmol/L   Glucose, Bld 131 (H) 70 - 99 mg/dL   BUN 32 (H) 8 - 23 mg/dL   Creatinine, Ser 0.25 (H) 0.61 - 1.24 mg/dL   Calcium 7.4 (L) 8.9 - 10.3 mg/dL   GFR, Estimated 25 (L) >60 mL/min   Anion gap 12 5 - 15  Lactic acid, plasma     Status: None   Collection Time: 10/17/22  4:23 PM  Result Value Ref Range   Lactic Acid, Venous 1.4 0.5 - 1.9 mmol/L    DG Foot Complete Left  Result Date: 10/17/2022 CLINICAL DATA:  cellulitis. Pain/redness and swelling of distal lower leg into the ankle and foot. EXAM: LEFT FOOT - COMPLETE 3+ VIEW; LEFT ANKLE COMPLETE - 3+ VIEW COMPARISON:  None Available. FINDINGS: No acute fracture or dislocation. No aggressive osseous lesion. Note is made of hammertoe deformity of toes. Ankle mortise appears intact. Mild diffuse degenerative changes of imaged joints. Calcaneal spur noted along the Plantar aponeurosis attachment site. Mild diffuse swelling around ankle joint. No evidence of air within the soft tissue. No radiopaque foreign bodies. IMPRESSION: 1. Mild diffuse swelling around the ankle joint. No radiographic evidence of osteomyelitis. 2. Mild diffuse degenerative changes. Calcaneal spur. Electronically Signed   By: Jules Schick M.D.   On: 10/17/2022 15:46   DG Ankle Complete Left  Result Date: 10/17/2022 CLINICAL DATA:  cellulitis.  Pain/redness and swelling of distal lower leg into the ankle and foot. EXAM: LEFT FOOT - COMPLETE 3+ VIEW; LEFT ANKLE COMPLETE - 3+ VIEW COMPARISON:  None Available. FINDINGS: No acute fracture or dislocation. No aggressive osseous lesion. Note is made of hammertoe  deformity of toes. Ankle mortise appears intact. Mild diffuse degenerative changes of imaged joints. Calcaneal spur noted along the Plantar aponeurosis attachment site. Mild diffuse swelling around ankle joint. No evidence of air within the soft tissue. No radiopaque foreign bodies. IMPRESSION: 1. Mild diffuse swelling around the ankle joint. No radiographic evidence of osteomyelitis. 2. Mild diffuse degenerative changes. Calcaneal spur. Electronically Signed   By: Jules Schick M.D.   On: 10/17/2022 15:46     Assessment and Plan: No notes have been filed under this hospital service. Service: Hospitalist  Principal Problem:   Sepsis (HCC) Active Problems:   HTN (hypertension)   PAF (paroxysmal atrial fibrillation) (HCC)   Pressure injury of skin   Acute-on-chronic kidney injury (HCC)  Sepsis Continue IV fluids, Blood cultures obtained Cellulitis At this point, the patient has failed outpatient treatment being on 2 different antibiotics without improvement. Continue vancomycin and cefepime. MRSA swab to rule out MRSA.  If negative, can discontinue vancomycin Repeat CBC in the morning Will watch for improvement PAF On anticoagulation Pressure ulcer of skin Wound care Acute kidney injury on chronic kidney disease Gentle IV fluids with creatinine in the morning   Advance Care Planning:   Code Status: Limited: Do not attempt resuscitation (DNR) -DNR-LIMITED -Do Not Intubate/DNI confirmed by patient  Consults: None  Family Communication: Daughter at bedside who provides history  Severity of Illness: The appropriate patient status for this patient is INPATIENT. Inpatient status is judged to be reasonable and necessary in  order to provide the required intensity of service to ensure the patient's safety. The patient's presenting symptoms, physical exam findings, and initial radiographic and laboratory data in the context of their chronic comorbidities is felt to place them at high risk for further clinical deterioration. Furthermore, it is not anticipated that the patient will be medically stable for discharge from the hospital within 2 midnights of admission.   * I certify that at the point of admission it is my clinical judgment that the patient will require inpatient hospital care spanning beyond 2 midnights from the point of admission due to high intensity of service, high risk for further deterioration and high frequency of surveillance required.*  Author: Levie Heritage, DO 10/17/2022 6:56 PM  For on call review [email or web address].

## 2022-10-18 ENCOUNTER — Inpatient Hospital Stay (HOSPITAL_COMMUNITY): Payer: Medicare Other

## 2022-10-18 DIAGNOSIS — A419 Sepsis, unspecified organism: Secondary | ICD-10-CM

## 2022-10-18 DIAGNOSIS — R6521 Severe sepsis with septic shock: Secondary | ICD-10-CM | POA: Diagnosis not present

## 2022-10-18 LAB — CBC
HCT: 28.8 % — ABNORMAL LOW (ref 39.0–52.0)
Hemoglobin: 9.4 g/dL — ABNORMAL LOW (ref 13.0–17.0)
MCH: 29 pg (ref 26.0–34.0)
MCHC: 32.6 g/dL (ref 30.0–36.0)
MCV: 88.9 fL (ref 80.0–100.0)
Platelets: 292 10*3/uL (ref 150–400)
RBC: 3.24 MIL/uL — ABNORMAL LOW (ref 4.22–5.81)
RDW: 14.3 % (ref 11.5–15.5)
WBC: 11 10*3/uL — ABNORMAL HIGH (ref 4.0–10.5)
nRBC: 0 % (ref 0.0–0.2)

## 2022-10-18 LAB — BASIC METABOLIC PANEL
Anion gap: 8 (ref 5–15)
BUN: 26 mg/dL — ABNORMAL HIGH (ref 8–23)
CO2: 18 mmol/L — ABNORMAL LOW (ref 22–32)
Calcium: 6.9 mg/dL — ABNORMAL LOW (ref 8.9–10.3)
Chloride: 108 mmol/L (ref 98–111)
Creatinine, Ser: 2.08 mg/dL — ABNORMAL HIGH (ref 0.61–1.24)
GFR, Estimated: 31 mL/min — ABNORMAL LOW (ref >60)
Glucose, Bld: 90 mg/dL (ref 70–99)
Potassium: 4.2 mmol/L (ref 3.5–5.1)
Sodium: 134 mmol/L — ABNORMAL LOW (ref 135–145)

## 2022-10-18 LAB — MRSA NEXT GEN BY PCR, NASAL: MRSA by PCR Next Gen: NOT DETECTED

## 2022-10-18 MED ORDER — SODIUM CHLORIDE 0.9 % IV SOLN
2.0000 g | Freq: Two times a day (BID) | INTRAVENOUS | Status: DC
  Administered 2022-10-18 – 2022-10-20 (×5): 2 g via INTRAVENOUS
  Filled 2022-10-18 (×5): qty 12.5

## 2022-10-18 MED ORDER — SODIUM CHLORIDE 0.9 % IV SOLN: INTRAVENOUS | Status: AC

## 2022-10-18 MED ORDER — VANCOMYCIN HCL 1500 MG/300ML IV SOLN: 1500.0000 mg | INTRAVENOUS | Status: DC

## 2022-10-18 MED ORDER — GADOBUTROL 1 MMOL/ML IV SOLN
10.0000 mL | Freq: Once | INTRAVENOUS | Status: AC | PRN
  Administered 2022-10-18: 10 mL via INTRAVENOUS

## 2022-10-18 NOTE — Progress Notes (Signed)
Patient slept most of the night during this shift. No complaints. Wound site on left foot is red and weeping. Foam to heel. No dressing on at this time. WOC nurse to consult. Prn medication given x1. Plan of care ongoing.

## 2022-10-18 NOTE — Progress Notes (Addendum)
PROGRESS NOTE    Curtis Jenkins  ONG:[id number] DOB: [birth year] DOA: 10/17/2022 PCP: Elfredia Nevins, MD   Brief Narrative:    Curtis Jenkins is a 87 y.o. male with medical history significant of hypertension, stage III chronic kidney disease, atrial fibrillation on anticoagulation, hyperlipidemia, history of stroke with residual left-sided weakness.  Patient presents with fever, swelling of his left leg with erythema and pain.  With him starting fevers last week, he was started on Paxlovid to cover COVID, then was given doxycycline, then clindamycin for cellulitis.  As his symptoms were not improving, his daughter called EMS who brought him here for evaluation.  When EMS arrived, he was hypotensive.  They gave him some IV fluids with some improvement of his hypotension.   Assessment & Plan:   Principal Problem:   Septic shock (HCC) Active Problems:   HTN (hypertension)   PAF (paroxysmal atrial fibrillation) (HCC)   Pressure injury of skin   Acute-on-chronic kidney injury (HCC)   Cellulitis  Assessment and Plan:  Sepsis Continue IV fluids, Blood cultures obtained with no growth to date Cellulitis At this point, the patient has failed outpatient treatment being on 2 different antibiotics without improvement. Continue vancomycin and cefepime. MRSA swab to rule out MRSA.  If negative, can discontinue vancomycin Repeat CBC in the morning Await MRI and ultrasound results PAF On anticoagulation Pressure ulcer of skin Wound care Acute kidney injury on chronic kidney disease Gentle IV fluids with creatinine in the morning 6. Obesity-BMI 35.16    DVT prophylaxis:apixaban Code Status: DNR Family Communication: Daughter at bedside 10/5 Disposition Plan: Continue IV antibiotics Status is: Inpatient Remains inpatient appropriate because: Need for ongoing IV medications.   Skin Assessment:  I have examined the patient's skin and I agree with the wound assessment as performed  by the wound care RN as outlined below:  Pressure Injury 10/17/22 Buttocks Stage 2 -  Partial thickness loss of dermis presenting as a shallow open injury with a red, pink wound bed without slough. (Active)  10/17/22 1831  Location: Buttocks  Location Orientation:   Staging: Stage 2 -  Partial thickness loss of dermis presenting as a shallow open injury with a red, pink wound bed without slough.  Wound Description (Comments):   Present on Admission: Yes  Dressing Type Foam - Lift dressing to assess site every shift 10/17/22 1941    Consultants:  None  Procedures:  None  Antimicrobials:  Anti-infectives (From admission, onward)    Start     Dose/Rate Route Frequency Ordered Stop   10/18/22 1500  ceFEPIme (MAXIPIME) 2 g in sodium chloride 0.9 % 100 mL IVPB        2 g 200 mL/hr over 30 Minutes Intravenous Every 24 hours 10/17/22 1956     10/17/22 1952  vancomycin variable dose per unstable renal function (pharmacist dosing)         Does not apply See admin instructions 10/17/22 1953     10/17/22 1500  vancomycin (VANCOREADY) IVPB 2000 mg/400 mL        2,000 mg 200 mL/hr over 120 Minutes Intravenous  Once 10/17/22 1445 10/17/22 1719   10/17/22 1445  vancomycin (VANCOCIN) IVPB 1000 mg/200 mL premix  Status:  Discontinued        1,000 mg 200 mL/hr over 60 Minutes Intravenous  Once 10/17/22 1443 10/17/22 1445   10/17/22 1445  ceFEPIme (MAXIPIME) 2 g in sodium chloride 0.9 % 100 mL IVPB  2 g 200 mL/hr over 30 Minutes Intravenous  Once 10/17/22 1443 10/17/22 1533      Subjective: Patient seen and evaluated today with no new acute complaints or concerns. No acute concerns or events noted overnight.  Objective: Vitals:   10/17/22 1939 10/17/22 2147 10/17/22 2352 10/18/22 0429  BP: 110/60 106/69 (!) 104/52 (!) 106/57  Pulse: 80 78 74 76  Resp:    16  Temp: 99.1 F (37.3 C) 98.3 F (36.8 C) 98.1 F (36.7 C) 98.4 F (36.9 C)  TempSrc: Oral Oral Oral   SpO2: 96% 99% 98%  100%  Weight: 108 kg     Height: 5\' 9"  (1.753 m)       Intake/Output Summary (Last 24 hours) at 10/18/2022 0703 Last data filed at 10/18/2022 0536 Gross per 24 hour  Intake 1242.74 ml  Output 600 ml  Net 642.74 ml   Filed Weights   10/17/22 1351 10/17/22 1939  Weight: 108 kg 108 kg    Examination:  General exam: Appears calm and comfortable  Respiratory system: Clear to auscultation. Respiratory effort normal. Cardiovascular system: S1 & S2 heard, RRR.  Gastrointestinal system: Abdomen is soft Central nervous system: Alert and awake Extremities:   Psychiatry: Flat affect.    Data Reviewed: I have personally reviewed following labs and imaging studies  CBC: Recent Labs  Lab 10/17/22 1418 10/18/22 0605  WBC 14.0* 11.0*  HGB 10.6* 9.4*  HCT 32.5* 28.8*  MCV 88.3 88.9  PLT 425* 292   Basic Metabolic Panel: Recent Labs  Lab 10/17/22 1418  NA 133*  K 4.1  CL 101  CO2 20*  GLUCOSE 131*  BUN 32*  CREATININE 2.45*  CALCIUM 7.4*   GFR: Estimated Creatinine Clearance: 26.7 mL/min (A) (by C-G formula based on SCr of 2.45 mg/dL (H)). Liver Function Tests: No results for input(s): "AST", "ALT", "ALKPHOS", "BILITOT", "PROT", "ALBUMIN" in the last 168 hours. No results for input(s): "LIPASE", "AMYLASE" in the last 168 hours. No results for input(s): "AMMONIA" in the last 168 hours. Coagulation Profile: No results for input(s): "INR", "PROTIME" in the last 168 hours. Cardiac Enzymes: No results for input(s): "CKTOTAL", "CKMB", "CKMBINDEX", "TROPONINI" in the last 168 hours. BNP (last 3 results) No results for input(s): "PROBNP" in the last 8760 hours. HbA1C: No results for input(s): "HGBA1C" in the last 72 hours. CBG: No results for input(s): "GLUCAP" in the last 168 hours. Lipid Profile: No results for input(s): "CHOL", "HDL", "LDLCALC", "TRIG", "CHOLHDL", "LDLDIRECT" in the last 72 hours. Thyroid Function Tests: No results for input(s): "TSH", "T4TOTAL",  "FREET4", "T3FREE", "THYROIDAB" in the last 72 hours. Anemia Panel: No results for input(s): "VITAMINB12", "FOLATE", "FERRITIN", "TIBC", "IRON", "RETICCTPCT" in the last 72 hours. Sepsis Labs: Recent Labs  Lab 10/17/22 1418 10/17/22 1623  LATICACIDVEN 1.6 1.4    Recent Results (from the past 240 hour(s))  Blood culture (routine x 2)     Status: None (Preliminary result)   Collection Time: 10/17/22  2:18 PM   Specimen: BLOOD  Result Value Ref Range Status   Specimen Description BLOOD RIGHT ANTECUBITAL  Final   Special Requests   Final    BOTTLES DRAWN AEROBIC AND ANAEROBIC Blood Culture results may not be optimal due to an excessive volume of blood received in culture bottles   Culture   Final    NO GROWTH < 24 HOURS Performed at [facility], [address], [place], [place] [address]    Report Status PENDING  Incomplete  Blood culture (  routine x 2)     Status: None (Preliminary result)   Collection Time: 10/17/22  2:18 PM   Specimen: BLOOD  Result Value Ref Range Status   Specimen Description BLOOD LEFT ANTECUBITAL  Final   Special Requests   Final    BOTTLES DRAWN AEROBIC AND ANAEROBIC Blood Culture results may not be optimal due to an excessive volume of blood received in culture bottles   Culture   Final    NO GROWTH < 24 HOURS Performed at [facility], [address], [place], [place] [address]    Report Status PENDING  Incomplete         Radiology Studies: DG Foot Complete Left  Result Date: 10/17/2022 CLINICAL DATA:  cellulitis. Pain/redness and swelling of distal lower leg into the ankle and foot. EXAM: LEFT FOOT - COMPLETE 3+ VIEW; LEFT ANKLE COMPLETE - 3+ VIEW COMPARISON:  None Available. FINDINGS: No acute fracture or dislocation. No aggressive osseous lesion. Note is made of hammertoe deformity of toes. Ankle mortise appears intact. Mild diffuse degenerative changes of imaged joints. Calcaneal spur noted along the Plantar aponeurosis attachment site. Mild  diffuse swelling around ankle joint. No evidence of air within the soft tissue. No radiopaque foreign bodies. IMPRESSION: 1. Mild diffuse swelling around the ankle joint. No radiographic evidence of osteomyelitis. 2. Mild diffuse degenerative changes. Calcaneal spur. Electronically Signed   By: Jules Schick M.D.   On: 10/17/2022 15:46   DG Ankle Complete Left  Result Date: 10/17/2022 CLINICAL DATA:  cellulitis. Pain/redness and swelling of distal lower leg into the ankle and foot. EXAM: LEFT FOOT - COMPLETE 3+ VIEW; LEFT ANKLE COMPLETE - 3+ VIEW COMPARISON:  None Available. FINDINGS: No acute fracture or dislocation. No aggressive osseous lesion. Note is made of hammertoe deformity of toes. Ankle mortise appears intact. Mild diffuse degenerative changes of imaged joints. Calcaneal spur noted along the Plantar aponeurosis attachment site. Mild diffuse swelling around ankle joint. No evidence of air within the soft tissue. No radiopaque foreign bodies. IMPRESSION: 1. Mild diffuse swelling around the ankle joint. No radiographic evidence of osteomyelitis. 2. Mild diffuse degenerative changes. Calcaneal spur. Electronically Signed   By: Jules Schick M.D.   On: 10/17/2022 15:46        Scheduled Meds:  apixaban  2.5 mg Oral BID   levothyroxine  25 mcg Oral Q0600   QUEtiapine  50 mg Oral QHS   rosuvastatin  20 mg Oral QPM   vancomycin variable dose per unstable renal function (pharmacist dosing)   Does not apply See admin instructions   Continuous Infusions:  sodium chloride 75 mL/hr at 10/17/22 1953   ceFEPime (MAXIPIME) IV       LOS: 1 day    Time spent: 35 minutes    Tresea Heine Hoover Brunette, DO [facility]  If 7PM-7AM, please contact night-coverage [email or web address] 10/18/2022, 7:03 AM

## 2022-10-18 NOTE — Plan of Care (Signed)
  Problem: Education: Goal: Knowledge of General Education information will improve Description Including pain rating scale, medication(s)/side effects and non-pharmacologic comfort measures Outcome: Progressing   Problem: Health Behavior/Discharge Planning: Goal: Ability to manage health-related needs will improve Outcome: Progressing   

## 2022-10-19 DIAGNOSIS — R6521 Severe sepsis with septic shock: Secondary | ICD-10-CM | POA: Diagnosis not present

## 2022-10-19 DIAGNOSIS — A419 Sepsis, unspecified organism: Secondary | ICD-10-CM | POA: Diagnosis not present

## 2022-10-19 LAB — BASIC METABOLIC PANEL
Anion gap: 7 (ref 5–15)
BUN: 25 mg/dL — ABNORMAL HIGH (ref 8–23)
CO2: 21 mmol/L — ABNORMAL LOW (ref 22–32)
Calcium: 7.1 mg/dL — ABNORMAL LOW (ref 8.9–10.3)
Chloride: 108 mmol/L (ref 98–111)
Creatinine, Ser: 1.85 mg/dL — ABNORMAL HIGH (ref 0.61–1.24)
GFR, Estimated: 35 mL/min — ABNORMAL LOW (ref >60)
Glucose, Bld: 92 mg/dL (ref 70–99)
Potassium: 4 mmol/L (ref 3.5–5.1)
Sodium: 136 mmol/L (ref 135–145)

## 2022-10-19 LAB — C-REACTIVE PROTEIN: CRP: 9.3 mg/dL — ABNORMAL HIGH (ref <1.0)

## 2022-10-19 LAB — CBC
HCT: 29.7 % — ABNORMAL LOW (ref 39.0–52.0)
Hemoglobin: 9.5 g/dL — ABNORMAL LOW (ref 13.0–17.0)
MCH: 28.4 pg (ref 26.0–34.0)
MCHC: 32 g/dL (ref 30.0–36.0)
MCV: 88.7 fL (ref 80.0–100.0)
Platelets: 364 10*3/uL (ref 150–400)
RBC: 3.35 MIL/uL — ABNORMAL LOW (ref 4.22–5.81)
RDW: 14.3 % (ref 11.5–15.5)
WBC: 10.3 10*3/uL (ref 4.0–10.5)
nRBC: 0 % (ref 0.0–0.2)

## 2022-10-19 LAB — SEDIMENTATION RATE: Sed Rate: 84 mm/h — ABNORMAL HIGH (ref 0–16)

## 2022-10-19 LAB — MAGNESIUM: Magnesium: 2 mg/dL (ref 1.7–2.4)

## 2022-10-19 MED ORDER — GERHARDT'S BUTT CREAM
TOPICAL_CREAM | Freq: Two times a day (BID) | CUTANEOUS | Status: DC
  Filled 2022-10-19 (×2): qty 1

## 2022-10-19 NOTE — Consult Note (Signed)
WOC Nurse Consult Note: Remote consult completed via review of the chart.  Reason for Consult: cellulitis to left lower leg and foot.  Failed outpatient antibiotic therapy.  Covid positive.   Wound type:infectious Pressure Injury POA:NA Measurement:RN to obtain measurements and place in the flowsheet.  Left dorsal foot:  8 cm x 6 cm darkened wound bed with surrounding erythema that extends to lower leg below knee.  Moisture associated skin damage to buttocks. Bilateral gluteal folds with partial thickness tissue loss.  Wound bed: buttocks pink and moist LEft foot black and devitalized tissue with surrounding erythema.  Drainage (amount, consistency, odor) minimal serosanguinous   Periwound: edema and erythema to left lower leg and foot.   Dressing procedure/placement/frequency: Cleanse left foot with VASHE wound cleanser  and pat dry. Apply VASHE moist gauze to scattered scabbed lesions on foot and leg.  TOp with dry gauze.  Wrap left leg with kerlix from below toes to below knee.  Secure with ace wrap.  CHange daily.  GErhardts butt paste to buttocks twice daily.  Will not follow at this time.  Please re-consult if needed.  Mike Gip MSN, RN, FNP-BC CWON Wound, Ostomy, Continence Nurse [facility] [phone] Pager [phone]

## 2022-10-19 NOTE — Progress Notes (Signed)
PROGRESS NOTE    Curtis Jenkins  ZOX:[id number] DOB: 27-Nov-1935 DOA: 10/17/2022 PCP: Elfredia Nevins, MD   Brief Narrative:    Curtis Jenkins is a 87 y.o. male with medical history significant of hypertension, stage III chronic kidney disease, atrial fibrillation on anticoagulation, hyperlipidemia, history of stroke with residual left-sided weakness.  Patient presents with fever, swelling of his left leg with erythema and pain.  With him starting fevers last week, he was started on Paxlovid to cover COVID, then was given doxycycline, then clindamycin for cellulitis.  As his symptoms were not improving, his daughter called EMS who brought him here for evaluation.  When EMS arrived, he was hypotensive.  They gave him some IV fluids with some improvement of his hypotension.   Assessment & Plan:   Principal Problem:   Septic shock (HCC) Active Problems:   HTN (hypertension)   PAF (paroxysmal atrial fibrillation) (HCC)   Pressure injury of skin   Acute-on-chronic kidney injury (HCC)   Cellulitis  Assessment and Plan:  Sepsis DC IV fluid Blood cultures obtained with no growth to date Cellulitis At this point, the patient has failed outpatient treatment being on 2 different antibiotics without improvement. Continue cefepime only as MRSA negative Repeat CBC in the morning MRI with no signs of osteomyelitis and ABI negative for significant PAD Appreciate wound care evaluation and obtain ESR and CRP PAF On anticoagulation Pressure ulcer of skin Wound care Acute kidney injury on chronic kidney disease Improved, continue to monitor in a.m. without IV fluid 6. Obesity-BMI 35.16    DVT prophylaxis:apixaban Code Status: DNR Family Communication: Daughter at bedside 10/6 Disposition Plan: Continue IV antibiotics Status is: Inpatient Remains inpatient appropriate because: Need for ongoing IV medications.   Skin Assessment:  I have examined the patient's skin and I agree with the  wound assessment as performed by the wound care RN as outlined below:  Pressure Injury 10/17/22 Buttocks Stage 2 -  Partial thickness loss of dermis presenting as a shallow open injury with a red, pink wound bed without slough. (Active)  10/17/22 1831  Location: Buttocks  Location Orientation:   Staging: Stage 2 -  Partial thickness loss of dermis presenting as a shallow open injury with a red, pink wound bed without slough.  Wound Description (Comments):   Present on Admission: Yes  Dressing Type Foam - Lift dressing to assess site every shift 10/18/22 1947    Consultants:  None  Procedures:  None  Antimicrobials:  Anti-infectives (From admission, onward)    Start     Dose/Rate Route Frequency Ordered Stop   10/19/22 1500  vancomycin (VANCOREADY) IVPB 1500 mg/300 mL  Status:  Discontinued        1,500 mg 150 mL/hr over 120 Minutes Intravenous Every 48 hours 10/18/22 1032 10/19/22 0826   10/18/22 1500  ceFEPIme (MAXIPIME) 2 g in sodium chloride 0.9 % 100 mL IVPB  Status:  Discontinued        2 g 200 mL/hr over 30 Minutes Intravenous Every 24 hours 10/17/22 1956 10/18/22 1032   10/18/22 1200  ceFEPIme (MAXIPIME) 2 g in sodium chloride 0.9 % 100 mL IVPB        2 g 200 mL/hr over 30 Minutes Intravenous Every 12 hours 10/18/22 1032     10/17/22 1952  vancomycin variable dose per unstable renal function (pharmacist dosing)  Status:  Discontinued         Does not apply See admin instructions 10/17/22 1953 10/18/22 1032  10/17/22 1500  vancomycin (VANCOREADY) IVPB 2000 mg/400 mL        2,000 mg 200 mL/hr over 120 Minutes Intravenous  Once 10/17/22 1445 10/17/22 1719   10/17/22 1445  vancomycin (VANCOCIN) IVPB 1000 mg/200 mL premix  Status:  Discontinued        1,000 mg 200 mL/hr over 60 Minutes Intravenous  Once 10/17/22 1443 10/17/22 1445   10/17/22 1445  ceFEPIme (MAXIPIME) 2 g in sodium chloride 0.9 % 100 mL IVPB        2 g 200 mL/hr over 30 Minutes Intravenous  Once 10/17/22  1443 10/17/22 1533      Subjective: Patient seen and evaluated today with no new acute complaints or concerns. No acute concerns or events noted overnight.  Cellulitis appears to be looking better today.  Objective: Vitals:   10/18/22 0429 10/18/22 1412 10/18/22 1947 10/19/22 0515  BP: (!) 106/57 115/74 119/70 (!) 142/77  Pulse: 76 69 74 70  Resp: 16     Temp: 98.4 F (36.9 C) 98.3 F (36.8 C) 98.4 F (36.9 C) 98.6 F (37 C)  TempSrc:  Oral Oral Oral  SpO2: 100% 100% 98% 99%  Weight:      Height:        Intake/Output Summary (Last 24 hours) at 10/19/2022 1033 Last data filed at 10/18/2022 2025 Gross per 24 hour  Intake 720 ml  Output 700 ml  Net 20 ml   Filed Weights   10/17/22 1351 10/17/22 1939  Weight: 108 kg 108 kg    Examination:  General exam: Appears calm and comfortable  Respiratory system: Clear to auscultation. Respiratory effort normal. Cardiovascular system: S1 & S2 heard, RRR.  Gastrointestinal system: Abdomen is soft Central nervous system: Alert and awake Extremities:   Psychiatry: Flat affect.    Data Reviewed: I have personally reviewed following labs and imaging studies  CBC: Recent Labs  Lab 10/17/22 1418 10/18/22 0605 10/19/22 0527  WBC 14.0* 11.0* 10.3  HGB 10.6* 9.4* 9.5*  HCT 32.5* 28.8* 29.7*  MCV 88.3 88.9 88.7  PLT 425* 292 364   Basic Metabolic Panel: Recent Labs  Lab 10/17/22 1418 10/18/22 0605 10/19/22 0527  NA 133* 134* 136  K 4.1 4.2 4.0  CL 101 108 108  CO2 20* 18* 21*  GLUCOSE 131* 90 92  BUN 32* 26* 25*  CREATININE 2.45* 2.08* 1.85*  CALCIUM 7.4* 6.9* 7.1*  MG  --   --  2.0   GFR: Estimated Creatinine Clearance: 35.3 mL/min (A) (by C-G formula based on SCr of 1.85 mg/dL (H)). Liver Function Tests: No results for input(s): "AST", "ALT", "ALKPHOS", "BILITOT", "PROT", "ALBUMIN" in the last 168 hours. No results for input(s): "LIPASE", "AMYLASE" in the last 168 hours. No results for input(s): "AMMONIA" in  the last 168 hours. Coagulation Profile: No results for input(s): "INR", "PROTIME" in the last 168 hours. Cardiac Enzymes: No results for input(s): "CKTOTAL", "CKMB", "CKMBINDEX", "TROPONINI" in the last 168 hours. BNP (last 3 results) No results for input(s): "PROBNP" in the last 8760 hours. HbA1C: No results for input(s): "HGBA1C" in the last 72 hours. CBG: No results for input(s): "GLUCAP" in the last 168 hours. Lipid Profile: No results for input(s): "CHOL", "HDL", "LDLCALC", "TRIG", "CHOLHDL", "LDLDIRECT" in the last 72 hours. Thyroid Function Tests: No results for input(s): "TSH", "T4TOTAL", "FREET4", "T3FREE", "THYROIDAB" in the last 72 hours. Anemia Panel: No results for input(s): "VITAMINB12", "FOLATE", "FERRITIN", "TIBC", "IRON", "RETICCTPCT" in the last 72 hours. Sepsis Labs: Recent  Labs  Lab 10/17/22 1418 10/17/22 1623  LATICACIDVEN 1.6 1.4    Recent Results (from the past 240 hour(s))  Blood culture (routine x 2)     Status: None (Preliminary result)   Collection Time: 10/17/22  2:18 PM   Specimen: BLOOD  Result Value Ref Range Status   Specimen Description BLOOD RIGHT ANTECUBITAL  Final   Special Requests   Final    BOTTLES DRAWN AEROBIC AND ANAEROBIC Blood Culture results may not be optimal due to an excessive volume of blood received in culture bottles   Culture   Final    NO GROWTH 2 DAYS Performed at [facility], [address] [place], [place] [address]    Report Status PENDING  Incomplete  Blood culture (routine x 2)     Status: None (Preliminary result)   Collection Time: 10/17/22  2:18 PM   Specimen: BLOOD  Result Value Ref Range Status   Specimen Description BLOOD LEFT ANTECUBITAL  Final   Special Requests   Final    BOTTLES DRAWN AEROBIC AND ANAEROBIC Blood Culture results may not be optimal due to an excessive volume of blood received in culture bottles   Culture   Final    NO GROWTH 2 DAYS Performed at [facility], [address],  [place], [place] [address]    Report Status PENDING  Incomplete  MRSA Next Gen by PCR, Nasal     Status: None   Collection Time: 10/17/22  6:22 PM   Specimen: Nasal Mucosa; Nasal Swab  Result Value Ref Range Status   MRSA by PCR Next Gen NOT DETECTED NOT DETECTED Final    Comment: (NOTE) The GeneXpert MRSA Assay (FDA approved for NASAL specimens only), is one component of a comprehensive MRSA colonization surveillance program. It is not intended to diagnose MRSA infection nor to guide or monitor treatment for MRSA infections. Test performance is not FDA approved in patients less than 71 years old. Performed at [facility], [address], [place], [place] [address]          Radiology Studies: US ARTERIAL ABI (SCREENING LOWER EXTREMITY)  Result Date: 10/18/2022 CLINICAL DATA:  Cellulitis EXAM: NONINVASIVE PHYSIOLOGIC VASCULAR STUDY OF BILATERAL LOWER EXTREMITIES TECHNIQUE: Evaluation of both lower extremities were performed at rest, including calculation of ankle-brachial indices with single level Doppler, pressure and pulse volume recording. COMPARISON:  07/13/2019 FINDINGS: Right ABI:  0.93 (previously 1.24) Left ABI:  0.82 (previously 1.0) Right Lower Extremity:  Monophasic waveforms distally. Left Lower Extremity:  Monophasic waveforms distally. IMPRESSION: Borderline right and mild left lower extremity arterial occlusive disease at rest, progressive since previous. Electronically Signed   By: Corlis Leak M.D.   On: 10/18/2022 17:20   MR FOOT LEFT W WO CONTRAST  Result Date: 10/18/2022 CLINICAL DATA:  Left lower extremity cellulitis. Foot swelling, redness, warmth, and weeping. EXAM: MRI OF THE LEFT FOREFOOT WITHOUT AND WITH CONTRAST TECHNIQUE: Multiplanar, multisequence MR imaging of the left foot was performed both before and after administration of intravenous contrast. CONTRAST:  10mL GADAVIST GADOBUTROL 1 MMOL/ML IV SOLN COMPARISON:  Left ankle and foot radiographs FINDINGS: Despite  efforts by the technologist and patient, high-grade motion artifact is present on today's exam and could not be eliminated. Large field-of-view imaging of the whole foot also decreases resolution. This reduces exam sensitivity and specificity. Bones/Joint/Cartilage Within the limitations of the high-grade motion artifact, no gross cortical erosion is identified. Ligaments The Lisfranc ligament complex is grossly intact. Muscles and Tendons There is  moderate atrophy and fatty infiltration of the abductor digiti minimi muscle. This can be seen with chronic x-rays nerve impingement. No definite flexor or extensor tendon tear is seen. Soft tissues There is my diffuse subcutaneous fat edema and swelling throughout the hindfoot and forefoot, greatest within the lateral hindfoot and dorsal midfoot and forefoot. Within the limitations of motion artifact, no definite walled-off enhancing abscess is identified. IMPRESSION: 1. Unfortunately, there is high-grade motion artifact on this exam despite repetition of multiple sequences. Large field-of-view imaging of the entire foot also decreases resolution. 2. There is mild-to-moderate diffuse subcutaneous fat edema and swelling compatible with cellulitis. Within the limitations of motion artifact, no definite walled-off enhancing abscess is identified. 3. Within the limitations of the high-grade motion artifact, no gross cortical erosion is identified to suggest osteomyelitis. Electronically Signed   By: Neita Garnet M.D.   On: 10/18/2022 13:14   DG Foot Complete Left  Result Date: 10/17/2022 CLINICAL DATA:  cellulitis. Pain/redness and swelling of distal lower leg into the ankle and foot. EXAM: LEFT FOOT - COMPLETE 3+ VIEW; LEFT ANKLE COMPLETE - 3+ VIEW COMPARISON:  None Available. FINDINGS: No acute fracture or dislocation. No aggressive osseous lesion. Note is made of hammertoe deformity of toes. Ankle mortise appears intact. Mild diffuse degenerative changes of imaged  joints. Calcaneal spur noted along the Plantar aponeurosis attachment site. Mild diffuse swelling around ankle joint. No evidence of air within the soft tissue. No radiopaque foreign bodies. IMPRESSION: 1. Mild diffuse swelling around the ankle joint. No radiographic evidence of osteomyelitis. 2. Mild diffuse degenerative changes. Calcaneal spur. Electronically Signed   By: Jules Schick M.D.   On: 10/17/2022 15:46   DG Ankle Complete Left  Result Date: 10/17/2022 CLINICAL DATA:  cellulitis. Pain/redness and swelling of distal lower leg into the ankle and foot. EXAM: LEFT FOOT - COMPLETE 3+ VIEW; LEFT ANKLE COMPLETE - 3+ VIEW COMPARISON:  None Available. FINDINGS: No acute fracture or dislocation. No aggressive osseous lesion. Note is made of hammertoe deformity of toes. Ankle mortise appears intact. Mild diffuse degenerative changes of imaged joints. Calcaneal spur noted along the Plantar aponeurosis attachment site. Mild diffuse swelling around ankle joint. No evidence of air within the soft tissue. No radiopaque foreign bodies. IMPRESSION: 1. Mild diffuse swelling around the ankle joint. No radiographic evidence of osteomyelitis. 2. Mild diffuse degenerative changes. Calcaneal spur. Electronically Signed   By: Jules Schick M.D.   On: 10/17/2022 15:46        Scheduled Meds:  apixaban  2.5 mg Oral BID   levothyroxine  25 mcg Oral Q0600   QUEtiapine  50 mg Oral QHS   rosuvastatin  20 mg Oral QPM   Continuous Infusions:  ceFEPime (MAXIPIME) IV 2 g (10/18/22 2315)     LOS: 2 days    Time spent: 35 minutes    Terianna Peggs Hoover Brunette, DO [facility]  If 7PM-7AM, please contact night-coverage [email or web address] 10/19/2022, 10:33 AM

## 2022-10-19 NOTE — Evaluation (Signed)
Physical Therapy Evaluation Patient Details Name: Curtis Jenkins MRN: [id number] DOB: 1935/09/13 Today's Date: 10/19/2022  History of Present Illness  Per MD: Curtis Jenkins is a 87 y.o. male with medical history significant of hypertension, stage III chronic kidney disease, atrial fibrillation on anticoagulation, hyperlipidemia, history of stroke with residual left-sided weakness.  Patient presents with fever, swelling of his left leg with erythema and pain.  With him starting fevers last week, he was started on Paxlovid to cover COVID, then was given doxycycline, then clindamycin for cellulitis.  As his symptoms were not improving, his daughter called EMS who brought him here for evaluation.  When EMS arrived, he was hypotensive.  They gave him some IV fluids with some improvement of his hypotension.  He does not have diabetes.    Clinical Impression  Pt is at prior functioning level.  Family present during eval and daughter wanted to show therapist how she transferred father.  Able to transfer with good body mechanics.  Daughter states effort was no greater than normal.       If plan is discharge home, recommend the following: A little help with walking and/or transfers;Assistance with cooking/housework   Can travel by private vehicle    yes    Equipment Recommendations None recommended by PT  Recommendations for Other Services       Functional Status Assessment Patient has not had a recent decline in their functional status     Precautions / Restrictions Precautions Precautions: None Restrictions Weight Bearing Restrictions: No      Mobility  Bed Mobility Overal bed mobility: Needs Assistance Bed Mobility: Supine to Sit, Sit to Supine     Supine to sit: Mod assist Sit to supine: Mod assist   General bed mobility comments: Per family this is prior level    Transfers Overall transfer level: Needs assistance Equipment used: Rolling walker (2 wheels), 1 person hand  held assist Transfers: Sit to/from Stand Sit to Stand: Min assist           General transfer comment: per family this is prior level    Ambulation/Gait               General Gait Details: per family has not ambulated in years         Pertinent Vitals/Pain Pain Assessment Pain Assessment: Faces Faces Pain Scale: Hurts a little bit Pain Location: foot    Home Living Family/patient expects to be discharged to:: Private residence Living Arrangements: Spouse/significant other;Children Available Help at Discharge: Available 24 hours/day Type of Home: House Home Access: Level entry       Home Layout: Able to live on main level with bedroom/bathroom;Multi-level Home Equipment: Standard Walker;BSC/3in1;Grab bars - toilet      Prior Function Prior Level of Function : Needs assist             Mobility Comments: Pt is non ambulatory assists with transfers.       Extremity/Trunk Assessment                Communication    At baseline   Cognition Arousal: Alert Behavior During Therapy: WFL for tasks assessed/performed Overall Cognitive Status: Within Functional Limits for tasks assessed  Assessment/Plan    PT Assessment Patient does not need any further PT services  PT Problem List   PT has difficulty with transfer but is at baseline.                 AM-PAC PT "6 Clicks" Mobility  Outcome Measure Help needed turning from your back to your side while in a flat bed without using bedrails?: A Little Help needed moving from lying on your back to sitting on the side of a flat bed without using bedrails?: A Little Help needed moving to and from a bed to a chair (including a wheelchair)?: A Little Help needed standing up from a chair using your arms (e.g., wheelchair or bedside chair)?: A Little Help needed to walk in hospital room?: Total Help needed climbing 3-5 steps with a  railing? : Total 6 Click Score: 14    End of Session   Activity Tolerance: Patient tolerated treatment well Patient left: in chair;with family/visitor present;with call bell/phone within reach   PT Visit Diagnosis: Other abnormalities of gait and mobility (R26.89)    Time: 1015-1040 PT Time Calculation (min) (ACUTE ONLY): 25 min   Charges:   PT Evaluation $PT Eval Low Complexity: 1 Low PT Treatments $Therapeutic Activity: 8-22 mins PT General Charges $$ ACUTE PT VISIT: 1 Visit       Virgina Organ, PT CLT [phone]   10/19/2022, 11:10 AM

## 2022-10-20 DIAGNOSIS — R6521 Severe sepsis with septic shock: Secondary | ICD-10-CM | POA: Diagnosis not present

## 2022-10-20 DIAGNOSIS — A419 Sepsis, unspecified organism: Secondary | ICD-10-CM | POA: Diagnosis not present

## 2022-10-20 LAB — BASIC METABOLIC PANEL
Anion gap: 9 (ref 5–15)
BUN: 21 mg/dL (ref 8–23)
CO2: 20 mmol/L — ABNORMAL LOW (ref 22–32)
Calcium: 7.2 mg/dL — ABNORMAL LOW (ref 8.9–10.3)
Chloride: 106 mmol/L (ref 98–111)
Creatinine, Ser: 1.74 mg/dL — ABNORMAL HIGH (ref 0.61–1.24)
GFR, Estimated: 38 mL/min — ABNORMAL LOW (ref >60)
Glucose, Bld: 93 mg/dL (ref 70–99)
Potassium: 3.9 mmol/L (ref 3.5–5.1)
Sodium: 135 mmol/L (ref 135–145)

## 2022-10-20 LAB — C-REACTIVE PROTEIN: CRP: 5.9 mg/dL — ABNORMAL HIGH (ref <1.0)

## 2022-10-20 LAB — CBC
HCT: 29.5 % — ABNORMAL LOW (ref 39.0–52.0)
Hemoglobin: 9.6 g/dL — ABNORMAL LOW (ref 13.0–17.0)
MCH: 28.7 pg (ref 26.0–34.0)
MCHC: 32.5 g/dL (ref 30.0–36.0)
MCV: 88.1 fL (ref 80.0–100.0)
Platelets: 368 10*3/uL (ref 150–400)
RBC: 3.35 MIL/uL — ABNORMAL LOW (ref 4.22–5.81)
RDW: 14.5 % (ref 11.5–15.5)
WBC: 9.6 10*3/uL (ref 4.0–10.5)
nRBC: 0 % (ref 0.0–0.2)

## 2022-10-20 LAB — SEDIMENTATION RATE: Sed Rate: 88 mm/h — ABNORMAL HIGH (ref 0–16)

## 2022-10-20 LAB — MAGNESIUM: Magnesium: 2 mg/dL (ref 1.7–2.4)

## 2022-10-20 MED ORDER — BISACODYL 10 MG RE SUPP
10.0000 mg | Freq: Once | RECTAL | Status: AC
  Administered 2022-10-20: 10 mg via RECTAL
  Filled 2022-10-20: qty 1

## 2022-10-20 MED ORDER — DOXYCYCLINE HYCLATE 100 MG PO TABS: 100.0000 mg | ORAL_TABLET | Freq: Two times a day (BID) | ORAL | 0 refills | Status: AC

## 2022-10-20 MED ORDER — CEPHALEXIN 500 MG PO CAPS: 500.0000 mg | ORAL_CAPSULE | Freq: Four times a day (QID) | ORAL | 0 refills | Status: AC

## 2022-10-20 MED ORDER — APIXABAN 2.5 MG PO TABS: 2.5000 mg | ORAL_TABLET | Freq: Two times a day (BID) | ORAL | 1 refills | Status: DC

## 2022-10-20 NOTE — Discharge Summary (Signed)
Physician Discharge Summary  Curtis Jenkins NWG:[id number] DOB: 1935-06-04 DOA: 10/17/2022  PCP: Elfredia Nevins, MD  Admit date: 10/17/2022  Discharge date: 10/20/2022  Admitted From:Home  Disposition:  Home  Recommendations for Outpatient Follow-up:  Follow up with PCP in 1-2 weeks Continue on antibiotics with doxycycline and Keflex as prescribed for 11 more days to complete total 14-day course of treatment Continue other home medications as prior and reduce Eliquis dose to 2.5 mg twice daily on account of renal function  Home Health: Yes with PT/OT, RN  Equipment/Devices: None  Discharge Condition:Stable  CODE STATUS: DNR  Diet recommendation: Heart Healthy  Brief/Interim Summary:  Curtis Jenkins is a 87 y.o. male with medical history significant of hypertension, stage III chronic kidney disease, atrial fibrillation on anticoagulation, hyperlipidemia, history of stroke with residual left-sided weakness.  Patient presents with fever, swelling of his left leg with erythema and pain.  With him starting fevers last week, he was started on Paxlovid to cover COVID, then was given doxycycline, then clindamycin for cellulitis.  As his symptoms were not improving, his daughter called EMS who brought him here for evaluation.  When EMS arrived, he was hypotensive.  They gave him some IV fluids with some improvement of his hypotension.  His cellulitis has markedly improved with the use of vancomycin and cefepime and his MRSA nares was negative.  No growth on blood cultures noted.  MRI of the lower extremity had significant motion artifact, however did not appear to demonstrate any findings of osteomyelitis.  Additionally his ABI studies indicate mild PAD.  He appears to be much improved and now in stable condition for discharge on oral antibiotics with doxycycline and Keflex as prescribed.  No other acute events or concerns noted throughout the course of this admission.  Discharge Diagnoses:   Principal Problem:   Septic shock (HCC) Active Problems:   HTN (hypertension)   PAF (paroxysmal atrial fibrillation) (HCC)   Pressure injury of skin   Acute-on-chronic kidney injury (HCC)   Cellulitis  Principal discharge diagnosis: Sepsis, present on admission secondary to left lower extremity cellulitis.  Discharge Instructions  Discharge Instructions     Diet - low sodium heart healthy   Complete by: As directed    Discharge wound care:   Complete by: As directed    Cleanse left foot with VASHE wound cleanser  and pat dry. Apply VASHE moist gauze to scattered scabbed lesions on foot and leg.  TOp with dry gauze.  Wrap left leg with kerlix from below toes to below knee.  Secure with ace wrap.  CHange daily.  GErhardts butt paste to buttocks twice daily.   Increase activity slowly   Complete by: As directed       Allergies as of 10/20/2022   No Known Allergies      Medication List     STOP taking these medications    clindamycin 300 MG capsule Commonly known as: CLEOCIN   doxycycline 100 MG capsule Commonly known as: VIBRAMYCIN Replaced by: doxycycline 100 MG tablet   Paxlovid (150/100) 10 x 150 MG & 10 x 100MG  Tbpk Generic drug: nirmatrelvir/ritonavir (renal dosing)       TAKE these medications    amLODipine 5 MG tablet Commonly known as: NORVASC Take 5 mg by mouth daily.   apixaban 2.5 MG Tabs tablet Commonly known as: ELIQUIS Take 1 tablet (2.5 mg total) by mouth 2 (two) times daily. What changed:  medication strength how much to take  cephALEXin 500 MG capsule Commonly known as: KEFLEX Take 1 capsule (500 mg total) by mouth 4 (four) times daily for 11 days.   cetirizine 10 MG tablet Commonly known as: ZYRTEC Take 10 mg by mouth daily.   clotrimazole-betamethasone cream Commonly known as: LOTRISONE Apply 1 application topically 2 (two) times daily as needed (irritation/rash.).   doxycycline 100 MG tablet Commonly known as:  VIBRA-TABS Take 1 tablet (100 mg total) by mouth 2 (two) times daily for 11 days. Replaces: doxycycline 100 MG capsule   HYDROcodone-acetaminophen 10-325 MG tablet Commonly known as: NORCO Take 0.5-1 tablets by mouth 4 (four) times daily as needed for moderate pain.   hydrOXYzine 25 MG tablet Commonly known as: ATARAX Take 25 mg by mouth at bedtime.   levothyroxine 25 MCG tablet Commonly known as: SYNTHROID Take 25 mcg by mouth daily.   QUEtiapine 50 MG tablet Commonly known as: SEROQUEL Take 50 mg by mouth at bedtime.   rosuvastatin 20 MG tablet Commonly known as: CRESTOR Take 20 mg by mouth every evening.   traMADol 50 MG tablet Commonly known as: ULTRAM Take 1 tablet (50 mg total) by mouth 3 (three) times daily as needed (pain.). What changed: when to take this   Vitamin D3 125 MCG (5000 UT) Caps Take 5,000 Units by mouth daily.               Discharge Care Instructions  (From admission, onward)           Start     Ordered   10/20/22 0000  Discharge wound care:       Comments: Cleanse left foot with VASHE wound cleanser  and pat dry. Apply VASHE moist gauze to scattered scabbed lesions on foot and leg.  TOp with dry gauze.  Wrap left leg with kerlix from below toes to below knee.  Secure with ace wrap.  CHange daily.  GErhardts butt paste to buttocks twice daily.   10/20/22 1345            Follow-up Information     Care, [facility] Follow up.   Specialty: Home Health Services Why: HH RN/PT/OT will call to schedule first home visit. Contact information: [address] [place] [address] [phone]         Elfredia Nevins, MD. Schedule an appointment as soon as possible for a visit in 1 week(s).   Specialty: Internal Medicine Contact information: [address] [place] [address] [phone]                No Known Allergies  Consultations: None   Procedures/Studies: US ARTERIAL ABI  (SCREENING LOWER EXTREMITY)  Result Date: 10/18/2022 CLINICAL DATA:  Cellulitis EXAM: NONINVASIVE PHYSIOLOGIC VASCULAR STUDY OF BILATERAL LOWER EXTREMITIES TECHNIQUE: Evaluation of both lower extremities were performed at rest, including calculation of ankle-brachial indices with single level Doppler, pressure and pulse volume recording. COMPARISON:  07/13/2019 FINDINGS: Right ABI:  0.93 (previously 1.24) Left ABI:  0.82 (previously 1.0) Right Lower Extremity:  Monophasic waveforms distally. Left Lower Extremity:  Monophasic waveforms distally. IMPRESSION: Borderline right and mild left lower extremity arterial occlusive disease at rest, progressive since previous. Electronically Signed   By: Corlis Leak M.D.   On: 10/18/2022 17:20   MR FOOT LEFT W WO CONTRAST  Result Date: 10/18/2022 CLINICAL DATA:  Left lower extremity cellulitis. Foot swelling, redness, warmth, and weeping. EXAM: MRI OF THE LEFT FOREFOOT WITHOUT AND WITH CONTRAST TECHNIQUE: Multiplanar, multisequence MR imaging of the  left foot was performed both before and after administration of intravenous contrast. CONTRAST:  10mL GADAVIST GADOBUTROL 1 MMOL/ML IV SOLN COMPARISON:  Left ankle and foot radiographs FINDINGS: Despite efforts by the technologist and patient, high-grade motion artifact is present on today's exam and could not be eliminated. Large field-of-view imaging of the whole foot also decreases resolution. This reduces exam sensitivity and specificity. Bones/Joint/Cartilage Within the limitations of the high-grade motion artifact, no gross cortical erosion is identified. Ligaments The Lisfranc ligament complex is grossly intact. Muscles and Tendons There is moderate atrophy and fatty infiltration of the abductor digiti minimi muscle. This can be seen with chronic x-rays nerve impingement. No definite flexor or extensor tendon tear is seen. Soft tissues There is my diffuse subcutaneous fat edema and swelling throughout the hindfoot and  forefoot, greatest within the lateral hindfoot and dorsal midfoot and forefoot. Within the limitations of motion artifact, no definite walled-off enhancing abscess is identified. IMPRESSION: 1. Unfortunately, there is high-grade motion artifact on this exam despite repetition of multiple sequences. Large field-of-view imaging of the entire foot also decreases resolution. 2. There is mild-to-moderate diffuse subcutaneous fat edema and swelling compatible with cellulitis. Within the limitations of motion artifact, no definite walled-off enhancing abscess is identified. 3. Within the limitations of the high-grade motion artifact, no gross cortical erosion is identified to suggest osteomyelitis. Electronically Signed   By: Neita Garnet M.D.   On: 10/18/2022 13:14   DG Foot Complete Left  Result Date: 10/17/2022 CLINICAL DATA:  cellulitis. Pain/redness and swelling of distal lower leg into the ankle and foot. EXAM: LEFT FOOT - COMPLETE 3+ VIEW; LEFT ANKLE COMPLETE - 3+ VIEW COMPARISON:  None Available. FINDINGS: No acute fracture or dislocation. No aggressive osseous lesion. Note is made of hammertoe deformity of toes. Ankle mortise appears intact. Mild diffuse degenerative changes of imaged joints. Calcaneal spur noted along the Plantar aponeurosis attachment site. Mild diffuse swelling around ankle joint. No evidence of air within the soft tissue. No radiopaque foreign bodies. IMPRESSION: 1. Mild diffuse swelling around the ankle joint. No radiographic evidence of osteomyelitis. 2. Mild diffuse degenerative changes. Calcaneal spur. Electronically Signed   By: Jules Schick M.D.   On: 10/17/2022 15:46   DG Ankle Complete Left  Result Date: 10/17/2022 CLINICAL DATA:  cellulitis. Pain/redness and swelling of distal lower leg into the ankle and foot. EXAM: LEFT FOOT - COMPLETE 3+ VIEW; LEFT ANKLE COMPLETE - 3+ VIEW COMPARISON:  None Available. FINDINGS: No acute fracture or dislocation. No aggressive osseous  lesion. Note is made of hammertoe deformity of toes. Ankle mortise appears intact. Mild diffuse degenerative changes of imaged joints. Calcaneal spur noted along the Plantar aponeurosis attachment site. Mild diffuse swelling around ankle joint. No evidence of air within the soft tissue. No radiopaque foreign bodies. IMPRESSION: 1. Mild diffuse swelling around the ankle joint. No radiographic evidence of osteomyelitis. 2. Mild diffuse degenerative changes. Calcaneal spur. Electronically Signed   By: Jules Schick M.D.   On: 10/17/2022 15:46     Discharge Exam: Vitals:   10/20/22 0840 10/20/22 1330  BP: (!) 115/58 (!) 102/57  Pulse: 65 77  Resp:  12  Temp: 98.8 F (37.1 C) (!) 97.5 F (36.4 C)  SpO2: 99% 100%   Vitals:   10/19/22 2041 10/20/22 0532 10/20/22 0840 10/20/22 1330  BP: 131/78 98/75 (!) 115/58 (!) 102/57  Pulse: 75 67 65 77  Resp: 16 16  12   Temp: 99.7 F (37.6 C) 98.2 F (36.8 C)  98.8 F (37.1 C) (!) 97.5 F (36.4 C)  TempSrc: Oral Oral Oral Oral  SpO2: 100% 96% 99% 100%  Weight:      Height:        General: Pt is alert, awake, not in acute distress Cardiovascular: RRR, S1/S2 +, no rubs, no gallops Respiratory: CTA bilaterally, no wheezing, no rhonchi Abdominal: Soft, NT, ND, bowel sounds + Extremities: no edema, no cyanosis    The results of significant diagnostics from this hospitalization (including imaging, microbiology, ancillary and laboratory) are listed below for reference.     Microbiology: Recent Results (from the past 240 hour(s))  Blood culture (routine x 2)     Status: None (Preliminary result)   Collection Time: 10/17/22  2:18 PM   Specimen: BLOOD  Result Value Ref Range Status   Specimen Description BLOOD RIGHT ANTECUBITAL  Final   Special Requests   Final    BOTTLES DRAWN AEROBIC AND ANAEROBIC Blood Culture results may not be optimal due to an excessive volume of blood received in culture bottles   Culture   Final    NO GROWTH 3  DAYS Performed at [facility], [address] [place], [place] [address]    Report Status PENDING  Incomplete  Blood culture (routine x 2)     Status: None (Preliminary result)   Collection Time: 10/17/22  2:18 PM   Specimen: BLOOD  Result Value Ref Range Status   Specimen Description BLOOD LEFT ANTECUBITAL  Final   Special Requests   Final    BOTTLES DRAWN AEROBIC AND ANAEROBIC Blood Culture results may not be optimal due to an excessive volume of blood received in culture bottles   Culture   Final    NO GROWTH 3 DAYS Performed at [facility], [address], [place], [place] [address]    Report Status PENDING  Incomplete  MRSA Next Gen by PCR, Nasal     Status: None   Collection Time: 10/17/22  6:22 PM   Specimen: Nasal Mucosa; Nasal Swab  Result Value Ref Range Status   MRSA by PCR Next Gen NOT DETECTED NOT DETECTED Final    Comment: (NOTE) The GeneXpert MRSA Assay (FDA approved for NASAL specimens only), is one component of a comprehensive MRSA colonization surveillance program. It is not intended to diagnose MRSA infection nor to guide or monitor treatment for MRSA infections. Test performance is not FDA approved in patients less than 105 years old. Performed at [facility], [address] [place], [place] [address]      Labs: BNP (last 3 results) No results for input(s): "BNP" in the last 8760 hours. Basic Metabolic Panel: Recent Labs  Lab 10/17/22 1418 10/18/22 0605 10/19/22 0527 10/20/22 0337  NA 133* 134* 136 135  K 4.1 4.2 4.0 3.9  CL 101 108 108 106  CO2 20* 18* 21* 20*  GLUCOSE 131* 90 92 93  BUN 32* 26* 25* 21  CREATININE 2.45* 2.08* 1.85* 1.74*  CALCIUM 7.4* 6.9* 7.1* 7.2*  MG  --   --  2.0 2.0   Liver Function Tests: No results for input(s): "AST", "ALT", "ALKPHOS", "BILITOT", "PROT", "ALBUMIN" in the last 168 hours. No results for input(s): "LIPASE", "AMYLASE" in the last 168 hours. No results for input(s): "AMMONIA" in the last 168  hours. CBC: Recent Labs  Lab 10/17/22 1418 10/18/22 0605 10/19/22 0527 10/20/22 0337  WBC 14.0* 11.0* 10.3 9.6  HGB 10.6* 9.4* 9.5* 9.6*  HCT 32.5* 28.8* 29.7* 29.5*  MCV 88.3  88.9 88.7 88.1  PLT 425* 292 364 368   Cardiac Enzymes: No results for input(s): "CKTOTAL", "CKMB", "CKMBINDEX", "TROPONINI" in the last 168 hours. BNP: Invalid input(s): "POCBNP" CBG: No results for input(s): "GLUCAP" in the last 168 hours. D-Dimer No results for input(s): "DDIMER" in the last 72 hours. Hgb A1c No results for input(s): "HGBA1C" in the last 72 hours. Lipid Profile No results for input(s): "CHOL", "HDL", "LDLCALC", "TRIG", "CHOLHDL", "LDLDIRECT" in the last 72 hours. Thyroid function studies No results for input(s): "TSH", "T4TOTAL", "T3FREE", "THYROIDAB" in the last 72 hours.  Invalid input(s): "FREET3" Anemia work up No results for input(s): "VITAMINB12", "FOLATE", "FERRITIN", "TIBC", "IRON", "RETICCTPCT" in the last 72 hours. Urinalysis    Component Value Date/Time   COLORURINE YELLOW 02/10/2020 1830   APPEARANCEUR Clear 08/01/2020 1540   LABSPEC 1.018 02/10/2020 1830   PHURINE 5.0 02/10/2020 1830   GLUCOSEU Negative 08/01/2020 1540   HGBUR MODERATE (A) 02/10/2020 1830   BILIRUBINUR Negative 08/01/2020 1540   KETONESUR NEGATIVE 02/10/2020 1830   PROTEINUR 2+ (A) 08/01/2020 1540   PROTEINUR 100 (A) 02/10/2020 1830   UROBILINOGEN 0.2 05/20/2019 1137   UROBILINOGEN 0.2 04/11/2010 1329   NITRITE Negative 08/01/2020 1540   NITRITE NEGATIVE 02/10/2020 1830   LEUKOCYTESUR 3+ (A) 08/01/2020 1540   LEUKOCYTESUR LARGE (A) 02/10/2020 1830   Sepsis Labs Recent Labs  Lab 10/17/22 1418 10/18/22 0605 10/19/22 0527 10/20/22 0337  WBC 14.0* 11.0* 10.3 9.6   Microbiology Recent Results (from the past 240 hour(s))  Blood culture (routine x 2)     Status: None (Preliminary result)   Collection Time: 10/17/22  2:18 PM   Specimen: BLOOD  Result Value Ref Range Status   Specimen  Description BLOOD RIGHT ANTECUBITAL  Final   Special Requests   Final    BOTTLES DRAWN AEROBIC AND ANAEROBIC Blood Culture results may not be optimal due to an excessive volume of blood received in culture bottles   Culture   Final    NO GROWTH 3 DAYS Performed at [facility], [address], [place], [place] [address]    Report Status PENDING  Incomplete  Blood culture (routine x 2)     Status: None (Preliminary result)   Collection Time: 10/17/22  2:18 PM   Specimen: BLOOD  Result Value Ref Range Status   Specimen Description BLOOD LEFT ANTECUBITAL  Final   Special Requests   Final    BOTTLES DRAWN AEROBIC AND ANAEROBIC Blood Culture results may not be optimal due to an excessive volume of blood received in culture bottles   Culture   Final    NO GROWTH 3 DAYS Performed at [facility], [address] [place], [place] [address]    Report Status PENDING  Incomplete  MRSA Next Gen by PCR, Nasal     Status: None   Collection Time: 10/17/22  6:22 PM   Specimen: Nasal Mucosa; Nasal Swab  Result Value Ref Range Status   MRSA by PCR Next Gen NOT DETECTED NOT DETECTED Final    Comment: (NOTE) The GeneXpert MRSA Assay (FDA approved for NASAL specimens only), is one component of a comprehensive MRSA colonization surveillance program. It is not intended to diagnose MRSA infection nor to guide or monitor treatment for MRSA infections. Test performance is not FDA approved in patients less than 18 years old. Performed at [facility], [address] [place], [place] [address]      Time coordinating discharge: 35 minutes  SIGNED:   Crockett Rallo D  Sherryll Burger, DO [facility] 10/20/2022, 1:52 PM  If 7PM-7AM, please contact night-coverage [email or web address]

## 2022-10-20 NOTE — Progress Notes (Signed)
Patient discharged home with instructions given on medications and follow up visits,patient and family verbalized understanding.Prescriptions sent to Pharmacy of choice documented on AVS. IV discontinued,catheter intact. [facility] to transport patient home. Family at the bedside.

## 2022-10-20 NOTE — Plan of Care (Signed)
  Problem: Clinical Measurements: Goal: Will remain free from infection Outcome: Progressing   Problem: Pain Managment: Goal: General experience of comfort will improve Outcome: Progressing   Problem: Skin Integrity: Goal: Risk for impaired skin integrity will decrease Outcome: Progressing   Problem: Clinical Measurements: Goal: Ability to avoid or minimize complications of infection will improve Outcome: Progressing

## 2022-10-20 NOTE — Care Management Important Message (Signed)
Important Message  Patient Details  Name: Curtis Jenkins MRN: [id number] Date of Birth: 07/09/1935   Important Message Given:  N/A - LOS <3 / Initial given by admissions     Corey Harold 10/20/2022, 10:42 AM

## 2022-10-20 NOTE — TOC Transition Note (Signed)
Transition of Care [facility]) - CM/SW Discharge Note   Patient Details  Name: Curtis Jenkins MRN: [id number] Date of Birth: [birth year]  Transition of Care [facility]) CM/SW Contact:  Rocquel Askren A , RN Phone Number: 10/20/2022, 12:39 PM   Clinical Narrative:    Orders for RN/PT/OT -Referral sent. Denyse Amass Accepted pt. Patient discharged with HH-RN/PT/OT. Daughter made aware.      Patient Goals and CMS Choice      Discharge Placement                         Discharge Plan and Services Additional resources added to the After Visit Summary for                                       Social Determinants of Health (SDOH) Interventions SDOH Screenings   Food Insecurity: No Food Insecurity (10/17/2022)  Housing: Low Risk  (10/17/2022)  Transportation Needs: No Transportation Needs (10/17/2022)  Utilities: Not At Risk (10/17/2022)  Depression (PHQ2-9): Low Risk  (10/27/2019)  Financial Resource Strain: Low Risk  (09/03/2021)  Tobacco Use: Medium Risk (10/17/2022)     Readmission Risk Interventions     No data to display

## 2022-10-22 LAB — CULTURE, BLOOD (ROUTINE X 2)

## 2022-10-25 DIAGNOSIS — R6520 Severe sepsis without septic shock: Secondary | ICD-10-CM | POA: Diagnosis not present

## 2022-10-25 DIAGNOSIS — I129 Hypertensive chronic kidney disease with stage 1 through stage 4 chronic kidney disease, or unspecified chronic kidney disease: Secondary | ICD-10-CM | POA: Diagnosis not present

## 2022-10-25 DIAGNOSIS — L03116 Cellulitis of left lower limb: Secondary | ICD-10-CM | POA: Diagnosis not present

## 2022-10-25 DIAGNOSIS — N183 Chronic kidney disease, stage 3 unspecified: Secondary | ICD-10-CM | POA: Diagnosis not present

## 2022-10-25 DIAGNOSIS — A419 Sepsis, unspecified organism: Secondary | ICD-10-CM | POA: Diagnosis not present

## 2022-10-27 ENCOUNTER — Ambulatory Visit (INDEPENDENT_AMBULATORY_CARE_PROVIDER_SITE_OTHER): Payer: Medicare Other

## 2022-10-27 DIAGNOSIS — I63531 Cerebral infarction due to unspecified occlusion or stenosis of right posterior cerebral artery: Secondary | ICD-10-CM

## 2022-10-27 DIAGNOSIS — N183 Chronic kidney disease, stage 3 unspecified: Secondary | ICD-10-CM | POA: Diagnosis not present

## 2022-10-27 DIAGNOSIS — I129 Hypertensive chronic kidney disease with stage 1 through stage 4 chronic kidney disease, or unspecified chronic kidney disease: Secondary | ICD-10-CM | POA: Diagnosis not present

## 2022-10-27 DIAGNOSIS — A419 Sepsis, unspecified organism: Secondary | ICD-10-CM | POA: Diagnosis not present

## 2022-10-27 DIAGNOSIS — R6520 Severe sepsis without septic shock: Secondary | ICD-10-CM | POA: Diagnosis not present

## 2022-10-27 DIAGNOSIS — L03116 Cellulitis of left lower limb: Secondary | ICD-10-CM | POA: Diagnosis not present

## 2022-10-28 LAB — CUP PACEART REMOTE DEVICE CHECK
Date Time Interrogation Session: 20241013231059
Implantable Pulse Generator Implant Date: 20210330

## 2022-10-30 DIAGNOSIS — I129 Hypertensive chronic kidney disease with stage 1 through stage 4 chronic kidney disease, or unspecified chronic kidney disease: Secondary | ICD-10-CM | POA: Diagnosis not present

## 2022-10-30 DIAGNOSIS — A419 Sepsis, unspecified organism: Secondary | ICD-10-CM | POA: Diagnosis not present

## 2022-10-30 DIAGNOSIS — L03116 Cellulitis of left lower limb: Secondary | ICD-10-CM | POA: Diagnosis not present

## 2022-10-30 DIAGNOSIS — N183 Chronic kidney disease, stage 3 unspecified: Secondary | ICD-10-CM | POA: Diagnosis not present

## 2022-10-30 DIAGNOSIS — R6520 Severe sepsis without septic shock: Secondary | ICD-10-CM | POA: Diagnosis not present

## 2022-10-31 DIAGNOSIS — I6930 Unspecified sequelae of cerebral infarction: Secondary | ICD-10-CM | POA: Diagnosis not present

## 2022-10-31 DIAGNOSIS — N184 Chronic kidney disease, stage 4 (severe): Secondary | ICD-10-CM | POA: Diagnosis not present

## 2022-10-31 DIAGNOSIS — L03119 Cellulitis of unspecified part of limb: Secondary | ICD-10-CM | POA: Diagnosis not present

## 2022-10-31 DIAGNOSIS — I10 Essential (primary) hypertension: Secondary | ICD-10-CM | POA: Diagnosis not present

## 2022-10-31 DIAGNOSIS — L8991 Pressure ulcer of unspecified site, stage 1: Secondary | ICD-10-CM | POA: Diagnosis not present

## 2022-10-31 DIAGNOSIS — E1129 Type 2 diabetes mellitus with other diabetic kidney complication: Secondary | ICD-10-CM | POA: Diagnosis not present

## 2022-11-05 DIAGNOSIS — L03116 Cellulitis of left lower limb: Secondary | ICD-10-CM | POA: Diagnosis not present

## 2022-11-05 DIAGNOSIS — I129 Hypertensive chronic kidney disease with stage 1 through stage 4 chronic kidney disease, or unspecified chronic kidney disease: Secondary | ICD-10-CM | POA: Diagnosis not present

## 2022-11-05 DIAGNOSIS — A419 Sepsis, unspecified organism: Secondary | ICD-10-CM | POA: Diagnosis not present

## 2022-11-05 DIAGNOSIS — R6520 Severe sepsis without septic shock: Secondary | ICD-10-CM | POA: Diagnosis not present

## 2022-11-06 DIAGNOSIS — R6520 Severe sepsis without septic shock: Secondary | ICD-10-CM | POA: Diagnosis not present

## 2022-11-06 DIAGNOSIS — I129 Hypertensive chronic kidney disease with stage 1 through stage 4 chronic kidney disease, or unspecified chronic kidney disease: Secondary | ICD-10-CM | POA: Diagnosis not present

## 2022-11-06 DIAGNOSIS — A419 Sepsis, unspecified organism: Secondary | ICD-10-CM | POA: Diagnosis not present

## 2022-11-06 DIAGNOSIS — L03116 Cellulitis of left lower limb: Secondary | ICD-10-CM | POA: Diagnosis not present

## 2022-11-06 DIAGNOSIS — N183 Chronic kidney disease, stage 3 unspecified: Secondary | ICD-10-CM | POA: Diagnosis not present

## 2022-11-10 DIAGNOSIS — N184 Chronic kidney disease, stage 4 (severe): Secondary | ICD-10-CM | POA: Diagnosis not present

## 2022-11-10 DIAGNOSIS — I63531 Cerebral infarction due to unspecified occlusion or stenosis of right posterior cerebral artery: Secondary | ICD-10-CM | POA: Diagnosis not present

## 2022-11-10 DIAGNOSIS — I69354 Hemiplegia and hemiparesis following cerebral infarction affecting left non-dominant side: Secondary | ICD-10-CM | POA: Diagnosis not present

## 2022-11-10 DIAGNOSIS — Z515 Encounter for palliative care: Secondary | ICD-10-CM | POA: Diagnosis not present

## 2022-11-10 DIAGNOSIS — L03116 Cellulitis of left lower limb: Secondary | ICD-10-CM | POA: Diagnosis not present

## 2022-11-11 NOTE — Progress Notes (Signed)
[facility] Summary Report / Loop Recorder 

## 2022-11-13 DIAGNOSIS — I129 Hypertensive chronic kidney disease with stage 1 through stage 4 chronic kidney disease, or unspecified chronic kidney disease: Secondary | ICD-10-CM | POA: Diagnosis not present

## 2022-11-13 DIAGNOSIS — A419 Sepsis, unspecified organism: Secondary | ICD-10-CM | POA: Diagnosis not present

## 2022-11-13 DIAGNOSIS — L03116 Cellulitis of left lower limb: Secondary | ICD-10-CM | POA: Diagnosis not present

## 2022-11-13 DIAGNOSIS — R6520 Severe sepsis without septic shock: Secondary | ICD-10-CM | POA: Diagnosis not present

## 2022-11-13 DIAGNOSIS — N183 Chronic kidney disease, stage 3 unspecified: Secondary | ICD-10-CM | POA: Diagnosis not present

## 2022-11-14 ENCOUNTER — Ambulatory Visit (HOSPITAL_COMMUNITY): Payer: Medicare Other | Attending: Internal Medicine | Admitting: Physical Therapy

## 2022-11-14 ENCOUNTER — Other Ambulatory Visit: Payer: Self-pay

## 2022-11-14 DIAGNOSIS — M79672 Pain in left foot: Secondary | ICD-10-CM | POA: Insufficient documentation

## 2022-11-14 DIAGNOSIS — L89623 Pressure ulcer of left heel, stage 3: Secondary | ICD-10-CM | POA: Insufficient documentation

## 2022-11-14 NOTE — Therapy (Signed)
OUTPATIENT PHYSICAL THERAPY NEURO EVALUATION   Patient Name: Curtis Jenkins MRN: [id number] DOB:1935/05/28, 87 y.o., male Today's Date: 11/14/2022   PCP: Elfredia Nevins, MD REFERRING PROVIDER: Elfredia Nevins, MD  END OF SESSION:  PT End of Session - 11/14/22 1629     Visit Number 1    Number of Visits 16    Date for PT Re-Evaluation 01/13/23    Authorization Type [employer] medicare-authorization put in    PT Start Time 1145    PT Stop Time 1240    PT Time Calculation (min) 55 min    Activity Tolerance Patient tolerated treatment well    Behavior During Therapy [facility] for tasks assessed/performed             Past Medical History:  Diagnosis Date   Arthritis    Atrial fibrillation (HCC) 03/2019   Chronic kidney disease    Dysrhythmia    Hyperlipidemia    Hypertension    Sleep apnea    Stroke (HCC) 03/2019   left sided weakness   Past Surgical History:  Procedure Laterality Date   BACK SURGERY     CATARACT EXTRACTION W/PHACO Left 07/08/2021   Procedure: CATARACT EXTRACTION PHACO AND INTRAOCULAR LENS PLACEMENT (IOC);  Surgeon: Fabio Pierce, MD;  Location: [facility];  Service: Ophthalmology;  Laterality: Left;  CDE: 16.23   CYSTOSCOPY N/A 09/10/2020   Procedure: CYSTOSCOPY;  Surgeon: Malen Gauze, MD;  Location: [facility];  Service: Urology;  Laterality: N/A;   CYSTOSCOPY W/ RETROGRADES Bilateral 08/11/2019   Procedure: CYSTOSCOPY WITH RETROGRADE PYELOGRAM;  Surgeon: Malen Gauze, MD;  Location: [facility];  Service: Urology;  Laterality: Bilateral;   left knee surgery     LOOP RECORDER INSERTION N/A 04/12/2019   Procedure: LOOP RECORDER INSERTION;  Surgeon: Marinus Maw, MD;  Location: [facility];  Service: Cardiovascular;  Laterality: N/A;   TRANSURETHRAL RESECTION OF BLADDER TUMOR Bilateral 08/11/2019   Procedure: TRANSURETHRAL RESECTION OF BLADDER TUMOR (TURBT);  Surgeon: Malen Gauze, MD;  Location: [facility];  Service: Urology;   Laterality: Bilateral;   TRANSURETHRAL RESECTION OF BLADDER TUMOR N/A 09/10/2020   Procedure: TRANSURETHRAL RESECTION OF BLADDER TUMOR (TURBT);  Surgeon: Malen Gauze, MD;  Location: [facility];  Service: Urology;  Laterality: N/A;   Patient Active Problem List   Diagnosis Date Noted   Septic shock (HCC) 10/17/2022   Pressure injury of skin 10/17/2022   Acute-on-chronic kidney injury (HCC) 10/17/2022   Cellulitis 10/17/2022   Lesion of bladder 11/16/2019   PAF (paroxysmal atrial fibrillation) (HCC) 05/05/2019   Effusion of left elbow 04/12/2019   Chronic left shoulder pain 04/12/2019   Rt Occipital Lobe Stroke with Lt hemiparesis 04/09/2019   Dysphagia following cerebrovascular accident 04/09/2019   Speech disturbance due to Acute CVA 04/09/2019   Tobacco abuse-Chews Tobacco 04/09/2019   Paresthesia    Hypocalcemia 04/03/2019   Prolonged QT interval 04/03/2019   HTN (hypertension) 04/03/2019   HLD (hyperlipidemia) 04/03/2019   Weakness 04/03/2019   Per chart review:  Admit date: 10/17/2022   Discharge date: 10/20/2022 Curtis Jenkins is a 87 y.o. male with medical history significant of hypertension, stage III chronic kidney disease, atrial fibrillation on anticoagulation, hyperlipidemia, history of stroke with residual left-sided weakness.  Patient presents with fever, swelling of his left leg with erythema and pain.  With him starting fevers last week, he was started on Paxlovid to cover COVID, then was given doxycycline, then clindamycin for cellulitis.  As his  symptoms were not improving, his daughter called EMS who brought him here for evaluation.  When EMS arrived, he was hypotensive.  They gave him some IV fluids with some improvement of his hypotension.  His cellulitis has markedly improved with the use of vancomycin and cefepime and his MRSA nares was negative.  No growth on blood cultures noted.  MRI of the lower extremity had significant motion artifact, however did not appear  to demonstrate any findings of osteomyelitis.  Additionally his ABI studies indicate mild PAD.  He appears to be much improved and now in stable condition for discharge on oral antibiotics with doxycycline and Keflex as prescribed.  No other acute events or concerns noted throughout the course of this admission.  ONSET DATE: 10/20/22  REFERRING DIAG: necrotic decubitus of heel  THERAPY DIAG:  Pain in left foot  Pressure ulcer of left heel, stage 3 (HCC)  Rationale for Evaluation and Treatment: Rehabilitation     Wound Therapy - 11/14/22 0001     Subjective Information is obtained via daughter.  PT was hospitalized for cellulitis of his Lt Leg.  While he was in the hospital he developed a wound on his left heel.  He has been discharged with [facility] nursing to care for his heel, however, the [facility] nurse felt that the pt needed more extensive care and recommended OP PT for debridement.    Patient and Family Stated Goals wound to heal    Date of Onset 11/10/22    Pain Scale Faces    Pain Score 6     Pain Location Heel    Pain Orientation Left    Patients Stated Pain Goal 0    Evaluation and Treatment Procedures Explained to Patient/Family Yes    Evaluation and Treatment Procedures agreed to    Wound Properties Date First Assessed: 11/14/22 Time First Assessed: 1200 Wound Type: Other (Comment) , pressure  Location: Heel Location Orientation: Left Wound Description (Comments): once black eschar has been removed this is the most lateral aspect Present on Admission: Yes   Wound Image Images linked: 3    Dressing Type Gauze (Comment)    Dressing Changed Changed    Dressing Status Dry    Dressing Change Frequency PRN    Site / Wound Assessment Brown    % Wound base Red or Granulating 0%    % Wound base Black/Eschar 100%    Peri-wound Assessment Edema;Induration;Erythema (blanchable)    Wound Length (cm) 1.5 cm    Wound Width (cm) 2.3 cm    Wound Depth (cm) --   unknown   Wound Surface Area (cm^2)  3.45 cm^2    Treatment Cleansed;Debridement (Selective)    Wound Properties Date First Assessed: 11/14/22 Time First Assessed: 1210 Wound Type: Other (Comment) , pressure  Location: Heel Location Orientation: Left Wound Description (Comments): medial wound Present on Admission: Yes   Dressing Type Gauze (Comment)    Dressing Changed Changed    Dressing Change Frequency PRN    Site / Wound Assessment Brown    % Wound base Red or Granulating 0%    % Wound base Black/Eschar 100%    Peri-wound Assessment Edema    Wound Length (cm) 2.3 cm    Wound Width (cm) 0.5 cm    Wound Depth (cm) --   unknown at this time   Wound Surface Area (cm^2) 1.15 cm^2    Drainage Amount None    Treatment Cleansed;Debridement (Selective)    Selective Debridement (non-excisional) - Location  wound bed    Selective Debridement (non-excisional) - Tools Used Forceps;Scalpel;Scissors    Selective Debridement (non-excisional) - Tissue Removed eschar and other devitalized tissues.    Wound Therapy - Clinical Statement Pt is an 87 yo male who comes to skilled therapy for wound care on his heel. The pt has recently been hospitalized for cellulitis in his leg and his daughter states that during the hospital stay he developed a wound on his heel.  Upon arrival the pt has significant edema in his leg, his heel is black prior to debridement, however, after debridement the pt has two wounds on his heal as described above.  Curtis Jenkins will benefit from skilled PT to maintain a healing environment to allow his wound to heal    Wound Therapy - Functional Problem List difficulty bathing, wearing a shoe,    Factors Delaying/Impairing Wound Healing Altered sensation;Infection - systemic/local;Immobility;Multiple medical problems;Polypharmacy;Vascular compromise    Hydrotherapy Plan Debridement;Dressing change;Patient/family education    Wound Therapy - Frequency 2X / week   x 8 weeks   Wound Therapy - Current Recommendations PT    Wound  Plan control edema, debridement and dressing change.    Dressing  medihoney, 4x4 and profore lite.    Manual Therapy decongestive techniques completed to decrease edema in foot and lower leg.               PATIENT EDUCATION: Education details: complete ankle pumps, increase protein intake  Person educated: Patient Education method: Explanation Education comprehension: verbalized understanding   HOME EXERCISE PROGRAM: Ankle pumps    GOALS: Goals reviewed with patient? No  SHORT TERM GOALS: Target date: 12/12/22  Pt wounds to be 100% granulated Baseline: Goal status: INITIAL  2.  Pt pain to be no greater than a 3 to allow pt to assist with transfers once again.  Baseline:  Goal status: INITIAL    LONG TERM GOALS: Target date: 01/09/23  Pt wounds to be healed to decrease risk of recurrent cellulitis  Baseline:  Goal status: INITIAL  2.  Pt to voice no pain to allow pt to be completing at least 50% of transferring  Baseline:  Goal status: INITIAL  ASSESSMENT:  CLINICAL IMPRESSION: Patient is an 87 y.o. male  who was seen today for physical therapy evaluation and treatment for evaluation and treatment of left heel wound.  Evaluation demonstrates increased edema, increased redness, increased pain and black eschar on pt heel.  Pt continues to be on two oral antibiotics per family.  Curtis Jenkins will benefit from skilled PT for debridement and manual techniques to create a healing environment and assist in decreasing his cellulitis .    OBJECTIVE IMPAIRMENTS: decreased mobility, difficulty walking, decreased ROM, decreased strength, increased edema, pain, and decreased skin integrity .   ACTIVITY LIMITATIONS: bathing, dressing, and mobility    PERSONAL FACTORS: Age, Fitness, and Past/current experiences are also affecting patient's functional outcome.   REHAB POTENTIAL: Good  CLINICAL DECISION MAKING: Evolving/moderate complexity  EVALUATION COMPLEXITY:  Moderate  PLAN: PT FREQUENCY: 2x/week  PT DURATION: 8 weeks  PLANNED INTERVENTIONS: 97110-Therapeutic exercises, 97535- Self Care, 62952- Manual therapy, and debridement with dressing change.   PLAN FOR NEXT SESSION: continue with debridement.  There was a wound on the posterior aspect of pt Lt ankle which therapist did not not until end of session please assess.   Virgina Organ, PT CLT [phone]  11/14/2022, 4:39 PM

## 2022-11-19 ENCOUNTER — Ambulatory Visit (HOSPITAL_COMMUNITY): Payer: Medicare Other | Admitting: Physical Therapy

## 2022-11-19 DIAGNOSIS — L89623 Pressure ulcer of left heel, stage 3: Secondary | ICD-10-CM | POA: Diagnosis not present

## 2022-11-19 DIAGNOSIS — M79672 Pain in left foot: Secondary | ICD-10-CM

## 2022-11-19 NOTE — Therapy (Signed)
OUTPATIENT PHYSICAL THERAPY WOUNDCARE   Patient Name: Curtis Jenkins MRN: [id number] DOB:01/09/1936, 87 y.o., male Today's Date: 11/19/2022   PCP: Elfredia Nevins, MD REFERRING PROVIDER: Elfredia Nevins, MD  END OF SESSION:  PT End of Session - 11/19/22 1228     Visit Number 2    Number of Visits 16    Date for PT Re-Evaluation 01/13/23    Authorization Type [employer] medicare-authorization put in    PT Start Time 1105    PT Stop Time 1135    PT Time Calculation (min) 30 min    Activity Tolerance Patient tolerated treatment well    Behavior During Therapy [facility] for tasks assessed/performed             Past Medical History:  Diagnosis Date   Arthritis    Atrial fibrillation (HCC) 03/2019   Chronic kidney disease    Dysrhythmia    Hyperlipidemia    Hypertension    Sleep apnea    Stroke (HCC) 03/2019   left sided weakness   Past Surgical History:  Procedure Laterality Date   BACK SURGERY     CATARACT EXTRACTION W/PHACO Left 07/08/2021   Procedure: CATARACT EXTRACTION PHACO AND INTRAOCULAR LENS PLACEMENT (IOC);  Surgeon: Fabio Pierce, MD;  Location: [facility];  Service: Ophthalmology;  Laterality: Left;  CDE: 16.23   CYSTOSCOPY N/A 09/10/2020   Procedure: CYSTOSCOPY;  Surgeon: Malen Gauze, MD;  Location: [facility];  Service: Urology;  Laterality: N/A;   CYSTOSCOPY W/ RETROGRADES Bilateral 08/11/2019   Procedure: CYSTOSCOPY WITH RETROGRADE PYELOGRAM;  Surgeon: Malen Gauze, MD;  Location: [facility];  Service: Urology;  Laterality: Bilateral;   left knee surgery     LOOP RECORDER INSERTION N/A 04/12/2019   Procedure: LOOP RECORDER INSERTION;  Surgeon: Marinus Maw, MD;  [facility];  Service: Cardiovascular;  Laterality: N/A;   TRANSURETHRAL RESECTION OF BLADDER TUMOR Bilateral 08/11/2019   Procedure: TRANSURETHRAL RESECTION OF BLADDER TUMOR (TURBT);  Surgeon: Malen Gauze, MD;  Location: [facility];  Service: Urology;  Laterality:  Bilateral;   TRANSURETHRAL RESECTION OF BLADDER TUMOR N/A 09/10/2020   Procedure: TRANSURETHRAL RESECTION OF BLADDER TUMOR (TURBT);  Surgeon: Malen Gauze, MD;  Location: [facility];  Service: Urology;  Laterality: N/A;   Patient Active Problem List   Diagnosis Date Noted   Septic shock (HCC) 10/17/2022   Pressure injury of skin 10/17/2022   Acute-on-chronic kidney injury (HCC) 10/17/2022   Cellulitis 10/17/2022   Lesion of bladder 11/16/2019   PAF (paroxysmal atrial fibrillation) (HCC) 05/05/2019   Effusion of left elbow 04/12/2019   Chronic left shoulder pain 04/12/2019   Rt Occipital Lobe Stroke with Lt hemiparesis 04/09/2019   Dysphagia following cerebrovascular accident 04/09/2019   Speech disturbance due to Acute CVA 04/09/2019   Tobacco abuse-Chews Tobacco 04/09/2019   Paresthesia    Hypocalcemia 04/03/2019   Prolonged QT interval 04/03/2019   HTN (hypertension) 04/03/2019   HLD (hyperlipidemia) 04/03/2019   Weakness 04/03/2019   Per chart review:  Admit date: 10/17/2022   Discharge date: 10/20/2022 Curtis Jenkins is a 87 y.o. male with medical history significant of hypertension, stage III chronic kidney disease, atrial fibrillation on anticoagulation, hyperlipidemia, history of stroke with residual left-sided weakness.  Patient presents with fever, swelling of his left leg with erythema and pain.  With him starting fevers last week, he was started on Paxlovid to cover COVID, then was given doxycycline, then clindamycin for cellulitis.  As his symptoms  were not improving, his daughter called EMS who brought him here for evaluation.  When EMS arrived, he was hypotensive.  They gave him some IV fluids with some improvement of his hypotension.  His cellulitis has markedly improved with the use of vancomycin and cefepime and his MRSA nares was negative.  No growth on blood cultures noted.  MRI of the lower extremity had significant motion artifact, however did not appear to  demonstrate any findings of osteomyelitis.  Additionally his ABI studies indicate mild PAD.  He appears to be much improved and now in stable condition for discharge on oral antibiotics with doxycycline and Keflex as prescribed.  No other acute events or concerns noted throughout the course of this admission.  ONSET DATE: 10/20/22  REFERRING DIAG: necrotic decubitus of heel  THERAPY DIAG:  Pain in left foot  Pressure ulcer of left heel, stage 3 (HCC)  Rationale for Evaluation and Treatment: Rehabilitation  Subjective from evaluation:  Information is obtained via daughter. PT was hospitalized for cellulitis of his Lt Leg. While he was in the hospital he developed a wound on his left heel. He has been discharged with [facility] nursing to care for his heel, however, the [facility] nurse felt that the pt needed more extensive care and recommended OP PT for debridement.    Wound Therapy - 11/19/22 1229     Subjective CG and daughter present for session today.  Dressing still intact.  Comes with heel bootie on foot and daughter reports she has ordered other pressure relief items to help at home.    Patient and Family Stated Goals wound to heal    Date of Onset 11/10/22    Pain Scale Faces    Pain Score 6     Pain Location Leg    Pain Orientation Left    Pain Descriptors / Indicators Sore    Pain Intervention(s) Repositioned    Evaluation and Treatment Procedures Explained to Patient/Family Yes    Evaluation and Treatment Procedures agreed to    Wound Properties Date First Assessed: 11/14/22 Time First Assessed: 1200 Wound Type: Other (Comment) , pressure  Location: Heel Location Orientation: Left Wound Description (Comments): once black eschar has been removed this is the most lateral aspect Present on Admission: Yes   Dressing Type Gauze (Comment)    Dressing Changed Changed    Dressing Status Old drainage;Clean, Dry, Intact    Dressing Change Frequency PRN    Site / Wound Assessment Brown    % Wound  base Red or Granulating 10%    % Wound base Yellow/Fibrinous Exudate 10%    % Wound base Black/Eschar 80%    Peri-wound Assessment Edema;Induration;Erythema (blanchable)    Margins Unattached edges (unapproximated)    Drainage Amount Minimal    Drainage Description Serosanguineous    Treatment Cleansed;Debridement (Selective)    Wound Properties Date First Assessed: 11/14/22 Time First Assessed: 1210 Wound Type: Other (Comment) , pressure  Location: Heel Location Orientation: Left Wound Description (Comments): medial wound Present on Admission: Yes   Dressing Type Gauze (Comment)    Dressing Changed Changed    Dressing Status Old drainage;Clean, Dry, Intact    Dressing Change Frequency PRN    Site / Wound Assessment Brown    % Wound base Red or Granulating 0%    % Wound base Yellow/Fibrinous Exudate 100%    Peri-wound Assessment Edema    Drainage Amount Scant    Drainage Description Serosanguineous    Treatment Cleansed;Debridement (Selective)  Selective Debridement (non-excisional) - Location wound bed    Selective Debridement (non-excisional) - Tools Used Forceps;Scalpel;Scissors    Selective Debridement (non-excisional) - Tissue Removed eschar and other devitalized tissues.    Wound Therapy - Clinical Statement Pt is an 87 yo male who comes to skilled therapy for wound care on his heel. The pt has recently been hospitalized for cellulitis in his leg and his daughter states that during the hospital stay he developed a wound on his heel.  Upon arrival the pt has significant edema in his leg, his heel is black prior to debridement, however, after debridement the pt has two wounds on his heal as described above.  Curtis Jenkins will benefit from skilled PT to maintain a healing environment to allow his wound to heal    Wound Therapy - Functional Problem List difficulty bathing, wearing a shoe,    Factors Delaying/Impairing Wound Healing Altered sensation;Infection -  systemic/local;Immobility;Multiple medical problems;Polypharmacy;Vascular compromise    Hydrotherapy Plan Debridement;Dressing change;Patient/family education    Wound Therapy - Frequency 2X / week   x 8 weeks   Wound Therapy - Current Recommendations PT    Wound Plan control edema, debridement and dressing change.    Dressing  medihoney, 4x4 and profore lite.    Manual Therapy decongestive techniques completed to decrease edema in foot and lower leg.               PATIENT EDUCATION: Education details: complete ankle pumps, increase protein intake  Person educated: Patient Education method: Explanation Education comprehension: verbalized understanding   HOME EXERCISE PROGRAM: Ankle pumps    GOALS: Goals reviewed with patient? No  SHORT TERM GOALS: Target date: 12/12/22  Pt wounds to be 100% granulated Baseline: Goal status: INITIAL  2.  Pt pain to be no greater than a 3 to allow pt to assist with transfers once again.  Baseline:  Goal status: INITIAL    LONG TERM GOALS: Target date: 01/09/23  Pt wounds to be healed to decrease risk of recurrent cellulitis  Baseline:  Goal status: INITIAL  2.  Pt to voice no pain to allow pt to be completing at least 50% of transferring  Baseline:  Goal status: INITIAL  ASSESSMENT:  CLINICAL IMPRESSION: Patient with dressing intact today.  CG and daughter report extreme reduction in edema in Lt LE.  Pt with hypersensitivity Lt LE, especially along tibia anteriorly.  Cleansing and application of lotion must be done gently.  Lt heel with some drainage, however only minimal. Most lateral wound with more granulation than medial which is still hardened to touch.  Continued with medihoney gel and profore lite.  Did not add any additional pressure relief to bandage today as daughter has heel boot in place.   Small area on posterior ankle only a dry scab when removed no wound was present beneath.  Pt will continue to benefit from skilled  PT for debridement and manual techniques to create a healing environment and assist in decreasing his cellulitis .   From evaluation:  Pt is an 87 yo male who comes to skilled therapy for wound care on his heel. The pt has recently been hospitalized for cellulitis in his leg and his daughter states that during the hospital stay he developed a wound on his heel.  Upon arrival the pt has significant edema in his leg, his heel is black prior to debridement, however, after debridement the pt has two wounds on his heal as described above.  Curtis Jenkins will  benefit from skilled PT to maintain a healing environment to allow his wound to heal   OBJECTIVE IMPAIRMENTS: decreased mobility, difficulty walking, decreased ROM, decreased strength, increased edema, pain, and decreased skin integrity .   ACTIVITY LIMITATIONS: bathing, dressing, and mobility    PERSONAL FACTORS: Age, Fitness, and Past/current experiences are also affecting patient's functional outcome.   REHAB POTENTIAL: Good  CLINICAL DECISION MAKING: Evolving/moderate complexity  EVALUATION COMPLEXITY: Moderate  PLAN: PT FREQUENCY: 2x/week  PT DURATION: 8 weeks  PLANNED INTERVENTIONS: 97110-Therapeutic exercises, 97535- Self Care, 91478- Manual therapy, and debridement with dressing change.   PLAN FOR NEXT SESSION: continue with debridement and appropriate dressing changes.     Lurena Nida, PTA/CLT [facility] Ph: [phone] 11/19/2022, 12:35 PM

## 2022-11-21 ENCOUNTER — Encounter (HOSPITAL_COMMUNITY): Payer: Self-pay

## 2022-11-21 ENCOUNTER — Ambulatory Visit (HOSPITAL_COMMUNITY): Payer: Medicare Other

## 2022-11-21 DIAGNOSIS — L89623 Pressure ulcer of left heel, stage 3: Secondary | ICD-10-CM | POA: Diagnosis not present

## 2022-11-21 DIAGNOSIS — M79672 Pain in left foot: Secondary | ICD-10-CM

## 2022-11-21 NOTE — Therapy (Signed)
OUTPATIENT PHYSICAL THERAPY WOUNDCARE   Patient Name: Curtis Jenkins MRN: [id number] DOB:Mar 25, 1935, 87 y.o., male Today's Date: 11/21/2022   PCP: Elfredia Nevins, MD REFERRING PROVIDER: Elfredia Nevins, MD  END OF SESSION:  PT End of Session - 11/21/22 0953     Visit Number 3    Number of Visits 16    Date for PT Re-Evaluation 01/13/23    Authorization Type [employer] medicare-authorization put in    PT Start Time 0755    PT Stop Time 0835    PT Time Calculation (min) 40 min    Activity Tolerance Patient tolerated treatment well    Behavior During Therapy [facility] for tasks assessed/performed             Past Medical History:  Diagnosis Date   Arthritis    Atrial fibrillation (HCC) 03/2019   Chronic kidney disease    Dysrhythmia    Hyperlipidemia    Hypertension    Sleep apnea    Stroke (HCC) 03/2019   left sided weakness   Past Surgical History:  Procedure Laterality Date   BACK SURGERY     CATARACT EXTRACTION W/PHACO Left 07/08/2021   Procedure: CATARACT EXTRACTION PHACO AND INTRAOCULAR LENS PLACEMENT (IOC);  Surgeon: Fabio Pierce, MD;  Location: [facility];  Service: Ophthalmology;  Laterality: Left;  CDE: 16.23   CYSTOSCOPY N/A 09/10/2020   Procedure: CYSTOSCOPY;  Surgeon: Malen Gauze, MD;  Location: [facility];  Service: Urology;  Laterality: N/A;   CYSTOSCOPY W/ RETROGRADES Bilateral 08/11/2019   Procedure: CYSTOSCOPY WITH RETROGRADE PYELOGRAM;  Surgeon: Malen Gauze, MD;  Location: [facility];  Service: Urology;  Laterality: Bilateral;   left knee surgery     LOOP RECORDER INSERTION N/A 04/12/2019   Procedure: LOOP RECORDER INSERTION;  Surgeon: Marinus Maw, MD;  Location: [facility];  Service: Cardiovascular;  Laterality: N/A;   TRANSURETHRAL RESECTION OF BLADDER TUMOR Bilateral 08/11/2019   Procedure: TRANSURETHRAL RESECTION OF BLADDER TUMOR (TURBT);  Surgeon: Malen Gauze, MD;  Location: [facility];  Service: Urology;  Laterality:  Bilateral;   TRANSURETHRAL RESECTION OF BLADDER TUMOR N/A 09/10/2020   Procedure: TRANSURETHRAL RESECTION OF BLADDER TUMOR (TURBT);  Surgeon: Malen Gauze, MD;  Location: [facility];  Service: Urology;  Laterality: N/A;   Patient Active Problem List   Diagnosis Date Noted   Septic shock (HCC) 10/17/2022   Pressure injury of skin 10/17/2022   Acute-on-chronic kidney injury (HCC) 10/17/2022   Cellulitis 10/17/2022   Lesion of bladder 11/16/2019   PAF (paroxysmal atrial fibrillation) (HCC) 05/05/2019   Effusion of left elbow 04/12/2019   Chronic left shoulder pain 04/12/2019   Rt Occipital Lobe Stroke with Lt hemiparesis 04/09/2019   Dysphagia following cerebrovascular accident 04/09/2019   Speech disturbance due to Acute CVA 04/09/2019   Tobacco abuse-Chews Tobacco 04/09/2019   Paresthesia    Hypocalcemia 04/03/2019   Prolonged QT interval 04/03/2019   HTN (hypertension) 04/03/2019   HLD (hyperlipidemia) 04/03/2019   Weakness 04/03/2019   Per chart review:  Admit date: 10/17/2022   Discharge date: 10/20/2022 Curtis Jenkins is a 87 y.o. male with medical history significant of hypertension, stage III chronic kidney disease, atrial fibrillation on anticoagulation, hyperlipidemia, history of stroke with residual left-sided weakness.  Patient presents with fever, swelling of his left leg with erythema and pain.  With him starting fevers last week, he was started on Paxlovid to cover COVID, then was given doxycycline, then clindamycin for cellulitis.  As his symptoms  were not improving, his daughter called EMS who brought him here for evaluation.  When EMS arrived, he was hypotensive.  They gave him some IV fluids with some improvement of his hypotension.  His cellulitis has markedly improved with the use of vancomycin and cefepime and his MRSA nares was negative.  No growth on blood cultures noted.  MRI of the lower extremity had significant motion artifact, however did not appear to  demonstrate any findings of osteomyelitis.  Additionally his ABI studies indicate mild PAD.  He appears to be much improved and now in stable condition for discharge on oral antibiotics with doxycycline and Keflex as prescribed.  No other acute events or concerns noted throughout the course of this admission.  ONSET DATE: 10/20/22  REFERRING DIAG: necrotic decubitus of heel  THERAPY DIAG:  Pain in left foot  Pressure ulcer of left heel, stage 3 (HCC)  Rationale for Evaluation and Treatment: Rehabilitation  Subjective from evaluation:  Information is obtained via daughter. PT was hospitalized for cellulitis of his Lt Leg. While he was in the hospital he developed a wound on his left heel. He has been discharged with [facility] nursing to care for his heel, however, the [facility] nurse felt that the pt needed more extensive care and recommended OP PT for debridement.    Wound Therapy - 11/21/22 0001     Subjective Daughter, granddaughter and greatgrandson present wiht pt to assist with mobility today.  Reports he continues to be tender on shin.  Tolerated well with compression though feels a little tight on foot.    Patient and Family Stated Goals wound to heal    Date of Onset 11/10/22    Pain Scale Faces    Pain Score 4     Pain Location Leg    Pain Orientation Left    Pain Descriptors / Indicators Sore    Pain Intervention(s) Repositioned;Emotional support    Evaluation and Treatment Procedures Explained to Patient/Family Yes    Evaluation and Treatment Procedures agreed to    Wound Properties Date First Assessed: 11/14/22 Time First Assessed: 1200 Wound Type: Other (Comment) , pressure  Location: Heel Location Orientation: Left Wound Description (Comments): once black eschar has been removed this is the most lateral aspect Present on Admission: Yes   Wound Image Images linked: 1    Dressing Type Gauze (Comment)   medihoney, gauze, profore lite   Dressing Changed Changed    Dressing Status Old  drainage;Clean, Dry, Intact    Dressing Change Frequency PRN    Site / Wound Assessment Brown    % Wound base Red or Granulating 15%    % Wound base Yellow/Fibrinous Exudate 10%    % Wound base Black/Eschar 75%    Peri-wound Assessment Edema;Induration;Erythema (blanchable)    Drainage Amount Minimal    Drainage Description Serosanguineous    Treatment Cleansed;Debridement (Selective)    Wound Properties Date First Assessed: 11/14/22 Time First Assessed: 1210 Wound Type: Other (Comment) , pressure  Location: Heel Location Orientation: Left Wound Description (Comments): medial wound Present on Admission: Yes   Dressing Type Gauze (Comment)   medihoney, gauze, profore lite   Dressing Changed Changed    Dressing Status Old drainage;Clean, Dry, Intact    Dressing Change Frequency PRN    Site / Wound Assessment Brown    % Wound base Red or Granulating 0%    % Wound base Yellow/Fibrinous Exudate 100%    Peri-wound Assessment Edema    Drainage Amount Scant  Drainage Description Serosanguineous    Treatment Debridement (Selective);Cleansed    Selective Debridement (non-excisional) - Location wound bed    Selective Debridement (non-excisional) - Tools Used Forceps;Scalpel;Scissors    Selective Debridement (non-excisional) - Tissue Removed eschar and other devitalized tissues.    Wound Therapy - Clinical Statement Pt is an 87 yo male who comes to skilled therapy for wound care on his heel. The pt has recently been hospitalized for cellulitis in his leg and his daughter states that during the hospital stay he developed a wound on his heel.  Upon arrival the pt has significant edema in his leg, his heel is black prior to debridement, however, after debridement the pt has two wounds on his heal as described above.  Curtis Jenkins will benefit from skilled PT to maintain a healing environment to allow his wound to heal    Wound Therapy - Functional Problem List difficulty bathing, wearing a shoe,     Factors Delaying/Impairing Wound Healing Altered sensation;Infection - systemic/local;Immobility;Multiple medical problems;Polypharmacy;Vascular compromise    Hydrotherapy Plan Debridement;Dressing change;Patient/family education    Wound Therapy - Frequency 2X / week    Wound Therapy - Current Recommendations PT    Wound Plan control edema, debridement and dressing change.    Dressing  medihoney, 4x4 and profore lite.    Manual Therapy decongestive techniques completed to decrease edema in foot and lower leg.               PATIENT EDUCATION: Education details: complete ankle pumps, increase protein intake  Person educated: Patient Education method: Explanation Education comprehension: verbalized understanding   HOME EXERCISE PROGRAM: Ankle pumps    GOALS: Goals reviewed with patient? No  SHORT TERM GOALS: Target date: 12/12/22  Pt wounds to be 100% granulated Baseline: Goal status: INITIAL  2.  Pt pain to be no greater than a 3 to allow pt to assist with transfers once again.  Baseline:  Goal status: INITIAL    LONG TERM GOALS: Target date: 01/09/23  Pt wounds to be healed to decrease risk of recurrent cellulitis  Baseline:  Goal status: INITIAL  2.  Pt to voice no pain to allow pt to be completing at least 50% of transferring  Baseline:  Goal status: INITIAL  ASSESSMENT:  CLINICAL IMPRESSION: Selective debridement to address adherent slough from wound bed and dry skin perimeter to promote healing.  Pt very sensitive on Lt skin, gentle cleansing and lotion.  Continues with medihoney gel and profore lite to address adherent slough and edema control.  Pt wearing heel boot with reports of comfort at EOS.  From evaluation:  Pt is an 87 yo male who comes to skilled therapy for wound care on his heel. The pt has recently been hospitalized for cellulitis in his leg and his daughter states that during the hospital stay he developed a wound on his heel.  Upon arrival  the pt has significant edema in his leg, his heel is black prior to debridement, however, after debridement the pt has two wounds on his heal as described above.  Curtis Jenkins will benefit from skilled PT to maintain a healing environment to allow his wound to heal   OBJECTIVE IMPAIRMENTS: decreased mobility, difficulty walking, decreased ROM, decreased strength, increased edema, pain, and decreased skin integrity .   ACTIVITY LIMITATIONS: bathing, dressing, and mobility    PERSONAL FACTORS: Age, Fitness, and Past/current experiences are also affecting patient's functional outcome.   REHAB POTENTIAL: Good  CLINICAL DECISION MAKING: Evolving/moderate complexity  EVALUATION COMPLEXITY: Moderate  PLAN: PT FREQUENCY: 2x/week  PT DURATION: 8 weeks  PLANNED INTERVENTIONS: 97110-Therapeutic exercises, 97535- Self Care, 16109- Manual therapy, and debridement with dressing change.   PLAN FOR NEXT SESSION: continue with debridement and appropriate dressing changes.     Becky Sax, LPTA/CLT; CBIS [phone]  Juel Burrow, PTA 11/21/2022, 9:53 AM  11/21/2022, 9:53 AM

## 2022-11-25 ENCOUNTER — Ambulatory Visit (HOSPITAL_COMMUNITY): Payer: Medicare Other

## 2022-11-25 ENCOUNTER — Ambulatory Visit (HOSPITAL_COMMUNITY): Payer: Medicare Other | Admitting: Physical Therapy

## 2022-11-25 DIAGNOSIS — M79672 Pain in left foot: Secondary | ICD-10-CM

## 2022-11-25 DIAGNOSIS — L89623 Pressure ulcer of left heel, stage 3: Secondary | ICD-10-CM

## 2022-11-25 NOTE — Therapy (Signed)
OUTPATIENT PHYSICAL THERAPY WOUNDCARE   Patient Name: Curtis Jenkins MRN: [id number] DOB:1935/03/20, 87 y.o., male Today's Date: 11/25/2022   PCP: Curtis Nevins, MD REFERRING PROVIDER: Elfredia Nevins, MD  END OF SESSION:  PT End of Session - 11/25/22 1526     Visit Number 4    Number of Visits 16    Date for PT Re-Evaluation 01/13/23    Authorization Type [employer] medicare-authorization put in    PT Start Time 0805    PT Stop Time 0845    PT Time Calculation (min) 40 min    Activity Tolerance Patient tolerated treatment well    Behavior During Therapy [facility] for tasks assessed/performed              Past Medical History:  Diagnosis Date   Arthritis    Atrial fibrillation (HCC) 03/2019   Chronic kidney disease    Dysrhythmia    Hyperlipidemia    Hypertension    Sleep apnea    Stroke (HCC) 03/2019   left sided weakness   Past Surgical History:  Procedure Laterality Date   BACK SURGERY     CATARACT EXTRACTION W/PHACO Left 07/08/2021   Procedure: CATARACT EXTRACTION PHACO AND INTRAOCULAR LENS PLACEMENT (IOC);  Surgeon: Curtis Pierce, MD;  Location: [facility];  Service: Ophthalmology;  Laterality: Left;  CDE: 16.23   CYSTOSCOPY N/A 09/10/2020   Procedure: CYSTOSCOPY;  Surgeon: Curtis Gauze, MD;  Location: [facility];  Service: Urology;  Laterality: N/A;   CYSTOSCOPY W/ RETROGRADES Bilateral 08/11/2019   Procedure: CYSTOSCOPY WITH RETROGRADE PYELOGRAM;  Surgeon: Curtis Gauze, MD;  Location: [facility];  Service: Urology;  Laterality: Bilateral;   left knee surgery     LOOP RECORDER INSERTION N/A 04/12/2019   Procedure: LOOP RECORDER INSERTION;  Surgeon: Curtis Maw, MD;  Location: [facility];  Service: Cardiovascular;  Laterality: N/A;   TRANSURETHRAL RESECTION OF BLADDER TUMOR Bilateral 08/11/2019   Procedure: TRANSURETHRAL RESECTION OF BLADDER TUMOR (TURBT);  Surgeon: Curtis Gauze, MD;  Location: [facility];  Service: Urology;  Laterality:  Bilateral;   TRANSURETHRAL RESECTION OF BLADDER TUMOR N/A 09/10/2020   Procedure: TRANSURETHRAL RESECTION OF BLADDER TUMOR (TURBT);  Surgeon: Curtis Gauze, MD;  Location: [facility];  Service: Urology;  Laterality: N/A;   Patient Active Problem List   Diagnosis Date Noted   Septic shock (HCC) 10/17/2022   Pressure injury of skin 10/17/2022   Acute-on-chronic kidney injury (HCC) 10/17/2022   Cellulitis 10/17/2022   Lesion of bladder 11/16/2019   PAF (paroxysmal atrial fibrillation) (HCC) 05/05/2019   Effusion of left elbow 04/12/2019   Chronic left shoulder pain 04/12/2019   Rt Occipital Lobe Stroke with Lt hemiparesis 04/09/2019   Dysphagia following cerebrovascular accident 04/09/2019   Speech disturbance due to Acute CVA 04/09/2019   Tobacco abuse-Chews Tobacco 04/09/2019   Paresthesia    Hypocalcemia 04/03/2019   Prolonged QT interval 04/03/2019   HTN (hypertension) 04/03/2019   HLD (hyperlipidemia) 04/03/2019   Weakness 04/03/2019   Per chart review:  Admit date: 10/17/2022   Discharge date: 10/20/2022 Curtis Jenkins is a 87 y.o. male with medical history significant of hypertension, stage III chronic kidney disease, atrial fibrillation on anticoagulation, hyperlipidemia, history of stroke with residual left-sided weakness.  Patient presents with fever, swelling of his left leg with erythema and pain.  With him starting fevers last week, he was started on Paxlovid to cover COVID, then was given doxycycline, then clindamycin for cellulitis.  As his  symptoms were not improving, his daughter called EMS who brought him here for evaluation.  When EMS arrived, he was hypotensive.  They gave him some IV fluids with some improvement of his hypotension.  His cellulitis has markedly improved with the use of vancomycin and cefepime and his MRSA nares was negative.  No growth on blood cultures noted.  MRI of the lower extremity had significant motion artifact, however did not appear to  demonstrate any findings of osteomyelitis.  Additionally his ABI studies indicate mild PAD.  He appears to be much improved and now in stable condition for discharge on oral antibiotics with doxycycline and Keflex as prescribed.  No other acute events or concerns noted throughout the course of this admission.  ONSET DATE: 10/20/22  REFERRING DIAG: necrotic decubitus of heel  THERAPY DIAG:  Pain in left foot  Pressure ulcer of left heel, stage 3 (HCC)  Rationale for Evaluation and Treatment: Rehabilitation  Subjective from evaluation:  Information is obtained via daughter. PT was hospitalized for cellulitis of his Lt Leg. While he was in the hospital he developed a wound on his left heel. He has been discharged with [facility] nursing to care for his heel, however, the [facility] nurse felt that the pt needed more extensive care and recommended OP PT for debridement.    Wound Therapy - 11/25/22 1516     Subjective daughter and friend present today for treatment.  State he's been doing well at home.  No pain or issuess just sensitive to touch.    Patient and Family Stated Goals wound to heal    Date of Onset 11/10/22    Pain Scale Faces    Pain Score 4     Pain Location Leg    Pain Orientation Left    Pain Descriptors / Indicators Sore    Pain Intervention(s) Repositioned    Evaluation and Treatment Procedures Explained to Patient/Family Yes    Evaluation and Treatment Procedures agreed to    Wound Properties Date First Assessed: 11/14/22 Time First Assessed: 1200 Wound Type: Other (Comment) , pressure  Location: Heel Location Orientation: Left Wound Description (Comments): once black eschar has been removed this is the most lateral aspect Present on Admission: Yes   Dressing Type Jenkins (Comment)   medihoney, Jenkins, profore lite   Dressing Changed Changed    Dressing Status Old drainage;Clean, Dry, Intact    Dressing Change Frequency PRN    Site / Wound Assessment Brown    % Wound base Red or  Granulating 40%    % Wound base Yellow/Fibrinous Exudate 60%    % Wound base Black/Eschar --   removed all black eschar today   Peri-wound Assessment Edema;Induration;Erythema (blanchable)    Drainage Amount Minimal    Drainage Description Serosanguineous    Treatment Cleansed;Debridement (Selective)    Wound Properties Date First Assessed: 11/14/22 Time First Assessed: 1210 Wound Type: Other (Comment) , pressure  Location: Heel Location Orientation: Left Wound Description (Comments): medial wound Present on Admission: Yes   Dressing Type Jenkins (Comment)   medihoney, Jenkins, profore lite   Dressing Status Old drainage;Clean, Dry, Intact    Dressing Change Frequency PRN    Site / Wound Assessment Brown    % Wound base Red or Granulating 10%    % Wound base Yellow/Fibrinous Exudate 10%    % Wound base Black/Eschar 80%    Peri-wound Assessment Edema    Drainage Amount Minimal    Drainage Description Serosanguineous    Treatment  Cleansed;Debridement (Selective)    Selective Debridement (non-excisional) - Location wound bed    Selective Debridement (non-excisional) - Tools Used Forceps;Scalpel;Scissors    Selective Debridement (non-excisional) - Tissue Removed eschar and other devitalized tissues.    Wound Therapy - Clinical Statement Pt is an 86 yo male who comes to skilled therapy for wound care on his heel. The pt has recently been hospitalized for cellulitis in his leg and his daughter states that during the hospital stay he developed a wound on his heel.  Upon arrival the pt has significant edema in his leg, his heel is black prior to debridement, however, after debridement the pt has two wounds on his heal as described above.  Mr. Sapio will benefit from skilled PT to maintain a healing environmentSee below for daily progress. Evaluation:  to allow his wound to heal    Wound Therapy - Functional Problem List difficulty bathing, wearing a shoe,    Factors Delaying/Impairing Wound Healing  Altered sensation;Infection - systemic/local;Immobility;Multiple medical problems;Polypharmacy;Vascular compromise    Hydrotherapy Plan Debridement;Dressing change;Patient/family education    Wound Therapy - Frequency 2X / week    Wound Therapy - Current Recommendations PT    Wound Plan control edema, debridement and dressing change.    Dressing  medihoney, 4x4 and profore lite.               PATIENT EDUCATION: Education details: complete ankle pumps, increase protein intake  Person educated: Patient Education method: Explanation Education comprehension: verbalized understanding   HOME EXERCISE PROGRAM: Ankle pumps    GOALS: Goals reviewed with patient? No  SHORT TERM GOALS: Target date: 12/12/22  Pt wounds to be 100% granulated Baseline: Goal status: INITIAL  2.  Pt pain to be no greater than a 3 to allow pt to assist with transfers once again.  Baseline:  Goal status: INITIAL    LONG TERM GOALS: Target date: 01/09/23  Pt wounds to be healed to decrease risk of recurrent cellulitis  Baseline:  Goal status: INITIAL  2.  Pt to voice no pain to allow pt to be completing at least 50% of transferring  Baseline:  Goal status: INITIAL  ASSESSMENT:  CLINICAL IMPRESSION: Both wounds covered 100% with black eschar when removed dressing.  Able to debride all from smaller area and approx 25% from larger one.  Pt continues to be very sensitive to touch, proceeding gently.  Minimal edema into toes but not into LE today. Cleansed and applied lotion.  Continued with medihoney gel and profore lite to address adherent slough and edema control.  Pt wearing heel boot with reports of comfort at EOS.  From evaluation:  Pt is an 87 yo male who comes to skilled therapy for wound care on his heel. The pt has recently been hospitalized for cellulitis in his leg and his daughter states that during the hospital stay he developed a wound on his heel.  Upon arrival the pt has significant  edema in his leg, his heel is black prior to debridement, however, after debridement the pt has two wounds on his heal as described above.  Mr. Gantz will benefit from skilled PT to maintain a healing environment to allow his wound to heal   OBJECTIVE IMPAIRMENTS: decreased mobility, difficulty walking, decreased ROM, decreased strength, increased edema, pain, and decreased skin integrity .   ACTIVITY LIMITATIONS: bathing, dressing, and mobility    PERSONAL FACTORS: Age, Fitness, and Past/current experiences are also affecting patient's functional outcome.   REHAB POTENTIAL: Good  CLINICAL DECISION MAKING: Evolving/moderate complexity  EVALUATION COMPLEXITY: Moderate  PLAN: PT FREQUENCY: 2x/week  PT DURATION: 8 weeks  PLANNED INTERVENTIONS: 97110-Therapeutic exercises, 97535- Self Care, 09811- Manual therapy, and debridement with dressing change.   PLAN FOR NEXT SESSION: continue with debridement and appropriate dressing changes. Take pictures and measure next visit.    Bascom Levels, Reginal Wojcicki B, PTA 11/25/2022, 3:26 PM  11/25/2022, 3:26 PM

## 2022-11-28 ENCOUNTER — Ambulatory Visit (HOSPITAL_COMMUNITY): Payer: Medicare Other | Admitting: Physical Therapy

## 2022-11-28 DIAGNOSIS — L89623 Pressure ulcer of left heel, stage 3: Secondary | ICD-10-CM

## 2022-11-28 DIAGNOSIS — M79672 Pain in left foot: Secondary | ICD-10-CM | POA: Diagnosis not present

## 2022-11-28 NOTE — Therapy (Signed)
OUTPATIENT PHYSICAL THERAPY WOUNDCARE   Patient Name: Curtis Jenkins MRN: [id number] DOB:1935/11/12, 87 y.o., male Today's Date: 11/28/2022   PCP: Elfredia Nevins, MD REFERRING PROVIDER: Elfredia Nevins, MD  END OF SESSION:  PT End of Session - 11/28/22 1006     Visit Number 5    Number of Visits 16    Date for PT Re-Evaluation 01/13/23    Authorization Type approved 16 visits from 11/1 - 12/27    Authorization - Visit Number 5    Authorization - Number of Visits 16    Progress Note Due on Visit 10    PT Start Time 0855    PT Stop Time 0930    PT Time Calculation (min) 35 min    Activity Tolerance Patient tolerated treatment well    Behavior During Therapy [facility] for tasks assessed/performed              Past Medical History:  Diagnosis Date   Arthritis    Atrial fibrillation (HCC) 03/2019   Chronic kidney disease    Dysrhythmia    Hyperlipidemia    Hypertension    Sleep apnea    Stroke (HCC) 03/2019   left sided weakness   Past Surgical History:  Procedure Laterality Date   BACK SURGERY     CATARACT EXTRACTION W/PHACO Left 07/08/2021   Procedure: CATARACT EXTRACTION PHACO AND INTRAOCULAR LENS PLACEMENT (IOC);  Surgeon: Fabio Pierce, MD;  Location: [facility];  Service: Ophthalmology;  Laterality: Left;  CDE: 16.23   CYSTOSCOPY N/A 09/10/2020   Procedure: CYSTOSCOPY;  Surgeon: Malen Gauze, MD;  Location: [facility];  Service: Urology;  Laterality: N/A;   CYSTOSCOPY W/ RETROGRADES Bilateral 08/11/2019   Procedure: CYSTOSCOPY WITH RETROGRADE PYELOGRAM;  Surgeon: Malen Gauze, MD;  Location: [facility];  Service: Urology;  Laterality: Bilateral;   left knee surgery     LOOP RECORDER INSERTION N/A 04/12/2019   Procedure: LOOP RECORDER INSERTION;  Surgeon: Marinus Maw, MD;  Location: [facility];  Service: Cardiovascular;  Laterality: N/A;   TRANSURETHRAL RESECTION OF BLADDER TUMOR Bilateral 08/11/2019   Procedure: TRANSURETHRAL RESECTION OF BLADDER  TUMOR (TURBT);  Surgeon: Malen Gauze, MD;  Location: [facility];  Service: Urology;  Laterality: Bilateral;   TRANSURETHRAL RESECTION OF BLADDER TUMOR N/A 09/10/2020   Procedure: TRANSURETHRAL RESECTION OF BLADDER TUMOR (TURBT);  Surgeon: Malen Gauze, MD;  Location: [facility];  Service: Urology;  Laterality: N/A;   Patient Active Problem List   Diagnosis Date Noted   Septic shock (HCC) 10/17/2022   Pressure injury of skin 10/17/2022   Acute-on-chronic kidney injury (HCC) 10/17/2022   Cellulitis 10/17/2022   Lesion of bladder 11/16/2019   PAF (paroxysmal atrial fibrillation) (HCC) 05/05/2019   Effusion of left elbow 04/12/2019   Chronic left shoulder pain 04/12/2019   Rt Occipital Lobe Stroke with Lt hemiparesis 04/09/2019   Dysphagia following cerebrovascular accident 04/09/2019   Speech disturbance due to Acute CVA 04/09/2019   Tobacco abuse-Chews Tobacco 04/09/2019   Paresthesia    Hypocalcemia 04/03/2019   Prolonged QT interval 04/03/2019   HTN (hypertension) 04/03/2019   HLD (hyperlipidemia) 04/03/2019   Weakness 04/03/2019   Per chart review:  Admit date: 10/17/2022   Discharge date: 10/20/2022 Curtis Jenkins is a 87 y.o. male with medical history significant of hypertension, stage III chronic kidney disease, atrial fibrillation on anticoagulation, hyperlipidemia, history of stroke with residual left-sided weakness.  Patient presents with fever, swelling of his left leg with erythema  and pain.  With him starting fevers last week, he was started on Paxlovid to cover COVID, then was given doxycycline, then clindamycin for cellulitis.  As his symptoms were not improving, his daughter called EMS who brought him here for evaluation.  When EMS arrived, he was hypotensive.  They gave him some IV fluids with some improvement of his hypotension.  His cellulitis has markedly improved with the use of vancomycin and cefepime and his MRSA nares was negative.  No growth on blood cultures  noted.  MRI of the lower extremity had significant motion artifact, however did not appear to demonstrate any findings of osteomyelitis.  Additionally his ABI studies indicate mild PAD.  He appears to be much improved and now in stable condition for discharge on oral antibiotics with doxycycline and Keflex as prescribed.  No other acute events or concerns noted throughout the course of this admission.  ONSET DATE: 10/20/22  REFERRING DIAG: necrotic decubitus of heel  THERAPY DIAG:  Pain in left foot  Pressure ulcer of left heel, stage 3 (HCC)  Rationale for Evaluation and Treatment: Rehabilitation  Subjective from evaluation:  Information is obtained via daughter. PT was hospitalized for cellulitis of his Lt Leg. While he was in the hospital he developed a wound on his left heel. He has been discharged with [facility] nursing to care for his heel, however, the [facility] nurse felt that the pt needed more extensive care and recommended OP PT for debridement.    Wound Therapy - 11/28/22 0001     Subjective   No pain or issuess just sensitive to touch.    Patient and Family Stated Goals wound to heal    Date of Onset 11/10/22    Pain Scale Faces    Pain Score 4     Pain Location Heel    Pain Orientation Left    Pain Descriptors / Indicators Sore    Evaluation and Treatment Procedures Explained to Patient/Family Yes    Evaluation and Treatment Procedures agreed to    Wound Properties Date First Assessed: 11/14/22 Time First Assessed: 1200 Wound Type: Other (Comment) , pressure  Location: Heel Location Orientation: Left Wound Description (Comments): once black eschar has been removed this is the most lateral aspect Present on Admission: Yes   Dressing Type Gauze (Comment)   medihoney, gauze, profore lite   Dressing Changed Changed    Dressing Status Old drainage;Clean, Dry, Intact    Dressing Change Frequency PRN    Site / Wound Assessment Brown    % Wound base Red or Granulating 30%    % Wound base  Yellow/Fibrinous Exudate 70%    Peri-wound Assessment Edema;Induration;Erythema (blanchable)    Drainage Amount Scant    Drainage Description Serous    Treatment Cleansed;Debridement (Selective)    Wound Properties Date First Assessed: 11/14/22 Time First Assessed: 1210 Wound Type: Other (Comment) , pressure  Location: Heel Location Orientation: Left Wound Description (Comments): medial wound Present on Admission: Yes   Dressing Type Gauze (Comment)   medihoney, gauze, profore lite   Dressing Status Old drainage;Clean, Dry, Intact    Dressing Change Frequency PRN    Site / Wound Assessment Brown    % Wound base Red or Granulating 15%    % Wound base Yellow/Fibrinous Exudate 75%    Peri-wound Assessment Edema    Drainage Amount Scant    Drainage Description Serous    Treatment Cleansed;Debridement (Selective)    Selective Debridement (non-excisional) - Location wound bed  Selective Debridement (non-excisional) - Tools Used Forceps;Scalpel;Scissors    Selective Debridement (non-excisional) - Tissue Removed eschar and other devitalized tissues.    Wound Therapy - Clinical Statement Pt is an 87 yo male who comes to skilled therapy for wound care on his heel. The pt has recently been hospitalized for cellulitis in his leg and his daughter states that during the hospital stay he developed a wound on his heel.  Upon arrival the pt has significant edema in his leg, his heel is black prior to debridement, however, after debridement the pt has two wounds on his heal as described above.  Mr. Seavers will benefit from skilled PT to maintain a healing environmentSee below for daily progress. Evaluation:  to allow his wound to heal    Wound Therapy - Functional Problem List difficulty bathing, wearing a shoe,    Factors Delaying/Impairing Wound Healing Altered sensation;Infection - systemic/local;Immobility;Multiple medical problems;Polypharmacy;Vascular compromise    Hydrotherapy Plan Debridement;Dressing  change;Patient/family education    Wound Therapy - Frequency 2X / week    Wound Therapy - Current Recommendations PT    Wound Plan control edema, debridement and dressing change.    Dressing  medihoney, 4x4 and profore lite.    Manual Therapy decongestive techniques completed to decrease edema in foot and lower leg.               PATIENT EDUCATION: Education details: complete ankle pumps, increase protein intake  Person educated: Patient Education method: Explanation Education comprehension: verbalized understanding   HOME EXERCISE PROGRAM: Ankle pumps    GOALS: Goals reviewed with patient? No  SHORT TERM GOALS: Target date: 12/12/22  Pt wounds to be 100% granulated Baseline: Goal status: IN PROGRESS  2.  Pt pain to be no greater than a 3 to allow pt to assist with transfers once again.  Baseline:  Goal status: IN PROGRESS    LONG TERM GOALS: Target date: 01/09/23  Pt wounds to be healed to decrease risk of recurrent cellulitis  Baseline:  Goal status: IN PROGRESS  2.  Pt to voice no pain to allow pt to be completing at least 50% of transferring  Baseline:  Goal status: IN PROGRESS  ASSESSMENT:  CLINICAL IMPRESSION: Pt foot continues to have edema LE is improving, Wounds with adherent eschar.  Therapist cut pressure relief for Lt heel with 1/2" foam.    Cleansed and applied lotion.  Continued with medihoney gel and profore to address adherent slough and edema control with therapist relaxing pressure at heel area.   Pt wearing heel boot with reports of comfort at EOS.  From evaluation:  Pt is an 87 yo male who comes to skilled therapy for wound care on his heel. The pt has recently been hospitalized for cellulitis in his leg and his daughter states that during the hospital stay he developed a wound on his heel.  Upon arrival the pt has significant edema in his leg, his heel is black prior to debridement, however, after debridement the pt has two wounds on his  heal as described above.  Mr. Orecchio will benefit from skilled PT to maintain a healing environment to allow his wound to heal   OBJECTIVE IMPAIRMENTS: decreased mobility, difficulty walking, decreased ROM, decreased strength, increased edema, pain, and decreased skin integrity .   ACTIVITY LIMITATIONS: bathing, dressing, and mobility    PERSONAL FACTORS: Age, Fitness, and Past/current experiences are also affecting patient's functional outcome.   REHAB POTENTIAL: Good  CLINICAL DECISION MAKING: Evolving/moderate complexity  EVALUATION COMPLEXITY: Moderate  PLAN: PT FREQUENCY: 2x/week  PT DURATION: 8 weeks  PLANNED INTERVENTIONS: 97110-Therapeutic exercises, 97535- Self Care, 97026- Manual therapy, and debridement with dressing change.   PLAN FOR NEXT SESSION: continue with debridement and appropriate dressing changes. Take pictures and measure next visit.   Virgina Organ, PT CLT [phone]  11/28/2022, 10:14 AM

## 2022-12-01 ENCOUNTER — Ambulatory Visit: Payer: Medicare Other | Attending: Internal Medicine

## 2022-12-01 DIAGNOSIS — I63531 Cerebral infarction due to unspecified occlusion or stenosis of right posterior cerebral artery: Secondary | ICD-10-CM

## 2022-12-01 LAB — CUP PACEART REMOTE DEVICE CHECK
Date Time Interrogation Session: 20241117230256
Implantable Pulse Generator Implant Date: 20210330

## 2022-12-02 ENCOUNTER — Ambulatory Visit (HOSPITAL_COMMUNITY): Payer: Medicare Other

## 2022-12-02 ENCOUNTER — Encounter (HOSPITAL_COMMUNITY): Payer: Self-pay

## 2022-12-02 DIAGNOSIS — Z515 Encounter for palliative care: Secondary | ICD-10-CM | POA: Diagnosis not present

## 2022-12-02 DIAGNOSIS — L89623 Pressure ulcer of left heel, stage 3: Secondary | ICD-10-CM | POA: Diagnosis not present

## 2022-12-02 DIAGNOSIS — I63531 Cerebral infarction due to unspecified occlusion or stenosis of right posterior cerebral artery: Secondary | ICD-10-CM | POA: Diagnosis not present

## 2022-12-02 DIAGNOSIS — M79672 Pain in left foot: Secondary | ICD-10-CM

## 2022-12-02 NOTE — Therapy (Signed)
OUTPATIENT PHYSICAL THERAPY WOUNDCARE   Patient Name: Curtis Jenkins MRN: [id number] DOB:[birth year], 87 y.o., male Today's Date: 12/02/2022   PCP: Elfredia Nevins, MD REFERRING PROVIDER: Elfredia Nevins, MD  END OF SESSION:  PT End of Session - 12/02/22 1053     Visit Number 6    Number of Visits 16    Date for PT Re-Evaluation 01/13/23    Authorization Type approved 16 visits from 11/1 - 12/27    Authorization - Visit Number 6    Authorization - Number of Visits 16    Progress Note Due on Visit 10    PT Start Time 0853    PT Stop Time 0935    PT Time Calculation (min) 42 min    Activity Tolerance Patient tolerated treatment well    Behavior During Therapy [facility] for tasks assessed/performed              Past Medical History:  Diagnosis Date   Arthritis    Atrial fibrillation (HCC) 03/2019   Chronic kidney disease    Dysrhythmia    Hyperlipidemia    Hypertension    Sleep apnea    Stroke (HCC) 03/2019   left sided weakness   Past Surgical History:  Procedure Laterality Date   BACK SURGERY     CATARACT EXTRACTION W/PHACO Left 07/08/2021   Procedure: CATARACT EXTRACTION PHACO AND INTRAOCULAR LENS PLACEMENT (IOC);  Surgeon: Fabio Pierce, MD;  Location: [facility];  Service: Ophthalmology;  Laterality: Left;  CDE: 16.23   CYSTOSCOPY N/A 09/10/2020   Procedure: CYSTOSCOPY;  Surgeon: Malen Gauze, MD;  Location: [facility];  Service: Urology;  Laterality: N/A;   CYSTOSCOPY W/ RETROGRADES Bilateral 08/11/2019   Procedure: CYSTOSCOPY WITH RETROGRADE PYELOGRAM;  Surgeon: Malen Gauze, MD;  Location: [facility];  Service: Urology;  Laterality: Bilateral;   left knee surgery     LOOP RECORDER INSERTION N/A 04/12/2019   Procedure: LOOP RECORDER INSERTION;  Surgeon: Marinus Maw, MD;  [facility]: [facility];  Service: Cardiovascular;  Laterality: N/A;   TRANSURETHRAL RESECTION OF BLADDER TUMOR Bilateral 08/11/2019   Procedure: TRANSURETHRAL RESECTION OF BLADDER  TUMOR (TURBT);  Surgeon: Malen Gauze, MD;  Location: [facility];  Service: Urology;  Laterality: Bilateral;   TRANSURETHRAL RESECTION OF BLADDER TUMOR N/A 09/10/2020   Procedure: TRANSURETHRAL RESECTION OF BLADDER TUMOR (TURBT);  Surgeon: Malen Gauze, MD;  Location: [facility];  Service: Urology;  Laterality: N/A;   Patient Active Problem List   Diagnosis Date Noted   Septic shock (HCC) 10/17/2022   Pressure injury of skin 10/17/2022   Acute-on-chronic kidney injury (HCC) 10/17/2022   Cellulitis 10/17/2022   Lesion of bladder 11/16/2019   PAF (paroxysmal atrial fibrillation) (HCC) 05/05/2019   Effusion of left elbow 04/12/2019   Chronic left shoulder pain 04/12/2019   Rt Occipital Lobe Stroke with Lt hemiparesis 04/09/2019   Dysphagia following cerebrovascular accident 04/09/2019   Speech disturbance due to Acute CVA 04/09/2019   Tobacco abuse-Chews Tobacco 04/09/2019   Paresthesia    Hypocalcemia 04/03/2019   Prolonged QT interval 04/03/2019   HTN (hypertension) 04/03/2019   HLD (hyperlipidemia) 04/03/2019   Weakness 04/03/2019   Per chart review:  Admit date: 10/17/2022   Discharge date: 10/20/2022 MARKI CORVIN is a 87 y.o. male with medical history significant of hypertension, stage III chronic kidney disease, atrial fibrillation on anticoagulation, hyperlipidemia, history of stroke with residual left-sided weakness.  Patient presents with fever, swelling of his left leg with erythema  and pain.  With him starting fevers last week, he was started on Paxlovid to cover COVID, then was given doxycycline, then clindamycin for cellulitis.  As his symptoms were not improving, his daughter called EMS who brought him here for evaluation.  When EMS arrived, he was hypotensive.  They gave him some IV fluids with some improvement of his hypotension.  His cellulitis has markedly improved with the use of vancomycin and cefepime and his MRSA nares was negative.  No growth on blood cultures  noted.  MRI of the lower extremity had significant motion artifact, however did not appear to demonstrate any findings of osteomyelitis.  Additionally his ABI studies indicate mild PAD.  He appears to be much improved and now in stable condition for discharge on oral antibiotics with doxycycline and Keflex as prescribed.  No other acute events or concerns noted throughout the course of this admission.  ONSET DATE: 10/20/22  REFERRING DIAG: necrotic decubitus of heel  THERAPY DIAG:  Pain in left foot  Pressure ulcer of left heel, stage 3 (HCC)  Rationale for Evaluation and Treatment: Rehabilitation  Subjective from evaluation:  Information is obtained via daughter. PT was hospitalized for cellulitis of his Lt Leg. While he was in the hospital he developed a wound on his left heel. He has been discharged with [facility] nursing to care for his heel, however, the [facility] nurse felt that the pt needed more extensive care and recommended OP PT for debridement.     Wound Therapy - 12/02/22 0001     Subjective Pt arrived with family members who assisted with transfer to mat.  Dressings intact, no reoprts of pain.    Patient and Family Stated Goals wound to heal    Date of Onset 11/10/22    Pain Scale 0-10    Pain Score 0-No pain   Sensitive shin, light touch   Evaluation and Treatment Procedures Explained to Patient/Family Yes    Evaluation and Treatment Procedures agreed to    Wound Properties Date First Assessed: 11/14/22 Time First Assessed: 1200 Wound Type: Other (Comment) , pressure  Location: Heel Location Orientation: Left Wound Description (Comments): once black eschar has been removed this is the most lateral aspect Present on Admission: Yes   Wound Image Images linked: 1    Dressing Type Gauze (Comment)   vaseline/lotion perimeter, medihoney, 2x2, kerlix, extra cotton for shape, coban with netting.   Dressing Changed Changed    Dressing Status Old drainage;Clean, Dry, Intact    Dressing Change  Frequency PRN    Site / Wound Assessment Dusky;Brown    % Wound base Red or Granulating 30%    % Wound base Yellow/Fibrinous Exudate 70%    Peri-wound Assessment Edema;Induration;Erythema (blanchable)    Wound Length (cm) 0.8 cm    Wound Width (cm) 1.4 cm    Wound Depth (cm) --   unknown   Wound Surface Area (cm^2) 1.12 cm^2    Drainage Amount Scant    Drainage Description Serous    Treatment Cleansed;Debridement (Selective)    Wound Properties Date First Assessed: 11/14/22 Time First Assessed: 1210 Wound Type: Other (Comment) , pressure  Location: Heel Location Orientation: Left Wound Description (Comments): medial wound Present on Admission: Yes   Wound Image Images linked: 1    Dressing Type Gauze (Comment)   vaseline/lotion perimeter, medihoney, 2x2, kerlix, extra cotton for shape, coban with netting.   Dressing Status Old drainage;Clean, Dry, Intact    Dressing Change Frequency PRN  Site / Wound Assessment Brown    % Wound base Red or Granulating 20%    % Wound base Yellow/Fibrinous Exudate 80%    Peri-wound Assessment Edema    Wound Length (cm) 1.2 cm    Wound Width (cm) 0.8 cm    Wound Surface Area (cm^2) 0.96 cm^2    Drainage Amount Scant    Drainage Description Serous    Treatment Cleansed;Debridement (Selective)    Selective Debridement (non-excisional) - Location wound bed    Selective Debridement (non-excisional) - Tools Used Forceps;Scalpel;Scissors    Selective Debridement (non-excisional) - Tissue Removed eschar and other devitalized tissues.    Wound Therapy - Clinical Statement Pt is an 87 yo male who comes to skilled therapy for wound care on his heel. The pt has recently been hospitalized for cellulitis in his leg and his daughter states that during the hospital stay he developed a wound on his heel.  Upon arrival the pt has significant edema in his leg, his heel is black prior to debridement, however, after debridement the pt has two wounds on his heal as described  above.  Mr. Gueli will benefit from skilled PT to maintain a healing environmentSee below for daily progress. Evaluation:  to allow his wound to heal    Wound Therapy - Functional Problem List difficulty bathing, wearing a shoe,    Factors Delaying/Impairing Wound Healing Altered sensation;Infection - systemic/local;Immobility;Multiple medical problems;Polypharmacy;Vascular compromise    Hydrotherapy Plan Debridement;Dressing change;Patient/family education    Wound Therapy - Frequency 2X / week    Wound Therapy - Current Recommendations PT    Wound Plan control edema, debridement and dressing change.    Dressing  medihoney, 4x4, kerlix, extra cotton, coban, netting    Manual Therapy decongestive techniques completed to decrease edema in foot and lower leg.               PATIENT EDUCATION: Education details: complete ankle pumps, increase protein intake  Person educated: Patient Education method: Explanation Education comprehension: verbalized understanding   HOME EXERCISE PROGRAM: Ankle pumps    GOALS: Goals reviewed with patient? No  SHORT TERM GOALS: Target date: 12/12/22  Pt wounds to be 100% granulated Baseline: Goal status: IN PROGRESS  2.  Pt pain to be no greater than a 3 to allow pt to assist with transfers once again.  Baseline:  Goal status: IN PROGRESS    LONG TERM GOALS: Target date: 01/09/23  Pt wounds to be healed to decrease risk of recurrent cellulitis  Baseline:  Goal status: IN PROGRESS  2.  Pt to voice no pain to allow pt to be completing at least 50% of transferring  Baseline:  Goal status: IN PROGRESS  ASSESSMENT:  CLINICAL IMPRESSION:  Overall edema has reduced though continues to have edema present toes and metatarsal region, decongestive techniques complete to assist with edema control.  Selective debridement for removal of slough from wound bed and dry skin perimeter to promote healing.  Continued with medihoney and kerlix/coban for  edema control.  Assess decreased compression next session for edema control.  Reoprts of comfort at EOS.  From evaluation:  Pt is an 87 yo male who comes to skilled therapy for wound care on his heel. The pt has recently been hospitalized for cellulitis in his leg and his daughter states that during the hospital stay he developed a wound on his heel.  Upon arrival the pt has significant edema in his leg, his heel is black prior to debridement, however,  after debridement the pt has two wounds on his heal as described above.  Mr. Golson will benefit from skilled PT to maintain a healing environment to allow his wound to heal   OBJECTIVE IMPAIRMENTS: decreased mobility, difficulty walking, decreased ROM, decreased strength, increased edema, pain, and decreased skin integrity .   ACTIVITY LIMITATIONS: bathing, dressing, and mobility    PERSONAL FACTORS: Age, Fitness, and Past/current experiences are also affecting patient's functional outcome.   REHAB POTENTIAL: Good  CLINICAL DECISION MAKING: Evolving/moderate complexity  EVALUATION COMPLEXITY: Moderate  PLAN: PT FREQUENCY: 2x/week  PT DURATION: 8 weeks  PLANNED INTERVENTIONS: 97110-Therapeutic exercises, 97535- Self Care, 16109- Manual therapy, and debridement with dressing change.   PLAN FOR NEXT SESSION: continue with debridement and appropriate dressing changes. Take pictures and measure weekly.   Becky Sax, LPTA/CLT; CBIS [phone]  Juel Burrow, PTA 12/02/2022, 11:01 AM  12/02/2022, 11:01 AM

## 2022-12-05 ENCOUNTER — Ambulatory Visit (HOSPITAL_COMMUNITY): Payer: Medicare Other | Admitting: Physical Therapy

## 2022-12-05 DIAGNOSIS — L89623 Pressure ulcer of left heel, stage 3: Secondary | ICD-10-CM

## 2022-12-05 DIAGNOSIS — M79672 Pain in left foot: Secondary | ICD-10-CM

## 2022-12-05 NOTE — Therapy (Signed)
OUTPATIENT PHYSICAL THERAPY WOUNDCARE   Patient Name: Curtis Jenkins MRN: [id number] DOB:09-13-1935, 87 y.o., male Today's Date: 12/05/2022   PCP: Elfredia Nevins, MD REFERRING PROVIDER: Elfredia Nevins, MD  END OF SESSION:  PT End of Session - 12/05/22 1458     Visit Number 7    Number of Visits 16    Date for PT Re-Evaluation 01/13/23    Authorization Type approved 16 visits from 11/1 - 12/27    Authorization - Visit Number 7    Authorization - Number of Visits 16    Progress Note Due on Visit 10    PT Start Time 0930    PT Stop Time 1015    PT Time Calculation (min) 45 min    Activity Tolerance Patient tolerated treatment well    Behavior During Therapy [facility] for tasks assessed/performed              Past Medical History:  Diagnosis Date   Arthritis    Atrial fibrillation (HCC) 03/2019   Chronic kidney disease    Dysrhythmia    Hyperlipidemia    Hypertension    Sleep apnea    Stroke (HCC) 03/2019   left sided weakness   Past Surgical History:  Procedure Laterality Date   BACK SURGERY     CATARACT EXTRACTION W/PHACO Left 07/08/2021   Procedure: CATARACT EXTRACTION PHACO AND INTRAOCULAR LENS PLACEMENT (IOC);  Surgeon: Fabio Pierce, MD;  Location: [facility];  Service: Ophthalmology;  Laterality: Left;  CDE: 16.23   CYSTOSCOPY N/A 09/10/2020   Procedure: CYSTOSCOPY;  Surgeon: Malen Gauze, MD;  Location: [facility];  Service: Urology;  Laterality: N/A;   CYSTOSCOPY W/ RETROGRADES Bilateral 08/11/2019   Procedure: CYSTOSCOPY WITH RETROGRADE PYELOGRAM;  Surgeon: Malen Gauze, MD;  Location: [facility];  Service: Urology;  Laterality: Bilateral;   left knee surgery     LOOP RECORDER INSERTION N/A 04/12/2019   Procedure: LOOP RECORDER INSERTION;  Surgeon: Marinus Maw, MD;  Location: [facility];  Service: Cardiovascular;  Laterality: N/A;   TRANSURETHRAL RESECTION OF BLADDER TUMOR Bilateral 08/11/2019   Procedure: TRANSURETHRAL RESECTION OF BLADDER  TUMOR (TURBT);  Surgeon: Malen Gauze, MD;  Location: [facility];  Service: Urology;  Laterality: Bilateral;   TRANSURETHRAL RESECTION OF BLADDER TUMOR N/A 09/10/2020   Procedure: TRANSURETHRAL RESECTION OF BLADDER TUMOR (TURBT);  Surgeon: Malen Gauze, MD;  Location: [facility];  Service: Urology;  Laterality: N/A;   Patient Active Problem List   Diagnosis Date Noted   Septic shock (HCC) 10/17/2022   Pressure injury of skin 10/17/2022   Acute-on-chronic kidney injury (HCC) 10/17/2022   Cellulitis 10/17/2022   Lesion of bladder 11/16/2019   PAF (paroxysmal atrial fibrillation) (HCC) 05/05/2019   Effusion of left elbow 04/12/2019   Chronic left shoulder pain 04/12/2019   Rt Occipital Lobe Stroke with Lt hemiparesis 04/09/2019   Dysphagia following cerebrovascular accident 04/09/2019   Speech disturbance due to Acute CVA 04/09/2019   Tobacco abuse-Chews Tobacco 04/09/2019   Paresthesia    Hypocalcemia 04/03/2019   Prolonged QT interval 04/03/2019   HTN (hypertension) 04/03/2019   HLD (hyperlipidemia) 04/03/2019   Weakness 04/03/2019   Per chart review:  Admit date: 10/17/2022   Discharge date: 10/20/2022 Curtis Jenkins is a 87 y.o. male with medical history significant of hypertension, stage III chronic kidney disease, atrial fibrillation on anticoagulation, hyperlipidemia, history of stroke with residual left-sided weakness.  Patient presents with fever, swelling of his left leg with erythema  and pain.  With him starting fevers last week, he was started on Paxlovid to cover COVID, then was given doxycycline, then clindamycin for cellulitis.  As his symptoms were not improving, his daughter called EMS who brought him here for evaluation.  When EMS arrived, he was hypotensive.  They gave him some IV fluids with some improvement of his hypotension.  His cellulitis has markedly improved with the use of vancomycin and cefepime and his MRSA nares was negative.  No growth on blood cultures  noted.  MRI of the lower extremity had significant motion artifact, however did not appear to demonstrate any findings of osteomyelitis.  Additionally his ABI studies indicate mild PAD.  He appears to be much improved and now in stable condition for discharge on oral antibiotics with doxycycline and Keflex as prescribed.  No other acute events or concerns noted throughout the course of this admission.  ONSET DATE: 10/20/22  REFERRING DIAG: necrotic decubitus of heel  THERAPY DIAG:  Pain in left foot  Pressure ulcer of left heel, stage 3 (HCC)  Rationale for Evaluation and Treatment: Rehabilitation  Subjective from evaluation:  Information is obtained via daughter. PT was hospitalized for cellulitis of his Lt Leg. While he was in the hospital he developed a wound on his left heel. He has been discharged with [facility] nursing to care for his heel, however, the [facility] nurse felt that the pt needed more extensive care and recommended OP PT for debridement.     Wound Therapy - 12/05/22 0001     Subjective Pt arrived with family members who assisted with transfer to mat.  Dressings intact, no reoprts of pain.    Patient and Family Stated Goals wound to heal    Date of Onset 11/10/22    Pain Scale 0-10    Pain Score 2     Evaluation and Treatment Procedures Explained to Patient/Family Yes    Evaluation and Treatment Procedures agreed to    Wound Properties Date First Assessed: 11/14/22 Time First Assessed: 1200 Wound Type: Other (Comment) , pressure  Location: Heel Location Orientation: Left Wound Description (Comments): once black eschar has been removed this is the most lateral aspect Present on Admission: Yes   Dressing Type Gauze (Comment)   vaseline/lotion perimeter, medihoney, 2x2, kerlix, extra cotton for shape, coban with netting.   Dressing Changed Changed    Dressing Status Old drainage;Clean, Dry, Intact    Dressing Change Frequency PRN    Site / Wound Assessment Dusky;Brown    % Wound base  Red or Granulating 40%    % Wound base Yellow/Fibrinous Exudate 60%    Peri-wound Assessment Edema;Induration;Erythema (blanchable)    Wound Length (cm) 0.8 cm    Wound Width (cm) 0.8 cm    Wound Depth (cm) 1 cm    Wound Volume (cm^3) 0.64 cm^3    Wound Surface Area (cm^2) 0.64 cm^2    Drainage Amount Scant    Drainage Description Serous    Treatment Cleansed;Debridement (Selective)    Wound Properties Date First Assessed: 11/14/22 Time First Assessed: 1210 Wound Type: Other (Comment) , pressure  Location: Heel Location Orientation: Left Wound Description (Comments): medial wound Present on Admission: Yes   Dressing Type Gauze (Comment)   vaseline/lotion perimeter, medihoney, 2x2, kerlix, extra cotton for shape, coban with netting.   Dressing Status Old drainage;Clean, Dry, Intact    Dressing Change Frequency PRN    Site / Wound Assessment Brown;Granulation tissue    % Wound base Red or  Granulating 25%    % Wound base Yellow/Fibrinous Exudate 75%    Peri-wound Assessment Edema    Wound Length (cm) 1 cm    Wound Width (cm) 0.9 cm    Wound Surface Area (cm^2) 0.9 cm^2    Drainage Amount Scant    Drainage Description Serous    Treatment Cleansed;Debridement (Selective)    Selective Debridement (non-excisional) - Location wound bed    Selective Debridement (non-excisional) - Tools Used Forceps;Scalpel;Scissors    Selective Debridement (non-excisional) - Tissue Removed eschar and other devitalized tissues.    Wound Therapy - Clinical Statement Pt is an 87 yo male who comes to skilled therapy for wound care on his heel. The pt has recently been hospitalized for cellulitis in his leg and his daughter states that during the hospital stay he developed a wound on his heel.  Upon arrival the pt has significant edema in his leg, his heel is black prior to debridement, however, after debridement the pt has two wounds on his heal as described above.  Curtis Jenkins will benefit from skilled PT to maintain  a healing environmentSee below for daily progress. Evaluation:  to allow his wound to heal    Wound Therapy - Functional Problem List difficulty bathing, wearing a shoe,    Factors Delaying/Impairing Wound Healing Altered sensation;Infection - systemic/local;Immobility;Multiple medical problems;Polypharmacy;Vascular compromise    Hydrotherapy Plan Debridement;Dressing change;Patient/family education    Wound Therapy - Frequency 2X / week    Wound Therapy - Current Recommendations PT    Wound Plan control edema, debridement and dressing change.    Dressing  medihoney, 4x4, kerlix, extra cotton, coban, netting    Manual Therapy decongestive techniques completed to decrease edema in foot and lower leg.               PATIENT EDUCATION: Education details: complete ankle pumps, increase protein intake  Person educated: Patient Education method: Explanation Education comprehension: verbalized understanding   HOME EXERCISE PROGRAM: Ankle pumps    GOALS: Goals reviewed with patient? No  SHORT TERM GOALS: Target date: 12/12/22  Pt wounds to be 100% granulated Baseline: Goal status: IN PROGRESS  2.  Pt pain to be no greater than a 3 to allow pt to assist with transfers once again.  Baseline:  Goal status: IN PROGRESS    LONG TERM GOALS: Target date: 01/09/23  Pt wounds to be healed to decrease risk of recurrent cellulitis  Baseline:  Goal status: IN PROGRESS  2.  Pt to voice no pain to allow pt to be completing at least 50% of transferring  Baseline:  Goal status: IN PROGRESS  ASSESSMENT:  CLINICAL IMPRESSION:  PT continues to have significant edema present in his foot. Therapist completed decongestive techniques and also added foam to dorsal aspect of foot and malleolus.  Therapist cut a hole out of 1/2 " foam and put it around heel wounds to attempt pressure relief.  Selective debridement for removal of slough from wound bed and dry skin perimeter to promote healing.   Noted approximation of lateral wound.  Continued with medihoney and kerlix/coban for edema control.  Assess how adding foam did to decrease edema in foot next treatment. Reoprts of comfort at EOS. OBJECTIVE IMPAIRMENTS: decreased mobility, difficulty walking, decreased ROM, decreased strength, increased edema, pain, and decreased skin integrity .   ACTIVITY LIMITATIONS: bathing, dressing, and mobility    PERSONAL FACTORS: Age, Fitness, and Past/current experiences are also affecting patient's functional outcome.   REHAB POTENTIAL: Good  CLINICAL DECISION MAKING: Evolving/moderate complexity  EVALUATION COMPLEXITY: Moderate  PLAN: PT FREQUENCY: 2x/week  PT DURATION: 8 weeks  PLANNED INTERVENTIONS: 97110-Therapeutic exercises, 97535- Self Care, 40981- Manual therapy, and debridement with dressing change.   PLAN FOR NEXT SESSION: continue with debridement and appropriate dressing changes. Take pictures and measure weekly.  Virgina Organ, PT CLT [phone]  12/05/2022, 3:05 PM

## 2022-12-08 ENCOUNTER — Ambulatory Visit (HOSPITAL_COMMUNITY): Payer: Medicare Other | Admitting: Physical Therapy

## 2022-12-08 DIAGNOSIS — L89623 Pressure ulcer of left heel, stage 3: Secondary | ICD-10-CM | POA: Diagnosis not present

## 2022-12-08 DIAGNOSIS — M79672 Pain in left foot: Secondary | ICD-10-CM | POA: Diagnosis not present

## 2022-12-08 NOTE — Therapy (Signed)
OUTPATIENT PHYSICAL THERAPY WOUNDCARE   Patient Name: Curtis Jenkins MRN: [id number] DOB:May 09, 1935, 87 y.o., male Today's Date: 12/08/2022   PCP: Elfredia Nevins, MD REFERRING PROVIDER: Elfredia Nevins, MD  END OF SESSION:  PT End of Session - 12/08/22 1131     Visit Number 8    Number of Visits 16    Date for PT Re-Evaluation 01/13/23    Authorization Type approved 16 visits from 11/1 - 12/27    Authorization - Visit Number 8    Authorization - Number of Visits 16    Progress Note Due on Visit 10    PT Start Time 1105    PT Stop Time 1130    PT Time Calculation (min) 25 min    Activity Tolerance Patient tolerated treatment well    Behavior During Therapy [facility] for tasks assessed/performed              Past Medical History:  Diagnosis Date   Arthritis    Atrial fibrillation (HCC) 03/2019   Chronic kidney disease    Dysrhythmia    Hyperlipidemia    Hypertension    Sleep apnea    Stroke (HCC) 03/2019   left sided weakness   Past Surgical History:  Procedure Laterality Date   BACK SURGERY     CATARACT EXTRACTION W/PHACO Left 07/08/2021   Procedure: CATARACT EXTRACTION PHACO AND INTRAOCULAR LENS PLACEMENT (IOC);  Surgeon: Fabio Pierce, MD;  Location: [facility];  Service: Ophthalmology;  Laterality: Left;  CDE: 16.23   CYSTOSCOPY N/A 09/10/2020   Procedure: CYSTOSCOPY;  Surgeon: Malen Gauze, MD;  Location: [facility];  Service: Urology;  Laterality: N/A;   CYSTOSCOPY W/ RETROGRADES Bilateral 08/11/2019   Procedure: CYSTOSCOPY WITH RETROGRADE PYELOGRAM;  Surgeon: Malen Gauze, MD;  Location: [facility];  Service: Urology;  Laterality: Bilateral;   left knee surgery     LOOP RECORDER INSERTION N/A 04/12/2019   Procedure: LOOP RECORDER INSERTION;  Surgeon: Marinus Maw, MD;  Location: [facility];  Service: Cardiovascular;  Laterality: N/A;   TRANSURETHRAL RESECTION OF BLADDER TUMOR Bilateral 08/11/2019   Procedure: TRANSURETHRAL RESECTION OF BLADDER  TUMOR (TURBT);  Surgeon: Malen Gauze, MD;  Location: [facility];  Service: Urology;  Laterality: Bilateral;   TRANSURETHRAL RESECTION OF BLADDER TUMOR N/A 09/10/2020   Procedure: TRANSURETHRAL RESECTION OF BLADDER TUMOR (TURBT);  Surgeon: Malen Gauze, MD;  Location: [facility];  Service: Urology;  Laterality: N/A;   Patient Active Problem List   Diagnosis Date Noted   Septic shock (HCC) 10/17/2022   Pressure injury of skin 10/17/2022   Acute-on-chronic kidney injury (HCC) 10/17/2022   Cellulitis 10/17/2022   Lesion of bladder 11/16/2019   PAF (paroxysmal atrial fibrillation) (HCC) 05/05/2019   Effusion of left elbow 04/12/2019   Chronic left shoulder pain 04/12/2019   Rt Occipital Lobe Stroke with Lt hemiparesis 04/09/2019   Dysphagia following cerebrovascular accident 04/09/2019   Speech disturbance due to Acute CVA 04/09/2019   Tobacco abuse-Chews Tobacco 04/09/2019   Paresthesia    Hypocalcemia 04/03/2019   Prolonged QT interval 04/03/2019   HTN (hypertension) 04/03/2019   HLD (hyperlipidemia) 04/03/2019   Weakness 04/03/2019   Per chart review:  Admit date: 10/17/2022   Discharge date: 10/20/2022 Curtis Jenkins is a 87 y.o. male with medical history significant of hypertension, stage III chronic kidney disease, atrial fibrillation on anticoagulation, hyperlipidemia, history of stroke with residual left-sided weakness.  Patient presents with fever, swelling of his left leg with erythema  and pain.  With him starting fevers last week, he was started on Paxlovid to cover COVID, then was given doxycycline, then clindamycin for cellulitis.  As his symptoms were not improving, his daughter called EMS who brought him here for evaluation.  When EMS arrived, he was hypotensive.  They gave him some IV fluids with some improvement of his hypotension.  His cellulitis has markedly improved with the use of vancomycin and cefepime and his MRSA nares was negative.  No growth on blood cultures  noted.  MRI of the lower extremity had significant motion artifact, however did not appear to demonstrate any findings of osteomyelitis.  Additionally his ABI studies indicate mild PAD.  He appears to be much improved and now in stable condition for discharge on oral antibiotics with doxycycline and Keflex as prescribed.  No other acute events or concerns noted throughout the course of this admission.  ONSET DATE: 10/20/22  REFERRING DIAG: necrotic decubitus of heel  THERAPY DIAG:  Pain in left foot  Pressure ulcer of left heel, stage 3 (HCC)  Rationale for Evaluation and Treatment: Rehabilitation  Subjective from evaluation:  Information is obtained via daughter. PT was hospitalized for cellulitis of his Lt Leg. While he was in the hospital he developed a wound on his left heel. He has been discharged with [facility] nursing to care for his heel, however, the [facility] nurse felt that the pt needed more extensive care and recommended OP PT for debridement.     Wound Therapy - 12/08/22 0001     Subjective PT states that the dressing was fine    Patient and Family Stated Goals wound to heal    Date of Onset 11/10/22    Pain Scale 0-10    Pain Score 2     Evaluation and Treatment Procedures Explained to Patient/Family Yes    Evaluation and Treatment Procedures agreed to    Wound Properties Date First Assessed: 11/14/22 Time First Assessed: 1200 Wound Type: Other (Comment) , pressure  Location: Heel Location Orientation: Left Wound Description (Comments): once black eschar has been removed this is the most lateral aspect Present on Admission: Yes   Dressing Type Gauze (Comment)   vaseline/lotion perimeter, medihoney, 2x2, kerlix, extra cotton for shape, coban with netting.   Dressing Status Old drainage;Clean, Dry, Intact    Dressing Change Frequency PRN    Site / Wound Assessment Dusky;Brown    % Wound base Red or Granulating 40%    % Wound base Yellow/Fibrinous Exudate 60%    Peri-wound Assessment  Edema;Induration;Erythema (blanchable)    Drainage Amount Scant    Drainage Description Serous    Treatment Cleansed;Debridement (Selective)    Wound Properties Date First Assessed: 11/14/22 Time First Assessed: 1210 Wound Type: Other (Comment) , pressure  Location: Heel Location Orientation: Left Wound Description (Comments): medial wound Present on Admission: Yes   Dressing Type Gauze (Comment)   vaseline/lotion perimeter, medihoney, 2x2, kerlix, extra cotton for shape, coban with netting.   Dressing Status Old drainage;Clean, Dry, Intact    Dressing Change Frequency PRN    Site / Wound Assessment Brown;Granulation tissue    % Wound base Red or Granulating 25%    % Wound base Yellow/Fibrinous Exudate 75%    Peri-wound Assessment Edema    Drainage Amount Scant    Drainage Description Serous    Treatment Cleansed;Debridement (Selective)    Selective Debridement (non-excisional) - Location wound bed    Selective Debridement (non-excisional) - Tools Used Forceps;Scalpel;Scissors  Selective Debridement (non-excisional) - Tissue Removed eschar and other devitalized tissues.    Wound Therapy - Clinical Statement New dressing worked well to decrease edema in foot.    Wound Therapy - Functional Problem List difficulty bathing, wearing a shoe,    Factors Delaying/Impairing Wound Healing Altered sensation;Infection - systemic/local;Immobility;Multiple medical problems;Polypharmacy;Vascular compromise    Hydrotherapy Plan Debridement;Dressing change;Patient/family education    Wound Therapy - Frequency 2X / week    Wound Therapy - Current Recommendations PT    Wound Plan control edema, debridement and dressing change.    Dressing  medihoney, 4x4, kerlix, 1/2" foam for forefoot and ankles.  Cut out 1/2 " foam for heel to decrease pressure on heel.  extra cotton, coban, netting    Manual Therapy decongestive techniques completed to decrease edema in foot and lower leg.               PATIENT  EDUCATION: Education details: complete ankle pumps, increase protein intake  Person educated: Patient Education method: Explanation Education comprehension: verbalized understanding   HOME EXERCISE PROGRAM: Ankle pumps    GOALS: Goals reviewed with patient? No  SHORT TERM GOALS: Target date: 12/12/22  Pt wounds to be 100% granulated Baseline: Goal status: IN PROGRESS  2.  Pt pain to be no greater than a 3 to allow pt to assist with transfers once again.  Baseline:  Goal status: IN PROGRESS    LONG TERM GOALS: Target date: 01/09/23  Pt wounds to be healed to decrease risk of recurrent cellulitis  Baseline:  Goal status: IN PROGRESS  2.  Pt to voice no pain to allow pt to be completing at least 50% of transferring  Baseline:  Goal status: IN PROGRESS  ASSESSMENT:  CLINICAL IMPRESSION:  Addition of foam to dressing assisted in decreasing edema.  Selective debridement for removal of slough from wound bed and dry skin perimeter to promote healing.  Noted approximation of lateral wound.    Continued with medihoney and kerlix/coban for edema control.  OBJECTIVE IMPAIRMENTS: decreased mobility, difficulty walking, decreased ROM, decreased strength, increased edema, pain, and decreased skin integrity .   ACTIVITY LIMITATIONS: bathing, dressing, and mobility    PERSONAL FACTORS: Age, Fitness, and Past/current experiences are also affecting patient's functional outcome.   REHAB POTENTIAL: Good  CLINICAL DECISION MAKING: Evolving/moderate complexity  EVALUATION COMPLEXITY: Moderate  PLAN: PT FREQUENCY: 2x/week  PT DURATION: 8 weeks  PLANNED INTERVENTIONS: 97110-Therapeutic exercises, 97535- Self Care, 40981- Manual therapy, and debridement with dressing change.   PLAN FOR NEXT SESSION: continue with debridement and appropriate dressing changes. Take pictures and measure weekly.  Virgina Organ, PT CLT [phone]  12/08/2022, 11:36 AM

## 2022-12-10 ENCOUNTER — Ambulatory Visit (HOSPITAL_COMMUNITY): Payer: Medicare Other | Admitting: Physical Therapy

## 2022-12-15 ENCOUNTER — Ambulatory Visit (HOSPITAL_COMMUNITY): Payer: Medicare Other | Attending: Internal Medicine | Admitting: Physical Therapy

## 2022-12-15 DIAGNOSIS — M79672 Pain in left foot: Secondary | ICD-10-CM | POA: Diagnosis not present

## 2022-12-15 DIAGNOSIS — L89623 Pressure ulcer of left heel, stage 3: Secondary | ICD-10-CM | POA: Diagnosis not present

## 2022-12-15 NOTE — Therapy (Signed)
OUTPATIENT PHYSICAL THERAPY WOUNDCARE   Patient Name: Curtis Jenkins MRN: [id number] DOB:February 21, 1935, 87 y.o., male Today's Date: 12/15/2022   PCP: Curtis Nevins, MD REFERRING PROVIDER: Elfredia Nevins, MD  END OF SESSION:  PT End of Session - 12/15/22 1056     Visit Number 9    Number of Visits 16    Date for PT Re-Evaluation 01/13/23    Authorization Type approved 16 visits from 11/1 - 12/27    Authorization - Visit Number 9    Authorization - Number of Visits 16    Progress Note Due on Visit 10    PT Start Time 1015    PT Stop Time 1053    PT Time Calculation (min) 38 min    Activity Tolerance Patient tolerated treatment well    Behavior During Therapy [facility] for tasks assessed/performed              Past Medical History:  Diagnosis Date   Arthritis    Atrial fibrillation (HCC) 03/2019   Chronic kidney disease    Dysrhythmia    Hyperlipidemia    Hypertension    Sleep apnea    Stroke (HCC) 03/2019   left sided weakness   Past Surgical History:  Procedure Laterality Date   BACK SURGERY     CATARACT EXTRACTION W/PHACO Left 07/08/2021   Procedure: CATARACT EXTRACTION PHACO AND INTRAOCULAR LENS PLACEMENT (IOC);  Surgeon: Curtis Pierce, MD;  Location: [facility];  Service: Ophthalmology;  Laterality: Left;  CDE: 16.23   CYSTOSCOPY N/A 09/10/2020   Procedure: CYSTOSCOPY;  Surgeon: Curtis Gauze, MD;  Location: [facility];  Service: Urology;  Laterality: N/A;   CYSTOSCOPY W/ RETROGRADES Bilateral 08/11/2019   Procedure: CYSTOSCOPY WITH RETROGRADE PYELOGRAM;  Surgeon: Curtis Gauze, MD;  Location: [facility];  Service: Urology;  Laterality: Bilateral;   left knee surgery     LOOP RECORDER INSERTION N/A 04/12/2019   Procedure: LOOP RECORDER INSERTION;  Surgeon: Marinus Maw, MD;  [facility]: [facility];  Service: Cardiovascular;  Laterality: N/A;   TRANSURETHRAL RESECTION OF BLADDER TUMOR Bilateral 08/11/2019   Procedure: TRANSURETHRAL RESECTION OF BLADDER  TUMOR (TURBT);  Surgeon: Curtis Gauze, MD;  Location: [facility];  Service: Urology;  Laterality: Bilateral;   TRANSURETHRAL RESECTION OF BLADDER TUMOR N/A 09/10/2020   Procedure: TRANSURETHRAL RESECTION OF BLADDER TUMOR (TURBT);  Surgeon: Curtis Gauze, MD;  Location: [facility];  Service: Urology;  Laterality: N/A;   Patient Active Problem List   Diagnosis Date Noted   Septic shock (HCC) 10/17/2022   Pressure injury of skin 10/17/2022   Acute-on-chronic kidney injury (HCC) 10/17/2022   Cellulitis 10/17/2022   Lesion of bladder 11/16/2019   PAF (paroxysmal atrial fibrillation) (HCC) 05/05/2019   Effusion of left elbow 04/12/2019   Chronic left shoulder pain 04/12/2019   Rt Occipital Lobe Stroke with Lt hemiparesis 04/09/2019   Dysphagia following cerebrovascular accident 04/09/2019   Speech disturbance due to Acute CVA 04/09/2019   Tobacco abuse-Chews Tobacco 04/09/2019   Paresthesia    Hypocalcemia 04/03/2019   Prolonged QT interval 04/03/2019   HTN (hypertension) 04/03/2019   HLD (hyperlipidemia) 04/03/2019   Weakness 04/03/2019   Per chart review:  Admit date: 10/17/2022   Discharge date: 10/20/2022 Curtis Jenkins is a 87 y.o. male with medical history significant of hypertension, stage III chronic kidney disease, atrial fibrillation on anticoagulation, hyperlipidemia, history of stroke with residual left-sided weakness.  Patient presents with fever, swelling of his left leg with erythema  and pain.  With him starting fevers last week, he was started on Paxlovid to cover COVID, then was given doxycycline, then clindamycin for cellulitis.  As his symptoms were not improving, his daughter called EMS who brought him here for evaluation.  When EMS arrived, he was hypotensive.  They gave him some IV fluids with some improvement of his hypotension.  His cellulitis has markedly improved with the use of vancomycin and cefepime and his MRSA nares was negative.  No growth on blood cultures  noted.  MRI of the lower extremity had significant motion artifact, however did not appear to demonstrate any findings of osteomyelitis.  Additionally his ABI studies indicate mild PAD.  He appears to be much improved and now in stable condition for discharge on oral antibiotics with doxycycline and Keflex as prescribed.  No other acute events or concerns noted throughout the course of this admission.  ONSET DATE: 10/20/22  REFERRING DIAG: necrotic decubitus of heel  THERAPY DIAG:  Pain in left foot  Pressure ulcer of left heel, stage 3 (HCC)  Rationale for Evaluation and Treatment: Rehabilitation  Subjective from evaluation:  Information is obtained via daughter. PT was hospitalized for cellulitis of his Lt Leg. While he was in the hospital he developed a wound on his left heel. He has been discharged with [facility] nursing to care for his heel, however, the [facility] nurse felt that the pt needed more extensive care and recommended OP PT for debridement.     Wound Therapy - 12/15/22 0001     Subjective PT states that the dressing was fine    Patient and Family Stated Goals wound to heal    Date of Onset 11/10/22    Pain Scale 0-10    Pain Score 2     Evaluation and Treatment Procedures Explained to Patient/Family Yes    Evaluation and Treatment Procedures agreed to    Wound Properties Date First Assessed: 11/14/22 Time First Assessed: 1200 Wound Type: Other (Comment) , pressure  Location: Heel Location Orientation: Left Wound Description (Comments): once black eschar has been removed this is the most lateral aspect Present on Admission: Yes   Dressing Type Jenkins (Comment)   vaseline/lotion perimeter, medihoney, 2x2, kerlix, extra cotton for shape, coban with netting.   Dressing Changed Changed    Dressing Status Old drainage;Clean, Dry, Intact    Dressing Change Frequency PRN    Site / Wound Assessment Dusky;Brown;Yellow    % Wound base Red or Granulating 50%    % Wound base Yellow/Fibrinous  Exudate 50%    Peri-wound Assessment Edema;Induration;Erythema (blanchable)    Wound Length (cm) 0.3 cm    Wound Width (cm) 1 cm    Wound Surface Area (cm^2) 0.3 cm^2    Drainage Amount Scant    Drainage Description Serosanguineous    Treatment Cleansed;Debridement (Selective)    Wound Properties Date First Assessed: 11/14/22 Time First Assessed: 1210 Wound Type: Other (Comment) , pressure  Location: Heel Location Orientation: Left Wound Description (Comments): medial wound Present on Admission: Yes   Dressing Type Jenkins (Comment)   vaseline/lotion perimeter, medihoney, 2x2, kerlix, extra cotton for shape, coban with netting.   Dressing Status Old drainage;Clean, Dry, Intact    Dressing Change Frequency PRN    Site / Wound Assessment Granulation tissue;Yellow    % Wound base Red or Granulating 40%    % Wound base Yellow/Fibrinous Exudate 60%    Peri-wound Assessment Edema    Wound Length (cm) 0.8 cm  Wound Width (cm) 0.7 cm    Wound Surface Area (cm^2) 0.56 cm^2    Drainage Amount Scant    Drainage Description Serous    Treatment Debridement (Selective);Cleansed    Selective Debridement (non-excisional) - Location wound bed    Selective Debridement (non-excisional) - Tools Used Forceps;Scalpel;Scissors    Selective Debridement (non-excisional) - Tissue Removed eschar and other devitalized tissues.    Wound Therapy - Clinical Statement New dressing worked well to decrease edema in foot.    Wound Therapy - Functional Problem List difficulty bathing, wearing a shoe,    Factors Delaying/Impairing Wound Healing Altered sensation;Infection - systemic/local;Immobility;Multiple medical problems;Polypharmacy;Vascular compromise    Hydrotherapy Plan Debridement;Dressing change;Patient/family education    Wound Therapy - Frequency 2X / week    Wound Therapy - Current Recommendations PT    Wound Plan control edema, debridement and dressing change.    Dressing  medihoney, 4x4, kerlix, 1/2" foam  for forefoot and ankles.  Cut out 1/2 " foam for heel to decrease pressure on heel.  extra cotton, coban, netting    Manual Therapy decongestive techniques completed to decrease edema in foot and lower leg.               PATIENT EDUCATION: Education details: complete ankle pumps, increase protein intake  Person educated: Patient Education method: Explanation Education comprehension: verbalized understanding   HOME EXERCISE PROGRAM: Ankle pumps    GOALS: Goals reviewed with patient? No  SHORT TERM GOALS: Target date: 12/12/22  Pt wounds to be 100% granulated Baseline: Goal status: IN PROGRESS  2.  Pt pain to be no greater than a 3 to allow pt to assist with transfers once again.  Baseline:  Goal status: IN PROGRESS    LONG TERM GOALS: Target date: 01/09/23  Pt wounds to be healed to decrease risk of recurrent cellulitis  Baseline:  Goal status: IN PROGRESS  2.  Pt to voice no pain to allow pt to be completing at least 50% of transferring  Baseline:  Goal status: IN PROGRESS  ASSESSMENT:  CLINICAL IMPRESSION:  Wounds continue to improve.  It is very difficult for family to bring pt to treatment.  Therapist believes wounds are at a point [facility] nursing can take care of them.  Family agree and will call MD today  OBJECTIVE IMPAIRMENTS: decreased mobility, difficulty walking, decreased ROM, decreased strength, increased edema, pain, and decreased skin integrity .   ACTIVITY LIMITATIONS: bathing, dressing, and mobility    PERSONAL FACTORS: Age, Fitness, and Past/current experiences are also affecting patient's functional outcome.   REHAB POTENTIAL: Good  CLINICAL DECISION MAKING: Evolving/moderate complexity  EVALUATION COMPLEXITY: Moderate  PLAN: PT FREQUENCY: 2x/week  PT DURATION: 8 weeks  PLANNED INTERVENTIONS: 97110-Therapeutic exercises, 97535- Self Care, 19147- Manual therapy, and debridement with dressing change.   PLAN FOR NEXT SESSION: continue  with debridement and appropriate dressing changes until [facility] nursing is available for care. Virgina Organ, PT CLT [phone]  12/15/2022, 11:00 AM

## 2022-12-18 ENCOUNTER — Ambulatory Visit (HOSPITAL_COMMUNITY): Payer: Medicare Other | Admitting: Physical Therapy

## 2022-12-18 DIAGNOSIS — M79672 Pain in left foot: Secondary | ICD-10-CM | POA: Diagnosis not present

## 2022-12-18 DIAGNOSIS — L89623 Pressure ulcer of left heel, stage 3: Secondary | ICD-10-CM

## 2022-12-18 NOTE — Therapy (Signed)
OUTPATIENT PHYSICAL THERAPY WOUNDCARE/progress   Patient Name: Curtis Jenkins MRN: [id number] DOB:[birth year], 87 y.o., male Today's Date: 12/18/2022  Progress Note Reporting Period 11/14/22 to 12/18/22  See note below for Objective Data and Assessment of Progress/Goals.     PCP: Elfredia Nevins, MD REFERRING PROVIDER: Elfredia Nevins, MD  END OF SESSION:  PT End of Session - 12/18/22 0934     Visit Number 10    Number of Visits 16    Date for PT Re-Evaluation 01/13/23    Authorization Type approved 16 visits from 11/1 - 12/27    Authorization - Visit Number 10    Authorization - Number of Visits 16    Progress Note Due on Visit 16    PT Start Time 0855    PT Stop Time 0925    PT Time Calculation (min) 30 min    Activity Tolerance Patient tolerated treatment well    Behavior During Therapy [facility] for tasks assessed/performed              Past Medical History:  Diagnosis Date   Arthritis    Atrial fibrillation (HCC) 03/2019   Chronic kidney disease    Dysrhythmia    Hyperlipidemia    Hypertension    Sleep apnea    Stroke (HCC) 03/2019   left sided weakness   Past Surgical History:  Procedure Laterality Date   BACK SURGERY     CATARACT EXTRACTION W/PHACO Left 07/08/2021   Procedure: CATARACT EXTRACTION PHACO AND INTRAOCULAR LENS PLACEMENT (IOC);  Surgeon: Fabio Pierce, MD;  Location: [facility];  Service: Ophthalmology;  Laterality: Left;  CDE: 16.23   CYSTOSCOPY N/A 09/10/2020   Procedure: CYSTOSCOPY;  Surgeon: Malen Gauze, MD;  Location: [facility];  Service: Urology;  Laterality: N/A;   CYSTOSCOPY W/ RETROGRADES Bilateral 08/11/2019   Procedure: CYSTOSCOPY WITH RETROGRADE PYELOGRAM;  Surgeon: Malen Gauze, MD;  Location: [facility];  Service: Urology;  Laterality: Bilateral;   left knee surgery     LOOP RECORDER INSERTION N/A 04/12/2019   Procedure: LOOP RECORDER INSERTION;  Surgeon: Marinus Maw, MD;  [facility]: [facility];  Service:  Cardiovascular;  Laterality: N/A;   TRANSURETHRAL RESECTION OF BLADDER TUMOR Bilateral 08/11/2019   Procedure: TRANSURETHRAL RESECTION OF BLADDER TUMOR (TURBT);  Surgeon: Malen Gauze, MD;  Location: [facility];  Service: Urology;  Laterality: Bilateral;   TRANSURETHRAL RESECTION OF BLADDER TUMOR N/A 09/10/2020   Procedure: TRANSURETHRAL RESECTION OF BLADDER TUMOR (TURBT);  Surgeon: Malen Gauze, MD;  Location: [facility];  Service: Urology;  Laterality: N/A;   Patient Active Problem List   Diagnosis Date Noted   Septic shock (HCC) 10/17/2022   Pressure injury of skin 10/17/2022   Acute-on-chronic kidney injury (HCC) 10/17/2022   Cellulitis 10/17/2022   Lesion of bladder 11/16/2019   PAF (paroxysmal atrial fibrillation) (HCC) 05/05/2019   Effusion of left elbow 04/12/2019   Chronic left shoulder pain 04/12/2019   Rt Occipital Lobe Stroke with Lt hemiparesis 04/09/2019   Dysphagia following cerebrovascular accident 04/09/2019   Speech disturbance due to Acute CVA 04/09/2019   Tobacco abuse-Chews Tobacco 04/09/2019   Paresthesia    Hypocalcemia 04/03/2019   Prolonged QT interval 04/03/2019   HTN (hypertension) 04/03/2019   HLD (hyperlipidemia) 04/03/2019   Weakness 04/03/2019   Per chart review:  Admit date: 10/17/2022   Discharge date: 10/20/2022 Curtis Jenkins is a 87 y.o. male with medical history significant of hypertension, stage III chronic kidney disease, atrial fibrillation on  anticoagulation, hyperlipidemia, history of stroke with residual left-sided weakness.  Patient presents with fever, swelling of his left leg with erythema and pain.  With him starting fevers last week, he was started on Paxlovid to cover COVID, then was given doxycycline, then clindamycin for cellulitis.  As his symptoms were not improving, his daughter called EMS who brought him here for evaluation.  When EMS arrived, he was hypotensive.  They gave him some IV fluids with some improvement of his  hypotension.  His cellulitis has markedly improved with the use of vancomycin and cefepime and his MRSA nares was negative.  No growth on blood cultures noted.  MRI of the lower extremity had significant motion artifact, however did not appear to demonstrate any findings of osteomyelitis.  Additionally his ABI studies indicate mild PAD.  He appears to be much improved and now in stable condition for discharge on oral antibiotics with doxycycline and Keflex as prescribed.  No other acute events or concerns noted throughout the course of this admission.  ONSET DATE: 10/20/22  REFERRING DIAG: necrotic decubitus of heel  THERAPY DIAG:  Pain in left foot  Pressure ulcer of left heel, stage 3 (HCC)  Rationale for Evaluation and Treatment: Rehabilitation  Subjective from evaluation:  Information is obtained via daughter. PT was hospitalized for cellulitis of his Lt Leg. While he was in the hospital he developed a wound on his left heel. He has been discharged with [facility] nursing to care for his heel, however, the [facility] nurse felt that the pt needed more extensive care and recommended OP PT for debridement.     Wound Therapy - 12/18/22 0001     Subjective PT states that the dressing was fine    Patient and Family Stated Goals wound to heal    Date of Onset 11/10/22    Pain Scale 0-10    Pain Score 2     Evaluation and Treatment Procedures Explained to Patient/Family Yes    Evaluation and Treatment Procedures agreed to    Wound Properties Date First Assessed: 11/14/22 Time First Assessed: 1200 Wound Type: Other (Comment) , pressure  Location: Heel Location Orientation: Left Wound Description (Comments): once black eschar has been removed this is the most lateral aspect Present on Admission: Yes   Dressing Type Gauze (Comment)   vaseline/lotion perimeter, medihoney, 2x2, kerlix, extra cotton for shape, coban with netting.   Dressing Changed Changed    Dressing Status Old drainage;Clean, Dry, Intact     Dressing Change Frequency PRN    Site / Wound Assessment Brown;Yellow;Red    % Wound base Red or Granulating 80%   was 0   % Wound base Yellow/Fibrinous Exudate 20%    Peri-wound Assessment Edema;Induration;Erythema (blanchable)    Wound Length (cm) 0.7 cm   was 1.5   Wound Width (cm) 1 cm   was 2.3   Wound Surface Area (cm^2) 0.7 cm^2    Drainage Amount None    Drainage Description Serous    Treatment Cleansed;Debridement (Selective)    Wound Properties Date First Assessed: 11/14/22 Time First Assessed: 1210 Wound Type: Other (Comment) , pressure  Location: Heel Location Orientation: Left Wound Description (Comments): medial wound Present on Admission: Yes   Dressing Type Gauze (Comment)   vaseline/lotion perimeter, medihoney, 2x2, kerlix, extra cotton for shape, coban with netting.   Dressing Status Old drainage;Clean, Dry, Intact    Dressing Change Frequency PRN    Site / Wound Assessment Granulation tissue;Yellow    % Wound  base Red or Granulating 40%   was 0   % Wound base Yellow/Fibrinous Exudate 60%    Peri-wound Assessment Edema    Drainage Amount Scant    Drainage Description Serous    Treatment Cleansed;Debridement (Selective)    Selective Debridement (non-excisional) - Location wound bed    Selective Debridement (non-excisional) - Tools Used Forceps;Scalpel;Scissors    Selective Debridement (non-excisional) - Tissue Removed eschar and other devitalized tissues.    Wound Therapy - Clinical Statement New dressing worked well to decrease edema in foot.    Wound Therapy - Functional Problem List difficulty bathing, wearing a shoe,    Factors Delaying/Impairing Wound Healing Altered sensation;Infection - systemic/local;Immobility;Multiple medical problems;Polypharmacy;Vascular compromise    Hydrotherapy Plan Debridement;Dressing change;Patient/family education    Wound Therapy - Frequency 2X / week    Wound Therapy - Current Recommendations PT    Wound Plan control edema,  debridement and dressing change.    Dressing  medihoney, 4x4, kerlix, 1/2" foam for forefoot and ankles.  Cut out 1/2 " foam for heel to decrease pressure on heel.  extra cotton, coban, netting    Manual Therapy decongestive techniques completed to decrease edema in foot and lower leg.               PATIENT EDUCATION: Education details: complete ankle pumps, increase protein intake  Person educated: Patient Education method: Explanation Education comprehension: verbalized understanding   HOME EXERCISE PROGRAM: Ankle pumps    GOALS: Goals reviewed with patient? No  SHORT TERM GOALS: Target date: 12/12/22  Pt wounds to be 100% granulated Baseline: Goal status: IN PROGRESS  2.  Pt pain to be no greater than a 3 to allow pt to assist with transfers once again.  Baseline:  Goal status: MET    LONG TERM GOALS: Target date: 01/09/23  Pt wounds to be healed to decrease risk of recurrent cellulitis  Baseline:  Goal status: IN PROGRESS  2.  Pt to voice no pain to allow pt to be completing at least 50% of transferring  Baseline:  Goal status: IN PROGRESS  ASSESSMENT:  CLINICAL IMPRESSION:  Wounds continue to improve.  It is very difficult for family to bring pt to treatment.  Therapist believes wounds are at a point [facility] nursing can take care of them.  Family agree and will call MD today  OBJECTIVE IMPAIRMENTS: decreased mobility, difficulty walking, decreased ROM, decreased strength, increased edema, pain, and decreased skin integrity .   ACTIVITY LIMITATIONS: bathing, dressing, and mobility    PERSONAL FACTORS: Age, Fitness, and Past/current experiences are also affecting patient's functional outcome.   REHAB POTENTIAL: Good  CLINICAL DECISION MAKING: Evolving/moderate complexity  EVALUATION COMPLEXITY: Moderate  PLAN: PT FREQUENCY: 2x/week  PT DURATION: 8 weeks  PLANNED INTERVENTIONS: 97110-Therapeutic exercises, 97535- Self Care, 16109- Manual therapy,  and debridement with dressing change.   PLAN FOR NEXT SESSION: continue with debridement and appropriate dressing changes until [facility] nursing is available for care. Virgina Organ, PT CLT [phone]  12/18/2022, 9:44 AM

## 2022-12-23 ENCOUNTER — Ambulatory Visit (HOSPITAL_COMMUNITY): Payer: Medicare Other | Admitting: Physical Therapy

## 2022-12-23 DIAGNOSIS — L89623 Pressure ulcer of left heel, stage 3: Secondary | ICD-10-CM

## 2022-12-23 DIAGNOSIS — M79672 Pain in left foot: Secondary | ICD-10-CM

## 2022-12-23 NOTE — Therapy (Signed)
OUTPATIENT PHYSICAL THERAPY WOUNDCARE   Patient Name: Curtis Jenkins MRN: [id number] DOB:April 26, 1935, 87 y.o., male Today's Date: 12/23/2022  PCP: Elfredia Nevins, MD REFERRING PROVIDER: Elfredia Nevins, MD  END OF SESSION:  PT End of Session - 12/23/22 0954     Visit Number 11    Number of Visits 16    Date for PT Re-Evaluation 01/13/23    Authorization Type approved 16 visits from 11/1 - 12/27    Authorization - Number of Visits 16    Progress Note Due on Visit 16    PT Start Time 0805    PT Stop Time 0845    PT Time Calculation (min) 40 min    Activity Tolerance Patient tolerated treatment well    Behavior During Therapy [facility] for tasks assessed/performed              Past Medical History:  Diagnosis Date   Arthritis    Atrial fibrillation (HCC) 03/2019   Chronic kidney disease    Dysrhythmia    Hyperlipidemia    Hypertension    Sleep apnea    Stroke (HCC) 03/2019   left sided weakness   Past Surgical History:  Procedure Laterality Date   BACK SURGERY     CATARACT EXTRACTION W/PHACO Left 07/08/2021   Procedure: CATARACT EXTRACTION PHACO AND INTRAOCULAR LENS PLACEMENT (IOC);  Surgeon: Fabio Pierce, MD;  Location: [facility];  Service: Ophthalmology;  Laterality: Left;  CDE: 16.23   CYSTOSCOPY N/A 09/10/2020   Procedure: CYSTOSCOPY;  Surgeon: Malen Gauze, MD;  Location: [facility];  Service: Urology;  Laterality: N/A;   CYSTOSCOPY W/ RETROGRADES Bilateral 08/11/2019   Procedure: CYSTOSCOPY WITH RETROGRADE PYELOGRAM;  Surgeon: Malen Gauze, MD;  Location: [facility];  Service: Urology;  Laterality: Bilateral;   left knee surgery     LOOP RECORDER INSERTION N/A 04/12/2019   Procedure: LOOP RECORDER INSERTION;  Surgeon: Marinus Maw, MD;  [facility]: [facility];  Service: Cardiovascular;  Laterality: N/A;   TRANSURETHRAL RESECTION OF BLADDER TUMOR Bilateral 08/11/2019   Procedure: TRANSURETHRAL RESECTION OF BLADDER TUMOR (TURBT);  Surgeon: Malen Gauze, MD;  Location: [facility];  Service: Urology;  Laterality: Bilateral;   TRANSURETHRAL RESECTION OF BLADDER TUMOR N/A 09/10/2020   Procedure: TRANSURETHRAL RESECTION OF BLADDER TUMOR (TURBT);  Surgeon: Malen Gauze, MD;  Location: [facility];  Service: Urology;  Laterality: N/A;   Patient Active Problem List   Diagnosis Date Noted   Septic shock (HCC) 10/17/2022   Pressure injury of skin 10/17/2022   Acute-on-chronic kidney injury (HCC) 10/17/2022   Cellulitis 10/17/2022   Lesion of bladder 11/16/2019   PAF (paroxysmal atrial fibrillation) (HCC) 05/05/2019   Effusion of left elbow 04/12/2019   Chronic left shoulder pain 04/12/2019   Rt Occipital Lobe Stroke with Lt hemiparesis 04/09/2019   Dysphagia following cerebrovascular accident 04/09/2019   Speech disturbance due to Acute CVA 04/09/2019   Tobacco abuse-Chews Tobacco 04/09/2019   Paresthesia    Hypocalcemia 04/03/2019   Prolonged QT interval 04/03/2019   HTN (hypertension) 04/03/2019   HLD (hyperlipidemia) 04/03/2019   Weakness 04/03/2019   Per chart review:  Admit date: 10/17/2022   Discharge date: 10/20/2022 from hospital Curtis Jenkins is a 87 y.o. male with medical history significant of hypertension, stage III chronic kidney disease, atrial fibrillation on anticoagulation, hyperlipidemia, history of stroke with residual left-sided weakness.  Patient presents with fever, swelling of his left leg with erythema and pain.  With him starting fevers  last week, he was started on Paxlovid to cover COVID, then was given doxycycline, then clindamycin for cellulitis.  As his symptoms were not improving, his daughter called EMS who brought him here for evaluation.  When EMS arrived, he was hypotensive.  They gave him some IV fluids with some improvement of his hypotension.  His cellulitis has markedly improved with the use of vancomycin and cefepime and his MRSA nares was negative.  No growth on blood cultures noted.  MRI of the  lower extremity had significant motion artifact, however did not appear to demonstrate any findings of osteomyelitis.  Additionally his ABI studies indicate mild PAD.  He appears to be much improved and now in stable condition for discharge on oral antibiotics with doxycycline and Keflex as prescribed.  No other acute events or concerns noted throughout the course of this admission.  ONSET DATE: 10/20/22  REFERRING DIAG: necrotic decubitus of heel  THERAPY DIAG:  Pain in left foot  Pressure ulcer of left heel, stage 3 (HCC)  Rationale for Evaluation and Treatment: Rehabilitation  Subjective from evaluation:  Information is obtained via daughter. PT was hospitalized for cellulitis of his Lt Leg. While he was in the hospital he developed a wound on his left heel. He has been discharged with [facility] nursing to care for his heel, however, the [facility] nurse felt that the pt needed more extensive care and recommended OP PT for debridement.     Wound Therapy - 12/23/22 0955     Subjective dressing comfortable and intact.  No issues; daughter reports they have reached out to home health to take over once debridement is not needed.    Patient and Family Stated Goals wound to heal    Date of Onset 11/10/22    Evaluation and Treatment Procedures Explained to Patient/Family Yes    Evaluation and Treatment Procedures agreed to    Wound Properties Date First Assessed: 11/14/22 Time First Assessed: 1200 Wound Type: Other (Comment) , pressure  Location: Heel Location Orientation: Left Wound Description (Comments): once black eschar has been removed this is the most lateral aspect Present on Admission: Yes   Wound Image Images linked: 1    Dressing Type Gauze (Comment)   vaseline/lotion perimeter, medihoney, 2x2, kerlix, extra cotton for shape, coban with netting.   Dressing Changed Changed    Dressing Status Old drainage;Clean, Dry, Intact    Dressing Change Frequency PRN    Site / Wound Assessment Brown;Yellow;Red     % Wound base Red or Granulating 90%   after debridement   % Wound base Yellow/Fibrinous Exudate 10%    Peri-wound Assessment Edema;Induration;Erythema (blanchable)    Wound Length (cm) 0.8 cm    Wound Width (cm) 0.8 cm    Wound Surface Area (cm^2) 0.64 cm^2    Drainage Amount Scant    Drainage Description Serosanguineous    Treatment Cleansed;Debridement (Selective)    Wound Properties Date First Assessed: 11/14/22 Time First Assessed: 1210 Wound Type: Other (Comment) , pressure  Location: Heel Location Orientation: Left Wound Description (Comments): medial wound Present on Admission: Yes   Dressing Type Gauze (Comment)   vaseline/lotion perimeter, medihoney, 2x2, kerlix, extra cotton for shape, coban with netting.   Dressing Status Old drainage;Clean, Dry, Intact    Dressing Change Frequency PRN    Site / Wound Assessment Granulation tissue;Yellow    % Wound base Red or Granulating 90%    % Wound base Yellow/Fibrinous Exudate 10%    Peri-wound Assessment Edema  Wound Length (cm) 0.8 cm    Wound Width (cm) 0.5 cm    Wound Depth (cm) 0.2 cm    Wound Volume (cm^3) 0.08 cm^3    Wound Surface Area (cm^2) 0.4 cm^2    Drainage Amount Scant    Drainage Description Serous    Treatment Cleansed;Debridement (Selective)    Selective Debridement (non-excisional) - Location wound bed    Selective Debridement (non-excisional) - Tools Used Forceps;Scalpel;Scissors    Selective Debridement (non-excisional) - Tissue Removed eschar and other devitalized tissues.    Wound Therapy - Clinical Statement see below    Wound Therapy - Functional Problem List difficulty bathing, wearing a shoe,    Factors Delaying/Impairing Wound Healing Altered sensation;Infection - systemic/local;Immobility;Multiple medical problems;Polypharmacy;Vascular compromise    Hydrotherapy Plan Debridement;Dressing change;Patient/family education    Wound Therapy - Frequency 2X / week    Wound Therapy - Current Recommendations  PT    Wound Plan control edema, debridement and dressing change.    Dressing  medihoney, 4x4, kerlix, 1/2" foam for forefoot and ankles.  Cut out 1/2 " foam for heel to decrease pressure on heel.  extra cotton, coban, netting    Manual Therapy --               PATIENT EDUCATION: Education details: complete ankle pumps, increase protein intake  Person educated: Patient Education method: Explanation Education comprehension: verbalized understanding   HOME EXERCISE PROGRAM: Ankle pumps    GOALS: Goals reviewed with patient? No  SHORT TERM GOALS: Target date: 12/12/22  Pt wounds to be 100% granulated Baseline: Goal status: IN PROGRESS  2.  Pt pain to be no greater than a 3 to allow pt to assist with transfers once again.  Baseline:  Goal status: MET    LONG TERM GOALS: Target date: 01/09/23  Pt wounds to be healed to decrease risk of recurrent cellulitis  Baseline:  Goal status: IN PROGRESS  2.  Pt to voice no pain to allow pt to be completing at least 50% of transferring  Baseline:  Goal status: IN PROGRESS  ASSESSMENT:  CLINICAL IMPRESSION:  Wounds covered with dry covering with minimal exudate on dressing.  Therapist able to cleanse and debride away the dry tissue revealing 2 small openings remaining.  These were photographed and measured revealing more closure and increased granulation as compared to last session.  Continued with medihoney gel on wounds and foam to help displace pressure to area.  Daughter reports they are still awaiting getting scheduled with home health to resume woundcare now that nearly all slough is debrided.     OBJECTIVE IMPAIRMENTS: decreased mobility, difficulty walking, decreased ROM, decreased strength, increased edema, pain, and decreased skin integrity .   ACTIVITY LIMITATIONS: bathing, dressing, and mobility   PERSONAL FACTORS: Age, Fitness, and Past/current experiences are also affecting patient's functional outcome.    REHAB POTENTIAL: Good  CLINICAL DECISION MAKING: Evolving/moderate complexity  EVALUATION COMPLEXITY: Moderate  PLAN: PT FREQUENCY: 2x/week  PT DURATION: 8 weeks  PLANNED INTERVENTIONS: 97110-Therapeutic exercises, 97535- Self Care, 16109- Manual therapy, and debridement with dressing change.   PLAN FOR NEXT SESSION: continue with debridement and appropriate dressing changes until [facility] nursing is available for care.  Lurena Nida, PTA/CLT [facility] Ph: [phone]   12/23/2022, 2:44 PM

## 2022-12-25 NOTE — Progress Notes (Signed)
[facility] Summary Report / Loop Recorder 

## 2022-12-26 ENCOUNTER — Ambulatory Visit (HOSPITAL_COMMUNITY): Payer: Medicare Other

## 2022-12-30 ENCOUNTER — Ambulatory Visit (HOSPITAL_COMMUNITY): Payer: Medicare Other | Admitting: Physical Therapy

## 2022-12-30 DIAGNOSIS — M79672 Pain in left foot: Secondary | ICD-10-CM | POA: Diagnosis not present

## 2022-12-30 DIAGNOSIS — L89623 Pressure ulcer of left heel, stage 3: Secondary | ICD-10-CM | POA: Diagnosis not present

## 2022-12-30 NOTE — Therapy (Signed)
OUTPATIENT PHYSICAL THERAPY WOUNDCARE   Patient Name: Curtis Jenkins MRN: [id number] DOB:1935/11/29, 87 y.o., male Today's Date: 12/30/2022  PCP: Elfredia Nevins, MD REFERRING PROVIDER: Elfredia Nevins, MD  END OF SESSION:  PT End of Session - 12/30/22 0926     Visit Number 12    Number of Visits 16    Date for PT Re-Evaluation 01/13/23    Authorization Type approved 16 visits from 11/1 - 12/27    Authorization - Visit Number 12    Authorization - Number of Visits 16    Progress Note Due on Visit 16    PT Start Time 0805    PT Stop Time 0845    PT Time Calculation (min) 40 min    Activity Tolerance Patient tolerated treatment well    Behavior During Therapy [facility] for tasks assessed/performed              Past Medical History:  Diagnosis Date   Arthritis    Atrial fibrillation (HCC) 03/2019   Chronic kidney disease    Dysrhythmia    Hyperlipidemia    Hypertension    Sleep apnea    Stroke (HCC) 03/2019   left sided weakness   Past Surgical History:  Procedure Laterality Date   BACK SURGERY     CATARACT EXTRACTION W/PHACO Left 07/08/2021   Procedure: CATARACT EXTRACTION PHACO AND INTRAOCULAR LENS PLACEMENT (IOC);  Surgeon: Fabio Pierce, MD;  Location: [facility];  Service: Ophthalmology;  Laterality: Left;  CDE: 16.23   CYSTOSCOPY N/A 09/10/2020   Procedure: CYSTOSCOPY;  Surgeon: Malen Gauze, MD;  Location: [facility];  Service: Urology;  Laterality: N/A;   CYSTOSCOPY W/ RETROGRADES Bilateral 08/11/2019   Procedure: CYSTOSCOPY WITH RETROGRADE PYELOGRAM;  Surgeon: Malen Gauze, MD;  Location: [facility];  Service: Urology;  Laterality: Bilateral;   left knee surgery     LOOP RECORDER INSERTION N/A 04/12/2019   Procedure: LOOP RECORDER INSERTION;  Surgeon: Marinus Maw, MD;  [facility];  Service: Cardiovascular;  Laterality: N/A;   TRANSURETHRAL RESECTION OF BLADDER TUMOR Bilateral 08/11/2019   Procedure: TRANSURETHRAL RESECTION OF BLADDER  TUMOR (TURBT);  Surgeon: Malen Gauze, MD;  Location: [facility];  Service: Urology;  Laterality: Bilateral;   TRANSURETHRAL RESECTION OF BLADDER TUMOR N/A 09/10/2020   Procedure: TRANSURETHRAL RESECTION OF BLADDER TUMOR (TURBT);  Surgeon: Malen Gauze, MD;  Location: [facility];  Service: Urology;  Laterality: N/A;   Patient Active Problem List   Diagnosis Date Noted   Septic shock (HCC) 10/17/2022   Pressure injury of skin 10/17/2022   Acute-on-chronic kidney injury (HCC) 10/17/2022   Cellulitis 10/17/2022   Lesion of bladder 11/16/2019   PAF (paroxysmal atrial fibrillation) (HCC) 05/05/2019   Effusion of left elbow 04/12/2019   Chronic left shoulder pain 04/12/2019   Rt Occipital Lobe Stroke with Lt hemiparesis 04/09/2019   Dysphagia following cerebrovascular accident 04/09/2019   Speech disturbance due to Acute CVA 04/09/2019   Tobacco abuse-Chews Tobacco 04/09/2019   Paresthesia    Hypocalcemia 04/03/2019   Prolonged QT interval 04/03/2019   HTN (hypertension) 04/03/2019   HLD (hyperlipidemia) 04/03/2019   Weakness 04/03/2019   Per chart review:  Admit date: 10/17/2022   Discharge date: 10/20/2022 from hospital Curtis Jenkins is a 87 y.o. male with medical history significant of hypertension, stage III chronic kidney disease, atrial fibrillation on anticoagulation, hyperlipidemia, history of stroke with residual left-sided weakness.  Patient presents with fever, swelling of his left leg with  erythema and pain.  With him starting fevers last week, he was started on Paxlovid to cover COVID, then was given doxycycline, then clindamycin for cellulitis.  As his symptoms were not improving, his daughter called EMS who brought him here for evaluation.  When EMS arrived, he was hypotensive.  They gave him some IV fluids with some improvement of his hypotension.  His cellulitis has markedly improved with the use of vancomycin and cefepime and his MRSA nares was negative.  No growth on  blood cultures noted.  MRI of the lower extremity had significant motion artifact, however did not appear to demonstrate any findings of osteomyelitis.  Additionally his ABI studies indicate mild PAD.  He appears to be much improved and now in stable condition for discharge on oral antibiotics with doxycycline and Keflex as prescribed.  No other acute events or concerns noted throughout the course of this admission.  ONSET DATE: 10/20/22  REFERRING DIAG: necrotic decubitus of heel  THERAPY DIAG:  Pain in left foot  Pressure ulcer of left heel, stage 3 (HCC)  Rationale for Evaluation and Treatment: Rehabilitation  Subjective from evaluation:  Information is obtained via daughter. PT was hospitalized for cellulitis of his Lt Leg. While he was in the hospital he developed a wound on his left heel. He has been discharged with [facility] nursing to care for his heel, however, the [facility] nurse felt that the pt needed more extensive care and recommended OP PT for debridement.     Wound Therapy - 12/30/22 0928     Subjective CG reports still no word from home health.  Reports they have no issues today.    Patient and Family Stated Goals wound to heal    Date of Onset 11/10/22    Pain Scale 0-10    Pain Score 0-No pain   no pain unless pressure applied   Evaluation and Treatment Procedures Explained to Patient/Family Yes    Evaluation and Treatment Procedures agreed to    Wound Properties Date First Assessed: 11/14/22 Time First Assessed: 1200 Wound Type: Other (Comment) , pressure  Location: Heel Location Orientation: Left Wound Description (Comments): once black eschar has been removed this is the most lateral aspect Present on Admission: Yes   Wound Image Images linked: 2    Dressing Type Gauze (Comment)   vaseline/lotion perimeter, medihoney, 2x2, kerlix, extra cotton for shape, coban with netting.   Dressing Changed Changed    Dressing Status Old drainage;Clean, Dry, Intact    Dressing Change Frequency  PRN    Site / Wound Assessment Brown;Yellow;Red    % Wound base Red or Granulating 90%    % Wound base Yellow/Fibrinous Exudate 10%    Peri-wound Assessment Edema;Induration;Erythema (blanchable)    Drainage Amount Scant    Drainage Description Serosanguineous    Treatment Cleansed;Debridement (Selective)    Wound Properties Date First Assessed: 11/14/22 Time First Assessed: 1210 Wound Type: Other (Comment) , pressure  Location: Heel Location Orientation: Left Wound Description (Comments): medial wound Present on Admission: Yes   Dressing Type Gauze (Comment)   vaseline/lotion perimeter, medihoney, 2x2, kerlix, extra cotton for shape, coban with netting.   Dressing Changed Changed    Dressing Status Old drainage;Clean, Dry, Intact    Dressing Change Frequency PRN    Site / Wound Assessment Granulation tissue;Yellow    % Wound base Red or Granulating 90%    % Wound base Yellow/Fibrinous Exudate 10%    Peri-wound Assessment Edema    Drainage Amount Scant  Drainage Description Serosanguineous    Treatment Cleansed;Debridement (Selective)    Selective Debridement (non-excisional) - Location wound bed    Selective Debridement (non-excisional) - Tools Used Forceps;Scalpel;Scissors    Selective Debridement (non-excisional) - Tissue Removed eschar and other devitalized tissues.    Wound Therapy - Clinical Statement see below    Wound Therapy - Functional Problem List difficulty bathing, wearing a shoe,    Factors Delaying/Impairing Wound Healing Altered sensation;Infection - systemic/local;Immobility;Multiple medical problems;Polypharmacy;Vascular compromise    Hydrotherapy Plan Debridement;Dressing change;Patient/family education    Wound Therapy - Frequency 2X / week    Wound Therapy - Current Recommendations PT    Wound Plan control edema, debridement and dressing change.    Dressing  medihoney, 4x4, kerlix,  no foam used today, extra cotton, coban, netting               PATIENT  EDUCATION: Education details: complete ankle pumps, increase protein intake  Person educated: Patient Education method: Explanation Education comprehension: verbalized understanding   HOME EXERCISE PROGRAM: Ankle pumps    GOALS: Goals reviewed with patient? No  SHORT TERM GOALS: Target date: 12/12/22  Pt wounds to be 100% granulated Baseline: Goal status: IN PROGRESS  2.  Pt pain to be no greater than a 3 to allow pt to assist with transfers once again.  Baseline:  Goal status: MET    LONG TERM GOALS: Target date: 01/09/23  Pt wounds to be healed to decrease risk of recurrent cellulitis  Baseline:  Goal status: IN PROGRESS  2.  Pt to voice no pain to allow pt to be completing at least 50% of transferring  Baseline:  Goal status: IN PROGRESS  ASSESSMENT:  CLINICAL IMPRESSION:  Wounds again covered with dry covering that was easily debrided to reveal granulating tissue beneath.  Noted bruising and mushiness lateral heel indicating another pressure wound.  CG shown and area photographed.  Instructed to purchase a multipodus boot for pt that has a "kickstand" to keep LE in neutral and heel floating.  Explained this area would most likely erupt into a full wound and will need skilled care.  Example of multipodus boot found on [employer] and given to CG to order.  Initial wounds continue to granulate and approximate.  Continued with medihoney and compression dressing.   OBJECTIVE IMPAIRMENTS: decreased mobility, difficulty walking, decreased ROM, decreased strength, increased edema, pain, and decreased skin integrity .   ACTIVITY LIMITATIONS: bathing, dressing, and mobility   PERSONAL FACTORS: Age, Fitness, and Past/current experiences are also affecting patient's functional outcome.   REHAB POTENTIAL: Good  CLINICAL DECISION MAKING: Evolving/moderate complexity  EVALUATION COMPLEXITY: Moderate  PLAN: PT FREQUENCY: 2x/week  PT DURATION: 8 weeks  PLANNED  INTERVENTIONS: 97110-Therapeutic exercises, 97535- Self Care, 16109- Manual therapy, and debridement with dressing change.   PLAN FOR NEXT SESSION: continue with debridement and appropriate dressing changes until [facility] nursing is available for care.  Lurena Nida, PTA/CLT [facility] Ph: [phone]   12/30/2022, 9:31 AM

## 2023-01-02 ENCOUNTER — Ambulatory Visit (HOSPITAL_COMMUNITY): Payer: Medicare Other | Admitting: Physical Therapy

## 2023-01-05 ENCOUNTER — Ambulatory Visit: Payer: Medicare Other

## 2023-01-05 DIAGNOSIS — I63531 Cerebral infarction due to unspecified occlusion or stenosis of right posterior cerebral artery: Secondary | ICD-10-CM

## 2023-01-05 LAB — CUP PACEART REMOTE DEVICE CHECK
Date Time Interrogation Session: 20241222230131
Implantable Pulse Generator Implant Date: 20210330

## 2023-01-06 ENCOUNTER — Ambulatory Visit (HOSPITAL_COMMUNITY): Payer: Medicare Other | Admitting: Physical Therapy

## 2023-01-06 DIAGNOSIS — M79672 Pain in left foot: Secondary | ICD-10-CM

## 2023-01-06 DIAGNOSIS — L89623 Pressure ulcer of left heel, stage 3: Secondary | ICD-10-CM

## 2023-01-06 NOTE — Therapy (Signed)
OUTPATIENT PHYSICAL THERAPY WOUNDCARE   Patient Name: Curtis Jenkins MRN: [id number] DOB:02/16/1935, 87 y.o., male Today's Date: 01/06/2023  PCP: Elfredia Nevins, MD REFERRING PROVIDER: Elfredia Nevins, MD  END OF SESSION:  PT End of Session - 01/06/23 0930     Visit Number 13    Number of Visits 16    Date for PT Re-Evaluation 01/13/23    Authorization Type approved 16 visits from 11/1 - 12/27    Authorization - Number of Visits 16    Progress Note Due on Visit 16    PT Start Time 0800    PT Stop Time 0835    PT Time Calculation (min) 35 min    Activity Tolerance Patient tolerated treatment well    Behavior During Therapy [facility] for tasks assessed/performed              Past Medical History:  Diagnosis Date   Arthritis    Atrial fibrillation (HCC) 03/2019   Chronic kidney disease    Dysrhythmia    Hyperlipidemia    Hypertension    Sleep apnea    Stroke (HCC) 03/2019   left sided weakness   Past Surgical History:  Procedure Laterality Date   BACK SURGERY     CATARACT EXTRACTION W/PHACO Left 07/08/2021   Procedure: CATARACT EXTRACTION PHACO AND INTRAOCULAR LENS PLACEMENT (IOC);  Surgeon: Fabio Pierce, MD;  Location: [facility];  Service: Ophthalmology;  Laterality: Left;  CDE: 16.23   CYSTOSCOPY N/A 09/10/2020   Procedure: CYSTOSCOPY;  Surgeon: Malen Gauze, MD;  Location: [facility];  Service: Urology;  Laterality: N/A;   CYSTOSCOPY W/ RETROGRADES Bilateral 08/11/2019   Procedure: CYSTOSCOPY WITH RETROGRADE PYELOGRAM;  Surgeon: Malen Gauze, MD;  Location: [facility];  Service: Urology;  Laterality: Bilateral;   left knee surgery     LOOP RECORDER INSERTION N/A 04/12/2019   Procedure: LOOP RECORDER INSERTION;  Surgeon: Marinus Maw, MD;  Location: [facility];  Service: Cardiovascular;  Laterality: N/A;   TRANSURETHRAL RESECTION OF BLADDER TUMOR Bilateral 08/11/2019   Procedure: TRANSURETHRAL RESECTION OF BLADDER TUMOR (TURBT);  Surgeon: Malen Gauze, MD;  Location: [facility];  Service: Urology;  Laterality: Bilateral;   TRANSURETHRAL RESECTION OF BLADDER TUMOR N/A 09/10/2020   Procedure: TRANSURETHRAL RESECTION OF BLADDER TUMOR (TURBT);  Surgeon: Malen Gauze, MD;  Location: [facility];  Service: Urology;  Laterality: N/A;   Patient Active Problem List   Diagnosis Date Noted   Septic shock (HCC) 10/17/2022   Pressure injury of skin 10/17/2022   Acute-on-chronic kidney injury (HCC) 10/17/2022   Cellulitis 10/17/2022   Lesion of bladder 11/16/2019   PAF (paroxysmal atrial fibrillation) (HCC) 05/05/2019   Effusion of left elbow 04/12/2019   Chronic left shoulder pain 04/12/2019   Rt Occipital Lobe Stroke with Lt hemiparesis 04/09/2019   Dysphagia following cerebrovascular accident 04/09/2019   Speech disturbance due to Acute CVA 04/09/2019   Tobacco abuse-Chews Tobacco 04/09/2019   Paresthesia    Hypocalcemia 04/03/2019   Prolonged QT interval 04/03/2019   HTN (hypertension) 04/03/2019   HLD (hyperlipidemia) 04/03/2019   Weakness 04/03/2019   Per chart review:  Admit date: 10/17/2022   Discharge date: 10/20/2022 from hospital Curtis Jenkins is a 87 y.o. male with medical history significant of hypertension, stage III chronic kidney disease, atrial fibrillation on anticoagulation, hyperlipidemia, history of stroke with residual left-sided weakness.  Patient presents with fever, swelling of his left leg with erythema and pain.  With him starting fevers  last week, he was started on Paxlovid to cover COVID, then was given doxycycline, then clindamycin for cellulitis.  As his symptoms were not improving, his daughter called EMS who brought him here for evaluation.  When EMS arrived, he was hypotensive.  They gave him some IV fluids with some improvement of his hypotension.  His cellulitis has markedly improved with the use of vancomycin and cefepime and his MRSA nares was negative.  No growth on blood cultures noted.  MRI of the  lower extremity had significant motion artifact, however did not appear to demonstrate any findings of osteomyelitis.  Additionally his ABI studies indicate mild PAD.  He appears to be much improved and now in stable condition for discharge on oral antibiotics with doxycycline and Keflex as prescribed.  No other acute events or concerns noted throughout the course of this admission.  ONSET DATE: 10/20/22  REFERRING DIAG: necrotic decubitus of heel  THERAPY DIAG:  Pain in left foot  Pressure ulcer of left heel, stage 3 (HCC)  Rationale for Evaluation and Treatment: Rehabilitation  Subjective from evaluation:  Information is obtained via daughter. PT was hospitalized for cellulitis of his Lt Leg. While he was in the hospital he developed a wound on his left heel. He has been discharged with [facility] nursing to care for his heel, however, the [facility] nurse felt that the pt needed more extensive care and recommended OP PT for debridement.     Wound Therapy - 01/06/23 0930     Subjective pt returns today with CG and daughter; have not heard from [facility] but know the MD sent the consult.  daughter reports they have not ordered the multipodus boot but have been positioning him more.    Patient and Family Stated Goals wound to heal    Date of Onset 11/10/22    Pain Scale 0-10    Pain Score 0-No pain    Evaluation and Treatment Procedures Explained to Patient/Family Yes    Evaluation and Treatment Procedures agreed to    Wound Properties Date First Assessed: 11/14/22 Time First Assessed: 1200 Wound Type: Other (Comment) , pressure  Location: Heel Location Orientation: Left Wound Description (Comments): once black eschar has been removed this is the most lateral aspect Present on Admission: Yes   Wound Image Images linked: 1    Dressing Type Gauze (Comment)   vaseline/lotion perimeter, medihoney, 2x2, kerlix, extra cotton for shape, coban with netting.   Dressing Changed Changed    Dressing Status Old  drainage;Clean, Dry, Intact    Dressing Change Frequency PRN    Site / Wound Assessment Brown;Yellow;Red    % Wound base Red or Granulating 90%    % Wound base Yellow/Fibrinous Exudate 10%    Peri-wound Assessment Edema;Induration;Erythema (blanchable)    Wound Length (cm) 0.8 cm    Wound Width (cm) 0.5 cm    Wound Depth (cm) 0.2 cm    Wound Volume (cm^3) 0.08 cm^3    Wound Surface Area (cm^2) 0.4 cm^2    Drainage Amount Minimal    Drainage Description Serosanguineous    Treatment Cleansed;Debridement (Selective)    Wound Properties Date First Assessed: 11/14/22 Time First Assessed: 1210 Wound Type: Other (Comment) , pressure  Location: Heel Location Orientation: Left Wound Description (Comments): medial wound Present on Admission: Yes   Dressing Type Gauze (Comment)   vaseline/lotion perimeter, medihoney, 2x2, kerlix, extra cotton for shape, coban with netting.   Dressing Changed Changed    Dressing Status Old drainage;Clean, Dry, Intact  Dressing Change Frequency PRN    Site / Wound Assessment Granulation tissue;Yellow    % Wound base Red or Granulating 100%    Peri-wound Assessment Edema    Wound Length (cm) 0.8 cm    Wound Width (cm) 0.5 cm    Wound Depth (cm) 0.2 cm    Wound Volume (cm^3) 0.08 cm^3    Wound Surface Area (cm^2) 0.4 cm^2    Drainage Amount Scant    Drainage Description Serosanguineous    Treatment Cleansed;Debridement (Selective)    Selective Debridement (non-excisional) - Location wound bed    Selective Debridement (non-excisional) - Tools Used Forceps;Scalpel;Scissors    Selective Debridement (non-excisional) - Tissue Removed eschar and other devitalized tissues.    Wound Therapy - Clinical Statement see below    Wound Therapy - Functional Problem List difficulty bathing, wearing a shoe,    Factors Delaying/Impairing Wound Healing Altered sensation;Infection - systemic/local;Immobility;Multiple medical problems;Polypharmacy;Vascular compromise     Hydrotherapy Plan Debridement;Dressing change;Patient/family education    Wound Therapy - Frequency 2X / week    Wound Therapy - Current Recommendations PT    Wound Plan control edema, debridement and dressing change.    Dressing  medihoney, 4x4, kerlix,  no foam used today, extra cotton, coban, netting               PATIENT EDUCATION: Education details: complete ankle pumps, increase protein intake  Person educated: Patient Education method: Explanation Education comprehension: verbalized understanding   HOME EXERCISE PROGRAM: Ankle pumps    GOALS: Goals reviewed with patient? No  SHORT TERM GOALS: Target date: 12/12/22  Pt wounds to be 100% granulated Baseline: Goal status: IN PROGRESS  2.  Pt pain to be no greater than a 3 to allow pt to assist with transfers once again.  Baseline:  Goal status: MET    LONG TERM GOALS: Target date: 01/09/23  Pt wounds to be healed to decrease risk of recurrent cellulitis  Baseline:  Goal status: IN PROGRESS  2.  Pt to voice no pain to allow pt to be completing at least 50% of transferring  Baseline:  Goal status: IN PROGRESS  ASSESSMENT:  CLINICAL IMPRESSION:  Explained to daughter importance of multipodus boot and she plans on getting this ordered for pt.  Lateral heel less mushy, however moderate drainage present from two small areas that have not yet erupted.  Not sure of extend or depth of this new wound as it has not opened.  Original two wounds are healing and granulating nicely overall.  Encouraged pt to call [facility] agency and followy up.   Dressed wounds with medihoney and added extra ABD for new area that is draining more.  Light bruising still present in this area, however not as mushy as was previously.    OBJECTIVE IMPAIRMENTS: decreased mobility, difficulty walking, decreased ROM, decreased strength, increased edema, pain, and decreased skin integrity .   ACTIVITY LIMITATIONS: bathing, dressing, and  mobility   PERSONAL FACTORS: Age, Fitness, and Past/current experiences are also affecting patient's functional outcome.   REHAB POTENTIAL: Good  CLINICAL DECISION MAKING: Evolving/moderate complexity  EVALUATION COMPLEXITY: Moderate  PLAN: PT FREQUENCY: 2x/week  PT DURATION: 8 weeks  PLANNED INTERVENTIONS: 97110-Therapeutic exercises, 97535- Self Care, 16109- Manual therapy, and debridement with dressing change.   PLAN FOR NEXT SESSION: Follow up if pressure boot has been ordered.  continue with debridement and appropriate dressing changes until [facility] nursing is available for care.  Curtis Jenkins, PTA/CLT [facility] [place]  [facility] Ph: [phone]   01/06/2023, 9:33 AM

## 2023-01-09 ENCOUNTER — Ambulatory Visit (HOSPITAL_COMMUNITY): Payer: Medicare Other

## 2023-01-15 ENCOUNTER — Encounter (HOSPITAL_COMMUNITY): Payer: Self-pay

## 2023-01-15 ENCOUNTER — Ambulatory Visit (HOSPITAL_COMMUNITY): Payer: Medicare Other | Attending: Internal Medicine

## 2023-01-15 DIAGNOSIS — M79672 Pain in left foot: Secondary | ICD-10-CM | POA: Insufficient documentation

## 2023-01-15 DIAGNOSIS — S91302S Unspecified open wound, left foot, sequela: Secondary | ICD-10-CM | POA: Diagnosis not present

## 2023-01-15 DIAGNOSIS — L89623 Pressure ulcer of left heel, stage 3: Secondary | ICD-10-CM | POA: Diagnosis not present

## 2023-01-15 NOTE — Therapy (Addendum)
 OUTPATIENT PHYSICAL THERAPY WOUNDCARE/Discharge   Patient Name: Curtis Jenkins MRN: [id number] DOB:Sep 03, 1935, 88 y.o., male Today's Date: 01/15/2023  PCP: Bertell Satterfield, MD REFERRING PROVIDER: Bertell Satterfield, MD PHYSICAL THERAPY DISCHARGE SUMMARY  Visits from Start of Care: 14  Current functional level related to goals / functional outcomes: Wounds are healed that were initially referred for; pt  has 2 more request for evaluation has been made but may be able to be treated with [facility]    Remaining deficits: New wounds have appeared   Education / Equipment: The need to keep pressure off of heel    Patient agrees to discharge. Patient goals were met. Patient is being discharged due to meeting the stated rehab goals.  END OF SESSION:  PT End of Session - 01/15/23 1111     Visit Number 14    Number of Visits 14   Date for PT Re-Evaluation 01/13/23    Authorization Type approved 16 visits from 11/1 - 12/27    Authorization - Visit Number 13    Authorization - Number of Visits 16    Progress Note Due on Visit 16    PT Start Time 0720    PT Stop Time 0805    PT Time Calculation (min) 45 min    Activity Tolerance Patient tolerated treatment well    Behavior During Therapy [facility] for tasks assessed/performed               Past Medical History:  Diagnosis Date   Arthritis    Atrial fibrillation (HCC) 03/2019   Chronic kidney disease    Dysrhythmia    Hyperlipidemia    Hypertension    Sleep apnea    Stroke (HCC) 03/2019   left sided weakness   Past Surgical History:  Procedure Laterality Date   BACK SURGERY     CATARACT EXTRACTION W/PHACO Left 07/08/2021   Procedure: CATARACT EXTRACTION PHACO AND INTRAOCULAR LENS PLACEMENT (IOC);  Surgeon: Harrie Agent, MD;  Location: [facility];  Service: Ophthalmology;  Laterality: Left;  CDE: 16.23   CYSTOSCOPY N/A 09/10/2020   Procedure: CYSTOSCOPY;  Surgeon: Sherrilee Belvie CROME, MD;  Location: [facility];  Service: Urology;   Laterality: N/A;   CYSTOSCOPY W/ RETROGRADES Bilateral 08/11/2019   Procedure: CYSTOSCOPY WITH RETROGRADE PYELOGRAM;  Surgeon: Sherrilee Belvie CROME, MD;  Location: [facility];  Service: Urology;  Laterality: Bilateral;   left knee surgery     LOOP RECORDER INSERTION N/A 04/12/2019   Procedure: LOOP RECORDER INSERTION;  Surgeon: Waddell Danelle ORN, MD;  Location: [facility];  Service: Cardiovascular;  Laterality: N/A;   TRANSURETHRAL RESECTION OF BLADDER TUMOR Bilateral 08/11/2019   Procedure: TRANSURETHRAL RESECTION OF BLADDER TUMOR (TURBT);  Surgeon: Sherrilee Belvie CROME, MD;  Location: [facility];  Service: Urology;  Laterality: Bilateral;   TRANSURETHRAL RESECTION OF BLADDER TUMOR N/A 09/10/2020   Procedure: TRANSURETHRAL RESECTION OF BLADDER TUMOR (TURBT);  Surgeon: Sherrilee Belvie CROME, MD;  Location: [facility];  Service: Urology;  Laterality: N/A;   Patient Active Problem List   Diagnosis Date Noted   Septic shock (HCC) 10/17/2022   Pressure injury of skin 10/17/2022   Acute-on-chronic kidney injury (HCC) 10/17/2022   Cellulitis 10/17/2022   Lesion of bladder 11/16/2019   PAF (paroxysmal atrial fibrillation) (HCC) 05/05/2019   Effusion of left elbow 04/12/2019   Chronic left shoulder pain 04/12/2019   Rt Occipital Lobe Stroke with Lt hemiparesis 04/09/2019   Dysphagia following cerebrovascular accident 04/09/2019   Speech disturbance due to Acute CVA  04/09/2019   Tobacco abuse-Chews Tobacco 04/09/2019   Paresthesia    Hypocalcemia 04/03/2019   Prolonged QT interval 04/03/2019   HTN (hypertension) 04/03/2019   HLD (hyperlipidemia) 04/03/2019   Weakness 04/03/2019   Per chart review:  Admit date: 10/17/2022   Discharge date: 10/20/2022 from hospital Curtis Jenkins is a 88 y.o. male with medical history significant of hypertension, stage III chronic kidney disease, atrial fibrillation on anticoagulation, hyperlipidemia, history of stroke with residual left-sided weakness.  Patient presents  with fever, swelling of his left leg with erythema and pain.  With him starting fevers last week, he was started on Paxlovid to cover COVID, then was given doxycycline , then clindamycin for cellulitis.  As his symptoms were not improving, his daughter called EMS who brought him here for evaluation.  When EMS arrived, he was hypotensive.  They gave him some IV fluids with some improvement of his hypotension.  His cellulitis has markedly improved with the use of vancomycin  and cefepime  and his MRSA nares was negative.  No growth on blood cultures noted.  MRI of the lower extremity had significant motion artifact, however did not appear to demonstrate any findings of osteomyelitis.  Additionally his ABI studies indicate mild PAD.  He appears to be much improved and now in stable condition for discharge on oral antibiotics with doxycycline  and Keflex  as prescribed.  No other acute events or concerns noted throughout the course of this admission.  ONSET DATE: 10/20/22  REFERRING DIAG: necrotic decubitus of heel  THERAPY DIAG:  Pain in left foot  Pressure ulcer of left heel, stage 3 (HCC)  Rationale for Evaluation and Treatment: Rehabilitation  Subjective from evaluation:  Information is obtained via daughter. PT was hospitalized for cellulitis of his Lt Leg. While he was in the hospital he developed a wound on his left heel. He has been discharged with [facility] nursing to care for his heel, however, the [facility] nurse felt that the pt needed more extensive care and recommended OP PT for debridement.     Wound Therapy - 01/15/23 0001     Subjective PT arrived with daughter who reports she has ordered the multipodus boot, waiting on arrival.  Today is pt's birthday.    Patient and Family Stated Goals wound to heal    Date of Onset 11/10/22    Pain Scale 0-10    Pain Score 0-No pain    Evaluation and Treatment Procedures Explained to Patient/Family Yes    Evaluation and Treatment Procedures agreed to    Wound  Properties Date First Assessed: 11/14/22 Time First Assessed: 1200 Wound Type: Other (Comment) , pressure  Location: Heel Location Orientation: Left Wound Description (Comments): once black eschar has been removed this is the most lateral aspect Present on Admission: Yes   Wound Image Images linked: 2   These are new wounds old wounds are healed    Dressing Type Gauze (Comment)   medihoney, 4x4, kerlix, no foam used today, extra cotton, coban, netting   Dressing Changed Changed    Dressing Status Old drainage;Clean, Dry, Intact    Dressing Change Frequency PRN    Site / Wound Assessment Healed for original 2 wounds but now has 2 more    % Wound base Red or Granulating Healed    % Wound base Yellow/Fibrinous Exudate    Peri-wound Assessment Edema;Induration;Erythema (blanchable)    Drainage Amount Minimal    Drainage Description Serosanguineous    Treatment Cleansed;Debridement (Selective)    Wound Properties Date  First Assessed: 11/14/22 Time First Assessed: 1210 Wound Type: Other (Comment) , pressure  Location: Heel Location Orientation: Left Wound Description (Comments): medial wound Present on Admission: no   Dressing Type Gauze (Comment)    Dressing Changed Changed    Dressing Status Old drainage;Clean, Dry, Intact    Dressing Change Frequency PRN    Site / Wound Assessment Granulation tissue;Yellow    % Wound base Red or Granulating 100%    Drainage Amount None    Treatment Cleansed    Selective Debridement (non-excisional) - Location    Selective Debridement (non-excisional) - Tools Used    Selective Debridement (non-excisional) - Tissue Removed    Wound Therapy - Clinical Statement see below    Wound Therapy - Functional Problem List difficulty bathing, wearing a shoe,    Factors Delaying/Impairing Wound Healing Altered sensation;Infection - systemic/local;Immobility;Multiple medical problems;Polypharmacy;Vascular compromise    Hydrotherapy Plan Debridement;Dressing  change;Patient/family education    Wound Therapy - Frequency 2X / week    Wound Therapy - Current Recommendations PT    Wound Plan Discharge from original referral.  Pt family may want continued care for two new recent wounds, however these may be treated by [facility] or OP    Dressing  medihoney, 4x4, kerlix,  foam, abd pad, extra cotton, coban, netting                PATIENT EDUCATION: Education details: complete ankle pumps, increase protein intake  Person educated: Patient Education method: Explanation Education comprehension: verbalized understanding   HOME EXERCISE PROGRAM: Ankle pumps    GOALS: Goals reviewed with patient? No  SHORT TERM GOALS: Target date: 12/12/22  Pt wounds to be 100% granulated Baseline: Goal status: MET  2.  Pt pain to be no greater than a 3 to allow pt to assist with transfers once again.  Baseline:  Goal status: MET    LONG TERM GOALS: Target date: 01/09/23  Pt wounds to be healed to decrease risk of recurrent cellulitis  Baseline:  Goal status: met  2.  Pt to voice no pain to allow pt to be completing at least 50% of transferring  Baseline:  Goal status: IN PROGRESS  ASSESSMENT:  CLINICAL IMPRESSION:  Daughter reports she has ordered multipodus boot and awaiting arrival.  Reports referral has been made for [facility] but has not gotten call or aware when to begin.  Original wounds fully healed.  Pt with 2 new wounds on lateral aspect of ankle.  Lateral heel feels mushy with 2 small areas open and moderate drainage for superior wound that is hypergranulated.  Superior wound W.7x L.7 with unknown depth.  Inferior opening with no drainage measuring at W.4xL .4.   Reviewed importance of pressure relief with verbalized understanding.  Countinued with medihoney and compression with kerlix and coban, added foam to address hypergranulation.  Order sent to MD for new wounds and discussed new wounds with PT for eval next session.  OBJECTIVE IMPAIRMENTS:  decreased mobility, difficulty walking, decreased ROM, decreased strength, increased edema, pain, and decreased skin integrity .   ACTIVITY LIMITATIONS: bathing, dressing, and mobility   PERSONAL FACTORS: Age, Fitness, and Past/current experiences are also affecting patient's functional outcome.   REHAB POTENTIAL: Good  CLINICAL DECISION MAKING: Evolving/moderate complexity  EVALUATION COMPLEXITY: Moderate  PLAN: PT FREQUENCY: 2x/week  PT DURATION: 8 weeks  PLANNED INTERVENTIONS: 97110-Therapeutic exercises, 97535- Self Care, 02859- Manual therapy, and debridement with dressing change.   PLAN FOR NEXT SESSION: Follow up if pressure boot has  been ordered.  continue with debridement and appropriate dressing changes until [facility] nursing is available for care.  Augustin Mclean, LPTA/CLT; CBIS [phone] Montie Metro, PT CLT [phone]  01/15/2023, 11:17 AM [employer] Medicare Auth Request Information  Date of referral: 11/03/22 Referring provider: Jerilynn Carnes Referring diagnosis (ICD 10)? F20.327 Treatment diagnosis (ICD 10)? (if different than referring diagnosis) [phone]  Functional Tool Score: N/A  What was this (referring dx) caused by? Other: wound  Nature of Condition: Chronic (continuous duration > 3 months)   Laterality: Lt  Current Functional Measure Score: Other Wound measurements  Objective measurements identify impairments when they are compared to normal values, the uninvolved extremity, and prior level of function.  [x]  Yes  []  No  Objective assessment of functional ability: Moderate functional limitations   Briefly describe symptoms:  Wound, no reports of pain, does have drainage  How did symptoms start: Pressure wound, not healing  Average pain intensity:  Last 24 hours: no reports of pain  Past week: no reports  How often does the pt experience symptoms? Constantly  How much have the symptoms interfered with usual daily activities? Quite a  bit  How has condition changed since care began at this facility? A little better  In general, how is the patients overall health? Poor

## 2023-01-16 DIAGNOSIS — I63531 Cerebral infarction due to unspecified occlusion or stenosis of right posterior cerebral artery: Secondary | ICD-10-CM | POA: Diagnosis not present

## 2023-01-16 DIAGNOSIS — I69354 Hemiplegia and hemiparesis following cerebral infarction affecting left non-dominant side: Secondary | ICD-10-CM | POA: Diagnosis not present

## 2023-01-16 DIAGNOSIS — Z515 Encounter for palliative care: Secondary | ICD-10-CM | POA: Diagnosis not present

## 2023-01-16 DIAGNOSIS — N184 Chronic kidney disease, stage 4 (severe): Secondary | ICD-10-CM | POA: Diagnosis not present

## 2023-01-21 ENCOUNTER — Telehealth (HOSPITAL_COMMUNITY): Payer: Self-pay

## 2023-01-21 NOTE — Telephone Encounter (Signed)
 Called MD's office concerning order for lateral wound, 2nd attempt. Secretary stated they have received and putting in MDs folder.   Becky Sax, LPTA/CLT; CBIS [phone]  Juel Burrow, PTA 01/21/2023, 3:33 PM

## 2023-01-22 ENCOUNTER — Ambulatory Visit (HOSPITAL_COMMUNITY): Payer: Medicare Other | Admitting: Physical Therapy

## 2023-01-22 ENCOUNTER — Other Ambulatory Visit: Payer: Self-pay

## 2023-01-22 DIAGNOSIS — M79672 Pain in left foot: Secondary | ICD-10-CM | POA: Diagnosis not present

## 2023-01-22 DIAGNOSIS — S91302S Unspecified open wound, left foot, sequela: Secondary | ICD-10-CM | POA: Diagnosis not present

## 2023-01-22 DIAGNOSIS — L89623 Pressure ulcer of left heel, stage 3: Secondary | ICD-10-CM | POA: Diagnosis not present

## 2023-01-22 NOTE — Addendum Note (Signed)
 Addended by: Bella Kennedy on: 01/22/2023 03:25 PM   Modules accepted: Orders

## 2023-01-22 NOTE — Therapy (Signed)
 OUTPATIENT PHYSICAL THERAPY NEURO EVALUATION   Patient Name: Curtis Jenkins MRN: [id number] DOB:[birth year], 88 y.o., male Today's Date: 01/22/2023   PCP: Jerilynn Carnes REFERRING PROVIDER: Jerilynn Carnes  END OF SESSION:  PT End of Session - 01/22/23 1438     Visit Number 1    Number of Visits 8    Date for PT Re-Evaluation 02/21/23    Authorization Type [employer] medicare    PT Start Time 1145    PT Stop Time 1215    PT Time Calculation (min) 30 min    Activity Tolerance Patient tolerated treatment well    Behavior During Therapy [facility] for tasks assessed/performed             Past Medical History:  Diagnosis Date   Arthritis    Atrial fibrillation (HCC) 03/2019   Chronic kidney disease    Dysrhythmia    Hyperlipidemia    Hypertension    Sleep apnea    Stroke (HCC) 03/2019   left sided weakness   Past Surgical History:  Procedure Laterality Date   BACK SURGERY     CATARACT EXTRACTION W/PHACO Left 07/08/2021   Procedure: CATARACT EXTRACTION PHACO AND INTRAOCULAR LENS PLACEMENT (IOC);  Surgeon: Harrie Agent, MD;  Location: [facility];  Service: Ophthalmology;  Laterality: Left;  CDE: 16.23   CYSTOSCOPY N/A 09/10/2020   Procedure: CYSTOSCOPY;  Surgeon: Sherrilee Belvie CROME, MD;  Location: [facility];  Service: Urology;  Laterality: N/A;   CYSTOSCOPY W/ RETROGRADES Bilateral 08/11/2019   Procedure: CYSTOSCOPY WITH RETROGRADE PYELOGRAM;  Surgeon: Sherrilee Belvie CROME, MD;  Location: [facility];  Service: Urology;  Laterality: Bilateral;   left knee surgery     LOOP RECORDER INSERTION N/A 04/12/2019   Procedure: LOOP RECORDER INSERTION;  Surgeon: Waddell Danelle ORN, MD;  Location: [facility];  Service: Cardiovascular;  Laterality: N/A;   TRANSURETHRAL RESECTION OF BLADDER TUMOR Bilateral 08/11/2019   Procedure: TRANSURETHRAL RESECTION OF BLADDER TUMOR (TURBT);  Surgeon: Sherrilee Belvie CROME, MD;  Location: [facility];  Service: Urology;  Laterality: Bilateral;   TRANSURETHRAL RESECTION OF  BLADDER TUMOR N/A 09/10/2020   Procedure: TRANSURETHRAL RESECTION OF BLADDER TUMOR (TURBT);  Surgeon: Sherrilee Belvie CROME, MD;  Location: [facility];  Service: Urology;  Laterality: N/A;   Patient Active Problem List   Diagnosis Date Noted   Septic shock (HCC) 10/17/2022   Pressure injury of skin 10/17/2022   Acute-on-chronic kidney injury (HCC) 10/17/2022   Cellulitis 10/17/2022   Lesion of bladder 11/16/2019   PAF (paroxysmal atrial fibrillation) (HCC) 05/05/2019   Effusion of left elbow 04/12/2019   Chronic left shoulder pain 04/12/2019   Rt Occipital Lobe Stroke with Lt hemiparesis 04/09/2019   Dysphagia following cerebrovascular accident 04/09/2019   Speech disturbance due to Acute CVA 04/09/2019   Tobacco abuse-Chews Tobacco 04/09/2019   Paresthesia    Hypocalcemia 04/03/2019   Prolonged QT interval 04/03/2019   HTN (hypertension) 04/03/2019   HLD (hyperlipidemia) 04/03/2019   Weakness 04/03/2019    ONSET DATE: 01/05/23  REFERRING DIAG: D08.697D  THERAPY DIAG:  M79.672]  S91.302S  Rationale for Evaluation and Treatment: Rehabilitation     Wound Therapy - 01/22/23 0001     Subjective Pt has returned since being discharged from pressure ulcer,pt daughter states that he has developed another wound on the lateral aspect of his left heal    Patient and Family Stated Goals wound to heal    Date of Onset 01/02/23    Pain Scale 0-10  Pain Score 3     Evaluation and Treatment Procedures Explained to Patient/Family Yes    Evaluation and Treatment Procedures agreed to    Wound Properties Date First Assessed: 01/22/23 Time First Assessed: 1150 Location: Heel Location Orientation: Left;Lateral Present on Admission: Yes   Wound Image Images linked: 2    Dressing Type Gauze (Comment)    Dressing Changed Changed    Dressing Status Old drainage    Dressing Change Frequency PRN    Site / Wound Assessment Granulation tissue    % Wound base Red or Granulating 100%    Peri-wound  Assessment Other (Comment)   mushy for 2cm circle around this wound.   Wound Length (cm) 1.2 cm   wound is hypergranulating tissue continues to come out slowly.  If pressure is placed on wound; tissue begins to retreat into opening.   Wound Width (cm) 1.1 cm    Wound Surface Area (cm^2) 1.32 cm^2    Drainage Amount Minimal    Drainage Description Purulent    Treatment Cleansed;Pressure applied;Other (Comment)    Selective Debridement (non-excisional) - Tissue Removed --    Wound Therapy - Clinical Statement see below    Wound Therapy - Functional Problem List difficulty bathing, wearing a shoe,    Factors Delaying/Impairing Wound Healing Altered sensation;Infection - systemic/local;Immobility;Multiple medical problems;Polypharmacy;Vascular compromise    Hydrotherapy Plan Debridement;Dressing change;Patient/family education    Wound Therapy - Frequency 2X / week    Wound Therapy - Current Recommendations PT    Wound Plan control edema, pressure to hypergranulating wound to encourage wound healing    Dressing  silverhydrofiber f/b 2x2 and medipore tape pulled tight, foam on top of this followed by profore lite compression dressing.               PATIENT EDUCATION: Education details: Keep dressing dry, remove if it becomes painful  Person educated: Patient and Child(ren) Education method: Explanation Education comprehension: verbalized understanding   HOME EXERCISE PROGRAM: N/A   GOALS: Goals reviewed with patient? No  SHORT TERM GOALS: Target date: 02/05/23  Wound to no longer be hypergranulated  Baseline: Goal status: INITIAL  2.  Wound to no longer have drainage  Baseline:  Goal status: INITIAL   LONG TERM GOALS: Target date: 02/19/23  Wound to be healed  Baseline:  Goal status: INITIAL    ASSESSMENT:  CLINICAL IMPRESSION: Patient is a 88 y.o. male who was seen today for physical therapy evaluation and treatment for a left heel wound.  This pt is known to  this clinic as he has just been discharged from a healed pressure ulcer on his heal.  This wound is not a pressure ulcer as the tissue is hypergranulated and continues to come out if no pressure is applied.  There is a mushy feel for 2 cm surrounding the wound.  The therapist advised the daughter to obtain a dermatology consult as this is a very unusual wound.  We will use silver hydrofiber and pressure to see if the wound responds.   Mr. Hanf will benefit form skilled PT to create a healing environment and prevent infection of the pt Lt foot.    OBJECTIVE IMPAIRMENTS: increased edema, pain, and decreased skin integrity  .   ACTIVITY LIMITATIONS: bathing and dressing   PERSONAL FACTORS: Age, Past/current experiences, and 1-2 comorbidities: CVA, CKI  are also affecting patient's functional outcome.   REHAB POTENTIAL: Good  CLINICAL DECISION MAKING: Evolving/moderate complexity  EVALUATION COMPLEXITY: Moderate  PLAN: PT  FREQUENCY: 2x/week  PT DURATION: 4 weeks  PLANNED INTERVENTIONS: 97535- Self Care, 02859- Manual therapy, 97597- Wound care (first 20 sq cm), and Patient/Family education  PLAN FOR NEXT SESSION: see above   Montie Metro, PT CLT [phone]  01/22/2023, 2:57 PM  [facility] Medicare Auth Request Information  Date of referral: 01/22/23 Referring provider: Dr. Bertell Referring diagnosis (ICD 10)? S91.302S Treatment diagnosis (ICD 10)? (if different than referring diagnosis) M79.672] , S91.302S  Functional Tool Score: N/A  What was this (referring dx) caused by? N/A  Nature of Condition: Initial Onset (within last 3 months)   Laterality: Lt  Current Functional Measure Score: Other N/A  Objective measurements identify impairments when they are compared to normal values, the uninvolved extremity, and prior level of function.  []  Yes  [x]  No  Objective assessment of functional ability: Minimal functional limitations   Briefly describe symptoms: unusual lateral heel  wound   How did symptoms start: unknown   Average pain intensity:  Last 24 hours: 3  Past week: 3  How often does the pt experience symptoms? Occasionally  How much have the symptoms interfered with usual daily activities? A little bit  How has condition changed since care began at this facility? NA - initial visit  In general, how is the patients overall health? Poor

## 2023-01-29 ENCOUNTER — Ambulatory Visit (HOSPITAL_COMMUNITY): Payer: Medicare Other | Admitting: Physical Therapy

## 2023-01-29 DIAGNOSIS — M79672 Pain in left foot: Secondary | ICD-10-CM | POA: Diagnosis not present

## 2023-01-29 DIAGNOSIS — L89623 Pressure ulcer of left heel, stage 3: Secondary | ICD-10-CM

## 2023-01-29 DIAGNOSIS — S91302S Unspecified open wound, left foot, sequela: Secondary | ICD-10-CM

## 2023-01-29 NOTE — Therapy (Signed)
OUTPATIENT PHYSICAL THERAPY WOUNDCARE TREATMENT  Patient Name: Curtis Jenkins MRN: [id number] DOB:1935-04-29, 88 y.o., male Today's Date: 01/29/2023   PCP: Elfredia Nevins REFERRING PROVIDER: Elfredia Nevins  END OF SESSION:  PT End of Session - 01/29/23 0930     Visit Number 3    Number of Visits 8    Date for PT Re-Evaluation 02/21/23    Authorization Type [employer] medicare    Authorization Time Period 8 approved 1/125-02/19/23    Authorization - Visit Number 3    Authorization - Number of Visits 8    Progress Note Due on Visit 8    PT Start Time 0720    PT Stop Time 0800    PT Time Calculation (min) 40 min    Activity Tolerance Patient tolerated treatment well    Behavior During Therapy [facility] for tasks assessed/performed             Past Medical History:  Diagnosis Date   Arthritis    Atrial fibrillation (HCC) 03/2019   Chronic kidney disease    Dysrhythmia    Hyperlipidemia    Hypertension    Sleep apnea    Stroke (HCC) 03/2019   left sided weakness   Past Surgical History:  Procedure Laterality Date   BACK SURGERY     CATARACT EXTRACTION W/PHACO Left 07/08/2021   Procedure: CATARACT EXTRACTION PHACO AND INTRAOCULAR LENS PLACEMENT (IOC);  Surgeon: Fabio Pierce, MD;  Location: [facility];  Service: Ophthalmology;  Laterality: Left;  CDE: 16.23   CYSTOSCOPY N/A 09/10/2020   Procedure: CYSTOSCOPY;  Surgeon: Malen Gauze, MD;  Location: [facility];  Service: Urology;  Laterality: N/A;   CYSTOSCOPY W/ RETROGRADES Bilateral 08/11/2019   Procedure: CYSTOSCOPY WITH RETROGRADE PYELOGRAM;  Surgeon: Malen Gauze, MD;  Location: [facility];  Service: Urology;  Laterality: Bilateral;   left knee surgery     LOOP RECORDER INSERTION N/A 04/12/2019   Procedure: LOOP RECORDER INSERTION;  Surgeon: Marinus Maw, MD;  [facility];  Service: Cardiovascular;  Laterality: N/A;   TRANSURETHRAL RESECTION OF BLADDER TUMOR Bilateral 08/11/2019   Procedure: TRANSURETHRAL  RESECTION OF BLADDER TUMOR (TURBT);  Surgeon: Malen Gauze, MD;  Location: [facility];  Service: Urology;  Laterality: Bilateral;   TRANSURETHRAL RESECTION OF BLADDER TUMOR N/A 09/10/2020   Procedure: TRANSURETHRAL RESECTION OF BLADDER TUMOR (TURBT);  Surgeon: Malen Gauze, MD;  Location: [facility];  Service: Urology;  Laterality: N/A;   Patient Active Problem List   Diagnosis Date Noted   Septic shock (HCC) 10/17/2022   Pressure injury of skin 10/17/2022   Acute-on-chronic kidney injury (HCC) 10/17/2022   Cellulitis 10/17/2022   Lesion of bladder 11/16/2019   PAF (paroxysmal atrial fibrillation) (HCC) 05/05/2019   Effusion of left elbow 04/12/2019   Chronic left shoulder pain 04/12/2019   Rt Occipital Lobe Stroke with Lt hemiparesis 04/09/2019   Dysphagia following cerebrovascular accident 04/09/2019   Speech disturbance due to Acute CVA 04/09/2019   Tobacco abuse-Chews Tobacco 04/09/2019   Paresthesia    Hypocalcemia 04/03/2019   Prolonged QT interval 04/03/2019   HTN (hypertension) 04/03/2019   HLD (hyperlipidemia) 04/03/2019   Weakness 04/03/2019    ONSET DATE: 01/05/23  REFERRING DIAG: U93.235T  THERAPY DIAG:  M79.672]  S91.302S  Rationale for Evaluation and Treatment: Rehabilitation     Wound Therapy - 01/29/23 1157     Subjective no issues currently.    Patient and Family Stated Goals wound to heal    Date  of Onset 01/02/23    Evaluation and Treatment Procedures Explained to Patient/Family Yes    Evaluation and Treatment Procedures agreed to    Wound Properties Date First Assessed: 01/22/23 Time First Assessed: 1150 Location: Heel Location Orientation: Left;Lateral Present on Admission: Yes   Dressing Type Gauze (Comment)    Dressing Changed Changed    Dressing Status Old drainage    Dressing Change Frequency PRN    Site / Wound Assessment Granulation tissue    % Wound base Red or Granulating 100%    Peri-wound Assessment Other (Comment)   mushy for 2cm  circle around this wound.   Wound Length (cm) 1.1 cm    Wound Width (cm) 1.1 cm    Wound Surface Area (cm^2) 1.21 cm^2    Drainage Amount Moderate    Drainage Description Purulent    Treatment Cleansed;Debridement (Selective)    Wound Therapy - Clinical Statement see below    Wound Therapy - Functional Problem List difficulty bathing, wearing a shoe,    Factors Delaying/Impairing Wound Healing Altered sensation;Infection - systemic/local;Immobility;Multiple medical problems;Polypharmacy;Vascular compromise    Hydrotherapy Plan Debridement;Dressing change;Patient/family education    Wound Therapy - Frequency 2X / week    Wound Therapy - Current Recommendations PT    Wound Plan control edema, pressure to hypergranulating wound to encourage wound healing    Dressing  medihoney, 2X2, calium alginate,  foam, mediipore secured.  1/4 ABD pad over and kerlix, #5 netting                PATIENT EDUCATION: Education details: Keep dressing dry, remove if it becomes painful  Person educated: Patient and Child(ren) Education method: Explanation Education comprehension: verbalized understanding   HOME EXERCISE PROGRAM: N/A   GOALS: Goals reviewed with patient? No  SHORT TERM GOALS: Target date: 02/05/23  Wound to no longer be hypergranulated  Baseline: Goal status: INITIAL  2.  Wound to no longer have drainage  Baseline:  Goal status: INITIAL   LONG TERM GOALS: Target date: 02/19/23  Wound to be healed  Baseline:  Goal status: INITIAL    ASSESSMENT:  CLINICAL IMPRESSION: Patient comes today with daughter and CG.  Daughter reports still has not set up [facility] as there is confusion with the order and one facility being short staffed.  Does have appt with Dermatologist next week.  Compliant with heel lift boot and states it is really helping. Foam helped to flatten hypergranulation, however returned following dressing removal.  Exudate drained from wound with massage of perimeter  at lateral malleoli.  No redness perimeter or odor with drainage, however feels he does need a wound culture and possible antibiotic.  Cleansed well, no debridement needed.  Continued with foam to provide pressure to area.  Instructed daughter to change outer dressing if soaks through.  Did not use coban to knee this session to see if any difficulty with edema.  Mr. Vanslooten will benefit form skilled PT to create a healing environment and prevent infection of the pt Lt foot.    OBJECTIVE IMPAIRMENTS: increased edema, pain, and decreased skin integrity  .   ACTIVITY LIMITATIONS: bathing and dressing   PERSONAL FACTORS: Age, Past/current experiences, and 1-2 comorbidities: CVA, CKI  are also affecting patient's functional outcome.   REHAB POTENTIAL: Good  CLINICAL DECISION MAKING: Evolving/moderate complexity  EVALUATION COMPLEXITY: Moderate  PLAN: PT FREQUENCY: 2x/week  PT DURATION: 4 weeks  PLANNED INTERVENTIONS: 97535- Self Care, 23557- Manual therapy, 97597- Wound care (first 20 sq cm), and  Patient/Family education  PLAN FOR NEXT SESSION: see above    Lurena Nida, PTA/CLT [facility] Ph: [phone]  01/29/2023, 12:00 PM

## 2023-02-04 DIAGNOSIS — L980 Pyogenic granuloma: Secondary | ICD-10-CM | POA: Diagnosis not present

## 2023-02-04 DIAGNOSIS — X32XXXA Exposure to sunlight, initial encounter: Secondary | ICD-10-CM | POA: Diagnosis not present

## 2023-02-04 DIAGNOSIS — E11622 Type 2 diabetes mellitus with other skin ulcer: Secondary | ICD-10-CM | POA: Diagnosis not present

## 2023-02-04 DIAGNOSIS — L570 Actinic keratosis: Secondary | ICD-10-CM | POA: Diagnosis not present

## 2023-02-05 ENCOUNTER — Ambulatory Visit (HOSPITAL_COMMUNITY): Payer: Medicare Other | Admitting: Physical Therapy

## 2023-02-05 DIAGNOSIS — M79672 Pain in left foot: Secondary | ICD-10-CM

## 2023-02-05 DIAGNOSIS — S91302S Unspecified open wound, left foot, sequela: Secondary | ICD-10-CM

## 2023-02-05 DIAGNOSIS — L89623 Pressure ulcer of left heel, stage 3: Secondary | ICD-10-CM

## 2023-02-05 NOTE — Therapy (Signed)
OUTPATIENT PHYSICAL THERAPY WOUNDCARE TREATMENT  Patient Name: Curtis Jenkins MRN: [id number] DOB:Oct 17, 1935, 88 y.o., male Today's Date: 02/05/2023   PCP: Elfredia Nevins REFERRING PROVIDER: Elfredia Nevins  END OF SESSION:  PT End of Session - 02/05/23 0759     Visit Number 4    Number of Visits 8    Date for PT Re-Evaluation 02/21/23    Authorization Type [employer] medicare    Authorization Time Period 8 approved 1/125-02/19/23    Authorization - Visit Number 4    Authorization - Number of Visits 8    Progress Note Due on Visit 8    PT Start Time 0720    PT Stop Time 0755    PT Time Calculation (min) 35 min    Activity Tolerance Patient tolerated treatment well    Behavior During Therapy [facility] for tasks assessed/performed             Past Medical History:  Diagnosis Date   Arthritis    Atrial fibrillation (HCC) 03/2019   Chronic kidney disease    Dysrhythmia    Hyperlipidemia    Hypertension    Sleep apnea    Stroke (HCC) 03/2019   left sided weakness   Past Surgical History:  Procedure Laterality Date   BACK SURGERY     CATARACT EXTRACTION W/PHACO Left 07/08/2021   Procedure: CATARACT EXTRACTION PHACO AND INTRAOCULAR LENS PLACEMENT (IOC);  Surgeon: Fabio Pierce, MD;  Location: [facility];  Service: Ophthalmology;  Laterality: Left;  CDE: 16.23   CYSTOSCOPY N/A 09/10/2020   Procedure: CYSTOSCOPY;  Surgeon: Malen Gauze, MD;  Location: [facility];  Service: Urology;  Laterality: N/A;   CYSTOSCOPY W/ RETROGRADES Bilateral 08/11/2019   Procedure: CYSTOSCOPY WITH RETROGRADE PYELOGRAM;  Surgeon: Malen Gauze, MD;  Location: [facility];  Service: Urology;  Laterality: Bilateral;   left knee surgery     LOOP RECORDER INSERTION N/A 04/12/2019   Procedure: LOOP RECORDER INSERTION;  Surgeon: Marinus Maw, MD;  [facility];  Service: Cardiovascular;  Laterality: N/A;   TRANSURETHRAL RESECTION OF BLADDER TUMOR Bilateral 08/11/2019   Procedure: TRANSURETHRAL  RESECTION OF BLADDER TUMOR (TURBT);  Surgeon: Malen Gauze, MD;  Location: [facility];  Service: Urology;  Laterality: Bilateral;   TRANSURETHRAL RESECTION OF BLADDER TUMOR N/A 09/10/2020   Procedure: TRANSURETHRAL RESECTION OF BLADDER TUMOR (TURBT);  Surgeon: Malen Gauze, MD;  Location: [facility];  Service: Urology;  Laterality: N/A;   Patient Active Problem List   Diagnosis Date Noted   Septic shock (HCC) 10/17/2022   Pressure injury of skin 10/17/2022   Acute-on-chronic kidney injury (HCC) 10/17/2022   Cellulitis 10/17/2022   Lesion of bladder 11/16/2019   PAF (paroxysmal atrial fibrillation) (HCC) 05/05/2019   Effusion of left elbow 04/12/2019   Chronic left shoulder pain 04/12/2019   Rt Occipital Lobe Stroke with Lt hemiparesis 04/09/2019   Dysphagia following cerebrovascular accident 04/09/2019   Speech disturbance due to Acute CVA 04/09/2019   Tobacco abuse-Chews Tobacco 04/09/2019   Paresthesia    Hypocalcemia 04/03/2019   Prolonged QT interval 04/03/2019   HTN (hypertension) 04/03/2019   HLD (hyperlipidemia) 04/03/2019   Weakness 04/03/2019    ONSET DATE: 01/05/23  REFERRING DIAG: N82.956O  THERAPY DIAG:  M79.672]  S91.302S  Rationale for Evaluation and Treatment: Rehabilitation  SUBJECTIVE:  Daughter states they went to the dermatologist yesterday and they applied silver nitrate to his heel and prescribed him Doxycline.  He is to return in 3-4 weeks to  them and did not complete a biopsy or wound culture.  They diagnosed the area as ulceration with pyogenic granuloma.  States they are using the pressure boot but pt was complainign of discomfort at mid calf region where new pressure is being applied from wearing the boot (see media for picture).  There is some redness and a small darkened region present in this area.  Told them to only use the boot at night and may try some padding or cushion in that area as well.      Wound Therapy - 02/05/23 0802     Subjective  no issues currently.    Patient and Family Stated Goals wound to heal    Date of Onset 01/02/23    Pain Scale 0-10    Pain Score 3     Evaluation and Treatment Procedures Explained to Patient/Family Yes    Evaluation and Treatment Procedures agreed to    Wound Properties Date First Assessed: 01/22/23 Time First Assessed: 1150 Location: Heel Location Orientation: Left;Lateral Present on Admission: Yes   Wound Image Images linked: 2    Dressing Type Gauze (Comment)    Dressing Changed Changed    Dressing Status Old drainage    Dressing Change Frequency PRN    Site / Wound Assessment Granulation tissue    % Wound base Red or Granulating 100%    Peri-wound Assessment Other (Comment)   mushy for 2cm circle around this wound.   Wound Length (cm) 2.2 cm    Wound Width (cm) 1.7 cm    Wound Surface Area (cm^2) 3.74 cm^2    Drainage Amount Moderate    Drainage Description Serosanguineous    Treatment Cleansed;Debridement (Selective)    Wound Therapy - Clinical Statement see below    Wound Therapy - Functional Problem List difficulty bathing, wearing a shoe,    Factors Delaying/Impairing Wound Healing Altered sensation;Infection - systemic/local;Immobility;Multiple medical problems;Polypharmacy;Vascular compromise    Hydrotherapy Plan Debridement;Dressing change;Patient/family education    Wound Therapy - Frequency 2X / week    Wound Therapy - Current Recommendations PT    Wound Plan control edema, pressure to hypergranulating wound to encourage wound healing    Dressing  silverhydrofiber, 4X4, ABD, foam, kerlix, coban, #5 netting                PATIENT EDUCATION: Education details: Keep dressing dry, remove if it becomes painful  Person educated: Patient and Child(ren) Education method: Explanation Education comprehension: verbalized understanding   HOME EXERCISE PROGRAM: N/A   GOALS: Goals reviewed with patient? No  SHORT TERM GOALS: Target date: 02/05/23  Wound to no  longer be hypergranulated  Baseline: Goal status: INITIAL  2.  Wound to no longer have drainage  Baseline:  Goal status: INITIAL   LONG TERM GOALS: Target date: 02/19/23  Wound to be healed  Baseline:  Goal status: INITIAL    ASSESSMENT:  CLINICAL IMPRESSION: See above subjective for dermatology information from yesterday.  Wound is larger today and still hyper granulated despite silver nitrate treatment yesterday.  Dripping a serosanguineous exudate.  Cleansed area well, no debridement required as is 100% granulated.  Educated CG/daughter on pressure relief as noted to have signs of pressure at posterior calf (pic in media).  Discuss with evaluating therapist if wound is not improving by next week as should be getting smaller not larger.  Used foam over wound to help approximate as well as return to coban as noted increased edema into foot and ankle today.  Mr. Furner will benefit form skilled PT to create a healing environment and prevent infection of the pt Lt foot.   OBJECTIVE IMPAIRMENTS: increased edema, pain, and decreased skin integrity  .   ACTIVITY LIMITATIONS: bathing and dressing   PERSONAL FACTORS: Age, Past/current experiences, and 1-2 comorbidities: CVA, CKI  are also affecting patient's functional outcome.   REHAB POTENTIAL: Good  CLINICAL DECISION MAKING: Evolving/moderate complexity  EVALUATION COMPLEXITY: Moderate  PLAN: PT FREQUENCY: 2x/week  PT DURATION: 4 weeks  PLANNED INTERVENTIONS: 97535- Self Care, 16109- Manual therapy, 97597- Wound care (first 20 sq cm), and Patient/Family education  PLAN FOR NEXT SESSION: continue with wound care with dressing to promote optimal healing environment.  Monitor for addition pressure areas.  Educate on pressure relief.  Discuss with evaluating therapist if no improvement next week.    Lurena Nida, PTA/CLT [facility] Ph: [phone]  02/05/2023, 12:42 PM

## 2023-02-09 ENCOUNTER — Encounter: Payer: Self-pay | Admitting: Internal Medicine

## 2023-02-09 ENCOUNTER — Ambulatory Visit (INDEPENDENT_AMBULATORY_CARE_PROVIDER_SITE_OTHER): Payer: Medicare Other

## 2023-02-09 DIAGNOSIS — I63531 Cerebral infarction due to unspecified occlusion or stenosis of right posterior cerebral artery: Secondary | ICD-10-CM | POA: Diagnosis not present

## 2023-02-09 LAB — CUP PACEART REMOTE DEVICE CHECK
Date Time Interrogation Session: 20250126230422
Implantable Pulse Generator Implant Date: 20210330

## 2023-02-12 ENCOUNTER — Ambulatory Visit (HOSPITAL_COMMUNITY): Payer: Medicare Other

## 2023-02-12 NOTE — Progress Notes (Signed)
Carelink Summary Report / Loop Recorder

## 2023-02-17 ENCOUNTER — Ambulatory Visit (HOSPITAL_COMMUNITY): Payer: Medicare Other

## 2023-02-18 DIAGNOSIS — I69354 Hemiplegia and hemiparesis following cerebral infarction affecting left non-dominant side: Secondary | ICD-10-CM | POA: Diagnosis not present

## 2023-02-18 DIAGNOSIS — Z515 Encounter for palliative care: Secondary | ICD-10-CM | POA: Diagnosis not present

## 2023-02-18 DIAGNOSIS — I63531 Cerebral infarction due to unspecified occlusion or stenosis of right posterior cerebral artery: Secondary | ICD-10-CM | POA: Diagnosis not present

## 2023-02-19 DIAGNOSIS — I10 Essential (primary) hypertension: Secondary | ICD-10-CM | POA: Diagnosis not present

## 2023-02-19 DIAGNOSIS — L03116 Cellulitis of left lower limb: Secondary | ICD-10-CM | POA: Diagnosis not present

## 2023-02-19 DIAGNOSIS — I6930 Unspecified sequelae of cerebral infarction: Secondary | ICD-10-CM | POA: Diagnosis not present

## 2023-02-19 DIAGNOSIS — G894 Chronic pain syndrome: Secondary | ICD-10-CM | POA: Diagnosis not present

## 2023-02-19 DIAGNOSIS — N184 Chronic kidney disease, stage 4 (severe): Secondary | ICD-10-CM | POA: Diagnosis not present

## 2023-02-19 DIAGNOSIS — E1129 Type 2 diabetes mellitus with other diabetic kidney complication: Secondary | ICD-10-CM | POA: Diagnosis not present

## 2023-02-19 DIAGNOSIS — I129 Hypertensive chronic kidney disease with stage 1 through stage 4 chronic kidney disease, or unspecified chronic kidney disease: Secondary | ICD-10-CM | POA: Diagnosis not present

## 2023-02-25 ENCOUNTER — Ambulatory Visit (HOSPITAL_COMMUNITY): Payer: Medicare Other | Admitting: Physical Therapy

## 2023-03-03 DIAGNOSIS — U071 COVID-19: Secondary | ICD-10-CM | POA: Diagnosis not present

## 2023-03-03 DIAGNOSIS — M1990 Unspecified osteoarthritis, unspecified site: Secondary | ICD-10-CM | POA: Diagnosis not present

## 2023-03-03 DIAGNOSIS — E1122 Type 2 diabetes mellitus with diabetic chronic kidney disease: Secondary | ICD-10-CM | POA: Diagnosis not present

## 2023-03-03 DIAGNOSIS — I129 Hypertensive chronic kidney disease with stage 1 through stage 4 chronic kidney disease, or unspecified chronic kidney disease: Secondary | ICD-10-CM | POA: Diagnosis not present

## 2023-03-03 DIAGNOSIS — E039 Hypothyroidism, unspecified: Secondary | ICD-10-CM | POA: Diagnosis not present

## 2023-03-03 DIAGNOSIS — G894 Chronic pain syndrome: Secondary | ICD-10-CM | POA: Diagnosis not present

## 2023-03-03 DIAGNOSIS — L89893 Pressure ulcer of other site, stage 3: Secondary | ICD-10-CM | POA: Diagnosis not present

## 2023-03-03 DIAGNOSIS — L03116 Cellulitis of left lower limb: Secondary | ICD-10-CM | POA: Diagnosis not present

## 2023-03-03 DIAGNOSIS — L89623 Pressure ulcer of left heel, stage 3: Secondary | ICD-10-CM | POA: Diagnosis not present

## 2023-03-03 DIAGNOSIS — Z7901 Long term (current) use of anticoagulants: Secondary | ICD-10-CM | POA: Diagnosis not present

## 2023-03-03 DIAGNOSIS — Z8673 Personal history of transient ischemic attack (TIA), and cerebral infarction without residual deficits: Secondary | ICD-10-CM | POA: Diagnosis not present

## 2023-03-03 DIAGNOSIS — Z79899 Other long term (current) drug therapy: Secondary | ICD-10-CM | POA: Diagnosis not present

## 2023-03-03 DIAGNOSIS — N184 Chronic kidney disease, stage 4 (severe): Secondary | ICD-10-CM | POA: Diagnosis not present

## 2023-03-03 DIAGNOSIS — I700 Atherosclerosis of aorta: Secondary | ICD-10-CM | POA: Diagnosis not present

## 2023-03-04 ENCOUNTER — Ambulatory Visit (HOSPITAL_COMMUNITY): Payer: Medicare Other | Admitting: Physical Therapy

## 2023-03-05 DIAGNOSIS — I6930 Unspecified sequelae of cerebral infarction: Secondary | ICD-10-CM | POA: Diagnosis not present

## 2023-03-05 DIAGNOSIS — G894 Chronic pain syndrome: Secondary | ICD-10-CM | POA: Diagnosis not present

## 2023-03-05 DIAGNOSIS — E1129 Type 2 diabetes mellitus with other diabetic kidney complication: Secondary | ICD-10-CM | POA: Diagnosis not present

## 2023-03-05 DIAGNOSIS — L03116 Cellulitis of left lower limb: Secondary | ICD-10-CM | POA: Diagnosis not present

## 2023-03-05 DIAGNOSIS — N184 Chronic kidney disease, stage 4 (severe): Secondary | ICD-10-CM | POA: Diagnosis not present

## 2023-03-05 DIAGNOSIS — I129 Hypertensive chronic kidney disease with stage 1 through stage 4 chronic kidney disease, or unspecified chronic kidney disease: Secondary | ICD-10-CM | POA: Diagnosis not present

## 2023-03-10 DIAGNOSIS — L03116 Cellulitis of left lower limb: Secondary | ICD-10-CM | POA: Diagnosis not present

## 2023-03-10 DIAGNOSIS — Z515 Encounter for palliative care: Secondary | ICD-10-CM | POA: Diagnosis not present

## 2023-03-10 DIAGNOSIS — I63531 Cerebral infarction due to unspecified occlusion or stenosis of right posterior cerebral artery: Secondary | ICD-10-CM | POA: Diagnosis not present

## 2023-03-10 DIAGNOSIS — I69354 Hemiplegia and hemiparesis following cerebral infarction affecting left non-dominant side: Secondary | ICD-10-CM | POA: Diagnosis not present

## 2023-03-11 ENCOUNTER — Encounter: Payer: Medicare Other | Admitting: Internal Medicine

## 2023-03-16 ENCOUNTER — Ambulatory Visit (INDEPENDENT_AMBULATORY_CARE_PROVIDER_SITE_OTHER): Payer: Medicare Other

## 2023-03-16 DIAGNOSIS — I63531 Cerebral infarction due to unspecified occlusion or stenosis of right posterior cerebral artery: Secondary | ICD-10-CM

## 2023-03-17 LAB — CUP PACEART REMOTE DEVICE CHECK
Date Time Interrogation Session: 20250302230106
Implantable Pulse Generator Implant Date: 20210330

## 2023-03-18 ENCOUNTER — Encounter: Payer: Self-pay | Admitting: Internal Medicine

## 2023-03-20 DIAGNOSIS — I69354 Hemiplegia and hemiparesis following cerebral infarction affecting left non-dominant side: Secondary | ICD-10-CM | POA: Diagnosis not present

## 2023-03-20 DIAGNOSIS — I63531 Cerebral infarction due to unspecified occlusion or stenosis of right posterior cerebral artery: Secondary | ICD-10-CM | POA: Diagnosis not present

## 2023-03-20 DIAGNOSIS — Z515 Encounter for palliative care: Secondary | ICD-10-CM | POA: Diagnosis not present

## 2023-03-20 NOTE — Progress Notes (Signed)
 [facility] Summary Report / Loop Recorder

## 2023-03-23 DIAGNOSIS — I129 Hypertensive chronic kidney disease with stage 1 through stage 4 chronic kidney disease, or unspecified chronic kidney disease: Secondary | ICD-10-CM | POA: Diagnosis not present

## 2023-03-23 DIAGNOSIS — L89623 Pressure ulcer of left heel, stage 3: Secondary | ICD-10-CM | POA: Diagnosis not present

## 2023-03-23 DIAGNOSIS — L89893 Pressure ulcer of other site, stage 3: Secondary | ICD-10-CM | POA: Diagnosis not present

## 2023-03-23 DIAGNOSIS — L03116 Cellulitis of left lower limb: Secondary | ICD-10-CM | POA: Diagnosis not present

## 2023-04-01 DIAGNOSIS — L89893 Pressure ulcer of other site, stage 3: Secondary | ICD-10-CM | POA: Diagnosis not present

## 2023-04-01 DIAGNOSIS — N184 Chronic kidney disease, stage 4 (severe): Secondary | ICD-10-CM | POA: Diagnosis not present

## 2023-04-01 DIAGNOSIS — E039 Hypothyroidism, unspecified: Secondary | ICD-10-CM | POA: Diagnosis not present

## 2023-04-01 DIAGNOSIS — U071 COVID-19: Secondary | ICD-10-CM | POA: Diagnosis not present

## 2023-04-01 DIAGNOSIS — Z79899 Other long term (current) drug therapy: Secondary | ICD-10-CM | POA: Diagnosis not present

## 2023-04-01 DIAGNOSIS — E1122 Type 2 diabetes mellitus with diabetic chronic kidney disease: Secondary | ICD-10-CM | POA: Diagnosis not present

## 2023-04-01 DIAGNOSIS — I700 Atherosclerosis of aorta: Secondary | ICD-10-CM | POA: Diagnosis not present

## 2023-04-01 DIAGNOSIS — Z7901 Long term (current) use of anticoagulants: Secondary | ICD-10-CM | POA: Diagnosis not present

## 2023-04-01 DIAGNOSIS — M1990 Unspecified osteoarthritis, unspecified site: Secondary | ICD-10-CM | POA: Diagnosis not present

## 2023-04-01 DIAGNOSIS — G894 Chronic pain syndrome: Secondary | ICD-10-CM | POA: Diagnosis not present

## 2023-04-01 DIAGNOSIS — Z8673 Personal history of transient ischemic attack (TIA), and cerebral infarction without residual deficits: Secondary | ICD-10-CM | POA: Diagnosis not present

## 2023-04-01 DIAGNOSIS — I129 Hypertensive chronic kidney disease with stage 1 through stage 4 chronic kidney disease, or unspecified chronic kidney disease: Secondary | ICD-10-CM | POA: Diagnosis not present

## 2023-04-01 DIAGNOSIS — L03116 Cellulitis of left lower limb: Secondary | ICD-10-CM | POA: Diagnosis not present

## 2023-04-01 DIAGNOSIS — L89623 Pressure ulcer of left heel, stage 3: Secondary | ICD-10-CM | POA: Diagnosis not present

## 2023-04-11 DIAGNOSIS — I129 Hypertensive chronic kidney disease with stage 1 through stage 4 chronic kidney disease, or unspecified chronic kidney disease: Secondary | ICD-10-CM | POA: Diagnosis not present

## 2023-04-11 DIAGNOSIS — L89623 Pressure ulcer of left heel, stage 3: Secondary | ICD-10-CM | POA: Diagnosis not present

## 2023-04-11 DIAGNOSIS — Z7901 Long term (current) use of anticoagulants: Secondary | ICD-10-CM | POA: Diagnosis not present

## 2023-04-11 DIAGNOSIS — Z8673 Personal history of transient ischemic attack (TIA), and cerebral infarction without residual deficits: Secondary | ICD-10-CM | POA: Diagnosis not present

## 2023-04-11 DIAGNOSIS — I700 Atherosclerosis of aorta: Secondary | ICD-10-CM | POA: Diagnosis not present

## 2023-04-11 DIAGNOSIS — L89893 Pressure ulcer of other site, stage 3: Secondary | ICD-10-CM | POA: Diagnosis not present

## 2023-04-11 DIAGNOSIS — E1122 Type 2 diabetes mellitus with diabetic chronic kidney disease: Secondary | ICD-10-CM | POA: Diagnosis not present

## 2023-04-11 DIAGNOSIS — U071 COVID-19: Secondary | ICD-10-CM | POA: Diagnosis not present

## 2023-04-11 DIAGNOSIS — Z79899 Other long term (current) drug therapy: Secondary | ICD-10-CM | POA: Diagnosis not present

## 2023-04-11 DIAGNOSIS — M1990 Unspecified osteoarthritis, unspecified site: Secondary | ICD-10-CM | POA: Diagnosis not present

## 2023-04-11 DIAGNOSIS — E039 Hypothyroidism, unspecified: Secondary | ICD-10-CM | POA: Diagnosis not present

## 2023-04-11 DIAGNOSIS — G894 Chronic pain syndrome: Secondary | ICD-10-CM | POA: Diagnosis not present

## 2023-04-11 DIAGNOSIS — L03116 Cellulitis of left lower limb: Secondary | ICD-10-CM | POA: Diagnosis not present

## 2023-04-11 DIAGNOSIS — N184 Chronic kidney disease, stage 4 (severe): Secondary | ICD-10-CM | POA: Diagnosis not present

## 2023-04-14 DIAGNOSIS — Z7901 Long term (current) use of anticoagulants: Secondary | ICD-10-CM | POA: Diagnosis not present

## 2023-04-14 DIAGNOSIS — I129 Hypertensive chronic kidney disease with stage 1 through stage 4 chronic kidney disease, or unspecified chronic kidney disease: Secondary | ICD-10-CM | POA: Diagnosis not present

## 2023-04-14 DIAGNOSIS — E1122 Type 2 diabetes mellitus with diabetic chronic kidney disease: Secondary | ICD-10-CM | POA: Diagnosis not present

## 2023-04-14 DIAGNOSIS — U071 COVID-19: Secondary | ICD-10-CM | POA: Diagnosis not present

## 2023-04-14 DIAGNOSIS — I700 Atherosclerosis of aorta: Secondary | ICD-10-CM | POA: Diagnosis not present

## 2023-04-14 DIAGNOSIS — M1990 Unspecified osteoarthritis, unspecified site: Secondary | ICD-10-CM | POA: Diagnosis not present

## 2023-04-14 DIAGNOSIS — N184 Chronic kidney disease, stage 4 (severe): Secondary | ICD-10-CM | POA: Diagnosis not present

## 2023-04-14 DIAGNOSIS — L03116 Cellulitis of left lower limb: Secondary | ICD-10-CM | POA: Diagnosis not present

## 2023-04-14 DIAGNOSIS — Z79899 Other long term (current) drug therapy: Secondary | ICD-10-CM | POA: Diagnosis not present

## 2023-04-14 DIAGNOSIS — L89623 Pressure ulcer of left heel, stage 3: Secondary | ICD-10-CM | POA: Diagnosis not present

## 2023-04-14 DIAGNOSIS — Z8673 Personal history of transient ischemic attack (TIA), and cerebral infarction without residual deficits: Secondary | ICD-10-CM | POA: Diagnosis not present

## 2023-04-14 DIAGNOSIS — G894 Chronic pain syndrome: Secondary | ICD-10-CM | POA: Diagnosis not present

## 2023-04-14 DIAGNOSIS — E039 Hypothyroidism, unspecified: Secondary | ICD-10-CM | POA: Diagnosis not present

## 2023-04-14 DIAGNOSIS — L89893 Pressure ulcer of other site, stage 3: Secondary | ICD-10-CM | POA: Diagnosis not present

## 2023-04-16 NOTE — Addendum Note (Signed)
 Addended by: Geralyn Flash D on: 04/16/2023 04:51 PM   Modules accepted: Orders

## 2023-04-16 NOTE — Progress Notes (Signed)
 [facility] Summary Report / Loop Recorder

## 2023-04-20 ENCOUNTER — Ambulatory Visit (INDEPENDENT_AMBULATORY_CARE_PROVIDER_SITE_OTHER): Payer: Medicare Other

## 2023-04-20 DIAGNOSIS — I63531 Cerebral infarction due to unspecified occlusion or stenosis of right posterior cerebral artery: Secondary | ICD-10-CM | POA: Diagnosis not present

## 2023-04-21 ENCOUNTER — Encounter: Payer: Self-pay | Admitting: Internal Medicine

## 2023-04-21 LAB — CUP PACEART REMOTE DEVICE CHECK
Date Time Interrogation Session: 20250406230130
Implantable Pulse Generator Implant Date: 20210330

## 2023-04-22 DIAGNOSIS — Z8673 Personal history of transient ischemic attack (TIA), and cerebral infarction without residual deficits: Secondary | ICD-10-CM | POA: Diagnosis not present

## 2023-04-22 DIAGNOSIS — N184 Chronic kidney disease, stage 4 (severe): Secondary | ICD-10-CM | POA: Diagnosis not present

## 2023-04-22 DIAGNOSIS — M1990 Unspecified osteoarthritis, unspecified site: Secondary | ICD-10-CM | POA: Diagnosis not present

## 2023-04-22 DIAGNOSIS — L89893 Pressure ulcer of other site, stage 3: Secondary | ICD-10-CM | POA: Diagnosis not present

## 2023-04-22 DIAGNOSIS — E039 Hypothyroidism, unspecified: Secondary | ICD-10-CM | POA: Diagnosis not present

## 2023-04-22 DIAGNOSIS — E1122 Type 2 diabetes mellitus with diabetic chronic kidney disease: Secondary | ICD-10-CM | POA: Diagnosis not present

## 2023-04-22 DIAGNOSIS — Z7901 Long term (current) use of anticoagulants: Secondary | ICD-10-CM | POA: Diagnosis not present

## 2023-04-22 DIAGNOSIS — G894 Chronic pain syndrome: Secondary | ICD-10-CM | POA: Diagnosis not present

## 2023-04-22 DIAGNOSIS — I129 Hypertensive chronic kidney disease with stage 1 through stage 4 chronic kidney disease, or unspecified chronic kidney disease: Secondary | ICD-10-CM | POA: Diagnosis not present

## 2023-04-22 DIAGNOSIS — Z79899 Other long term (current) drug therapy: Secondary | ICD-10-CM | POA: Diagnosis not present

## 2023-04-22 DIAGNOSIS — L03116 Cellulitis of left lower limb: Secondary | ICD-10-CM | POA: Diagnosis not present

## 2023-04-22 DIAGNOSIS — U071 COVID-19: Secondary | ICD-10-CM | POA: Diagnosis not present

## 2023-04-22 DIAGNOSIS — L89623 Pressure ulcer of left heel, stage 3: Secondary | ICD-10-CM | POA: Diagnosis not present

## 2023-04-22 DIAGNOSIS — I700 Atherosclerosis of aorta: Secondary | ICD-10-CM | POA: Diagnosis not present

## 2023-04-29 DIAGNOSIS — I700 Atherosclerosis of aorta: Secondary | ICD-10-CM | POA: Diagnosis not present

## 2023-04-29 DIAGNOSIS — M1990 Unspecified osteoarthritis, unspecified site: Secondary | ICD-10-CM | POA: Diagnosis not present

## 2023-04-29 DIAGNOSIS — U071 COVID-19: Secondary | ICD-10-CM | POA: Diagnosis not present

## 2023-04-29 DIAGNOSIS — N184 Chronic kidney disease, stage 4 (severe): Secondary | ICD-10-CM | POA: Diagnosis not present

## 2023-04-29 DIAGNOSIS — Z79899 Other long term (current) drug therapy: Secondary | ICD-10-CM | POA: Diagnosis not present

## 2023-04-29 DIAGNOSIS — L03116 Cellulitis of left lower limb: Secondary | ICD-10-CM | POA: Diagnosis not present

## 2023-04-29 DIAGNOSIS — G894 Chronic pain syndrome: Secondary | ICD-10-CM | POA: Diagnosis not present

## 2023-04-29 DIAGNOSIS — E1122 Type 2 diabetes mellitus with diabetic chronic kidney disease: Secondary | ICD-10-CM | POA: Diagnosis not present

## 2023-04-29 DIAGNOSIS — Z8673 Personal history of transient ischemic attack (TIA), and cerebral infarction without residual deficits: Secondary | ICD-10-CM | POA: Diagnosis not present

## 2023-04-29 DIAGNOSIS — L89623 Pressure ulcer of left heel, stage 3: Secondary | ICD-10-CM | POA: Diagnosis not present

## 2023-04-29 DIAGNOSIS — I129 Hypertensive chronic kidney disease with stage 1 through stage 4 chronic kidney disease, or unspecified chronic kidney disease: Secondary | ICD-10-CM | POA: Diagnosis not present

## 2023-04-29 DIAGNOSIS — Z7901 Long term (current) use of anticoagulants: Secondary | ICD-10-CM | POA: Diagnosis not present

## 2023-04-29 DIAGNOSIS — L89893 Pressure ulcer of other site, stage 3: Secondary | ICD-10-CM | POA: Diagnosis not present

## 2023-04-29 DIAGNOSIS — E039 Hypothyroidism, unspecified: Secondary | ICD-10-CM | POA: Diagnosis not present

## 2023-05-25 ENCOUNTER — Ambulatory Visit (INDEPENDENT_AMBULATORY_CARE_PROVIDER_SITE_OTHER): Payer: Medicare Other

## 2023-05-25 DIAGNOSIS — I63531 Cerebral infarction due to unspecified occlusion or stenosis of right posterior cerebral artery: Secondary | ICD-10-CM | POA: Diagnosis not present

## 2023-05-25 LAB — CUP PACEART REMOTE DEVICE CHECK
Date Time Interrogation Session: 20250511233113
Implantable Pulse Generator Implant Date: 20210330

## 2023-05-26 ENCOUNTER — Ambulatory Visit: Payer: Self-pay | Admitting: Internal Medicine

## 2023-05-29 DIAGNOSIS — Z515 Encounter for palliative care: Secondary | ICD-10-CM | POA: Diagnosis not present

## 2023-05-29 DIAGNOSIS — I69354 Hemiplegia and hemiparesis following cerebral infarction affecting left non-dominant side: Secondary | ICD-10-CM | POA: Diagnosis not present

## 2023-05-29 DIAGNOSIS — I63531 Cerebral infarction due to unspecified occlusion or stenosis of right posterior cerebral artery: Secondary | ICD-10-CM | POA: Diagnosis not present

## 2023-06-04 NOTE — Addendum Note (Signed)
 Addended by: Edra Govern D on: 06/04/2023 12:59 PM   Modules accepted: Orders

## 2023-06-04 NOTE — Progress Notes (Signed)
 [facility] Summary Report / Loop Recorder

## 2023-06-15 DIAGNOSIS — L03116 Cellulitis of left lower limb: Secondary | ICD-10-CM | POA: Diagnosis not present

## 2023-06-15 DIAGNOSIS — L89623 Pressure ulcer of left heel, stage 3: Secondary | ICD-10-CM | POA: Diagnosis not present

## 2023-06-15 DIAGNOSIS — L89893 Pressure ulcer of other site, stage 3: Secondary | ICD-10-CM | POA: Diagnosis not present

## 2023-06-15 DIAGNOSIS — I129 Hypertensive chronic kidney disease with stage 1 through stage 4 chronic kidney disease, or unspecified chronic kidney disease: Secondary | ICD-10-CM | POA: Diagnosis not present

## 2023-06-25 ENCOUNTER — Ambulatory Visit (INDEPENDENT_AMBULATORY_CARE_PROVIDER_SITE_OTHER)

## 2023-06-25 DIAGNOSIS — I63531 Cerebral infarction due to unspecified occlusion or stenosis of right posterior cerebral artery: Secondary | ICD-10-CM

## 2023-06-25 LAB — CUP PACEART REMOTE DEVICE CHECK
Date Time Interrogation Session: 20250611230221
Implantable Pulse Generator Implant Date: 20210330

## 2023-06-28 ENCOUNTER — Ambulatory Visit: Payer: Self-pay | Admitting: Internal Medicine

## 2023-07-09 ENCOUNTER — Encounter: Admitting: Internal Medicine

## 2023-07-13 NOTE — Progress Notes (Signed)
 [facility] Summary Report / Loop Recorder

## 2023-07-27 ENCOUNTER — Ambulatory Visit

## 2023-07-27 ENCOUNTER — Ambulatory Visit: Payer: Self-pay | Admitting: Internal Medicine

## 2023-07-27 DIAGNOSIS — I63531 Cerebral infarction due to unspecified occlusion or stenosis of right posterior cerebral artery: Secondary | ICD-10-CM | POA: Diagnosis not present

## 2023-07-27 LAB — CUP PACEART REMOTE DEVICE CHECK
Date Time Interrogation Session: 20250713231356
Implantable Pulse Generator Implant Date: 20210330

## 2023-08-12 DIAGNOSIS — I6930 Unspecified sequelae of cerebral infarction: Secondary | ICD-10-CM | POA: Diagnosis not present

## 2023-08-12 DIAGNOSIS — N184 Chronic kidney disease, stage 4 (severe): Secondary | ICD-10-CM | POA: Diagnosis not present

## 2023-08-12 DIAGNOSIS — E782 Mixed hyperlipidemia: Secondary | ICD-10-CM | POA: Diagnosis not present

## 2023-08-12 DIAGNOSIS — E7849 Other hyperlipidemia: Secondary | ICD-10-CM | POA: Diagnosis not present

## 2023-08-12 DIAGNOSIS — I10 Essential (primary) hypertension: Secondary | ICD-10-CM | POA: Diagnosis not present

## 2023-08-12 DIAGNOSIS — E1129 Type 2 diabetes mellitus with other diabetic kidney complication: Secondary | ICD-10-CM | POA: Diagnosis not present

## 2023-08-12 DIAGNOSIS — E119 Type 2 diabetes mellitus without complications: Secondary | ICD-10-CM | POA: Diagnosis not present

## 2023-08-19 NOTE — Progress Notes (Signed)
 [facility] Summary Report / Loop Recorder

## 2023-08-27 ENCOUNTER — Ambulatory Visit (INDEPENDENT_AMBULATORY_CARE_PROVIDER_SITE_OTHER)

## 2023-08-27 DIAGNOSIS — I63531 Cerebral infarction due to unspecified occlusion or stenosis of right posterior cerebral artery: Secondary | ICD-10-CM | POA: Diagnosis not present

## 2023-08-27 LAB — CUP PACEART REMOTE DEVICE CHECK
Date Time Interrogation Session: 20250813230502
Implantable Pulse Generator Implant Date: 20210330

## 2023-08-30 ENCOUNTER — Ambulatory Visit: Payer: Self-pay | Admitting: Internal Medicine

## 2023-09-04 DIAGNOSIS — I63531 Cerebral infarction due to unspecified occlusion or stenosis of right posterior cerebral artery: Secondary | ICD-10-CM | POA: Diagnosis not present

## 2023-09-04 DIAGNOSIS — Z515 Encounter for palliative care: Secondary | ICD-10-CM | POA: Diagnosis not present

## 2023-09-04 DIAGNOSIS — I69354 Hemiplegia and hemiparesis following cerebral infarction affecting left non-dominant side: Secondary | ICD-10-CM | POA: Diagnosis not present

## 2023-09-07 ENCOUNTER — Ambulatory Visit: Attending: Internal Medicine | Admitting: Internal Medicine

## 2023-09-07 ENCOUNTER — Encounter: Payer: Self-pay | Admitting: Internal Medicine

## 2023-09-07 ENCOUNTER — Other Ambulatory Visit: Payer: Self-pay | Admitting: *Deleted

## 2023-09-07 VITALS — BP 134/73 | HR 79

## 2023-09-07 DIAGNOSIS — I480 Paroxysmal atrial fibrillation: Secondary | ICD-10-CM

## 2023-09-07 MED ORDER — AMLODIPINE BESYLATE 5 MG PO TABS: 5.0000 mg | ORAL_TABLET | Freq: Every day | ORAL | 3 refills | Status: AC

## 2023-09-07 MED ORDER — APIXABAN 2.5 MG PO TABS: 2.5000 mg | ORAL_TABLET | Freq: Two times a day (BID) | ORAL | 5 refills | Status: AC

## 2023-09-07 NOTE — Telephone Encounter (Signed)
 Prescription refill request for Eliquis  received. Indication: PAF Last office visit: 09/07/23  Curtis Birmingham MD Scr: 1.74 on 10/20/22  Epic Age: 88 Weight: 108.9kg  Based on above findings Eliquis  2.5mg  twice daily is the appropriate dose.  Refill approved.

## 2023-09-07 NOTE — Patient Instructions (Signed)

## 2023-09-07 NOTE — Progress Notes (Signed)
 HPI Curtis Jenkins returns today for followup. He is a pleasant 88 yo man with a cryptogenic stroke, s/p ILR insertion who was found to have PAF and placed on Eliquis . He had hematuria and the eliquis  was initially stopped. He then had a urological procedure and eliquis  restarted at a lower dose. He does not have palpitations. He has not had chest pain or sob. He has developed osteo of the heal and has had surgery. No fever or chills.  No Known Allergies   Current Outpatient Medications  Medication Sig Dispense Refill   amLODipine  (NORVASC ) 5 MG tablet Take 5 mg by mouth daily.     apixaban  (ELIQUIS ) 2.5 MG TABS tablet Take 1 tablet (2.5 mg total) by mouth 2 (two) times daily. 60 tablet 1   cetirizine (ZYRTEC) 10 MG tablet Take 10 mg by mouth daily.     Cholecalciferol (VITAMIN D3) 125 MCG (5000 UT) CAPS Take 5,000 Units by mouth daily.     clotrimazole-betamethasone (LOTRISONE) cream Apply 1 application topically 2 (two) times daily as needed (irritation/rash.).      HYDROcodone -acetaminophen  (NORCO) 10-325 MG tablet Take 0.5-1 tablets by mouth 4 (four) times daily as needed for moderate pain.     hydrOXYzine  (ATARAX /VISTARIL ) 25 MG tablet Take 25 mg by mouth at bedtime.     levothyroxine  (SYNTHROID ) 25 MCG tablet Take 25 mcg by mouth daily.     QUEtiapine  (SEROQUEL ) 50 MG tablet Take 50 mg by mouth at bedtime.     rosuvastatin  (CRESTOR ) 20 MG tablet Take 20 mg by mouth every evening.      traMADol  (ULTRAM ) 50 MG tablet Take 1 tablet (50 mg total) by mouth 3 (three) times daily as needed (pain.). (Patient taking differently: Take 50 mg by mouth 2 (two) times daily.) 30 tablet 0   No current facility-administered medications for this visit.     Past Medical History:  Diagnosis Date   Arthritis    Atrial fibrillation (HCC) 03/2019   Chronic kidney disease    Dysrhythmia    Hyperlipidemia    Hypertension    Sleep apnea    Stroke (HCC) 03/2019   left sided weakness    ROS:    All systems reviewed and negative except as noted in the HPI.   Past Surgical History:  Procedure Laterality Date   BACK SURGERY     CATARACT EXTRACTION W/PHACO Left 07/08/2021   Procedure: CATARACT EXTRACTION PHACO AND INTRAOCULAR LENS PLACEMENT (IOC);  Surgeon: Harrie Agent, MD;  Location: [facility];  Service: Ophthalmology;  Laterality: Left;  CDE: 16.23   CYSTOSCOPY N/A 09/10/2020   Procedure: CYSTOSCOPY;  Surgeon: Sherrilee Belvie CROME, MD;  Location: [facility];  Service: Urology;  Laterality: N/A;   CYSTOSCOPY W/ RETROGRADES Bilateral 08/11/2019   Procedure: CYSTOSCOPY WITH RETROGRADE PYELOGRAM;  Surgeon: Sherrilee Belvie CROME, MD;  Location: [facility];  Service: Urology;  Laterality: Bilateral;   left knee surgery     LOOP RECORDER INSERTION N/A 04/12/2019   Procedure: LOOP RECORDER INSERTION;  Surgeon: Waddell Danelle ORN, MD;  Location: [facility];  Service: Cardiovascular;  Laterality: N/A;   TRANSURETHRAL RESECTION OF BLADDER TUMOR Bilateral 08/11/2019   Procedure: TRANSURETHRAL RESECTION OF BLADDER TUMOR (TURBT);  Surgeon: Sherrilee Belvie CROME, MD;  Location: [facility];  Service: Urology;  Laterality: Bilateral;   TRANSURETHRAL RESECTION OF BLADDER TUMOR N/A 09/10/2020   Procedure: TRANSURETHRAL RESECTION OF BLADDER TUMOR (TURBT);  Surgeon: Sherrilee Belvie CROME, MD;  Location: [facility];  Service: Urology;  Laterality: N/A;     No family history on file.   Social History   Socioeconomic History   Marital status: Married    Spouse name: Not on file   Number of children: Not on file   Years of education: Not on file   Highest education level: Not on file  Occupational History   Not on file  Tobacco Use   Smoking status: Never   Smokeless tobacco: Former    Types: Designer, multimedia Use   Vaping status: Never Used  Substance and Sexual Activity   Alcohol use: Not Currently   Drug use: Never   Sexual activity: Not on file  Other Topics Concern   Not on file  Social History Narrative    Not on file   Social Drivers of Health   Financial Resource Strain: Low Risk  (09/03/2021)   Overall Financial Resource Strain (CARDIA)    Difficulty of Paying Living Expenses: Not hard at all  Food Insecurity: No Food Insecurity (10/17/2022)   Hunger Vital Sign    Worried About Running Out of Food in the Last Year: Never true    Ran Out of Food in the Last Year: Never true  Transportation Needs: No Transportation Needs (10/17/2022)   PRAPARE - Administrator, Civil Service (Medical): No    Lack of Transportation (Non-Medical): No  Physical Activity: Not on file  Stress: Not on file  Social Connections: Not on file  Intimate Partner Violence: Not At Risk (10/17/2022)   Humiliation, Afraid, Rape, and Kick questionnaire    Fear of Current or Ex-Partner: No    Emotionally Abused: No    Physically Abused: No    Sexually Abused: No     BP 134/73 (BP Location: Left Arm, Cuff Size: Normal)   Pulse 79   SpO2 98%   Physical Exam:  Well appearing NAD HEENT: Unremarkable Neck:  No JVD, no thyromegally Lymphatics:  No adenopathy Back:  No CVA tenderness Lungs:  Clear HEART:  Regular rate rhythm, no murmurs, no rubs, no clicks Abd:  soft, positive bowel sounds, no organomegally, no rebound, no guarding Ext:  2 plus pulses, no edema, no cyanosis, no clubbing Skin:  No rashes no nodules Neuro:  CN II through XII intact, motor grossly intact  EKG - nsr  Assess/Plan:  PAF - he is asymptomatic and will continue his eliquis . He is out of rhythm almost 4% of the time. 2. Stroke - he has not had additional symptoms. 3. HTN - his bp is well controlled today. We will follow. 4. Dyslipidemia - he will continue his statin therapy.   Danelle Maggie Dworkin,MD

## 2023-09-23 DIAGNOSIS — L308 Other specified dermatitis: Secondary | ICD-10-CM | POA: Diagnosis not present

## 2023-09-23 DIAGNOSIS — C44622 Squamous cell carcinoma of skin of right upper limb, including shoulder: Secondary | ICD-10-CM | POA: Diagnosis not present

## 2023-09-23 DIAGNOSIS — E11622 Type 2 diabetes mellitus with other skin ulcer: Secondary | ICD-10-CM | POA: Diagnosis not present

## 2023-09-28 ENCOUNTER — Ambulatory Visit

## 2023-09-28 DIAGNOSIS — I63531 Cerebral infarction due to unspecified occlusion or stenosis of right posterior cerebral artery: Secondary | ICD-10-CM

## 2023-09-28 LAB — CUP PACEART REMOTE DEVICE CHECK
Date Time Interrogation Session: 20250913230315
Implantable Pulse Generator Implant Date: 20210330

## 2023-10-02 ENCOUNTER — Ambulatory Visit: Payer: Self-pay | Admitting: Internal Medicine

## 2023-10-02 NOTE — Progress Notes (Signed)
 Remote Loop Recorder Transmission

## 2023-10-08 ENCOUNTER — Encounter

## 2023-10-08 NOTE — Progress Notes (Signed)
 Remote Loop Recorder Transmission

## 2023-10-10 DIAGNOSIS — M109 Gout, unspecified: Secondary | ICD-10-CM | POA: Diagnosis not present

## 2023-10-21 NOTE — Progress Notes (Signed)
 Remote Loop Recorder Transmission

## 2023-10-27 ENCOUNTER — Ambulatory Visit (INDEPENDENT_AMBULATORY_CARE_PROVIDER_SITE_OTHER)

## 2023-10-27 DIAGNOSIS — I480 Paroxysmal atrial fibrillation: Secondary | ICD-10-CM

## 2023-10-27 LAB — CUP PACEART REMOTE DEVICE CHECK
Date Time Interrogation Session: 20251013230342
Implantable Pulse Generator Implant Date: 20210330

## 2023-10-28 NOTE — Progress Notes (Signed)
 Remote Loop Recorder Transmission

## 2023-10-29 ENCOUNTER — Encounter

## 2023-10-29 ENCOUNTER — Ambulatory Visit: Payer: Self-pay | Admitting: Internal Medicine

## 2023-11-09 ENCOUNTER — Encounter

## 2023-11-27 ENCOUNTER — Ambulatory Visit

## 2023-11-27 DIAGNOSIS — I480 Paroxysmal atrial fibrillation: Secondary | ICD-10-CM

## 2023-11-27 LAB — CUP PACEART REMOTE DEVICE CHECK
Date Time Interrogation Session: 20251113230529
Implantable Pulse Generator Implant Date: 20210330

## 2023-11-29 ENCOUNTER — Ambulatory Visit: Payer: Self-pay | Admitting: Internal Medicine

## 2023-11-30 ENCOUNTER — Encounter

## 2023-12-01 NOTE — Progress Notes (Signed)
 Remote Loop Recorder Transmission

## 2023-12-07 ENCOUNTER — Ambulatory Visit (INDEPENDENT_AMBULATORY_CARE_PROVIDER_SITE_OTHER): Payer: Self-pay | Admitting: Family Medicine

## 2023-12-07 ENCOUNTER — Encounter: Payer: Self-pay | Admitting: Family Medicine

## 2023-12-07 VITALS — BP 140/78 | HR 69

## 2023-12-07 DIAGNOSIS — E038 Other specified hypothyroidism: Secondary | ICD-10-CM

## 2023-12-07 DIAGNOSIS — E782 Mixed hyperlipidemia: Secondary | ICD-10-CM

## 2023-12-07 DIAGNOSIS — E559 Vitamin D deficiency, unspecified: Secondary | ICD-10-CM

## 2023-12-07 DIAGNOSIS — R202 Paresthesia of skin: Secondary | ICD-10-CM

## 2023-12-07 DIAGNOSIS — R7301 Impaired fasting glucose: Secondary | ICD-10-CM

## 2023-12-07 DIAGNOSIS — I10 Essential (primary) hypertension: Secondary | ICD-10-CM | POA: Diagnosis not present

## 2023-12-07 NOTE — Patient Instructions (Addendum)
 I appreciate the opportunity to provide care to you today!    Follow up:  5 months  Labs: please stop by the lab  during the week to get your blood drawn (CBC, CMP, TSH, Lipid profile, HgA1c, Vit D)  Schedule medicare annual wellness  For a Healthier YOU, I Recommend: Reducing your intake of sugar, sodium, carbohydrates, and saturated fats. Increasing your fiber intake by incorporating more whole grains, fruits, and vegetables into your meals. Setting healthy goals with a focus on lowering your consumption of carbs, sugar, and unhealthy fats. Adding variety to your diet by including a wide range of fruits and vegetables. Cutting back on soda and limiting processed foods as much as possible. Staying active: In addition to taking your weight loss medication, aim for at least 150 minutes of moderate-intensity physical activity each week for optimal results.      Please continue to a heart-healthy diet and increase your physical activities. Try to exercise for at least five days a week.    It was a pleasure to see you and I look forward to continuing to work together on your health and well-being. Please do not hesitate to call the office if you need care or have questions about your care.  In case of emergency, please visit the Emergency Department for urgent care, or contact our clinic at [phone] to schedule an appointment. We're here to help you!   Have a wonderful day and week. With Gratitude, Meade JENEANE Gerlach MSN, FNP-BC, PMHNP-BC

## 2023-12-07 NOTE — Progress Notes (Signed)
 New Patient Office Visit  Subjective:  Patient ID: Curtis Jenkins, male    DOB: March 09, 1935  Age: 88 y.o. MRN: [id number]  CC:  Chief Complaint  Patient presents with   Establish Care    HPI Curtis Jenkins is a 88 y.o. male with past medical history of  HTN, HLP, Paresthesia presents for establishing care. For the details of today's visit, please refer to the assessment and plan.    Past Medical History:  Diagnosis Date   Arthritis    Atrial fibrillation (HCC) 03/2019   Chronic kidney disease    Dysrhythmia    Hyperlipidemia    Hypertension    Sleep apnea    Stroke (HCC) 03/2019   left sided weakness    Past Surgical History:  Procedure Laterality Date   BACK SURGERY     CATARACT EXTRACTION W/PHACO Left 07/08/2021   Procedure: CATARACT EXTRACTION PHACO AND INTRAOCULAR LENS PLACEMENT (IOC);  Surgeon: Harrie Agent, MD;  Location: [facility];  Service: Ophthalmology;  Laterality: Left;  CDE: 16.23   CYSTOSCOPY N/A 09/10/2020   Procedure: CYSTOSCOPY;  Surgeon: Sherrilee Belvie CROME, MD;  Location: [facility];  Service: Urology;  Laterality: N/A;   CYSTOSCOPY W/ RETROGRADES Bilateral 08/11/2019   Procedure: CYSTOSCOPY WITH RETROGRADE PYELOGRAM;  Surgeon: Sherrilee Belvie CROME, MD;  Location: [facility];  Service: Urology;  Laterality: Bilateral;   left knee surgery     LOOP RECORDER INSERTION N/A 04/12/2019   Procedure: LOOP RECORDER INSERTION;  Surgeon: Waddell Danelle ORN, MD;  Location: [facility];  Service: Cardiovascular;  Laterality: N/A;   TRANSURETHRAL RESECTION OF BLADDER TUMOR Bilateral 08/11/2019   Procedure: TRANSURETHRAL RESECTION OF BLADDER TUMOR (TURBT);  Surgeon: Sherrilee Belvie CROME, MD;  Location: [facility];  Service: Urology;  Laterality: Bilateral;   TRANSURETHRAL RESECTION OF BLADDER TUMOR N/A 09/10/2020   Procedure: TRANSURETHRAL RESECTION OF BLADDER TUMOR (TURBT);  Surgeon: Sherrilee Belvie CROME, MD;  Location: [facility];  Service: Urology;  Laterality: N/A;    No family  history on file.  Social History   Socioeconomic History   Marital status: Married    Spouse name: Not on file   Number of children: Not on file   Years of education: Not on file   Highest education level: Not on file  Occupational History   Not on file  Tobacco Use   Smoking status: Never   Smokeless tobacco: Former    Types: Designer, Multimedia Use   Vaping status: Never Used  Substance and Sexual Activity   Alcohol use: Not Currently   Drug use: Never   Sexual activity: Not on file  Other Topics Concern   Not on file  Social History Narrative   Not on file   Social Drivers of Health   Financial Resource Strain: Low Risk  (09/03/2021)   Overall Financial Resource Strain (CARDIA)    Difficulty of Paying Living Expenses: Not hard at all  Food Insecurity: No Food Insecurity (10/17/2022)   Hunger Vital Sign    Worried About Running Out of Food in the Last Year: Never true    Ran Out of Food in the Last Year: Never true  Transportation Needs: No Transportation Needs (10/17/2022)   PRAPARE - Administrator, Civil Service (Medical): No    Lack of Transportation (Non-Medical): No  Physical Activity: Not on file  Stress: Not on file  Social Connections: Not on file  Intimate Partner Violence: Not At Risk (10/17/2022)   Humiliation,  Afraid, Rape, and Kick questionnaire    Fear of Current or Ex-Partner: No    Emotionally Abused: No    Physically Abused: No    Sexually Abused: No    ROS Review of Systems  Constitutional:  Negative for fatigue and fever.  Eyes:  Negative for visual disturbance.  Respiratory:  Negative for chest tightness and shortness of breath.   Cardiovascular:  Negative for chest pain and palpitations.  Neurological:  Negative for dizziness and headaches.    Objective:   Today's Vitals: BP (!) 140/78   Pulse 69   SpO2 95%   Physical Exam HENT:     Head: Normocephalic.     Right Ear: External ear normal.     Left Ear: External ear normal.      Nose: No congestion or rhinorrhea.     Mouth/Throat:     Mouth: Mucous membranes are moist.  Cardiovascular:     Rate and Rhythm: Regular rhythm.     Heart sounds: No murmur heard. Pulmonary:     Effort: No respiratory distress.     Breath sounds: Normal breath sounds.  Neurological:     Mental Status: He is alert.      Assessment & Plan:   Primary hypertension Assessment & Plan: The patient reports stopping his blood pressure medication a year ago due to low BP. He is asymptomatic in the clinic today. Encouraged a low-sodium diet and increased physical activity.    Other specified hypothyroidism Assessment & Plan: Encouraged to start taking synthroid  50 mcg daily The patient is encouraged to follow up if experiencing symptoms of hypothyroidism, despite compliance with the current treatment regimen, such as fatigue, weight gain, constipation, dry skin, cold intolerance, hair loss, or depression.    Paresthesia Assessment & Plan: The patient is wheelchair-bound. She has neuropathy in the feet and arthritis in the joints and hands. She was told she needed a knee replacement years ago and previously received injections but was informed that no further interventions could be offered.  The patient remains wheelchair-bound but is able to stand with assistance.    IFG (impaired fasting glucose) -     Hemoglobin A1c  Vitamin D  deficiency -     VITAMIN D  25 Hydroxy (Vit-D Deficiency, Fractures)  TSH (thyroid -stimulating hormone deficiency) -     TSH + free T4  Mixed hyperlipidemia -     Lipid panel -     CMP14+EGFR -     CBC with Differential/Platelet  Note: This chart has been completed using Engineer, Civil (consulting) software, and while attempts have been made to ensure accuracy, certain words and phrases may not be transcribed as intended.     Follow-up: No follow-ups on file.   Camellia Popescu  Z Bacchus, FNP

## 2023-12-10 ENCOUNTER — Encounter

## 2023-12-13 DIAGNOSIS — E039 Hypothyroidism, unspecified: Secondary | ICD-10-CM | POA: Insufficient documentation

## 2023-12-13 NOTE — Assessment & Plan Note (Signed)
 Encouraged to start taking synthroid  50 mcg daily The patient is encouraged to follow up if experiencing symptoms of hypothyroidism, despite compliance with the current treatment regimen, such as fatigue, weight gain, constipation, dry skin, cold intolerance, hair loss, or depression.

## 2023-12-13 NOTE — Assessment & Plan Note (Signed)
 The patient is wheelchair-bound. She has neuropathy in the feet and arthritis in the joints and hands. She was told she needed a knee replacement years ago and previously received injections but was informed that no further interventions could be offered.  The patient remains wheelchair-bound but is able to stand with assistance.

## 2023-12-13 NOTE — Assessment & Plan Note (Signed)
 The patient reports stopping his blood pressure medication a year ago due to low BP. He is asymptomatic in the clinic today. Encouraged a low-sodium diet and increased physical activity.

## 2023-12-28 ENCOUNTER — Ambulatory Visit

## 2023-12-28 DIAGNOSIS — I480 Paroxysmal atrial fibrillation: Secondary | ICD-10-CM | POA: Diagnosis not present

## 2023-12-29 LAB — CUP PACEART REMOTE DEVICE CHECK
Date Time Interrogation Session: 20251214230756
Implantable Pulse Generator Implant Date: 20210330

## 2024-01-01 NOTE — Progress Notes (Signed)
 Remote Loop Recorder Transmission

## 2024-01-10 ENCOUNTER — Ambulatory Visit: Payer: Self-pay | Admitting: Internal Medicine

## 2024-01-11 ENCOUNTER — Encounter

## 2024-01-12 ENCOUNTER — Other Ambulatory Visit: Payer: Self-pay

## 2024-01-12 MED ORDER — LEVOTHYROXINE SODIUM 25 MCG PO TABS: 25.0000 ug | ORAL_TABLET | Freq: Every day | ORAL | 0 refills | Status: AC

## 2024-01-12 MED ORDER — ROSUVASTATIN CALCIUM 20 MG PO TABS: 20.0000 mg | ORAL_TABLET | Freq: Every evening | ORAL | 0 refills | Status: AC

## 2024-01-13 LAB — CMP14+EGFR
ALT: 7 IU/L (ref 0–44)
AST: 15 IU/L (ref 0–40)
Albumin: 3.7 g/dL (ref 3.7–4.7)
Alkaline Phosphatase: 74 IU/L (ref 48–129)
BUN/Creatinine Ratio: 10 (ref 10–24)
BUN: 27 mg/dL (ref 8–27)
Bilirubin Total: 0.5 mg/dL (ref 0.0–1.2)
CO2: 24 mmol/L (ref 20–29)
Calcium: 8.5 mg/dL — ABNORMAL LOW (ref 8.6–10.2)
Chloride: 103 mmol/L (ref 96–106)
Creatinine, Ser: 2.76 mg/dL — ABNORMAL HIGH (ref 0.76–1.27)
Globulin, Total: 2.6 g/dL (ref 1.5–4.5)
Glucose: 102 mg/dL — ABNORMAL HIGH (ref 70–99)
Potassium: 4.7 mmol/L (ref 3.5–5.2)
Sodium: 141 mmol/L (ref 134–144)
Total Protein: 6.3 g/dL (ref 6.0–8.5)
eGFR: 22 mL/min/1.73 — ABNORMAL LOW

## 2024-01-13 LAB — VITAMIN D 25 HYDROXY (VIT D DEFICIENCY, FRACTURES): Vit D, 25-Hydroxy: 77.2 ng/mL (ref 30.0–100.0)

## 2024-01-13 LAB — CBC WITH DIFFERENTIAL/PLATELET
Basophils Absolute: 0.1 x10E3/uL (ref 0.0–0.2)
Basos: 1 %
EOS (ABSOLUTE): 0.8 x10E3/uL — ABNORMAL HIGH (ref 0.0–0.4)
Eos: 8 %
Hematocrit: 41.8 % (ref 37.5–51.0)
Hemoglobin: 13.2 g/dL (ref 13.0–17.7)
Immature Grans (Abs): 0 x10E3/uL (ref 0.0–0.1)
Immature Granulocytes: 0 %
Lymphocytes Absolute: 3 x10E3/uL (ref 0.7–3.1)
Lymphs: 30 %
MCH: 29.8 pg (ref 26.6–33.0)
MCHC: 31.6 g/dL (ref 31.5–35.7)
MCV: 94 fL (ref 79–97)
Monocytes Absolute: 0.9 x10E3/uL (ref 0.1–0.9)
Monocytes: 9 %
Neutrophils Absolute: 5.2 x10E3/uL (ref 1.4–7.0)
Neutrophils: 52 %
Platelets: 257 x10E3/uL (ref 150–450)
RBC: 4.43 x10E6/uL (ref 4.14–5.80)
RDW: 12.9 % (ref 11.6–15.4)
WBC: 9.9 x10E3/uL (ref 3.4–10.8)

## 2024-01-13 LAB — LIPID PANEL
Chol/HDL Ratio: 2.3 ratio (ref 0.0–5.0)
Cholesterol, Total: 102 mg/dL (ref 100–199)
HDL: 44 mg/dL
LDL Chol Calc (NIH): 38 mg/dL (ref 0–99)
Triglycerides: 108 mg/dL (ref 0–149)
VLDL Cholesterol Cal: 20 mg/dL (ref 5–40)

## 2024-01-13 LAB — TSH+FREE T4
Free T4: 0.98 ng/dL (ref 0.82–1.77)
TSH: 3.94 u[IU]/mL (ref 0.450–4.500)

## 2024-01-13 LAB — HEMOGLOBIN A1C
Est. average glucose Bld gHb Est-mCnc: 120 mg/dL
Hgb A1c MFr Bld: 5.8 % — ABNORMAL HIGH (ref 4.8–5.6)

## 2024-01-23 ENCOUNTER — Ambulatory Visit: Payer: Self-pay | Admitting: Family Medicine

## 2024-01-23 DIAGNOSIS — N184 Chronic kidney disease, stage 4 (severe): Secondary | ICD-10-CM

## 2024-01-28 ENCOUNTER — Ambulatory Visit

## 2024-01-28 DIAGNOSIS — I480 Paroxysmal atrial fibrillation: Secondary | ICD-10-CM

## 2024-01-28 LAB — CUP PACEART REMOTE DEVICE CHECK
Date Time Interrogation Session: 20260114230026
Implantable Pulse Generator Implant Date: 20210330

## 2024-01-31 ENCOUNTER — Ambulatory Visit: Payer: Self-pay | Admitting: Student in an Organized Health Care Education/Training Program

## 2024-02-03 ENCOUNTER — Ambulatory Visit

## 2024-02-03 VITALS — Ht 67.0 in | Wt 225.0 lb

## 2024-02-03 DIAGNOSIS — Z0000 Encounter for general adult medical examination without abnormal findings: Secondary | ICD-10-CM | POA: Diagnosis not present

## 2024-02-03 NOTE — Patient Instructions (Signed)
 Curtis Jenkins,  Thank you for taking the time for your Medicare Wellness Visit. I appreciate your continued commitment to your health goals. Please review the care plan we discussed, and feel free to reach out if I can assist you further.  Please note that Annual Wellness Visits do not include a physical exam. Some assessments may be limited, especially if the visit was conducted virtually. If needed, we may recommend an in-person follow-up with your provider.  Ongoing Care Seeing your primary care provider every 3 to 6 months helps us  monitor your health and provide consistent, personalized care.   Referrals If a referral was made during today's visit and you haven't received any updates within two weeks, please contact the referred provider directly to check on the status.  Recommended Screenings:  Health Maintenance  Topic Date Due   COVID-19 Vaccine (1) Never done   DTaP/Tdap/Td vaccine (1 - Tdap) Never done   Pneumococcal Vaccine for age over 83 (1 of 2 - PCV) Never done   Medicare Annual Wellness Visit  07/11/2022   Flu Shot  04/12/2024*   Zoster (Shingles) Vaccine  Completed   Meningitis B Vaccine  Aged Out  *Topic was postponed. The date shown is not the original due date.       02/03/2024    3:36 PM  Advanced Directives  Does Patient Have a Medical Advance Directive? No  Would patient like information on creating a medical advance directive? No - Patient declined    Vision: Annual vision screenings are recommended for early detection of glaucoma, cataracts, and diabetic retinopathy. These exams can also reveal signs of chronic conditions such as diabetes and high blood pressure.  Dental: Annual dental screenings help detect early signs of oral cancer, gum disease, and other conditions linked to overall health, including heart disease and diabetes.  Please see the attached documents for additional preventive care recommendations.

## 2024-02-03 NOTE — Progress Notes (Signed)
 "  Chief Complaint  Patient presents with   Medicare Wellness     Subjective:   Curtis Jenkins is a 89 y.o. male who presents for a Medicare Annual Wellness Visit.  Visit info / Clinical Intake: Medicare Wellness Visit Type:: Subsequent Annual Wellness Visit Persons participating in visit and providing information:: caregiver (with patient present) Medicare Wellness Visit Mode:: Telephone If telephone:: video declined Since this visit was completed virtually, some vitals may be partially provided or unavailable. Missing vitals are due to the limitations of the virtual format.: Documented vitals are patient reported If Telephone or Video please confirm:: I connected with patient using audio/video enable telemedicine. I verified patient identity with two identifiers, discussed telehealth limitations, and patient agreed to proceed. Patient Location:: home Provider Location:: office Interpreter Needed?: No Pre-visit prep was completed: yes AWV questionnaire completed by patient prior to visit?: no Living arrangements:: lives with spouse/significant other Patient's Overall Health Status Rating: good Typical amount of pain: none Does pain affect daily life?: no Are you currently prescribed opioids?: no  Dietary Habits and Nutritional Risks How many meals a day?: 2 Eats fruit and vegetables daily?: yes Most meals are obtained by: having others provide food In the last 2 weeks, have you had any of the following?: none Diabetic:: no  Functional Status Activities of Daily Living (to include ambulation/medication): (!) Dependent Feeding: Independent Dressing/Grooming: Dependent Bathing: Dependent Toileting: Dependent Transfer: Dependent Ambulation: Dependent Medication Administration: Dependent Is this a change from baseline?: Pre-admission baseline Home Management (perform basic housework or laundry): Dependent Manage your own finances?: (!) no Primary transportation is: family  / friends Concerns about vision?: no *vision screening is required for WT[facility]* Concerns about hearing?: no  Fall Screening Falls in the past year?: 0 Number of falls in past year: 0 Was there an injury with Fall?: 0 Fall Risk Category Calculator: 0 Patient Fall Risk Level: Low Fall Risk  Fall Risk Patient at Risk for Falls Due to: Impaired balance/gait; Impaired mobility; Mental status change Fall risk Follow up: Falls evaluation completed; Education provided; Falls prevention discussed  Home and Transportation Safety: All rugs have non-skid backing?: N/A, no rugs All stairs or steps have railings?: N/A, no stairs Grab bars in the bathtub or shower?: (!) no (patient is bathed by daughter. he is no longer able to get into the bath/shower) Have non-skid surface in bathtub or shower?: yes Good home lighting?: yes Regular seat belt use?: yes Hospital stays in the last year:: no  Cognitive Assessment Difficulty concentrating, remembering, or making decisions? : yes Will 6CIT or Mini Cog be Completed: no 6CIT or Mini Cog Declined: patient has a diagnosis of dementia or cognitive impairment  Advance Directives (For Healthcare) Does Patient Have a Medical Advance Directive?: No Would patient like information on creating a medical advance directive?: No - Patient declined  Reviewed/Updated  Reviewed/Updated: Reviewed All (Medical, Surgical, Family, Medications, Allergies, Care Teams, Patient Goals)    Allergies (verified) Patient has no known allergies.   Current Medications (verified) Outpatient Encounter Medications as of 02/03/2024  Medication Sig   apixaban  (ELIQUIS ) 2.5 MG TABS tablet Take 1 tablet (2.5 mg total) by mouth 2 (two) times daily.   cetirizine (ZYRTEC) 10 MG tablet Take 10 mg by mouth daily.   Cholecalciferol (VITAMIN D3) 125 MCG (5000 UT) CAPS Take 5,000 Units by mouth daily.   HYDROcodone -acetaminophen  (NORCO) 10-325 MG tablet Take 0.5-1 tablets by mouth 4 (four)  times daily as needed for moderate pain.   hydrOXYzine  (ATARAX /VISTARIL )  25 MG tablet Take 25 mg by mouth at bedtime.   levothyroxine  (SYNTHROID ) 25 MCG tablet Take 1 tablet (25 mcg total) by mouth daily.   QUEtiapine  (SEROQUEL ) 50 MG tablet Take 50 mg by mouth at bedtime.   rosuvastatin  (CRESTOR ) 20 MG tablet Take 1 tablet (20 mg total) by mouth every evening.   traMADol  (ULTRAM ) 50 MG tablet Take 1 tablet (50 mg total) by mouth 3 (three) times daily as needed (pain.). (Patient taking differently: Take 50 mg by mouth 2 (two) times daily.)   amLODipine  (NORVASC ) 5 MG tablet Take 1 tablet (5 mg total) by mouth daily. (Patient not taking: Reported on 02/03/2024)   clotrimazole-betamethasone (LOTRISONE) cream Apply 1 application topically 2 (two) times daily as needed (irritation/rash.).  (Patient not taking: Reported on 02/03/2024)   No facility-administered encounter medications on file as of 02/03/2024.    History: Past Medical History:  Diagnosis Date   Arthritis    Atrial fibrillation (HCC) 03/2019   Chronic kidney disease    Dysrhythmia    Hyperlipidemia    Hypertension    Sleep apnea    Stroke (HCC) 03/2019   left sided weakness   Past Surgical History:  Procedure Laterality Date   BACK SURGERY     CATARACT EXTRACTION W/PHACO Left 07/08/2021   Procedure: CATARACT EXTRACTION PHACO AND INTRAOCULAR LENS PLACEMENT (IOC);  Surgeon: Harrie Agent, MD;  Location: [facility];  Service: Ophthalmology;  Laterality: Left;  CDE: 16.23   CYSTOSCOPY N/A 09/10/2020   Procedure: CYSTOSCOPY;  Surgeon: Sherrilee Belvie CROME, MD;  Location: [facility];  Service: Urology;  Laterality: N/A;   CYSTOSCOPY W/ RETROGRADES Bilateral 08/11/2019   Procedure: CYSTOSCOPY WITH RETROGRADE PYELOGRAM;  Surgeon: Sherrilee Belvie CROME, MD;  Location: [facility];  Service: Urology;  Laterality: Bilateral;   left knee surgery     LOOP RECORDER INSERTION N/A 04/12/2019   Procedure: LOOP RECORDER INSERTION;  Surgeon: Waddell Danelle ORN, MD;   Location: [facility];  Service: Cardiovascular;  Laterality: N/A;   TRANSURETHRAL RESECTION OF BLADDER TUMOR Bilateral 08/11/2019   Procedure: TRANSURETHRAL RESECTION OF BLADDER TUMOR (TURBT);  Surgeon: Sherrilee Belvie CROME, MD;  Location: [facility];  Service: Urology;  Laterality: Bilateral;   TRANSURETHRAL RESECTION OF BLADDER TUMOR N/A 09/10/2020   Procedure: TRANSURETHRAL RESECTION OF BLADDER TUMOR (TURBT);  Surgeon: Sherrilee Belvie CROME, MD;  Location: [facility];  Service: Urology;  Laterality: N/A;   No family history on file. Social History   Occupational History   Not on file  Tobacco Use   Smoking status: Never   Smokeless tobacco: Former    Types: Engineer, Drilling   Vaping status: Never Used  Substance and Sexual Activity   Alcohol use: Not Currently   Drug use: Never   Sexual activity: Not on file   Tobacco Counseling Counseling given: Yes  SDOH Screenings   Food Insecurity: No Food Insecurity (02/03/2024)  Housing: Low Risk (02/03/2024)  Transportation Needs: No Transportation Needs (02/03/2024)  Utilities: Not At Risk (02/03/2024)  Depression (PHQ2-9): Low Risk (02/03/2024)  Financial Resource Strain: Low Risk (09/03/2021)  Physical Activity: Patient Unable To Answer (02/03/2024)  Social Connections: Moderately Isolated (02/03/2024)  Stress: Patient Unable To Answer (02/03/2024)  Tobacco Use: Medium Risk (02/03/2024)  Health Literacy: Patient Unable To Answer (02/03/2024)   See flowsheets for full screening details  Depression Screen PHQ 2 & 9 Depression Scale- Over the past 2 weeks, how often have you been bothered by any of the following problems? Little  interest or pleasure in doing things: 0 Feeling down, depressed, or hopeless (PHQ Adolescent also includes...irritable): 0 PHQ-2 Total Score: 0 Trouble falling or staying asleep, or sleeping too much: 0 Feeling tired or having little energy: 0 Poor appetite or overeating (PHQ Adolescent also includes...weight loss):  0 Feeling bad about yourself - or that you are a failure or have let yourself or your family down: 0 Trouble concentrating on things, such as reading the newspaper or watching television (PHQ Adolescent also includes...like school work): 0 Moving or speaking so slowly that other people could have noticed. Or the opposite - being so fidgety or restless that you have been moving around a lot more than usual: 0 Thoughts that you would be better off dead, or of hurting yourself in some way: 0 PHQ-9 Total Score: 0 If you checked off any problems, how difficult have these problems made it for you to do your work, take care of things at home, or get along with other people?: Not difficult at all     Goals Addressed             This Visit's Progress    family goal for patient is to be able to care for him at home until the end.       Patient is in palliative care and his daughter Sari Pear is his main caregiver. The goal is to keep him at home and care for him             Objective:    Today's Vitals   02/03/24 1534  Weight: 225 lb (102.1 kg)  Height: 5' 7 (1.702 m)   Body mass index is 35.24 kg/m.  Hearing/Vision screen Hearing Screening - Comments:: Per daughter (caregiver)  no hearing difficulties  Vision Screening - Comments:: Per daughter (caregiver) patient sees [facility] in [place], however, he hasn't been for his routine exam.  Immunizations and Health Maintenance Health Maintenance  Topic Date Due   COVID-19 Vaccine (1) Never done   DTaP/Tdap/Td (1 - Tdap) Never done   Pneumococcal Vaccine: 50+ Years (1 of 2 - PCV) Never done   Medicare Annual Wellness (AWV)  07/11/2022   Influenza Vaccine  04/12/2024 (Originally 08/14/2023)   Zoster Vaccines- Shingrix  Completed   Meningococcal B Vaccine  Aged Out        Assessment/Plan:  This is a routine wellness examination for [place].  Patient Care Team: Bacchus, Meade PEDLAR, FNP as PCP - General (Family Medicine) Dr  Willma Moats Optometrist, Abram, OD (Optometry) Duwaine Shona ORN, NP as Nurse Practitioner (Geriatric Medicine)  I have personally reviewed and noted the following in the patients chart:   Medical and social history Use of alcohol, tobacco or illicit drugs  Current medications and supplements including opioid prescriptions. Functional ability and status Nutritional status Physical activity Advanced directives List of other physicians Hospitalizations, surgeries, and ER visits in previous 12 months Vitals Screenings to include cognitive, depression, and falls Referrals and appointments  No orders of the defined types were placed in this encounter.  In addition, I have reviewed and discussed with patient certain preventive protocols, quality metrics, and best practice recommendations. A written personalized care plan for preventive services as well as general preventive health recommendations were provided to patient.   Mayari Matus, CMA   02/03/2024   Return February 03, 2025 at 3:10 pm, for In office Medicare Well Visit w  Wellness Nurse.  After Visit Summary: (MyChart) Due to this being a  telephonic visit, the after visit summary with patients personalized plan was offered to patient via MyChart   "

## 2024-02-05 NOTE — Progress Notes (Signed)
 Remote Loop Recorder Transmission

## 2024-02-28 ENCOUNTER — Encounter

## 2024-03-30 ENCOUNTER — Encounter

## 2024-05-02 ENCOUNTER — Ambulatory Visit

## 2024-05-06 ENCOUNTER — Ambulatory Visit

## 2024-05-06 ENCOUNTER — Ambulatory Visit: Admitting: Family Medicine

## 2024-06-02 ENCOUNTER — Ambulatory Visit

## 2024-07-04 ENCOUNTER — Ambulatory Visit

## 2024-08-04 ENCOUNTER — Ambulatory Visit

## 2024-09-05 ENCOUNTER — Ambulatory Visit

## 2024-10-06 ENCOUNTER — Ambulatory Visit

## 2024-11-07 ENCOUNTER — Ambulatory Visit

## 2024-12-08 ENCOUNTER — Ambulatory Visit

## 2025-02-03 ENCOUNTER — Ambulatory Visit: Payer: Self-pay
